# Patient Record
Sex: Female | Born: 1935 | Race: White | Hispanic: No | Smoking: Never smoker
Health system: Southern US, Community
[De-identification: ages and names within clinical notes are randomized; demographics above are authoritative.]

## PROBLEM LIST (undated history)

## (undated) ENCOUNTER — Emergency Department (HOSPITAL_COMMUNITY): Discharge status: Absent without leave | Payer: Medicare Other

## (undated) DIAGNOSIS — F329 Major depressive disorder, single episode, unspecified: Secondary | ICD-10-CM

## (undated) DIAGNOSIS — E119 Type 2 diabetes mellitus without complications: Secondary | ICD-10-CM

## (undated) DIAGNOSIS — I509 Heart failure, unspecified: Secondary | ICD-10-CM

## (undated) DIAGNOSIS — E7800 Pure hypercholesterolemia, unspecified: Secondary | ICD-10-CM

## (undated) DIAGNOSIS — N183 Chronic kidney disease, stage 3 unspecified: Secondary | ICD-10-CM

## (undated) DIAGNOSIS — IMO0002 Reserved for concepts with insufficient information to code with codable children: Secondary | ICD-10-CM

## (undated) DIAGNOSIS — I10 Essential (primary) hypertension: Secondary | ICD-10-CM

## (undated) DIAGNOSIS — K219 Gastro-esophageal reflux disease without esophagitis: Secondary | ICD-10-CM

## (undated) DIAGNOSIS — K8050 Calculus of bile duct without cholangitis or cholecystitis without obstruction: Secondary | ICD-10-CM

## (undated) DIAGNOSIS — I878 Other specified disorders of veins: Secondary | ICD-10-CM

## (undated) DIAGNOSIS — I5032 Chronic diastolic (congestive) heart failure: Secondary | ICD-10-CM

## (undated) DIAGNOSIS — N390 Urinary tract infection, site not specified: Secondary | ICD-10-CM

## (undated) DIAGNOSIS — F32A Depression, unspecified: Secondary | ICD-10-CM

## (undated) DIAGNOSIS — I050 Rheumatic mitral stenosis: Secondary | ICD-10-CM

## (undated) DIAGNOSIS — C50919 Malignant neoplasm of unspecified site of unspecified female breast: Secondary | ICD-10-CM

## (undated) DIAGNOSIS — M1990 Unspecified osteoarthritis, unspecified site: Secondary | ICD-10-CM

## (undated) DIAGNOSIS — J45909 Unspecified asthma, uncomplicated: Secondary | ICD-10-CM

## (undated) DIAGNOSIS — J189 Pneumonia, unspecified organism: Secondary | ICD-10-CM

## (undated) HISTORY — DX: Chronic diastolic (congestive) heart failure: I50.32

## (undated) HISTORY — DX: Chronic kidney disease, stage 3 unspecified: N18.30

## (undated) HISTORY — DX: Rheumatic mitral stenosis: I05.0

## (undated) HISTORY — PX: OTHER SURGICAL HISTORY: SHX169

## (undated) HISTORY — PX: REPLACEMENT TOTAL KNEE: SUR1224

## (undated) HISTORY — DX: Reserved for concepts with insufficient information to code with codable children: IMO0002

## (undated) HISTORY — DX: Unspecified asthma, uncomplicated: J45.909

## (undated) HISTORY — DX: Type 2 diabetes mellitus without complications: E11.9

## (undated) HISTORY — DX: Essential (primary) hypertension: I10

## (undated) HISTORY — DX: Other specified disorders of veins: I87.8

## (undated) HISTORY — PX: MASTECTOMY: SHX3

## (undated) HISTORY — PX: CAROTID ENDARTERECTOMY: SUR193

## (undated) HISTORY — PX: TOTAL HIP ARTHROPLASTY: SHX124

## (undated) HISTORY — DX: Chronic kidney disease, stage 3 (moderate): N18.3

---

## 2000-06-12 ENCOUNTER — Inpatient Hospital Stay (HOSPITAL_COMMUNITY)
Admission: RE | Admit: 2000-06-12 | Discharge: 2000-06-19 | Payer: Self-pay | Admitting: Physical Medicine & Rehabilitation

## 2000-11-26 ENCOUNTER — Encounter: Payer: Self-pay | Admitting: Obstetrics & Gynecology

## 2000-11-26 ENCOUNTER — Encounter (INDEPENDENT_AMBULATORY_CARE_PROVIDER_SITE_OTHER): Payer: Self-pay | Admitting: *Deleted

## 2000-11-26 ENCOUNTER — Encounter: Admission: RE | Admit: 2000-11-26 | Discharge: 2000-11-26 | Payer: Self-pay | Admitting: Obstetrics & Gynecology

## 2000-11-26 ENCOUNTER — Other Ambulatory Visit: Admission: RE | Admit: 2000-11-26 | Discharge: 2000-11-26 | Payer: Self-pay | Admitting: Obstetrics & Gynecology

## 2000-12-09 ENCOUNTER — Encounter: Payer: Self-pay | Admitting: General Surgery

## 2000-12-10 ENCOUNTER — Observation Stay (HOSPITAL_COMMUNITY): Admission: RE | Admit: 2000-12-10 | Discharge: 2000-12-11 | Payer: Self-pay | Admitting: General Surgery

## 2000-12-10 ENCOUNTER — Encounter (INDEPENDENT_AMBULATORY_CARE_PROVIDER_SITE_OTHER): Payer: Self-pay | Admitting: *Deleted

## 2000-12-30 ENCOUNTER — Ambulatory Visit (HOSPITAL_COMMUNITY): Admission: RE | Admit: 2000-12-30 | Discharge: 2000-12-30 | Payer: Self-pay | Admitting: Oncology

## 2000-12-30 ENCOUNTER — Encounter: Payer: Self-pay | Admitting: Oncology

## 2001-03-17 ENCOUNTER — Other Ambulatory Visit: Admission: RE | Admit: 2001-03-17 | Discharge: 2001-03-17 | Payer: Self-pay | Admitting: Family Medicine

## 2001-11-30 ENCOUNTER — Encounter: Admission: RE | Admit: 2001-11-30 | Discharge: 2001-11-30 | Payer: Self-pay | Admitting: *Deleted

## 2001-11-30 ENCOUNTER — Encounter: Payer: Self-pay | Admitting: *Deleted

## 2001-12-09 ENCOUNTER — Ambulatory Visit: Admission: RE | Admit: 2001-12-09 | Discharge: 2001-12-09 | Payer: Self-pay | Admitting: *Deleted

## 2002-03-01 ENCOUNTER — Encounter: Payer: Self-pay | Admitting: Family Medicine

## 2002-03-01 ENCOUNTER — Ambulatory Visit (HOSPITAL_COMMUNITY): Admission: RE | Admit: 2002-03-01 | Discharge: 2002-03-01 | Payer: Self-pay | Admitting: Family Medicine

## 2002-04-07 ENCOUNTER — Other Ambulatory Visit: Admission: RE | Admit: 2002-04-07 | Discharge: 2002-04-07 | Payer: Self-pay | Admitting: General Surgery

## 2003-05-10 ENCOUNTER — Encounter (HOSPITAL_COMMUNITY): Admission: RE | Admit: 2003-05-10 | Discharge: 2003-06-02 | Payer: Self-pay | Admitting: Oncology

## 2003-05-10 ENCOUNTER — Encounter: Admission: RE | Admit: 2003-05-10 | Discharge: 2003-05-10 | Payer: Self-pay | Admitting: Oncology

## 2003-06-21 ENCOUNTER — Encounter: Payer: Self-pay | Admitting: Family Medicine

## 2003-06-21 ENCOUNTER — Ambulatory Visit (HOSPITAL_COMMUNITY): Admission: RE | Admit: 2003-06-21 | Discharge: 2003-06-21 | Payer: Self-pay | Admitting: Family Medicine

## 2003-07-01 ENCOUNTER — Ambulatory Visit (HOSPITAL_COMMUNITY): Admission: RE | Admit: 2003-07-01 | Discharge: 2003-07-01 | Payer: Self-pay | Admitting: Family Medicine

## 2003-07-14 ENCOUNTER — Emergency Department (HOSPITAL_COMMUNITY): Admission: EM | Admit: 2003-07-14 | Discharge: 2003-07-14 | Payer: Self-pay | Admitting: Emergency Medicine

## 2003-07-25 ENCOUNTER — Ambulatory Visit (HOSPITAL_COMMUNITY): Admission: RE | Admit: 2003-07-25 | Discharge: 2003-07-25 | Payer: Self-pay | Admitting: Otolaryngology

## 2003-08-15 ENCOUNTER — Ambulatory Visit (HOSPITAL_COMMUNITY): Admission: RE | Admit: 2003-08-15 | Discharge: 2003-08-15 | Payer: Self-pay | Admitting: Otolaryngology

## 2003-09-07 ENCOUNTER — Encounter: Admission: RE | Admit: 2003-09-07 | Discharge: 2003-09-07 | Payer: Self-pay | Admitting: Family Medicine

## 2003-11-07 ENCOUNTER — Encounter (HOSPITAL_COMMUNITY): Admission: RE | Admit: 2003-11-07 | Discharge: 2003-12-07 | Payer: Self-pay | Admitting: Oncology

## 2003-11-07 ENCOUNTER — Encounter: Admission: RE | Admit: 2003-11-07 | Discharge: 2003-11-07 | Payer: Self-pay | Admitting: Oncology

## 2004-05-09 ENCOUNTER — Encounter (HOSPITAL_COMMUNITY): Admission: RE | Admit: 2004-05-09 | Discharge: 2004-06-01 | Payer: Self-pay | Admitting: Oncology

## 2004-05-09 ENCOUNTER — Encounter: Admission: RE | Admit: 2004-05-09 | Discharge: 2004-06-01 | Payer: Self-pay | Admitting: Oncology

## 2004-11-06 ENCOUNTER — Ambulatory Visit (HOSPITAL_COMMUNITY): Payer: Self-pay | Admitting: Oncology

## 2004-11-06 ENCOUNTER — Encounter: Admission: RE | Admit: 2004-11-06 | Discharge: 2004-11-06 | Payer: Self-pay | Admitting: Oncology

## 2004-11-06 ENCOUNTER — Encounter (HOSPITAL_COMMUNITY): Admission: RE | Admit: 2004-11-06 | Discharge: 2004-12-06 | Payer: Self-pay | Admitting: Oncology

## 2005-01-29 ENCOUNTER — Ambulatory Visit (HOSPITAL_COMMUNITY): Admission: RE | Admit: 2005-01-29 | Discharge: 2005-01-29 | Payer: Self-pay | Admitting: Family Medicine

## 2005-05-07 ENCOUNTER — Encounter (HOSPITAL_COMMUNITY): Admission: RE | Admit: 2005-05-07 | Discharge: 2005-05-31 | Payer: Self-pay | Admitting: Oncology

## 2005-05-07 ENCOUNTER — Ambulatory Visit (HOSPITAL_COMMUNITY): Payer: Self-pay | Admitting: Oncology

## 2005-05-07 ENCOUNTER — Encounter: Admission: RE | Admit: 2005-05-07 | Discharge: 2005-05-31 | Payer: Self-pay | Admitting: Oncology

## 2005-11-06 ENCOUNTER — Ambulatory Visit (HOSPITAL_COMMUNITY): Payer: Self-pay | Admitting: Oncology

## 2005-11-06 ENCOUNTER — Encounter (HOSPITAL_COMMUNITY): Admission: RE | Admit: 2005-11-06 | Discharge: 2005-12-06 | Payer: Self-pay | Admitting: Oncology

## 2005-11-06 ENCOUNTER — Encounter: Admission: RE | Admit: 2005-11-06 | Discharge: 2005-11-06 | Payer: Self-pay | Admitting: Oncology

## 2006-07-31 ENCOUNTER — Ambulatory Visit (HOSPITAL_COMMUNITY): Admission: RE | Admit: 2006-07-31 | Discharge: 2006-07-31 | Payer: Self-pay | Admitting: Family Medicine

## 2006-11-05 ENCOUNTER — Encounter (HOSPITAL_COMMUNITY): Admission: RE | Admit: 2006-11-05 | Discharge: 2006-12-05 | Payer: Self-pay | Admitting: Oncology

## 2006-11-05 ENCOUNTER — Ambulatory Visit (HOSPITAL_COMMUNITY): Payer: Self-pay | Admitting: Oncology

## 2007-02-06 ENCOUNTER — Ambulatory Visit: Payer: Self-pay | Admitting: Vascular Surgery

## 2007-02-06 ENCOUNTER — Ambulatory Visit (HOSPITAL_COMMUNITY): Admission: RE | Admit: 2007-02-06 | Discharge: 2007-02-06 | Payer: Self-pay | Admitting: Family Medicine

## 2007-02-07 ENCOUNTER — Inpatient Hospital Stay (HOSPITAL_COMMUNITY): Admission: EM | Admit: 2007-02-07 | Discharge: 2007-02-11 | Payer: Self-pay | Admitting: Emergency Medicine

## 2007-02-09 ENCOUNTER — Encounter: Payer: Self-pay | Admitting: Vascular Surgery

## 2007-03-13 ENCOUNTER — Ambulatory Visit: Payer: Self-pay | Admitting: Vascular Surgery

## 2007-04-03 ENCOUNTER — Ambulatory Visit: Payer: Self-pay | Admitting: Vascular Surgery

## 2007-04-03 ENCOUNTER — Encounter: Admission: RE | Admit: 2007-04-03 | Discharge: 2007-04-03 | Payer: Self-pay | Admitting: Vascular Surgery

## 2007-10-14 ENCOUNTER — Ambulatory Visit: Payer: Self-pay | Admitting: Vascular Surgery

## 2007-11-25 ENCOUNTER — Ambulatory Visit (HOSPITAL_COMMUNITY): Admission: RE | Admit: 2007-11-25 | Discharge: 2007-11-25 | Payer: Self-pay | Admitting: Family Medicine

## 2007-12-01 ENCOUNTER — Ambulatory Visit (HOSPITAL_COMMUNITY): Payer: Self-pay | Admitting: Oncology

## 2007-12-01 ENCOUNTER — Encounter (HOSPITAL_COMMUNITY): Admission: RE | Admit: 2007-12-01 | Discharge: 2007-12-31 | Payer: Self-pay | Admitting: Oncology

## 2008-03-18 ENCOUNTER — Ambulatory Visit (HOSPITAL_COMMUNITY): Admission: RE | Admit: 2008-03-18 | Discharge: 2008-03-18 | Payer: Self-pay | Admitting: Family Medicine

## 2008-10-12 ENCOUNTER — Ambulatory Visit: Payer: Self-pay | Admitting: Vascular Surgery

## 2009-01-23 ENCOUNTER — Ambulatory Visit (HOSPITAL_COMMUNITY): Payer: Self-pay | Admitting: Oncology

## 2009-04-19 ENCOUNTER — Ambulatory Visit: Payer: Self-pay | Admitting: Vascular Surgery

## 2009-08-24 ENCOUNTER — Ambulatory Visit (HOSPITAL_COMMUNITY): Admission: RE | Admit: 2009-08-24 | Discharge: 2009-08-24 | Payer: Self-pay | Admitting: Family Medicine

## 2009-09-22 ENCOUNTER — Encounter: Admission: RE | Admit: 2009-09-22 | Discharge: 2009-12-21 | Payer: Self-pay | Admitting: Ophthalmology

## 2010-01-22 ENCOUNTER — Encounter (HOSPITAL_COMMUNITY): Admission: RE | Admit: 2010-01-22 | Discharge: 2010-02-21 | Payer: Self-pay | Admitting: Oncology

## 2010-01-22 ENCOUNTER — Ambulatory Visit (HOSPITAL_COMMUNITY): Payer: Self-pay | Admitting: Oncology

## 2010-06-05 ENCOUNTER — Ambulatory Visit (HOSPITAL_COMMUNITY)
Admission: RE | Admit: 2010-06-05 | Discharge: 2010-06-05 | Payer: Self-pay | Source: Home / Self Care | Admitting: Family Medicine

## 2010-09-05 ENCOUNTER — Inpatient Hospital Stay (HOSPITAL_COMMUNITY)
Admission: AD | Admit: 2010-09-05 | Discharge: 2010-09-12 | Payer: Self-pay | Source: Home / Self Care | Attending: Family Medicine | Admitting: Family Medicine

## 2010-09-05 LAB — COMPREHENSIVE METABOLIC PANEL
ALT: 13 U/L (ref 0–35)
AST: 23 U/L (ref 0–37)
Albumin: 3.6 g/dL (ref 3.5–5.2)
Alkaline Phosphatase: 84 U/L (ref 39–117)
BUN: 29 mg/dL — ABNORMAL HIGH (ref 6–23)
CO2: 29 mEq/L (ref 19–32)
Calcium: 9.4 mg/dL (ref 8.4–10.5)
Chloride: 103 mEq/L (ref 96–112)
Creatinine, Ser: 1.26 mg/dL — ABNORMAL HIGH (ref 0.4–1.2)
GFR calc Af Amer: 49 mL/min — ABNORMAL LOW (ref >60)
GFR calc non Af Amer: 40 mL/min — ABNORMAL LOW (ref >60)
Glucose, Bld: 123 mg/dL — ABNORMAL HIGH (ref 70–99)
Potassium: 4.8 mEq/L (ref 3.5–5.1)
Sodium: 141 mEq/L (ref 135–145)
Total Bilirubin: 0.6 mg/dL (ref 0.3–1.2)
Total Protein: 7.1 g/dL (ref 6.0–8.3)

## 2010-09-05 LAB — URINALYSIS, ROUTINE W REFLEX MICROSCOPIC
Bilirubin Urine: NEGATIVE
Hemoglobin, Urine: NEGATIVE
Ketones, ur: NEGATIVE mg/dL
Leukocytes, UA: NEGATIVE
Nitrite: POSITIVE — AB
Protein, ur: NEGATIVE mg/dL
Specific Gravity, Urine: 1.01 (ref 1.005–1.030)
Urine Glucose, Fasting: NEGATIVE mg/dL
Urobilinogen, UA: 0.2 mg/dL (ref 0.0–1.0)
pH: 7 (ref 5.0–8.0)

## 2010-09-05 LAB — CBC
HCT: 34 % — ABNORMAL LOW (ref 36.0–46.0)
Hemoglobin: 11.3 g/dL — ABNORMAL LOW (ref 12.0–15.0)
MCH: 29.4 pg (ref 26.0–34.0)
MCHC: 33.2 g/dL (ref 30.0–36.0)
MCV: 88.5 fL (ref 78.0–100.0)
Platelets: 218 10*3/uL (ref 150–400)
RBC: 3.84 MIL/uL — ABNORMAL LOW (ref 3.87–5.11)
RDW: 14.5 % (ref 11.5–15.5)
WBC: 9.2 10*3/uL (ref 4.0–10.5)

## 2010-09-05 LAB — URINE MICROSCOPIC-ADD ON

## 2010-09-05 LAB — DIFFERENTIAL
Basophils Absolute: 0.1 10*3/uL (ref 0.0–0.1)
Basophils Relative: 1 % (ref 0–1)
Eosinophils Absolute: 0.5 10*3/uL (ref 0.0–0.7)
Eosinophils Relative: 6 % — ABNORMAL HIGH (ref 0–5)
Lymphocytes Relative: 20 % (ref 12–46)
Lymphs Abs: 1.9 10*3/uL (ref 0.7–4.0)
Monocytes Absolute: 0.9 10*3/uL (ref 0.1–1.0)
Monocytes Relative: 9 % (ref 3–12)
Neutro Abs: 5.9 10*3/uL (ref 1.7–7.7)
Neutrophils Relative %: 64 % (ref 43–77)

## 2010-09-06 ENCOUNTER — Other Ambulatory Visit: Payer: Self-pay | Admitting: Family Medicine

## 2010-09-06 LAB — BASIC METABOLIC PANEL
BUN: 28 mg/dL — ABNORMAL HIGH (ref 6–23)
CO2: 26 mEq/L (ref 19–32)
Calcium: 8.9 mg/dL (ref 8.4–10.5)
Chloride: 105 mEq/L (ref 96–112)
Creatinine, Ser: 1.28 mg/dL — ABNORMAL HIGH (ref 0.4–1.2)
GFR calc Af Amer: 48 mL/min — ABNORMAL LOW (ref >60)
GFR calc non Af Amer: 40 mL/min — ABNORMAL LOW (ref >60)
Glucose, Bld: 129 mg/dL — ABNORMAL HIGH (ref 70–99)
Potassium: 4 mEq/L (ref 3.5–5.1)
Sodium: 139 mEq/L (ref 135–145)

## 2010-09-06 LAB — COMPREHENSIVE METABOLIC PANEL
ALT: 14 U/L (ref 0–35)
AST: 19 U/L (ref 0–37)
Albumin: 3.3 g/dL — ABNORMAL LOW (ref 3.5–5.2)
Alkaline Phosphatase: 67 U/L (ref 39–117)
BUN: 30 mg/dL — ABNORMAL HIGH (ref 6–23)
CO2: 26 mEq/L (ref 19–32)
Calcium: 8.9 mg/dL (ref 8.4–10.5)
Chloride: 105 mEq/L (ref 96–112)
Creatinine, Ser: 1.34 mg/dL — ABNORMAL HIGH (ref 0.4–1.2)
GFR calc Af Amer: 45 mL/min — ABNORMAL LOW (ref >60)
GFR calc non Af Amer: 38 mL/min — ABNORMAL LOW (ref >60)
Glucose, Bld: 118 mg/dL — ABNORMAL HIGH (ref 70–99)
Potassium: 4 mEq/L (ref 3.5–5.1)
Sodium: 141 mEq/L (ref 135–145)
Total Bilirubin: 0.9 mg/dL (ref 0.3–1.2)
Total Protein: 6.4 g/dL (ref 6.0–8.3)

## 2010-09-06 LAB — BRAIN NATRIURETIC PEPTIDE: Pro B Natriuretic peptide (BNP): 80.3 pg/mL (ref 0.0–100.0)

## 2010-09-07 LAB — BASIC METABOLIC PANEL
BUN: 26 mg/dL — ABNORMAL HIGH (ref 6–23)
CO2: 27 mEq/L (ref 19–32)
Calcium: 8.8 mg/dL (ref 8.4–10.5)
Chloride: 103 mEq/L (ref 96–112)
Creatinine, Ser: 1.21 mg/dL — ABNORMAL HIGH (ref 0.4–1.2)
GFR calc Af Amer: 51 mL/min — ABNORMAL LOW (ref >60)
GFR calc non Af Amer: 42 mL/min — ABNORMAL LOW (ref >60)
Glucose, Bld: 146 mg/dL — ABNORMAL HIGH (ref 70–99)
Potassium: 3.7 mEq/L (ref 3.5–5.1)
Sodium: 139 mEq/L (ref 135–145)

## 2010-09-07 LAB — URINE CULTURE
Colony Count: >=100000
Culture  Setup Time: 201201041340

## 2010-09-17 LAB — URINE CULTURE
Colony Count: >=100000
Colony Count: >=100000
Culture  Setup Time: 201201060146
Culture  Setup Time: 201201082212
Special Requests: NEGATIVE
Special Requests: NEGATIVE

## 2010-09-17 LAB — BASIC METABOLIC PANEL
BUN: 26 mg/dL — ABNORMAL HIGH (ref 6–23)
BUN: 38 mg/dL — ABNORMAL HIGH (ref 6–23)
BUN: 43 mg/dL — ABNORMAL HIGH (ref 6–23)
CO2: 28 mEq/L (ref 19–32)
CO2: 25 mEq/L (ref 19–32)
CO2: 26 mEq/L (ref 19–32)
Calcium: 8.7 mg/dL (ref 8.4–10.5)
Calcium: 8.5 mg/dL (ref 8.4–10.5)
Calcium: 8 mg/dL — ABNORMAL LOW (ref 8.4–10.5)
Chloride: 102 mEq/L (ref 96–112)
Chloride: 103 mEq/L (ref 96–112)
Chloride: 101 mEq/L (ref 96–112)
Creatinine, Ser: 1.25 mg/dL — ABNORMAL HIGH (ref 0.4–1.2)
Creatinine, Ser: 1.49 mg/dL — ABNORMAL HIGH (ref 0.4–1.2)
Creatinine, Ser: 1.62 mg/dL — ABNORMAL HIGH (ref 0.4–1.2)
GFR calc Af Amer: 49 mL/min — ABNORMAL LOW (ref >60)
GFR calc Af Amer: 40 mL/min — ABNORMAL LOW (ref >60)
GFR calc Af Amer: 36 mL/min — ABNORMAL LOW (ref >60)
GFR calc non Af Amer: 41 mL/min — ABNORMAL LOW (ref >60)
GFR calc non Af Amer: 33 mL/min — ABNORMAL LOW (ref >60)
GFR calc non Af Amer: 30 mL/min — ABNORMAL LOW (ref >60)
Glucose, Bld: 158 mg/dL — ABNORMAL HIGH (ref 70–99)
Glucose, Bld: 142 mg/dL — ABNORMAL HIGH (ref 70–99)
Glucose, Bld: 104 mg/dL — ABNORMAL HIGH (ref 70–99)
Potassium: 3.9 mEq/L (ref 3.5–5.1)
Potassium: 3.8 mEq/L (ref 3.5–5.1)
Potassium: 3.5 mEq/L (ref 3.5–5.1)
Sodium: 138 mEq/L (ref 135–145)
Sodium: 138 mEq/L (ref 135–145)
Sodium: 135 mEq/L (ref 135–145)

## 2010-09-17 LAB — URINALYSIS, ROUTINE W REFLEX MICROSCOPIC
Bilirubin Urine: NEGATIVE
Ketones, ur: NEGATIVE mg/dL
Leukocytes, UA: NEGATIVE
Nitrite: NEGATIVE
Protein, ur: NEGATIVE mg/dL
Specific Gravity, Urine: 1.01 (ref 1.005–1.030)
Urine Glucose, Fasting: NEGATIVE mg/dL
Urobilinogen, UA: 0.2 mg/dL (ref 0.0–1.0)
pH: 8 (ref 5.0–8.0)

## 2010-09-17 LAB — CULTURE, BLOOD (ROUTINE X 2)
Culture: NO GROWTH
Culture: NO GROWTH

## 2010-09-17 LAB — STOOL CULTURE

## 2010-09-17 LAB — URINE MICROSCOPIC-ADD ON

## 2010-09-17 LAB — FECAL LACTOFERRIN, QUANT: Fecal Lactoferrin: POSITIVE

## 2010-09-17 LAB — CLOSTRIDIUM DIFFICILE BY PCR

## 2010-11-19 LAB — COMPREHENSIVE METABOLIC PANEL
Alkaline Phosphatase: 75 U/L (ref 39–117)
BUN: 37 mg/dL — ABNORMAL HIGH (ref 6–23)
Chloride: 104 mEq/L (ref 96–112)
Glucose, Bld: 105 mg/dL — ABNORMAL HIGH (ref 70–99)
Potassium: 4 mEq/L (ref 3.5–5.1)
Total Bilirubin: 0.9 mg/dL (ref 0.3–1.2)

## 2010-11-19 LAB — TSH: TSH: 3.41 u[IU]/mL (ref 0.350–4.500)

## 2010-11-19 LAB — DIFFERENTIAL
Basophils Absolute: 0.1 10*3/uL (ref 0.0–0.1)
Basophils Relative: 1 % (ref 0–1)
Neutro Abs: 5.7 10*3/uL (ref 1.7–7.7)
Neutrophils Relative %: 61 % (ref 43–77)

## 2010-11-19 LAB — CBC
HCT: 35.2 % — ABNORMAL LOW (ref 36.0–46.0)
Hemoglobin: 12.1 g/dL (ref 12.0–15.0)
MCV: 88.3 fL (ref 78.0–100.0)
RDW: 14.7 % (ref 11.5–15.5)

## 2010-11-19 LAB — SEDIMENTATION RATE: Sed Rate: 38 mm/hr — ABNORMAL HIGH (ref 0–22)

## 2011-01-15 NOTE — Assessment & Plan Note (Signed)
OFFICE VISIT   HOANG, REICH  DOB:  [birth year]                                       03/13/2007  CHART#:[id number]   Ms. Cragun returns for followup today after right carotid  endarterectomy on February 07, 2007. Since that time her daughter reports  that she has been very lethargic and sleepy during the day. She  sometimes has a dazed look on her face. She otherwise has no focal  neurologic findings. She has no weakness, numbness in her arms or legs.  She has had no episodes of amaurosis. She has had some leg edema and has  been placed on Lasix by Dr. Renard Matter.   On conversation with her today she is somewhat slow to respond to  commands on occasion and seems like things take awhile to register with  her.   On physical exam, blood pressure is 184/72, heart rate is 54 and  regular. Neck incision is well-healed. She has 2 + carotid pulses  without bruits. Upper extremity and lower extremity have 5/5 motor  strength bilaterally. Sensation is intact. Tongue is midline and other  cranial nerves appear intact as well. Patient is alert and oriented  otherwise. Overall Ms. Shaddock seems to have normal motor and sensory  function. However her cognitive function does seem a little bit slow.  Hopefully this will continue to improve with time, as it seems to have  improved at least over the last few weeks. She will follow up with me in  2 weeks time. We will obtain a CT scan of the  head at that time to make sure that she has had no new stroke. I am  doubtful that she has since she really does not have focal deficits and  seems to have more a global slowness.   Janetta Hora. Fields, MD  Electronically Signed   CEF/MEDQ  D:  03/13/2007  T:  03/16/2007  Job:  [id number]   cc:   Ramon Dredge L. Juanetta Gosling, M.D.  Angus G. Renard Matter, MD  Cassell Clement, M.D.

## 2011-01-15 NOTE — Assessment & Plan Note (Signed)
OFFICE VISIT   [name], [name]  DOB:  10-01-1921                                       10/12/2008  XBMWU#:[id number]   The patient returns for followup today.  She underwent right carotid  endarterectomy in June of 2008.  She was last seen February of 2009.  She has completely recovered from her carotid endarterectomy.  She  denies any symptoms of TIA, amaurosis or stroke.  She continues to take  81 mg of aspirin once daily.  Her atherosclerotic risk factors include  elevated cholesterol and hypertension.   PHYSICAL EXAM:  Today blood pressure is 172/84 in the left arm, 174/90  in the right arm, pulse is 88 and regular.  HEENT:  Unremarkable.  Neck:  Has 2+ carotid pulses with bilateral carotid bruits.  Chest:  Clear to  auscultation.  Heart:  Regular rate and rhythm.  Abdomen:  Obese, soft,  nontender, nondistended with no masses.  Extremities:  She has 2+ radial  pulses bilaterally.  She has no lower extremity edema.   She had a carotid duplex exam today which showed the right carotid  endarterectomy is widely patent with no restenosis.  She has a 40-60%  left internal carotid artery stenosis.  This is essentially unchanged  from February of 2009.   Overall the patient is doing well.  She will have followup carotid  duplex exam in 1 year's time.  She will continue to take the aspirin.  She will continue to monitor her hypertension and elevated cholesterol.   Janetta Hora. Fields, MD  Electronically Signed   CEF/MEDQ  D:  10/12/2008  T:  10/13/2008  Job:  [id number]   cc:   Angus G. Renard Matter, MD  Oneal Deputy Juanetta Gosling, M.D.  Cassell Clement, M.D.

## 2011-01-15 NOTE — Group Therapy Note (Signed)
NAMELONDYN, HOTARD           ACCOUNT NO.:  [id number]   MEDICAL RECORD NO.:  [id number]          PATIENT TYPE:  OUT   LOCATION:  RAD                           FACILITY:  [facility]   PHYSICIAN:  Edward L. Juanetta Gosling, M.D.DATE OF BIRTH:  [birth year]   DATE OF PROCEDURE:  DATE OF DISCHARGE:                                 PROGRESS NOTE   Patient of Dr. Renard Matter.  Ms. Dillenbeck is an 77 year old who had an  episode of amaurosis fugax on Monday of this week, that would be the  second.  She call Dr. Renard Matter' offices, he referred her to Dr. Alden Hipp,  the ophthalmologist in [place], who saw Ms. Dullea and felt that  she had amaurosis fugax, and referred her back to Dr. Renard Matter.  She saw  Dr. Renard Matter today, was placed on Plavix, and had a carotid study done.  Her carotid study is very abnormal.  I received a call from Dr. Rosalie Gums in the radiology department about his carotid study.  I went to  the emergency room to discuss this with Ms. Galas.  Her studies  showed significant stenosis of the right internal carotid 80-99%, left  internal 60-80% retrograde flow in the left vertebral.  Ms. Villalona  has had no symptoms since Monday and she and her husband want to go  home.  I discussed her situation with Dr. Fabienne Bruns of [facility] and we  discussed the possibility of transferring her directly to [facility] but  he felt that since she has had no symptoms and is on Plavix that she can  go home to return here immediately if she has any more symptoms and be  admitted to [facility] in the morning.  I discussed overnight admission with  the patient and her husband and they do not want to do that.  Considering that, at this point the plan is to have her continue Plavix,  she just started today; go to [facility] in the morning to be admitted  by Dr. Darrick Penna with potential for surgery soon; return here if there is  on any more problems.  Her physical examination today shows that she is  neurologically intact.  She does have a history of hypertension and her  family history is positive for hardening of the arteries.  Her husband  also says that she has had some more problem with memory recently.      Edward L. Juanetta Gosling, M.D.  Electronically Signed     ELH/MEDQ  D:  02/06/2007  T:  02/07/2007  Job:  [id number]

## 2011-01-15 NOTE — Discharge Summary (Signed)
NAMEBRIER, FIREBAUGH           ACCOUNT NO.:  [id number]   MEDICAL RECORD NO.:  [id number]          PATIENT TYPE:  INP   LOCATION:  2035                         FACILITY:  [facility]   PHYSICIAN:  Janetta Hora. Fields, MD  DATE OF BIRTH:  1921-09-30   DATE OF ADMISSION:  02/07/2007  DATE OF DISCHARGE:                               DISCHARGE SUMMARY   ANTICIPATED DATE OF DISCHARGE:  June 11 or February 12, 2007.   ADMISSION DIAGNOSIS:  Symptomatic right internal carotid artery  stenosis.   DISCHARGE/SECONDARY DIAGNOSES:  1. Symptomatic right internal carotid artery stenosis.  Status post      right carotid endarterectomy.  2. History of breast cancer.  Status post left mastectomy.  3. Gastroesophageal reflux disease.  4. History of left total hip replacement.  5. Hyperlipidemia.  6. Hypertension.   ALLERGIES:  ERYTHROMYCIN, CELECOXIB, AND PENICILLIN.  REQUIRES  PREMEDICATION FOR IV DYE SECONDARY TO INTOLERANCE DUE TO HEADACHES.   PROCEDURES:  February 09, 2007 right carotid endarterectomy with Dacron Patch  angioplasty by Dr. Fabienne Bruns.   BRIEF HISTORY:  Miss Plazola had complaints of right eye neurosis x5  minutes on February 02, 2007.  It appeared as shadows in her right eye and  then cleared.  She had no prior history of CVA or TIA.  As part of her  workup, she underwent a carotid duplex which showed greater than 80%  right internal carotid artery stenosis and 60 to 80% left carotid artery  stenosis with left vertebral flow retrograde.  Apparently when she first  had her symptoms of amaurosis fugax, she called Dr. Renard Matter' office.  He  referred her to Dr. Alden Hipp, an ophthalmologist, who felt this was not a  ophthalmology issue, but rather amaurosis fugax, and referred her back  to Dr. Renard Matter.  She was placed on Plavix and had a carotid study with  results as discussed above.  Dr. Juanetta Gosling apparently was called with the  results of her abnormal carotid study on February 06, 2007 and saw her  in the  emergency department.  She was having no symptoms at that time.  He  called Dr. Fabienne Bruns who discussed the possibility of transferring  her to [facility].  However, Dr. Darrick Penna felt that since she had  no symptoms and was on Plavix, that she could go home and return to the  emergency department immediately if symptoms reoccurred.  Otherwise, he  will plan on admitting her to [facility] the following day on  February 07, 2007.   HOSPITAL COURSE:  Miss Birdwell was admitted to [facility] on  February 07, 2007.  Dr. Darrick Penna ordered a CT angio to further delineate her  anatomy.  She did request premedication as she had a headache after a  prior CT angio.  The results showed 85% proximal right internal carotid  artery stenosis, 75 to 80% left internal carotid artery stenosis.  Radiology results as expected.  A slow reducing lesion in the proximal  right subclavian with heavily calcified plaque, although the exact  degree of stenosis was not established.  Of note, the  patient was placed  on heparin drip on admission and continued this through midnight before  surgery.  Based on the findings, Dr. Darrick Penna did recommend that she  undergo a right carotid endarterectomy.  The patient agreed to proceed  and on February 09, 2007 she was taken to the operating room.  Postoperatively she was extubated and neurologically intact, and after a  short stay in recovery she was transferred to step-down unit 3300 where  she remained for the first 24 hours.  She remained stable overnight,  although it appears she initially received some dopamine while in the  recovery unit.  Blood pressure on morning rounds was 107/26, she was  afebrile, heart rate was in the 70s and 80s and in sinus rhythm, oxygen  saturations were 92% on room air.  She denied visual changes and  dysphagia.  She was quite drowsy and weak, and subsequently felt  unsteady initially to do much mobilization.  She was able  to answer  questions appropriately and other than being drowsy, was felt  neurologically intact.   EXAMINATION:  HEART:  Regular rate and rhythm.  LUNGS:  She has faint wheezes at the bases, but were otherwise clear.  ABDOMINAL EXAM:  Benign.   Incision was clean, dry, and intact without evidence of hematoma.  Because the patient was lethargic, it was felt she should remain  hospitalized for at least one additional day.  Of note, she had reported  that she had little sleep that night, and it was felt that this was a  contributing factor of her drowsiness as well as her pain medication.  Because the nursing staff felt she was unsteady, physical therapy  consult was also ordered, as well as a walker for home.  She was started  on heparin subcutaneously for DVT prophylaxis.  If the patient can  remain stable and drowsiness has resolved, and is making progress with  mobility, they anticipate she will be discharged home in the next 24 to  48 hours, June 11 or February 12, 2007.  We will consider home health  physical therapy consult pending PT recommendations.   DISCHARGE MEDICATIONS:  1. Lipitor 20 mg one-half tablet p.o. daily.  2. Tekturna 150 mg daily.  3. Nexium 40 mg b.i.d.  4. Lorazepam 0.5 mg every fours hours p.r.n.  5. Diovan 320/25 mg daily.  6. Metoprolol 100 mg daily.  7. Paxil 20 mg daily.  8. Mucinex b.i.d.  9. Tylox 1 to 2 tablets p.o. every four hours p.r.n. pain.   DISCHARGE INSTRUCTIONS:  She is to continue a heart-healthy diet.  Increase her activity slowly.  Avoid driving or heavy lifting for the  next 3 weeks.  She may shower and clean her incision gently with soap  and water.  She is to call if she develops a fever greater than 101,  redness or drainage from her incision site.  She will follow up with Dr.  Darrick Penna in approximately 2 to 3 weeks.  Our office will contact her regarding specific appointment date and time.  She will also be given  instructions whether  she should resume her Plavix prior to discharge.   LABORATORY DATA:  Postop labs show a white blood count of 14.1,  hemoglobin 10.8, hematocrit 31.9, platelet count 189.  Sodium 132,  potassium 4.1, BUN 19, creatinine 1.07, blood glucose 154.      Jerold Coombe, P.A.      Janetta Hora. Fields, MD  Electronically Signed  AWZ/MEDQ  D:  02/10/2007  T:  02/10/2007  Job:  [id number]   cc:   Ramon Dredge L. Juanetta Gosling, M.D.  Angus G. Renard Matter, MD  Cassell Clement, M.D.

## 2011-01-15 NOTE — Assessment & Plan Note (Signed)
OFFICE VISIT   [name], [name]  DOB:  03/15/1922                                       10/14/2007  ZOXWR#:[id number]   The patient returns for followup today.  She underwent right carotid  endarterectomy in June 2008.  She was last seen in August 2008.  She had  some lethargy postoperatively, but this has now all completely resolved.   EXAM:  She is alert and oriented x3.  She has had no episodes of  neurological dysfunction of her upper or lower extremities.  Blood  pressure today is 158/68, pulse 62 and regular.  She has no carotid  bruits.  She has a well-healed right neck incision.  Chest is clear to  auscultation.  Cardiac exam is regular rate and rhythm without murmur.  Upper extremity and lower extremity motor strength is 5/5 bilaterally.  She continues to take aspirin.  She was also apparently recently placed  on Plavix for what sounds like some lower extremity occlusive disease.  On questioning her regarding claudication symptoms, she essentially has  none or very mild symptoms.  I do not believe this warrants further  workup currently.  She will see me again in 1-year's time for repeat  carotid duplex exam or sooner if she develops any neurologic symptoms.   Janetta Hora. Fields, MD  Electronically Signed   CEF/MEDQ  D:  10/14/2007  T:  10/16/2007  Job:  [id number]   cc:   Ramon Dredge L. Juanetta Gosling, M.D.  Angus G. Renard Matter, MD  Cassell Clement, M.D.

## 2011-01-15 NOTE — Procedures (Signed)
CAROTID DUPLEX EXAM   INDICATION:  Followup right carotid endarterectomy.   HISTORY:  Diabetes:  No.  Cardiac:  No.  Hypertension:  Yes.  Smoking:  Previous.  Previous Surgery:  Right carotid endarterectomy on 02/07/2007.  CV History:  Currently asymptomatic.  Amaurosis Fugax No, Paresthesias No, Hemiparesis No                                       RIGHT             LEFT  Brachial systolic pressure:         170               166  Brachial Doppler waveforms:         Normal            Normal  Vertebral direction of flow:        Antegrade         Antegrade  DUPLEX VELOCITIES (cm/sec)  CCA peak systolic                   63                85  ECA peak systolic                   86                74  ICA peak systolic                   101               165  ICA end diastolic                   11                26  PLAQUE MORPHOLOGY:                                    Mixed  PLAQUE AMOUNT:                      None              Moderate  PLAQUE LOCATION:                                      ICA   IMPRESSION:  1. Patent right carotid endarterectomy site with no evidence of right      internal carotid artery stenosis.  2. 40-59% stenosis of the left internal carotid artery.  3. No significant change noted when compared to the previous exam on      10/14/2007.   ___________________________________________  Janetta Hora. Fields, MD   CH/MEDQ  D:  10/13/2008  T:  10/13/2008  Job:  [id number]

## 2011-01-15 NOTE — Assessment & Plan Note (Signed)
OFFICE VISIT   [name], [name]  DOB:  Feb 22, 1922                                       04/19/2009  [id number]#:[id number]   The patient is an 77 year old female who returns for followup today.  She previously underwent right carotid endarterectomy in June of 2008.  She returns to the office today with complaints of decreased vision in  her left eye.  The patient is hard of hearing and had some lack of  understanding of why her left visual acuity was decreasing at a recent  eye doctor appointment.  Therefore, since she had previously had  amaurosis type symptoms for her right carotid endarterectomy her  daughter thought it was wise to bring her in for evaluation.   On questioning of the patient, the daughter and the husband there have  been no obvious episodes of amaurosis.  According to the patient she has  had no neurologic symptoms or episodes of transient visual loss.  She  does report that her left eye visual acuity has decreased over time but  this has not been very acute in onset.   Atherosclerotic risk factors include hyperlipidemia, hypertension and  age.   PHYSICAL EXAM:  Today blood pressure is 187/92 in the left arm, 192/75  in the right arm, pulse is 72 and regular.  HEENT:  Unremarkable.  On  objective testing of the eyes she has at least gross visual acuity in  both eyes and is able to identify large objects.  Extraocular movements  were intact.  Neck:  Has 2+ carotid pulses without bruit.  She has a  well-healed neck scar from previous carotid endarterectomy on the right  side.  Chest:  Clear to auscultation.  Heart:  Regular rate and rhythm  without murmur.  She has 2+ radial pulses bilaterally.   She had a carotid duplex scan today which showed a widely patent carotid  endarterectomy site on the right side.  She has a 40-60% carotid  stenosis on the left side which is essentially unchanged from February  of 2010.  We were able to get a  fax report of her recent eye evaluation  at [facility] dated 12/06/2008.  This showed that she does have  some decreased visual acuity on the left side.  However, there was no  obvious mention of atherosclerotic or Hollenhorst type plaques and this  appeared to mainly be secondary to macular scarring.   In summary, the patient has a moderate left internal carotid artery  stenosis.  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  She does not have symptoms  consistent with TIA or amaurosis.  I believe the best management for  this is continued observation with serial ultrasound as well as  continued management of her atherosclerotic risk factors.  This was all  discussed with the family at length.  They will call Dr. Nile Riggs if they  have further concerns regarding her left eye.  They will follow up with  [place] in our continued carotid surveillance protocol.   Janetta Hora. Fields, MD  Electronically Signed   CEF/MEDQ  D:  04/19/2009  T:  04/20/2009  Job:  [id number]   cc:   Angus G. Renard Matter, MD  Oneal Deputy Juanetta Gosling, M.D.  Cassell Clement, M.D.

## 2011-01-15 NOTE — Procedures (Signed)
CAROTID DUPLEX EXAM   INDICATION:  Follow-up evaluation of known carotid artery disease.   HISTORY:  Diabetes:  No.  Cardiac:  No.  Hypertension:  Yes.  Smoking:  Remote history of tobacco use.  Previous Surgery:  Right carotid endarterectomy on 02/07/07.  CV History:  Patient had episodes of amaurosis fugax prior to her  endarterectomy but not since.  Amaurosis Fugax No, Paresthesias No, Hemiparesis No                                       RIGHT             LEFT  Brachial systolic pressure:         164               160  Brachial Doppler waveforms:         Triphasic         Triphasic  Vertebral direction of flow:        Antegrade         Antegrade  DUPLEX VELOCITIES (cm/sec)  CCA peak systolic                   81                94  ECA peak systolic                   102               71  ICA peak systolic                   117               159  ICA end diastolic                   16                31  PLAQUE MORPHOLOGY:                  None              Calcified  PLAQUE AMOUNT:                      None              Moderate  PLAQUE LOCATION:                    None              Proximal ICA   IMPRESSION:  1. No right internal carotid artery stenosis, status post      endarterectomy.  2. 40-59% left internal carotid artery stenosis.   ___________________________________________  Janetta Hora Fields, MD   MC/MEDQ  D:  10/14/2007  T:  10/15/2007  Job:  [id number]

## 2011-01-15 NOTE — Procedures (Signed)
CAROTID DUPLEX EXAM   INDICATION:  Followup carotid artery disease.   HISTORY:  Diabetes:  No.  Cardiac:  No.  Hypertension:  Yes.  Smoking:  Previous.  Previous Surgery:  Right CEA 02/07/2007.  CV History:  Left eye blindness.  Amaurosis Fugax No, Paresthesias No, Hemiparesis No                                       RIGHT             LEFT  Brachial systolic pressure:         224               220  Brachial Doppler waveforms:         WNL               WNL  Vertebral direction of flow:        Antegrade         Antegrade  DUPLEX VELOCITIES (cm/sec)  CCA peak systolic                   85                93  ECA peak systolic                   101               139  ICA peak systolic                   98                155  ICA end diastolic                   18                32  PLAQUE MORPHOLOGY:                  N/A               Mixed  PLAQUE AMOUNT:                      N/A               Moderate  PLAQUE LOCATION:                    N/A               ICA/bifurcation   IMPRESSION:  1. Right ICA shows no evidence of restenosis status post CEA.  2. Left ICA shows evidence of 40-59% stenosis.  3. No significant changes from previous study.   ___________________________________________  Janetta Hora Fields, MD   AS/MEDQ  D:  04/19/2009  T:  04/19/2009  Job:  [id number]

## 2011-01-15 NOTE — Assessment & Plan Note (Signed)
OFFICE VISIT   KOREN, PLYLER  DOB:  February 14, 1922                                       04/03/2007  CHART#:[id number]   Ms. Roehrig returns for follow up today.  She underwent right carotid  endarterectomy on February 07, 2007.  She was last seen in the office on February 11, 2007 and was very lethargic at that time.  She also had fairly  severe leg edema.  Today on evaluation, she is much more alert and  conversant.  Her daughter states that she has been more alert over the  last few weeks.  Her head CT performed today showed evidence of atrophy,  but no evidence of infarction.  She has no asymmetrical weakness,  slurred speech or facial asymmetric.  Blood pressure today was 142/57,  heart rate 69 and regular.  Neck incision is well healed.  Overall, I  believe Ms. Snowden is making some gradual progress towards recovery.  She probably has some underlying component of dementia, and this may  have been exacerbated some by her general anesthetic.  She does have  fairly significant cerebral atrophy on her CT scan.  I discussed all of  these findings with the patient's daughter today.  Hopefully, she will  continue to recover some with time.  She will follow up with me for a  repeat carotid duplex exam in December of 2008.  She will continue to  take aspirin once daily in the meantime.   Janetta Hora. Fields, MD  Electronically Signed   CEF/MEDQ  D:  04/05/2007  T:  04/06/2007  Job:  [id number]

## 2011-01-15 NOTE — Op Note (Signed)
[name], [name]           ACCOUNT NO.:  [id number]   MEDICAL RECORD NO.:  [id number]          PATIENT TYPE:  INP   LOCATION:  2035                         FACILITY:  [facility]   PHYSICIAN:  Janetta Hora. Fields, MD  DATE OF BIRTH:  Oct 12, 1921   DATE OF PROCEDURE:  02/09/2007  DATE OF DISCHARGE:  02/11/2007                               OPERATIVE REPORT   PROCEDURE:  Right carotid endarterectomy.   PREOPERATIVE DIAGNOSIS:  Symptomatic right internal carotid artery  stenosis.   POSTOPERATIVE DIAGNOSIS:  Symptomatic right internal carotid artery  stenosis.   ANESTHESIA:  General.   ASSISTANT:  Coral Ceo, PA-C.   OPERATIVE FINDINGS:  1. Very high right carotid bifurcation.  2. Dacron patch.  3. 10-French shunt.   OPERATIVE DETAILS:  After obtaining informed consent, the patient was  taken to the operating room.  The patient was placed in the supine  position on the operating table.  After induction of general anesthesia  and endotracheal intubation, the patient's entire right neck and chest  were prepped and draped in the usual sterile fashion.  An oblique  incision was made on the right side of the neck just anterior to the  sternocleidomastoid muscle.  The incision was carried down through the  subcutaneous tissues and platysma.  Dissection proceeded along the  anterior border of the right sternocleidomastoid muscle.  The common  facial vein was dissected free circumferentially and ligated and divided  between silk ties.  Internal jugular vein and sternocleidomastoid  muscles were reflected laterally.  The common carotid artery was  dissected free circumferentially.  An umbilical tape was placed around  this.  Dissection was then carried up to the level of the carotid  bifurcation.  The bifurcation was quite high up at the level of the  angle of the mandible.  Also, the vagus nerve was quite anterior and  crossing over almost at the level of the carotid bifurcation.   The  hypoglossal nerve was just adjacent to this.  The internal and external  carotid arteries were dissected free circumferentially.  The superior  thyroid artery was also dissected free circumferentially.  The internal  carotid artery was dissected free circumferentially above the level of  the hypoglossal nerve.  The hypoglossal nerve was fully mobilized in  order to allow exposure of the more distal internal carotid artery above  the level of plaque.  Approximately half of the posterior belly of the  digastric muscle was also taken down to allow full exposure of this due  to the high bifurcation.  I was able to find a suitable segment of  carotid artery above the plaque after these maneuvers.  Next, the  patient was given 7000 units of intravenous heparin.  Distal internal  carotid artery was clamped with a small bulldog clamp.  The external and  superior thyroid arteries were controlled with vessel loops, and the  common carotid artery was controlled with peripheral DeBakey clamp.  A  longitudinal arteriotomy was made in the common carotid artery, and this  arteriotomy was extended up through the level of the plaque in the  internal  carotid artery.  Again, the plaque extended quite high.  After  opening the artery past the level of the plaque, a 10-French shunt was  brought up on the operative field.  This was threaded into the distal  internal carotid artery and allowed to backbleed thoroughly.  This was  then inserted down into the common carotid artery, and a Rumel  tourniquet was secured around it proximally.  However, on inspection of  the shunt, there was a small air bubble.  Therefore, the proximal aspect  was again taken out, and the artery was again allowed to back bleed  thoroughly.  It was then placed back down into the common carotid artery  and secured with a Rumel tourniquet.  The shunt was then opened back to  the brain, restoring flow to approximate 7 minutes.  It  should be noted  there was very good backbleeding from the shunt.  Next, endarterectomy  was begun in a suitable plane.  A good endpoint was obtained.  There was  one area where the endothelium was slightly elevated, and this was  tacked down with two 7-0 Prolene sutures.  A Dacron patch was then  brought up on the operative field.  Eversion endarterectomy was  performed on the external carotid artery.  After all loose debris had  been removed from the carotid artery, it was thoroughly irrigated  heparinized saline, and the Dacron patch was sewn on as a patch  angioplasty using a running 6-0 Prolene suture.  Just prior completion  of the anastomosis, this was  forward bled, backbled, and thoroughly  flushed.  The anastomosis was secured.  Flow was first restored from the  common carotid artery up into the external carotid artery, and after  approximately five cardiac cycles flow was restored to the internal  carotid artery.  Shunt was removed just prior to completion of the  anastomosis.  Next, Doppler was used to inspect the carotid artery.  There was good flow in the internal, external, and common carotid  arteries.  Hemostasis was obtained with a few repair sutures and also 50  mg of protamine.  After hemostasis had been obtained, the platysma was  reapproximated using a running 3-0 Vicryl suture.  Skin was closed with  4-0 Vicryl subcuticular stitch.  The patient tolerate the procedure  well, and there were no complications.  Instrument, sponge, and needle  count was correct at the end of the case.  The patient was awakened in  the operating room and found be moving upper and lower extremities  symmetrically and following commands in the operating room prior to  moving to the recovery room.      Janetta Hora. Fields, MD  Electronically Signed     CEF/MEDQ  D:  02/12/2007  T:  02/13/2007  Job:  [id number]

## 2011-01-21 ENCOUNTER — Ambulatory Visit (HOSPITAL_COMMUNITY): Payer: Self-pay | Admitting: Oncology

## 2011-01-25 ENCOUNTER — Encounter (HOSPITAL_COMMUNITY): Payer: Medicare Other | Attending: Oncology | Admitting: Oncology

## 2011-03-29 ENCOUNTER — Other Ambulatory Visit (HOSPITAL_COMMUNITY): Payer: Self-pay | Admitting: Family Medicine

## 2011-03-29 ENCOUNTER — Ambulatory Visit (HOSPITAL_COMMUNITY)
Admission: RE | Admit: 2011-03-29 | Discharge: 2011-03-29 | Disposition: A | Discharge status: Home / Self Care | Payer: Medicare Other | Source: Ambulatory Visit | Attending: Family Medicine | Admitting: Family Medicine

## 2011-03-29 DIAGNOSIS — M51379 Other intervertebral disc degeneration, lumbosacral region without mention of lumbar back pain or lower extremity pain: Secondary | ICD-10-CM | POA: Insufficient documentation

## 2011-03-29 DIAGNOSIS — M545 Low back pain, unspecified: Secondary | ICD-10-CM | POA: Insufficient documentation

## 2011-03-29 DIAGNOSIS — M549 Dorsalgia, unspecified: Secondary | ICD-10-CM

## 2011-03-29 DIAGNOSIS — M5137 Other intervertebral disc degeneration, lumbosacral region: Secondary | ICD-10-CM | POA: Insufficient documentation

## 2011-05-27 LAB — COMPREHENSIVE METABOLIC PANEL
ALT: 19
AST: 21
CO2: 28
Calcium: 9.1
GFR calc Af Amer: 42 — ABNORMAL LOW
Potassium: 4
Sodium: 137
Total Protein: 7.2

## 2011-05-27 LAB — CBC
Platelets: 311
WBC: 13.7 — ABNORMAL HIGH

## 2011-06-20 LAB — CBC
HCT: 37.5
Hemoglobin: 12.3
MCHC: 33.1
MCHC: 33.5
MCHC: 34
MCV: 89.2
MCV: 89.5
MCV: 89.6
Platelets: 189
Platelets: 209
Platelets: 239
RBC: 3.3 — ABNORMAL LOW
RBC: 3.6 — ABNORMAL LOW
RDW: 14
RDW: 14.2 — ABNORMAL HIGH
RDW: 14.3 — ABNORMAL HIGH
RDW: 14.3 — ABNORMAL HIGH
WBC: 13.9 — ABNORMAL HIGH

## 2011-06-20 LAB — BASIC METABOLIC PANEL
BUN: 16
CO2: 24
CO2: 28
Calcium: 7.7 — ABNORMAL LOW
Chloride: 103
Creatinine, Ser: 0.8
Creatinine, Ser: 1.07
GFR calc Af Amer: 59 — ABNORMAL LOW

## 2011-06-20 LAB — PROTIME-INR: Prothrombin Time: 13.4

## 2011-06-20 LAB — HEPARIN LEVEL (UNFRACTIONATED): Heparin Unfractionated: <0.1 — ABNORMAL LOW

## 2011-09-19 ENCOUNTER — Ambulatory Visit (HOSPITAL_COMMUNITY)
Admission: RE | Admit: 2011-09-19 | Discharge: 2011-09-19 | Disposition: A | Discharge status: Home / Self Care | Payer: Medicare Other | Source: Ambulatory Visit | Attending: Orthopedic Surgery | Admitting: Orthopedic Surgery

## 2011-09-19 DIAGNOSIS — M6281 Muscle weakness (generalized): Secondary | ICD-10-CM | POA: Insufficient documentation

## 2011-09-19 DIAGNOSIS — R262 Difficulty in walking, not elsewhere classified: Secondary | ICD-10-CM | POA: Insufficient documentation

## 2011-09-19 DIAGNOSIS — M25669 Stiffness of unspecified knee, not elsewhere classified: Secondary | ICD-10-CM | POA: Insufficient documentation

## 2011-09-19 DIAGNOSIS — IMO0001 Reserved for inherently not codable concepts without codable children: Secondary | ICD-10-CM | POA: Insufficient documentation

## 2011-09-19 DIAGNOSIS — R29898 Other symptoms and signs involving the musculoskeletal system: Secondary | ICD-10-CM | POA: Insufficient documentation

## 2011-09-19 DIAGNOSIS — M25569 Pain in unspecified knee: Secondary | ICD-10-CM | POA: Insufficient documentation

## 2011-09-19 NOTE — Patient Instructions (Addendum)
hep

## 2011-09-19 NOTE — Progress Notes (Signed)
Physical Therapy Evaluation  Patient Details  Name: [name] MRN: [id number] Date of Birth: 05-17-1922  Today's Date: 09/19/2011 Time: 4098-1191 Time Calculation (min): 32 min  Visit#: 1  of 8   Re-eval: 10/19/11 Assessment Diagnosis: OA Prior Therapy: none  Past Medical History: No past medical history on file. Past Surgical History: No past surgical history on file.  Subjective Symptoms/Limitations Symptoms: [name] states that her knees have been bothering her for the past ten years.  She has gotten to a point where it is difficutlt to get out of a chair and she is having trouble just walking in her house therefore she went to the doctor .  She states she  recieved cortisone shots in her knees and was referred to therapy to attempt to improve her ability to walk. Limitations: Sitting;Lifting;Standing;Walking;House hold activities How long can you sit comfortably?: The pateint states that her knees feel stiff after sitting for fifteen minutes and then it is difficult to get up. How long can you stand comfortably?: The patient states that she is only able to stand for three minutes of less How long can you walk comfortably?: The patient ambulates with a walker and is unable to ambulate greater than two minutes before she wants to sit down. Pain Assessment Currently in Pain?: Yes Pain Score:   4 Pain Location: Knee Pain Orientation: Right;Left;Anterior Pain Type: Chronic pain Pain Onset: More than a month ago Pain Frequency: Other (Comment) (when weight bearing.)  Precautions/Restrictions   falls  Assessment RLE AROM (degrees) Right Knee Extension 0-130: 25  Right Knee Flexion 0-140: 100  RLE Strength Right Hip Flexion: 5/5 Right Hip Extension: 3-/5 Right Hip ABduction: 3/5 Right Hip ADduction: 2+/5 Right Knee Flexion: 3+/5 Right Knee Extension: 3+/5 Right Ankle Dorsiflexion: 3+/5 LLE AROM (degrees) Left Knee Extension 0-130: 18  Left Knee Flexion  0-140: 102  LLE Strength Left Hip Flexion: 5/5 Left Hip Extension: 3-/5 Left Hip ABduction: 3+/5 Left Hip ADduction: 2-/5 Left Knee Flexion: 3+/5 Left Knee Extension: 4/5 Left Ankle Dorsiflexion: 3+/5  Exercise/Treatments    Seated Long Arc Quad: Both;10 reps Other Seated Knee Exercises:  (DF/PF x 10) Other Seated Knee Exercises: glut sets; hip adduction x 10  Supine Quad Sets: Strengthening;Both;10 reps Heel Slides: Both;5 reps Bridges: 10 reps Sidelying Hip ABduction: Strengthening;Both;5 reps      Physical Therapy Assessment and Plan PT Assessment and Plan Clinical Impression Statement: Pt with decreased ROM, decreased swelling and weakness of B LE causing difficulty walking who will benefit from skilled PT to address the above issues to improve mobility and quality of life. Rehab Potential: Good Clinical Impairments Affecting Rehab Potential: ROM, swelling weakness, pain PT Frequency: Min 2X/week PT Duration: 4 weeks PT Treatment/Interventions: Gait training;Therapeutic exercise PT Plan: begin bike, gait traiining with walker, mini squat, heel raise as well as gentle PROM next treatment.      Goals Home Exercise Program Pt will Perform Home Exercise Program: Independently PT Short Term Goals PT Short Term Goal 1: Pt ROM to be improved by 5 degrees PT Short Term Goal 2: Pt pain level to be decreased by one level. PT Long Term Goals Time to Complete Long Term Goals: 4 weeks PT Long Term Goal 1: Pt to be I in Advance HEP PT Long Term Goal 2: ROM improved by 10 degrees to allow more normalized walking Long Term Goal 3: Pt to be able to stand/walk for fifteen minutes to increase functional mobility and indepnedence. Long Term Goal  4: strength to be increased by one grade to allow the above to occur. PT Long Term Goal 5: Pt to be able to sit for 30 minutes in comfort to eat a meal.  Problem List Patient Active Problem List  Diagnoses  . Stiffness of joint, not  elsewhere classified, lower leg  . Weakness of both legs  . Difficulty in walking    PT - End of Session Activity Tolerance: Patient tolerated treatment well General Behavior During Session: [facility] for tasks performed Cognition: [facility] for tasks performed PT Plan of Care PT Home Exercise Plan: given HEP Consulted and Agree with Plan of Care: Patient;Family member/caregiver (daughter) Family Member Consulted: daughter   [name],[name] 09/19/2011, 4:56 PM  Physician Documentation Your signature is required to indicate approval of the treatment plan as stated above.  Please sign and either send electronically or make a copy of this report for your files and return this physician signed original.   Please mark one 1.__approve of plan  2. ___approve of plan with the following conditions.   ______________________________                                                          _____________________ Physician Signature                                                                                                             Date

## 2011-09-24 ENCOUNTER — Ambulatory Visit (HOSPITAL_COMMUNITY)
Admission: RE | Admit: 2011-09-24 | Discharge: 2011-09-24 | Disposition: A | Discharge status: Home / Self Care | Payer: Medicare Other | Source: Ambulatory Visit | Attending: Family Medicine | Admitting: Family Medicine

## 2011-09-24 DIAGNOSIS — R262 Difficulty in walking, not elsewhere classified: Secondary | ICD-10-CM

## 2011-09-24 DIAGNOSIS — R29898 Other symptoms and signs involving the musculoskeletal system: Secondary | ICD-10-CM

## 2011-09-24 DIAGNOSIS — M25669 Stiffness of unspecified knee, not elsewhere classified: Secondary | ICD-10-CM

## 2011-09-24 NOTE — Progress Notes (Signed)
Physical Therapy Treatment Patient Details  Name: [name] MRN: [id number] Date of Birth: February 11, 1922  Today's Date: 09/24/2011 Time: 4098-1191 Time Calculation (min): 51 min Visit#: 2  of 8   Re-eval: 10/19/11  Charge: gait: 15 therex 23 min   Subjective:  Pt stated increased knee pain, feels like bones are rubbing together  Precautions/Restrictions  Precautions Precautions: Fall Precaution Comments: previous L THR  Exercise/Treatments Mobility/Balance  Ambulation/Gait Ambulation/Gait: Yes Ambulation/Gait Assistance: 4: Min assist Ambulation/Gait Assistance Details (indicate cue type and reason): cueing for posture, to stay within walker and to pick up R LE.  Decreased R knee ROM noted. Ambulation Distance (Feet): 200 Feet Assistive device: Rolling walker Gait Pattern: Trunk flexed;Decreased step length - left;Decreased step length - right Gait velocity: slow     Aerobic Stationary Bike: attempted on schwinn unable Standing Heel Raises: 10 reps;Limitations Heel Raises Limitations: toe raises Functional Squat: 10 reps;Limitations Functional Squat Limitations: mini squats Supine Heel Slides: Both;5 reps Knee Extension: PROM;3 sets Knee Flexion: PROM;3 sets    Physical Therapy Assessment and Plan PT Assessment and Plan Clinical Impression Statement: Attempted Schwinn bike at begininng of session, pt unable to complete cycle secondary to impaired B LE ROM.  Cueing for posture and to stay in walker and to pick up R LE with gait.  Began gentle PROM B LE  PT Plan: Resume mat exercises, improve gait training with walker next session.    Goals    Problem List Patient Active Problem List  Diagnoses  . Stiffness of joint, not elsewhere classified, lower leg  . Weakness of both legs  . Difficulty in walking    PT - End of Session Equipment Utilized During Treatment: Gait belt Activity Tolerance: Patient tolerated treatment well General Behavior During  Session: [facility] for tasks performed Cognition: [facility] for tasks performed  Juel Burrow 09/24/2011, 6:35 PM

## 2011-09-26 ENCOUNTER — Ambulatory Visit (HOSPITAL_COMMUNITY)
Admission: RE | Admit: 2011-09-26 | Discharge: 2011-09-26 | Disposition: A | Discharge status: Home / Self Care | Payer: Medicare Other | Source: Ambulatory Visit | Attending: Family Medicine | Admitting: Family Medicine

## 2011-09-26 NOTE — Progress Notes (Signed)
Physical Therapy Treatment Patient Details  Name: [name] MRN: [id number] Date of Birth: Aug 20, 1922  Today's Date: 09/26/2011 Time: 0630-1601 Time Calculation (min): 52 min Visit#: 3  of 8   Re-eval: 10/19/11 Charges: Therex x 46'  Subjective: Symptoms/Limitations Symptoms: I'm tired today. Pain Assessment Currently in Pain?: Yes Pain Score:   5 Pain Location: Knee Pain Orientation: Right;Left   Exercise/Treatments Standing Heel Raises: 10 reps;Limitations Heel Raises Limitations: toe raises x 10 Other Standing Knee Exercises: glute set x 10 Other Standing Knee Exercises: tall marches x 10 Supine Heel Slides: 10 reps Knee Extension: PROM;3 sets Knee Flexion: PROM;3 sets   Physical Therapy Assessment and Plan PT Assessment and Plan Clinical Impression Statement: Pt walked into therapy this session with rolling walker rather than using a WC. Pt completes heel raises with minimal difficulty but has moderate difficulty with standing toe raises. Attempted mini-squats but this was to hard on pt's arthritic knees. Began standing tall marches with cueing to enhance erect posture. Pt is somewhat limited by pain and fatigue but otherwise tolerates tx well. PT Plan: Continue to progress per PT POC as pain allows. Begin with gait trainging with walker next session working on standing tall and picking feet up.     Problem List Patient Active Problem List  Diagnoses  . Stiffness of joint, not elsewhere classified, lower leg  . Weakness of both legs  . Difficulty in walking    PT - End of Session Activity Tolerance: Patient tolerated treatment well General Behavior During Session: [facility] for tasks performed Cognition: [facility] for tasks performed  Antonieta Iba 09/26/2011, 3:41 PM

## 2011-10-01 ENCOUNTER — Ambulatory Visit (HOSPITAL_COMMUNITY): Payer: Medicare Other | Admitting: Physical Therapy

## 2011-10-02 ENCOUNTER — Ambulatory Visit (HOSPITAL_COMMUNITY): Payer: Medicare Other | Admitting: *Deleted

## 2011-10-02 ENCOUNTER — Telehealth (HOSPITAL_COMMUNITY): Payer: Self-pay | Admitting: *Deleted

## 2011-10-03 ENCOUNTER — Ambulatory Visit (HOSPITAL_COMMUNITY): Payer: Medicare Other | Admitting: Physical Therapy

## 2011-10-03 ENCOUNTER — Telehealth (HOSPITAL_COMMUNITY): Payer: Self-pay

## 2011-10-04 ENCOUNTER — Ambulatory Visit (HOSPITAL_COMMUNITY): Payer: Medicare Other

## 2011-10-08 ENCOUNTER — Ambulatory Visit (HOSPITAL_COMMUNITY): Payer: Medicare Other

## 2011-10-10 ENCOUNTER — Ambulatory Visit (HOSPITAL_COMMUNITY): Payer: Medicare Other | Admitting: Physical Therapy

## 2011-10-15 ENCOUNTER — Ambulatory Visit (HOSPITAL_COMMUNITY): Payer: Medicare Other

## 2011-10-17 ENCOUNTER — Ambulatory Visit (HOSPITAL_COMMUNITY): Payer: Medicare Other | Admitting: *Deleted

## 2011-12-11 ENCOUNTER — Emergency Department (HOSPITAL_COMMUNITY): Payer: Medicare Other

## 2011-12-11 ENCOUNTER — Encounter (HOSPITAL_COMMUNITY): Payer: Self-pay

## 2011-12-11 ENCOUNTER — Inpatient Hospital Stay (HOSPITAL_COMMUNITY)
Admission: EM | Admit: 2011-12-11 | Discharge: 2011-12-14 | DRG: 689 | Disposition: A | Discharge status: Skilled Nursing Facility | Payer: Medicare Other | Source: Ambulatory Visit | Attending: Internal Medicine | Admitting: Internal Medicine

## 2011-12-11 DIAGNOSIS — Z7982 Long term (current) use of aspirin: Secondary | ICD-10-CM

## 2011-12-11 DIAGNOSIS — N390 Urinary tract infection, site not specified: Principal | ICD-10-CM | POA: Diagnosis present

## 2011-12-11 DIAGNOSIS — Z66 Do not resuscitate: Secondary | ICD-10-CM | POA: Diagnosis present

## 2011-12-11 DIAGNOSIS — M25461 Effusion, right knee: Secondary | ICD-10-CM

## 2011-12-11 DIAGNOSIS — M25669 Stiffness of unspecified knee, not elsewhere classified: Secondary | ICD-10-CM

## 2011-12-11 DIAGNOSIS — M25469 Effusion, unspecified knee: Secondary | ICD-10-CM | POA: Diagnosis present

## 2011-12-11 DIAGNOSIS — J189 Pneumonia, unspecified organism: Secondary | ICD-10-CM | POA: Diagnosis present

## 2011-12-11 DIAGNOSIS — K219 Gastro-esophageal reflux disease without esophagitis: Secondary | ICD-10-CM | POA: Diagnosis present

## 2011-12-11 DIAGNOSIS — IMO0002 Reserved for concepts with insufficient information to code with codable children: Secondary | ICD-10-CM | POA: Diagnosis present

## 2011-12-11 DIAGNOSIS — M1710 Unilateral primary osteoarthritis, unspecified knee: Secondary | ICD-10-CM | POA: Diagnosis present

## 2011-12-11 DIAGNOSIS — M25569 Pain in unspecified knee: Secondary | ICD-10-CM | POA: Diagnosis present

## 2011-12-11 DIAGNOSIS — R262 Difficulty in walking, not elsewhere classified: Secondary | ICD-10-CM

## 2011-12-11 DIAGNOSIS — Z853 Personal history of malignant neoplasm of breast: Secondary | ICD-10-CM

## 2011-12-11 DIAGNOSIS — I10 Essential (primary) hypertension: Secondary | ICD-10-CM

## 2011-12-11 DIAGNOSIS — Z96659 Presence of unspecified artificial knee joint: Secondary | ICD-10-CM

## 2011-12-11 DIAGNOSIS — N183 Chronic kidney disease, stage 3 unspecified: Secondary | ICD-10-CM | POA: Diagnosis present

## 2011-12-11 DIAGNOSIS — M1990 Unspecified osteoarthritis, unspecified site: Secondary | ICD-10-CM

## 2011-12-11 DIAGNOSIS — I129 Hypertensive chronic kidney disease with stage 1 through stage 4 chronic kidney disease, or unspecified chronic kidney disease: Secondary | ICD-10-CM | POA: Diagnosis present

## 2011-12-11 DIAGNOSIS — R29898 Other symptoms and signs involving the musculoskeletal system: Secondary | ICD-10-CM

## 2011-12-11 DIAGNOSIS — E785 Hyperlipidemia, unspecified: Secondary | ICD-10-CM

## 2011-12-11 HISTORY — DX: Pure hypercholesterolemia, unspecified: E78.00

## 2011-12-11 HISTORY — DX: Gastro-esophageal reflux disease without esophagitis: K21.9

## 2011-12-11 HISTORY — DX: Malignant neoplasm of unspecified site of unspecified female breast: C50.919

## 2011-12-11 HISTORY — DX: Pneumonia, unspecified organism: J18.9

## 2011-12-11 HISTORY — DX: Urinary tract infection, site not specified: N39.0

## 2011-12-11 HISTORY — DX: Unspecified osteoarthritis, unspecified site: M19.90

## 2011-12-11 HISTORY — DX: Depression, unspecified: F32.A

## 2011-12-11 HISTORY — DX: Major depressive disorder, single episode, unspecified: F32.9

## 2011-12-11 LAB — CBC
HCT: 36.1 % (ref 36.0–46.0)
Hemoglobin: 11.7 g/dL — ABNORMAL LOW (ref 12.0–15.0)
RBC: 4.16 MIL/uL (ref 3.87–5.11)
WBC: 15.9 10*3/uL — ABNORMAL HIGH (ref 4.0–10.5)

## 2011-12-11 LAB — BASIC METABOLIC PANEL
BUN: 16 mg/dL (ref 6–23)
CO2: 23 mEq/L (ref 19–32)
Chloride: 99 mEq/L (ref 96–112)
Creatinine, Ser: 1.03 mg/dL (ref 0.50–1.10)
Potassium: 4.3 mEq/L (ref 3.5–5.1)

## 2011-12-11 LAB — URINALYSIS, ROUTINE W REFLEX MICROSCOPIC
Bilirubin Urine: NEGATIVE
Glucose, UA: NEGATIVE mg/dL
Ketones, ur: NEGATIVE mg/dL
Specific Gravity, Urine: 1.015 (ref 1.005–1.030)
pH: 6.5 (ref 5.0–8.0)

## 2011-12-11 LAB — DIFFERENTIAL
Lymphocytes Relative: 7 % — ABNORMAL LOW (ref 12–46)
Lymphs Abs: 1.2 10*3/uL (ref 0.7–4.0)
Monocytes Absolute: 1.1 10*3/uL — ABNORMAL HIGH (ref 0.1–1.0)
Monocytes Relative: 7 % (ref 3–12)
Neutro Abs: 13.6 10*3/uL — ABNORMAL HIGH (ref 1.7–7.7)

## 2011-12-11 LAB — URINE MICROSCOPIC-ADD ON

## 2011-12-11 MED ORDER — TRAMADOL HCL 50 MG PO TABS
50.0000 mg | ORAL_TABLET | Freq: Four times a day (QID) | ORAL | Status: DC | PRN
  Filled 2011-12-11: qty 1

## 2011-12-11 MED ORDER — SODIUM CHLORIDE 0.9 % IJ SOLN: 3.0000 mL | INTRAMUSCULAR | Status: DC | PRN

## 2011-12-11 MED ORDER — SIMVASTATIN 20 MG PO TABS
20.0000 mg | ORAL_TABLET | Freq: Every day | ORAL | Status: DC
  Administered 2011-12-12 – 2011-12-13 (×2): 20 mg via ORAL
  Filled 2011-12-11 (×3): qty 1

## 2011-12-11 MED ORDER — MOXIFLOXACIN HCL IN NACL 400 MG/250ML IV SOLN
400.0000 mg | INTRAVENOUS | Status: DC
  Administered 2011-12-11: 400 mg via INTRAVENOUS
  Filled 2011-12-11 (×3): qty 250

## 2011-12-11 MED ORDER — ENOXAPARIN SODIUM 40 MG/0.4ML ~~LOC~~ SOLN
40.0000 mg | SUBCUTANEOUS | Status: DC
  Administered 2011-12-11 – 2011-12-13 (×3): 40 mg via SUBCUTANEOUS
  Filled 2011-12-11 (×4): qty 0.4

## 2011-12-11 MED ORDER — FUROSEMIDE 40 MG PO TABS
40.0000 mg | ORAL_TABLET | Freq: Two times a day (BID) | ORAL | Status: DC
  Administered 2011-12-11 – 2011-12-14 (×6): 40 mg via ORAL
  Filled 2011-12-11 (×8): qty 1

## 2011-12-11 MED ORDER — ALUM & MAG HYDROXIDE-SIMETH 200-200-20 MG/5ML PO SUSP: 30.0000 mL | Freq: Four times a day (QID) | ORAL | Status: DC | PRN

## 2011-12-11 MED ORDER — HYDROCODONE-ACETAMINOPHEN 5-325 MG PO TABS
1.0000 | ORAL_TABLET | ORAL | Status: DC | PRN
  Administered 2011-12-12 – 2011-12-13 (×3): 1 via ORAL
  Administered 2011-12-14: 2 via ORAL
  Filled 2011-12-11: qty 2
  Filled 2011-12-11 (×2): qty 1

## 2011-12-11 MED ORDER — ACETAMINOPHEN 325 MG PO TABS
650.0000 mg | ORAL_TABLET | Freq: Four times a day (QID) | ORAL | Status: DC | PRN
  Administered 2011-12-13: 650 mg via ORAL
  Filled 2011-12-11: qty 2

## 2011-12-11 MED ORDER — ASPIRIN EC 81 MG PO TBEC
81.0000 mg | DELAYED_RELEASE_TABLET | Freq: Every day | ORAL | Status: DC
  Administered 2011-12-11 – 2011-12-14 (×4): 81 mg via ORAL
  Filled 2011-12-11 (×5): qty 1

## 2011-12-11 MED ORDER — PAROXETINE HCL 20 MG PO TABS
20.0000 mg | ORAL_TABLET | Freq: Every day | ORAL | Status: DC
  Administered 2011-12-12 – 2011-12-14 (×3): 20 mg via ORAL
  Filled 2011-12-11 (×3): qty 1

## 2011-12-11 MED ORDER — ONDANSETRON HCL 4 MG/2ML IJ SOLN: 4.0000 mg | Freq: Four times a day (QID) | INTRAMUSCULAR | Status: DC | PRN

## 2011-12-11 MED ORDER — MORPHINE SULFATE 2 MG/ML IJ SOLN: 1.0000 mg | INTRAMUSCULAR | Status: DC | PRN

## 2011-12-11 MED ORDER — ASPIRIN 81 MG PO TABS: 81.0000 mg | ORAL_TABLET | Freq: Every day | ORAL | Status: DC

## 2011-12-11 MED ORDER — BENZONATATE 100 MG PO CAPS
100.0000 mg | ORAL_CAPSULE | Freq: Three times a day (TID) | ORAL | Status: DC | PRN
  Filled 2011-12-11: qty 1

## 2011-12-11 MED ORDER — ALBUTEROL SULFATE (5 MG/ML) 0.5% IN NEBU: 2.5000 mg | INHALATION_SOLUTION | RESPIRATORY_TRACT | Status: DC | PRN

## 2011-12-11 MED ORDER — ZOLPIDEM TARTRATE 5 MG PO TABS
5.0000 mg | ORAL_TABLET | Freq: Every evening | ORAL | Status: DC | PRN
  Administered 2011-12-12 – 2011-12-13 (×2): 5 mg via ORAL
  Filled 2011-12-11 (×2): qty 1

## 2011-12-11 MED ORDER — ONDANSETRON HCL 4 MG PO TABS: 4.0000 mg | ORAL_TABLET | Freq: Four times a day (QID) | ORAL | Status: DC | PRN

## 2011-12-11 MED ORDER — POTASSIUM CHLORIDE CRYS ER 20 MEQ PO TBCR
20.0000 meq | EXTENDED_RELEASE_TABLET | Freq: Two times a day (BID) | ORAL | Status: DC
  Administered 2011-12-11 – 2011-12-14 (×6): 20 meq via ORAL
  Filled 2011-12-11 (×7): qty 1

## 2011-12-11 MED ORDER — AZITHROMYCIN 250 MG PO TABS
500.0000 mg | ORAL_TABLET | Freq: Once | ORAL | Status: AC
  Administered 2011-12-11: 500 mg via ORAL
  Filled 2011-12-11: qty 2

## 2011-12-11 MED ORDER — ALISKIREN FUMARATE 150 MG PO TABS
150.0000 mg | ORAL_TABLET | Freq: Every day | ORAL | Status: DC
  Administered 2011-12-11 – 2011-12-14 (×4): 150 mg via ORAL
  Filled 2011-12-11 (×5): qty 1

## 2011-12-11 MED ORDER — DEXTROSE 5 % IV SOLN
1.0000 g | Freq: Once | INTRAVENOUS | Status: AC
  Administered 2011-12-11: 1 g via INTRAVENOUS
  Filled 2011-12-11: qty 10

## 2011-12-11 MED ORDER — PANTOPRAZOLE SODIUM 40 MG PO TBEC
40.0000 mg | DELAYED_RELEASE_TABLET | Freq: Every day | ORAL | Status: DC
  Administered 2011-12-11 – 2011-12-13 (×3): 40 mg via ORAL
  Filled 2011-12-11 (×3): qty 1

## 2011-12-11 MED ORDER — IPRATROPIUM BROMIDE 0.02 % IN SOLN: 0.5000 mg | RESPIRATORY_TRACT | Status: DC | PRN

## 2011-12-11 MED ORDER — METOPROLOL SUCCINATE ER 50 MG PO TB24
50.0000 mg | ORAL_TABLET | Freq: Two times a day (BID) | ORAL | Status: DC
  Administered 2011-12-11 – 2011-12-14 (×6): 50 mg via ORAL
  Filled 2011-12-11 (×7): qty 1

## 2011-12-11 MED ORDER — ACETAMINOPHEN 650 MG RE SUPP: 650.0000 mg | Freq: Four times a day (QID) | RECTAL | Status: DC | PRN

## 2011-12-11 NOTE — ED Notes (Signed)
Pt transported to and from radiology on stretcher with tech and tolerated well.  Pt able to void upon return to room, specimen obtained if needed.

## 2011-12-11 NOTE — ED Notes (Signed)
Pt presents with continued L knee pain.  Pt reports receiving in home therapy, reports swelling has worsened.  Pt unsure of how long knee has been painful.  Pt reports she is unable to bear weight, uses a walker at home.

## 2011-12-11 NOTE — H&P (Addendum)
PCP:   Taylor Asa, MD, MD   Chief Complaint:  Knee pain  HPI: This is a 78 year old female who was brought in because of knee pain and swelling. It apparently been worsening over the past week, culminating in the patient having difficulty walking today. The patient's issues have chronic knee pain. She states she needs joint replacements but she's afraid of doing so because of her age. She uses a walker mostly but she has a cane and a wheelchair. In the ER workup done revealed a UTI and pneumonia. The patient does report some burning urination and some nausea. She denies any shortness of breath, cough, wheezing. However in the room during my interview patient was coughing nonproductive.history obtained from the patient.  No family member was present at bedside. I did attempt to call her daughter [name]. There is no answer I left a message.  Review of Systems:positives bolded    anorexia, fever, weight loss,, vision loss, decreased hearing, hoarseness, chest pain, syncope, dyspnea on exertion, peripheral edema, balance deficits, hemoptysis, abdominal pain, melena, hematochezia, severe indigestion/heartburn, hematuria, incontinence, genital sores, muscle weakness, suspicious skin lesions, transient blindness, difficulty walking, depression, unusual weight change, abnormal bleeding, enlarged lymph nodes, angioedema, and breast masses.  Past Medical History: Past Medical History  Diagnosis Date  . Breast cancer   . Hypertension   . Depression   . GERD (gastroesophageal reflux disease)   . Hypercholesteremia   . CKD (chronic kidney disease)    Past Surgical History  Procedure Date  . Replacement total knee   . Mastectomy     Medications: Prior to Admission medications   Medication Sig Start Date End Date Taking? Authorizing Provider  aliskiren (TEKTURNA) 150 MG tablet Take 150 mg by mouth daily.   Yes Historical Provider, MD  aspirin 81 MG tablet Take 81 mg by mouth daily.   Yes Historical  Provider, MD  furosemide (LASIX) 40 MG tablet Take 40 mg by mouth 2 (two) times daily.   Yes Historical Provider, MD  metoprolol succinate (TOPROL-XL) 50 MG 24 hr tablet Take 50 mg by mouth 2 (two) times daily. Take with or immediately following a meal.   Yes Historical Provider, MD  pantoprazole (PROTONIX) 40 MG tablet Take 40 mg by mouth daily.   Yes Historical Provider, MD  PARoxetine (PAXIL) 20 MG tablet Take 20 mg by mouth every morning.   Yes Historical Provider, MD  potassium chloride SA (K-DUR,KLOR-CON) 20 MEQ tablet Take 20 mEq by mouth 2 (two) times daily.   Yes Historical Provider, MD  simvastatin (ZOCOR) 20 MG tablet Take 20 mg by mouth daily.   Yes Historical Provider, MD  traMADol (ULTRAM) 50 MG tablet Take 50 mg by mouth as needed.   Yes Historical Provider, MD    Allergies:   Allergies  Allergen Reactions  . Penicillins Rash    Social History:  reports that she has never smoked. She does not have any smokeless tobacco history on file. She reports that she does not drink alcohol or use illicit drugs.  Family History: History reviewed. No pertinent family history.  Physical Exam: Filed Vitals:   12/11/11 1523 12/11/11 1701 12/11/11 1824  BP: 181/81 167/60 179/72  Pulse: 90 81 75  Temp: 100.9 F (38.3 C) 101.3 F (38.5 C) 99.6 F (37.6 C)  TempSrc: Oral Oral Oral  Resp: 20  20  Height: 5\' 5"  (1.651 m)    Weight: 90.719 kg (200 lb)    SpO2: 94% 95% 92%  General:  Alert and oriented times three, well developed and nourished, no acute distress Eyes: PERRLA, pink conjunctiva, no scleral icterus ENT: Moist oral mucosa, neck supple, no thyromegaly Lungs: clear to ascultation, no wheeze, no crackles, no use of accessory muscles Cardiovascular: regular rate and rhythm, no regurgitation, no gallops, no murmurs. No carotid bruits, no JVD Abdomen: soft, positive BS, non-tender, non-distended, no organomegaly, not an acute abdomen GU: not examined Neuro: CN II - XII  grossly intact, sensation intact Musculoskeletal: strength 5/5 all extremities, no clubbing, cyanosis or edema, bilateral leg generalized tenderness to palpation chronic per patient, bilateral knee pain-no warmth, no erythema, positive effusion and crepitations Skin: no rash, no subcutaneous crepitation, no decubitus Psych: appropriate patient   Labs on Admission:   Basename 12/11/11 1740  NA 134*  K 4.3  CL 99  CO2 23  GLUCOSE 152*  BUN 16  CREATININE 1.03  CALCIUM 9.5  MG --  PHOS --   No results found for this basename: AST:2,ALT:2,ALKPHOS:2,BILITOT:2,PROT:2,ALBUMIN:2 in the last 72 hours No results found for this basename: LIPASE:2,AMYLASE:2 in the last 72 hours  Basename 12/11/11 1740  WBC 15.9*  NEUTROABS 13.6*  HGB 11.7*  HCT 36.1  MCV 86.8  PLT 319   No results found for this basename: CKTOTAL:3,CKMB:3,CKMBINDEX:3,TROPONINI:3 in the last 72 hours No components found with this basename: POCBNP:3 No results found for this basename: DDIMER:2 in the last 72 hours No results found for this basename: HGBA1C:2 in the last 72 hours No results found for this basename: CHOL:2,HDL:2,LDLCALC:2,TRIG:2,CHOLHDL:2,LDLDIRECT:2 in the last 72 hours No results found for this basename: TSH,T4TOTAL,FREET3,T3FREE,THYROIDAB in the last 72 hours No results found for this basename: VITAMINB12:2,FOLATE:2,FERRITIN:2,TIBC:2,IRON:2,RETICCTPCT:2 in the last 72 hours  Micro Results: No results found for this or any previous visit (from the past 240 hour(s)).  Results for Taylor, Hamilton (MRN [id number]) as of 12/11/2011 20:18  Ref. Range 12/11/2011 17:42  Color, Urine Latest Range: YELLOW  YELLOW  APPearance Latest Range: CLEAR  HAZY (A)  Specific Gravity, Urine Latest Range: 1.005-1.030  1.015  pH Latest Range: 5.0-8.0  6.5  Glucose, UA Latest Range: NEGATIVE mg/dL NEGATIVE  Bilirubin Urine Latest Range: NEGATIVE  NEGATIVE  Ketones, ur Latest Range: NEGATIVE mg/dL NEGATIVE  Protein  Latest Range: NEGATIVE mg/dL 30 (A)  Urobilinogen, UA Latest Range: 0.0-1.0 mg/dL 1.0  Nitrite Latest Range: NEGATIVE  POSITIVE (A)  Leukocytes, UA Latest Range: NEGATIVE  MODERATE (A)  Hgb urine dipstick Latest Range: NEGATIVE  TRACE (A)  WBC, UA Latest Range: <3 WBC/hpf 11-20  RBC / HPF Latest Range: <3 RBC/hpf 0-2  Squamous Epithelial / LPF Latest Range: RARE  FEW (A)  Bacteria, UA Latest Range: RARE  MANY (A)  Casts Latest Range: NEGATIVE  HYALINE CASTS (A)   Radiological Exams on Admission: Dg Chest 2 View  12/11/2011  *RADIOLOGY REPORT*  Clinical Data: Fever.  Short of breath.  Cough.  CHEST - 2 VIEW  Comparison: 09/08/2010  Findings: Heart is enlarged.  Calcified right hilar lymph nodes remain evident.  I think there is patchy density in the left lower lobe that could represent mild pneumonia.  No dense consolidation. No effusion.  No acute bony finding.  IMPRESSION: Cardiomegaly.  Suspicion of patchy infiltrate in the left lower lobe.  Original Report Authenticated By: Thomasenia Sales, M.D.   Dg Knee 2 Views Left  12/11/2011  *RADIOLOGY REPORT*  Clinical Data: Bilateral knee pain.  LEFT KNEE - 1-2 VIEW  Comparison: None.  Findings: Severe degenerative changes  in the left knee with joint space narrowing and osteophyte formation.  Moderate joint effusion. Diffuse osteopenia. No acute bony abnormality.  Specifically, no fracture, subluxation, or dislocation.  Soft tissues are intact.  IMPRESSION: Severe degenerative changes, osteopenia.  Moderate joint effusion. No acute findings.  Original Report Authenticated By: Cyndie Chime, M.D.   Dg Knee 2 Views Right  12/11/2011  *RADIOLOGY REPORT*  Clinical Data: Bilateral knee pain.  RIGHT KNEE - 1-2 VIEW  Comparison: Left knee May 4.  Findings: Severe degenerative changes of osteopenia.  Severe joint space narrowing, most pronounced in the medial compartment. Osteophyte formation.  Moderate joint effusion. No acute bony abnormality.  Specifically,  no fracture, subluxation, or dislocation.  Soft tissues are intact.  IMPRESSION: Severe degenerative changes, osteopenia.  Moderate joint effusion.  No acute findings.  Original Report Authenticated By: Cyndie Chime, M.D.    Assessment/Plan Present on Admission:  .UTI (lower urinary tract infection) .Community acquired pneumonia Admit to MedSurg Antibiotics started  Sputum cultures, urine cultures ordered. Nebulizers as needed  .Pain, knee Joint effusion Doesn't appear infected. Pain medications as needed Will consult physical therapy History of breast cancer Chronic kidney disease stage 3 Hypertension Dyslipidemia Stable resume home medications  DVT prophylaxis the Team 2/Dr. Andree Moro, Paden Kuras 12/11/2011, 8:15 PM

## 2011-12-11 NOTE — ED Provider Notes (Signed)
History     CSN: [id number]  Arrival date & time 12/11/11  1513   First MD Initiated Contact with Patient 12/11/11 1517      Chief Complaint  Patient presents with  . Knee Pain    (Consider location/radiation/quality/duration/timing/severity/associated sxs/prior treatment) HPI Comments: Patient presents from home with bilateral knee pain and swelling. This has been worsening over the past week and patient was unable to walk today. She is typically ambulatory with a walker. Patient did not sustain any injury from the fall. Patient sees Dr. Wyline Mood. She has had steroid injections and physical therapy over the past year. She was doing well with these treatments. Patient and family deny recent fever, cough, chest pain, abdominal pain, nausea, vomiting, diarrhea, urinary symptoms.  Patient is a 78 y.o. female presenting with knee pain. The history is provided by the patient, the spouse and a relative.  Knee Pain This is a new problem. The current episode started in the past 7 days. The problem occurs constantly. The problem has been unchanged. Associated symptoms include arthralgias and joint swelling. Pertinent negatives include no abdominal pain, chest pain, congestion, coughing, fever, headaches, myalgias, nausea, neck pain, numbness, rash, sore throat, vomiting or weakness. Exacerbated by: walking. She has tried nothing for the symptoms.    Past Medical History  Diagnosis Date  . Cancer     Past Surgical History  Procedure Date  . Replacement total knee     No family history on file.  History  Substance Use Topics  . Smoking status: Never Smoker   . Smokeless tobacco: Not on file  . Alcohol Use: No    OB History    Grav Para Term Preterm Abortions TAB SAB Ect Mult Living                  Review of Systems  Constitutional: Positive for appetite change (Decreased for several days). Negative for fever.  HENT: Negative for congestion, sore throat, rhinorrhea and neck pain.    Eyes: Negative for redness.  Respiratory: Negative for cough.   Cardiovascular: Negative for chest pain.  Gastrointestinal: Negative for nausea, vomiting, abdominal pain and diarrhea.  Genitourinary: Negative for dysuria.  Musculoskeletal: Positive for joint swelling and arthralgias. Negative for myalgias.  Skin: Negative for rash.  Neurological: Negative for weakness, numbness and headaches.  Psychiatric/Behavioral: Negative for confusion.    Allergies  Review of patient's allergies indicates not on file.  Home Medications  No current outpatient prescriptions on file.  BP 181/81  Pulse 90  Temp(Src) 100.9 F (38.3 C) (Oral)  Resp 20  Ht 5\' 5"  (1.651 m)  Wt 200 lb (90.719 kg)  BMI 33.28 kg/m2  SpO2 94%  Physical Exam  Nursing note and vitals reviewed. Constitutional: She is oriented to person, place, and time. She appears well-developed and well-nourished.  HENT:  Head: Normocephalic and atraumatic.  Eyes: Conjunctivae are normal. Pupils are equal, round, and reactive to light. Right eye exhibits no discharge. Left eye exhibits no discharge.  Neck: Normal range of motion. Neck supple.  Cardiovascular: Normal rate, regular rhythm and normal heart sounds.   Pulmonary/Chest: Effort normal and breath sounds normal. She has no wheezes. She has no rales.  Abdominal: Soft. Bowel sounds are normal. There is no tenderness. There is no rebound and no guarding.  Musculoskeletal:       Full passive extension and flexion of knees bilaterally. There is pain with movement of knees. There is gross effusion of both knees bilaterally. There  is no overlying redness or erythema. Full range of motion in hips without pain. Pelvis is stable  Neurological: She is alert and oriented to person, place, and time.  Skin: Skin is warm and dry.  Psychiatric: She has a normal mood and affect.    ED Course  Procedures (including critical care time)  Labs Reviewed  CBC - Abnormal; Notable for the  following:    WBC 15.9 (*)    Hemoglobin 11.7 (*)    All other components within normal limits  DIFFERENTIAL - Abnormal; Notable for the following:    Neutrophils Relative 86 (*)    Neutro Abs 13.6 (*)    Lymphocytes Relative 7 (*)    Monocytes Absolute 1.1 (*)    All other components within normal limits  BASIC METABOLIC PANEL - Abnormal; Notable for the following:    Sodium 134 (*)    Glucose, Bld 152 (*)    GFR calc non Af Amer 47 (*)    GFR calc Af Amer 55 (*)    All other components within normal limits  URINALYSIS, ROUTINE W REFLEX MICROSCOPIC - Abnormal; Notable for the following:    APPearance HAZY (*)    Hgb urine dipstick TRACE (*)    Protein, ur 30 (*)    Nitrite POSITIVE (*)    Leukocytes, UA MODERATE (*)    All other components within normal limits  URINE MICROSCOPIC-ADD ON - Abnormal; Notable for the following:    Squamous Epithelial / LPF FEW (*)    Bacteria, UA MANY (*)    Casts HYALINE CASTS (*)    All other components within normal limits  C-REACTIVE PROTEIN  URINE CULTURE   Dg Chest 2 View  12/11/2011  *RADIOLOGY REPORT*  Clinical Data: Fever.  Short of breath.  Cough.  CHEST - 2 VIEW  Comparison: 09/08/2010  Findings: Heart is enlarged.  Calcified right hilar lymph nodes remain evident.  I think there is patchy density in the left lower lobe that could represent mild pneumonia.  No dense consolidation. No effusion.  No acute bony finding.  IMPRESSION: Cardiomegaly.  Suspicion of patchy infiltrate in the left lower lobe.  Original Report Authenticated By: Thomasenia Sales, M.D.   Dg Knee 2 Views Left  12/11/2011  *RADIOLOGY REPORT*  Clinical Data: Bilateral knee pain.  LEFT KNEE - 1-2 VIEW  Comparison: None.  Findings: Severe degenerative changes in the left knee with joint space narrowing and osteophyte formation.  Moderate joint effusion. Diffuse osteopenia. No acute bony abnormality.  Specifically, no fracture, subluxation, or dislocation.  Soft tissues are  intact.  IMPRESSION: Severe degenerative changes, osteopenia.  Moderate joint effusion. No acute findings.  Original Report Authenticated By: Cyndie Chime, M.D.   Dg Knee 2 Views Right  12/11/2011  *RADIOLOGY REPORT*  Clinical Data: Bilateral knee pain.  RIGHT KNEE - 1-2 VIEW  Comparison: Left knee May 4.  Findings: Severe degenerative changes of osteopenia.  Severe joint space narrowing, most pronounced in the medial compartment. Osteophyte formation.  Moderate joint effusion. No acute bony abnormality.  Specifically, no fracture, subluxation, or dislocation.  Soft tissues are intact.  IMPRESSION: Severe degenerative changes, osteopenia.  Moderate joint effusion.  No acute findings.  Original Report Authenticated By: Cyndie Chime, M.D.     1. Urinary tract infection   2. CAP (community acquired pneumonia)   3. Bilateral knee effusions     3:48 PM Patient seen and examined.    Vital signs reviewed and are  as follows: Filed Vitals:   12/11/11 1523  BP: 181/81  Pulse: 90  Temp: 100.9 F (38.3 C)  Resp: 20   Patient with knee effusions. X-rays ordered. Wyline Mood is orthopedist. Fever noted.   7:02 PM patient has been seen by and discussed with Dr. Freida Busman. Will admit for UTI, possible PNA, knee effusion, fall risk. Abx ordered. X-rays reviewed by myself.   7:27 PM [facility] to see patient.   BP 179/72  Pulse 75  Temp(Src) 99.6 F (37.6 C) (Oral)  Resp 20  Ht 5\' 5"  (1.651 m)  Wt 200 lb (90.719 kg)  BMI 33.28 kg/m2  SpO2 92%    MDM  Admit for abx treatment of UTI, PNA, knee effusions.         Renne Crigler, [place] 12/11/11 1927

## 2011-12-11 NOTE — ED Notes (Signed)
Family at bedside, PA at bedside for evaluation.

## 2011-12-11 NOTE — ED Notes (Signed)
Daughter, Dennie Bible:  Cell number:  [phone]

## 2011-12-11 NOTE — ED Provider Notes (Signed)
Medical screening examination/treatment/procedure(s) were conducted as a shared visit with non-physician practitioner(s) and myself.  I personally evaluated the patient during the encounter  Patient's workup here consistent with urinary tract infection and possible pneumonia. She'll be started on antibiotics and admitted to the hospitalist  Toy Baker, MD 12/11/11 [phone]

## 2011-12-12 ENCOUNTER — Encounter (HOSPITAL_COMMUNITY): Payer: Self-pay | Admitting: General Practice

## 2011-12-12 LAB — CBC
MCH: 28.1 pg (ref 26.0–34.0)
MCHC: 32.8 g/dL (ref 30.0–36.0)
MCV: 85.5 fL (ref 78.0–100.0)
Platelets: 309 10*3/uL (ref 150–400)
RBC: 3.85 MIL/uL — ABNORMAL LOW (ref 3.87–5.11)

## 2011-12-12 LAB — BASIC METABOLIC PANEL
BUN: 18 mg/dL (ref 6–23)
CO2: 25 mEq/L (ref 19–32)
Calcium: 9.2 mg/dL (ref 8.4–10.5)
Glucose, Bld: 110 mg/dL — ABNORMAL HIGH (ref 70–99)
Sodium: 135 mEq/L (ref 135–145)

## 2011-12-12 MED ORDER — LEVOFLOXACIN IN D5W 250 MG/50ML IV SOLN
250.0000 mg | INTRAVENOUS | Status: DC
  Administered 2011-12-12: 250 mg via INTRAVENOUS
  Filled 2011-12-12: qty 50

## 2011-12-12 NOTE — Evaluation (Signed)
Physical Therapy Evaluation Patient Details Name: [name] MRN: [id number] DOB: 1921-12-02 Today's Date: 12/12/2011  Problem List:  Patient Active Problem List  Diagnoses  . Stiffness of joint, not elsewhere classified, lower leg  . Weakness of both legs  . Difficulty in walking  . UTI (lower urinary tract infection)  . Community acquired pneumonia  . Arthritis  . Pain, knee  . HX: breast cancer  . Hypertension  . Dyslipidemia    Past Medical History:  Past Medical History  Diagnosis Date  . Breast cancer   . Hypertension   . Depression   . GERD (gastroesophageal reflux disease)   . Hypercholesteremia   . CKD (chronic kidney disease)   . Pneumonia 12/12/2011  . UTI (lower urinary tract infection) 12/12/2011  . Chronic kidney disease   . Shortness of breath   . Arthritis    Past Surgical History:  Past Surgical History  Procedure Date  . Replacement total knee   . Mastectomy   . Total hip arthroplasty   . Carotid endarterectomy   . Cataracts     PT Assessment/Plan/Recommendation PT Assessment Clinical Impression Statement: pt admitted with severe, disproportionate painful legs and found to have UTI and PNA.  Pt. presently is very painful, weak with little activiity tolerance.  She transfers with much difficulty and will not be able to get back directly home at this level.  Recommenc SNF for rehab. PT Recommendation/Assessment: Patient will need skilled PT in the acute care venue PT Problem List: Decreased strength;Decreased activity tolerance;Decreased mobility;Decreased knowledge of use of DME;Decreased knowledge of precautions;Pain Barriers to Discharge: Decreased caregiver support PT Therapy Diagnosis : Acute pain;Generalized weakness PT Plan PT Frequency: Min 3X/week PT Treatment/Interventions: DME instruction;Gait training;Functional mobility training;Therapeutic activities;Therapeutic exercise;Patient/family education PT Recommendation Follow Up  Recommendations: Skilled nursing facility Equipment Recommended: Defer to next venue PT Goals  Acute Rehab PT Goals PT Goal Formulation: With patient/family Time For Goal Achievement: 2 weeks Pt will go Supine/Side to Sit: with supervision PT Goal: Supine/Side to Sit - Progress: Goal set today Pt will go Sit to Stand: with min assist PT Goal: Sit to Stand - Progress: Goal set today Pt will Transfer Bed to Chair/Chair to Bed: with min assist PT Transfer Goal: Bed to Chair/Chair to Bed - Progress: Goal set today Pt will Ambulate: 16 - 50 feet;with supervision;with least restrictive assistive device PT Goal: Ambulate - Progress: Goal set today  PT Evaluation Precautions/Restrictions  Precautions Precautions: Fall Restrictions Weight Bearing Restrictions: No Prior Functioning  Home Living Lives With: Spouse Available Help at Discharge: Family Type of Home: House Home Access: Stairs to enter Home Layout: One level;Able to live on main level with bedroom/bathroom Home Adaptive Equipment: Wheelchair - manual;Walker - rolling;Straight cane Prior Function Level of Independence: Independent with assistive device(s);Needs assistance Needs Assistance: Bathing;Meal Prep;Light Housekeeping Driving: No Cognition Cognition Arousal/Alertness: Awake/alert Overall Cognitive Status: Appears within functional limits for tasks assessed Orientation Level: Oriented X4 Sensation/Coordination Coordination Gross Motor Movements are Fluid and Coordinated: Not tested Extremity Assessment RUE Assessment RUE Assessment: Within Functional Limits LUE Assessment LUE Assessment: Within Functional Limits (grossly weak) RLE Assessment RLE Assessment: Within Functional Limits LLE Assessment LLE Assessment:  (grossly weak at 3+5 and limited knee ROM) Mobility (including Balance) Bed Mobility Bed Mobility: Yes Rolling Right: 3: Mod assist;With rail Rolling Right Details (indicate cue type and reason):  more an UE effort than an initiation of the trunk for R and L rolling Rolling Left: 3: Mod assist;With rail Right  Sidelying to Sit: 3: Mod assist;Patient percentage (comment);HOB flat;Other (comment) (pt =50%) Right Sidelying to Sit Details (indicate cue type and reason): vc's for technique and truncal assist Sit to Supine: 3: Mod assist Transfers Transfers: Yes Sit to Stand: 2: Max assist;From bed;From chair/3-in-1;Other (comment) (to mod assist pt=40-50 %) Sit to Stand Details (indicate cue type and reason): vc for hand placement truncal/lifting assist to come forward over BOS Stand to Sit: 3: Mod assist;To chair/3-in-1;To bed Stand Pivot Transfers: 2: Max assist Stand Pivot Transfer Details (indicate cue type and reason): face to face lifting assist; w/shift assist to help with the pivot Ambulation/Gait Ambulation/Gait: No Stairs: No  Posture/Postural Control Posture/Postural Control: No significant limitations Balance Balance Assessed: No Exercise    End of Session PT - End of Session Activity Tolerance:  (warm up knee AAROM) Patient left: in bed;with call bell in reach;with family/visitor present Nurse Communication: Mobility status for transfers General Behavior During Session: [facility] for tasks performed Cognition: [facility] for tasks performed   Deann Mclaine, Eliseo Gum 12/12/2011, 5:57 PM  12/12/2011  [place], PT [phone] (pager)

## 2011-12-12 NOTE — Progress Notes (Signed)
ANTIBIOTIC CONSULT NOTE - INITIAL  Pharmacy Consult forLevofloxacin Indication: UTI CA-PNA  Allergies  Allergen Reactions  . Penicillins Rash    Patient Measurements: Height: 5\' 5"  (165.1 cm) Weight: 214 lb 4.6 oz (97.2 kg) IBW/kg (Calculated) : 57  Adjusted Body Weight: 70.2 kg Vital Signs: Temp: 98.7 F (37.1 C) (04/11 1351) Temp src: Oral (04/11 1351) BP: 145/81 mmHg (04/11 1351) Pulse Rate: 77  (04/11 1351) Intake/Output from previous day: 04/10 0701 - 04/11 0700 In: 250 [IV Piggyback:250] Out: -  Intake/Output from this shift: Total I/O In: 480 [P.O.:480] Out: -   Labs:  Basename 12/12/11 0535 12/11/11 1740  WBC 13.5* 15.9*  HGB 10.8* 11.7*  PLT 309 319  LABCREA -- --  CREATININE 1.00 1.03   Estimated Creatinine Clearance: 45.7 ml/min (by C-G formula based on Cr of 1). No results found for this basename: VANCOTROUGH:2,VANCOPEAK:2,VANCORANDOM:2,GENTTROUGH:2,GENTPEAK:2,GENTRANDOM:2,TOBRATROUGH:2,TOBRAPEAK:2,TOBRARND:2,AMIKACINPEAK:2,AMIKACINTROU:2,AMIKACIN:2, in the last 72 hours   Microbiology: Recent Results (from the past 720 hour(s))  URINE CULTURE     Status: Normal (Preliminary result)   Collection Time   12/11/11  5:42 PM      Component Value Range Status Comment   Specimen Description URINE, CLEAN CATCH   Final    Special Requests ADDED 12/11/11 1843   Final    Culture  Setup Time [id number]   Final    Colony Count >=100,000 COLONIES/ML   Final    Culture PROTEUS MIRABILIS   Final    Report Status PENDING   Incomplete     Medical History: Past Medical History  Diagnosis Date  . Breast cancer   . Hypertension   . Depression   . GERD (gastroesophageal reflux disease)   . Hypercholesteremia   . CKD (chronic kidney disease)   . Pneumonia 12/12/2011  . UTI (lower urinary tract infection) 12/12/2011  . Chronic kidney disease   . Shortness of breath   . Arthritis     Medications:  Levaquin Assessment: This is a 78 year old female who was  brought in because of knee pain and swelling. It apparently been worsening over the past week, culminating in the patient having difficulty walking today. The patient's issues have chronic knee pain. She states she needs joint replacements but she's afraid of doing so because of her age. She uses a walker mostly but she has a cane and a wheelchair. In the ER workup done revealed a UTI and pneumonia. The patient does report some burning urination and some nausea. She denies any shortness of breath, cough, wheezing. Patient started on Avelox now changed to levofloxacin.  Pt has CrCl -45.7   Goal of Therapy:PNA &b UTI coverage.    Plan:  levofloxin 250 mg q 48 hours  Lucille Passy 12/12/2011,2:26 PM

## 2011-12-12 NOTE — Clinical Documentation Improvement (Signed)
CKD DOCUMENTATION CLARIFICATION QUERY   THIS DOCUMENT IS NOT A PERMANENT PART OF THE MEDICAL RECORD  TO RESPOND TO THE THIS QUERY, FOLLOW THE INSTRUCTIONS BELOW:  1. If needed, update documentation for the patient's encounter via the notes activity.  2. Access this query again and click edit on the In [employer].  3. After updating, or not, click F2 to complete all highlighted (required) fields concerning your review. Select "additional documentation in the medical record" OR "no additional documentation provided".  4. Click Sign note button.  5. The deficiency will fall out of your In Basket *Please let us know if you are not able to complete this workflow by phone or e-mail (listed below).  Please update your documentation within the medical record to reflect your response to this query.                                                                                        12/12/11   Dear Dr. Algis Downs. [name],  In a better effort to capture your patient's severity of illness, reflect appropriate length of stay and utilization of resources, a review of the patient medical record has revealed the following indicators.    Based on your clinical judgment, please clarify and document in a progress note and/or discharge summary the clinical condition associated with the following supporting information:  In responding to this query please exercise your independent judgment.  The fact that a query is asked, does not imply that any particular answer is desired or expected.   Per H +P patient has "CKD"  Please clarify the stage if known, patient's GFR 47 on 4/10 and 49 on 4/11 and 30 a year ago.  Thank you   Possible Clinical Conditions?   _______CKD Stage I -  GFR > OR = 90 _______CKD Stage II - GFR 60-80 _______CKD Stage III - GFR 30-59 _______CKD Stage IV - GFR 15-29 _______CKD Stage V - GFR < 15  _______Other condition_____________ _______Cannot Clinically  determine     Risk Factors:  UTI, PNA, age, HTN  Signs & Symptoms: none noted  Diagnostics:  Creatine weekly Creatinine level (baseline and current) 1.00 (4/11)  1.62 (09/11/10)  Calculated GFR: noted above  Urine: hazy Protein:  30 ( 4/10) Casts: Hyaline casts (4/10)   You may use possible, probable, or suspect with inpatient documentation. possible, probable, suspected diagnoses MUST be documented at the time of discharge  Reviewed:    Thank You,  Leonette Most Addison  Clinical Documentation Specialist Rn, BSN:  Pager [phone]//HIM off [phone] Health Information Management [place]  This is done. See hpi

## 2011-12-12 NOTE — Progress Notes (Signed)
Utilization review completed.  

## 2011-12-12 NOTE — Evaluation (Signed)
Clinical/Bedside Swallow Evaluation Patient Details  Name: [name] MRN: [id number] DOB: October 29, 1921 Today's Date: 12/12/2011  Past Medical History:  Past Medical History  Diagnosis Date  . Breast cancer   . Hypertension   . Depression   . GERD (gastroesophageal reflux disease)   . Hypercholesteremia   . CKD (chronic kidney disease)   . Pneumonia 12/12/2011  . UTI (lower urinary tract infection) 12/12/2011  . Chronic kidney disease   . Shortness of breath   . Arthritis    Past Surgical History:  Past Surgical History  Procedure Date  . Replacement total knee   . Mastectomy   . Total hip arthroplasty   . Carotid endarterectomy   . Cataracts    HPI:  78yo female presented from home with bilateral knee pain and difficulty ambulating due to pain. However, upon admission evaluation pt was found to have pneumonia and UTI.  cxr 4/10: Cardiomegaly.  Suspicion of patchy infiltrate in the left lower lobe.  Lungs currently CTA and pt is afebrile.  Clinical swallow eval ordered to r/o dysphagia.   Assessment/Recommendations/Treatment Plan   Clinical Impression: Pt presents with functional oropharyngeal swallow with no clinical indicators of dysphagia or compromised airway protection.  Rec continuing heart healthy diet with thin liquids.  No slp f/u indicated.  Will sign off. Risk for Aspiration: Mild  Swallow Evaluation Recommendations Diet Recommendations: Regular;Thin liquid Liquid Administration via: Cup;Straw Medication Administration: Whole meds with liquid Supervision: Patient able to self feed Follow up Recommendations: None        Individuals Consulted Consulted and Agree with Results and Recommendations: Patient  Swallowing Goals n/a   [name] L. [name], [place] CCC/SLP Pager [phone]   [name] [name] 12/12/2011,3:46 PM

## 2011-12-12 NOTE — Progress Notes (Signed)
PATIENT DETAILS Name: [name] Age: 78 y.o. Sex: female Date of Birth: 08-10-1922 Admit Date: 12/11/2011 ZOX:WRUEAV,W S, MD, MD POA:   CONSULTS: None  PROCEDURES: none   Interim history:  No events overnight.  78yo female presented from home with bilateral knee pain and difficulty ambulating due to pain. However, upon admission evaluation pt was found to have pneumonia and UTI  Subjective: No complaints. States she has had a "nagging" cough for about one week. She denies any chest pain or shortness of breath. No family at bedside on evaluation  Objective: Vital signs in last 24 hours: Temp:  [97.5 F (36.4 C)-101.3 F (38.5 C)] 98.7 F (37.1 C) (04/11 1351) Pulse Rate:  [75-90] 77  (04/11 1351) Resp:  [20-24] 20  (04/11 1351) BP: (123-181)/(60-86) 145/81 mmHg (04/11 1351) SpO2:  [91 %-96 %] 91 % (04/11 1351) Weight:  [90.719 kg (200 lb)-97.2 kg (214 lb 4.6 oz)] 97.2 kg (214 lb 4.6 oz) (04/10 2119) Weight change:  Last BM Date: 12/11/11  Intake/Output from previous day:  Intake/Output Summary (Last 24 hours) at 12/12/11 1413 Last data filed at 12/12/11 1300  Gross per 24 hour  Intake    730 ml  Output      0 ml  Net    730 ml     Physical Exam:  Gen:  Easily awakened, alert in NAD Cardiovascular:  S1S2 RRR, no m/r/g Respiratory: CTAB, no w/r/c, no increased wob Gastrointestinal: abdomen obese, soft, NT/ND, BS+ Extremities: no c/c/e. No obvious joint effusion bilateral knees.    Lab Results:  Lab 12/12/11 0535 12/11/11 1740  HGB 10.8* 11.7*  HCT 32.9* 36.1  WBC 13.5* 15.9*  PLT 309 319     Lab 12/12/11 0535 12/11/11 1740  NA 135 134*  K 3.5 4.3  CL 99 99  CO2 25 23  GLUCOSE 110* 152*  BUN 18 16  CREATININE 1.00 1.03  CALCIUM 9.2 9.5  MG -- --  PHOS -- --    Studies/Results: Dg Chest 2 View  12/11/2011  *RADIOLOGY REPORT*  Clinical Data: Fever.  Short of breath.  Cough.  CHEST - 2 VIEW  Comparison: 09/08/2010  Findings: Heart is  enlarged.  Calcified right hilar lymph nodes remain evident.  I think there is patchy density in the left lower lobe that could represent mild pneumonia.  No dense consolidation. No effusion.  No acute bony finding.  IMPRESSION: Cardiomegaly.  Suspicion of patchy infiltrate in the left lower lobe.  Original Report Authenticated By: Thomasenia Sales, M.D.   Dg Knee 2 Views Left  12/11/2011  *RADIOLOGY REPORT*  Clinical Data: Bilateral knee pain.  LEFT KNEE - 1-2 VIEW  Comparison: None.  Findings: Severe degenerative changes in the left knee with joint space narrowing and osteophyte formation.  Moderate joint effusion. Diffuse osteopenia. No acute bony abnormality.  Specifically, no fracture, subluxation, or dislocation.  Soft tissues are intact.  IMPRESSION: Severe degenerative changes, osteopenia.  Moderate joint effusion. No acute findings.  Original Report Authenticated By: Cyndie Chime, M.D.   Dg Knee 2 Views Right  12/11/2011  *RADIOLOGY REPORT*  Clinical Data: Bilateral knee pain.  RIGHT KNEE - 1-2 VIEW  Comparison: Left knee May 4.  Findings: Severe degenerative changes of osteopenia.  Severe joint space narrowing, most pronounced in the medial compartment. Osteophyte formation.  Moderate joint effusion. No acute bony abnormality.  Specifically, no fracture, subluxation, or dislocation.  Soft tissues are intact.  IMPRESSION: Severe degenerative changes, osteopenia.  Moderate joint effusion.  No acute findings.  Original Report Authenticated By: Cyndie Chime, M.D.    Medications: Scheduled Meds:   . aliskiren  150 mg Oral Daily  . aspirin EC  81 mg Oral Daily  . azithromycin  500 mg Oral Once  . cefTRIAXone (ROCEPHIN)  IV  1 g Intravenous Once  . enoxaparin  40 mg Subcutaneous Q24H  . furosemide  40 mg Oral BID  . metoprolol succinate  50 mg Oral BID  . pantoprazole  40 mg Oral Q1200  . PARoxetine  20 mg Oral Daily  . potassium chloride SA  20 mEq Oral BID  . simvastatin  20 mg Oral  q1800  . DISCONTD: aspirin  81 mg Oral Daily  . DISCONTD: moxifloxacin  400 mg Intravenous Q24H   Continuous Infusions:  PRN Meds:.acetaminophen, acetaminophen, albuterol, alum & mag hydroxide-simeth, benzonatate, HYDROcodone-acetaminophen, ipratropium, morphine, ondansetron (ZOFRAN) IV, ondansetron, sodium chloride, traMADol, zolpidem Antibiotics: Anti-infectives     Start     Dose/Rate Route Frequency Ordered Stop   12/11/11 2200   moxifloxacin (AVELOX) IVPB 400 mg  Status:  Discontinued        400 mg 250 mL/hr over 60 Minutes Intravenous Every 24 hours 12/11/11 2125 12/12/11 1000   12/11/11 1900   cefTRIAXone (ROCEPHIN) 1 g in dextrose 5 % 50 mL IVPB        1 g 100 mL/hr over 30 Minutes Intravenous  Once 12/11/11 1849 12/11/11 1958   12/11/11 1900   azithromycin (ZITHROMAX) tablet 500 mg        500 mg Oral  Once 12/11/11 1849 12/11/11 1926           Assessment/Plan:  Principal Problem:  *UTI (lower urinary tract infection) Active Problems:  Community acquired pneumonia  Arthritis  Pain, knee  HX: breast cancer  Hypertension  Dyslipidemia  1. CAP: pt with mild leukocytosis, fever, and cough. She was started on Avelox at time of admission. Will change to Levaquin to cover for UTI. Monitor WBC trend. VSS, pt nontoxic appearing.   2. UTI: Preliminary cultures showing >100,000 colonies Proteus Mirabilis. Await sensitivities. Continue Levaquin for above.  3. Knee pain in setting of severe osteoarthritis: Has received multiple steroid injections in the past. Does not want knee replacement surgery given her age. Will ask PT/OT to evaluate for d/c planning. Consider inpt ortho consult if pt is unable to ambulate with PT  4. ? Dementia: mildly confused on exam which could be related to current infection, but suspicious for underlying dementia. Will need to discuss further with pt's family when available.  5. HTN -Controlled with Tekturna, Lopressor, and Lasix   6. H/O  Chronic Lower Extremity Edema -currently legs are at usual baseline -Continue with Lasix  7. Dyslipidemia -Continue with Zocor  5. Dispo: Attempted to call pt's daughter, [name] It seems she was also contacted at time of admission and message left. Will need to discuss discharge plans. Pt may very likely need skilled nursing. Will ask PT/OT to eval.  6. Prophylaxis: on Lovenox  7. Code status: will need to discuss with family when available.   Cordelia Pen, NP-C [facility]  pgr [phone]   LOS: 1 day    12/12/2011, 2:13 PM   Attending I have seen and examined the patient, I agree with the assessment and plan as outlined above. Need to await sensitivities Dr Windell Norfolk

## 2011-12-13 LAB — BASIC METABOLIC PANEL
Chloride: 98 mEq/L (ref 96–112)
Creatinine, Ser: 1.12 mg/dL — ABNORMAL HIGH (ref 0.50–1.10)
GFR calc Af Amer: 50 mL/min — ABNORMAL LOW (ref >90)
Potassium: 3.8 mEq/L (ref 3.5–5.1)
Sodium: 136 mEq/L (ref 135–145)

## 2011-12-13 LAB — URINE CULTURE
Colony Count: >=100000
Culture  Setup Time: 201304101911

## 2011-12-13 LAB — GLUCOSE, CAPILLARY: Glucose-Capillary: 118 mg/dL — ABNORMAL HIGH (ref 70–99)

## 2011-12-13 LAB — CBC
MCV: 84.9 fL (ref 78.0–100.0)
Platelets: 381 10*3/uL (ref 150–400)
RDW: 13.5 % (ref 11.5–15.5)
WBC: 13.3 10*3/uL — ABNORMAL HIGH (ref 4.0–10.5)

## 2011-12-13 MED ORDER — LEVOFLOXACIN 250 MG PO TABS: 250.0000 mg | ORAL_TABLET | ORAL | Status: AC

## 2011-12-13 MED ORDER — BENZONATATE 100 MG PO CAPS: 100.0000 mg | ORAL_CAPSULE | Freq: Three times a day (TID) | ORAL | Status: AC | PRN

## 2011-12-13 MED ORDER — LEVOFLOXACIN 500 MG PO TABS
500.0000 mg | ORAL_TABLET | Freq: Every day | ORAL | Status: DC
  Filled 2011-12-13: qty 1

## 2011-12-13 MED ORDER — DIPHENHYDRAMINE HCL 25 MG PO CAPS: 25.0000 mg | ORAL_CAPSULE | Freq: Three times a day (TID) | ORAL | Status: DC | PRN

## 2011-12-13 MED ORDER — LEVOFLOXACIN 250 MG PO TABS
250.0000 mg | ORAL_TABLET | ORAL | Status: DC
  Administered 2011-12-14: 250 mg via ORAL
  Filled 2011-12-13: qty 1

## 2011-12-13 NOTE — Discharge Summary (Addendum)
PATIENT DETAILS Name: [name] Age: 78 y.o. Sex: female Date of Birth: 1922/04/02 MRN: [id number]. Admit Date: 12/11/2011 Admitting Physician: Marinda Elk, MD WUJ:WJXBJY,N S, MD, MD  PRIMARY DISCHARGE DIAGNOSIS:  Principal Problem:  *UTI (lower urinary tract infection) Active Problems:  Community acquired pneumonia  Arthritis  Pain, knee  HX: breast cancer  Hypertension  Dyslipidemia      PAST MEDICAL HISTORY: Past Medical History  Diagnosis Date  . Breast cancer   . Hypertension   . Depression   . GERD (gastroesophageal reflux disease)   . Hypercholesteremia   . CKD (chronic kidney disease)   . Pneumonia 12/12/2011  . UTI (lower urinary tract infection) 12/12/2011  . Chronic kidney disease   . Shortness of breath   . Arthritis     DISCHARGE MEDICATIONS: Medication List  As of 12/14/2011 10:55 AM   TAKE these medications         aliskiren 150 MG tablet   Commonly known as: TEKTURNA   Take 150 mg by mouth daily.      aspirin 81 MG tablet   Take 81 mg by mouth daily.      benzonatate 100 MG capsule   Commonly known as: TESSALON   Take 1 capsule (100 mg total) by mouth 3 (three) times daily as needed for cough.      furosemide 40 MG tablet   Commonly known as: LASIX   Take 40 mg by mouth 2 (two) times daily.      ICAPS AREDS FORMULA PO   Take 1 tablet by mouth 2 (two) times daily.      levofloxacin 250 MG tablet   Commonly known as: LEVAQUIN   Take 1 tablet (250 mg total) by mouth every other day. For One week from 12/14/11      metoprolol 50 MG tablet   Commonly known as: LOPRESSOR   Take 50 mg by mouth 2 (two) times daily.      pantoprazole 40 MG tablet   Commonly known as: PROTONIX   Take 40 mg by mouth daily.      PARoxetine 20 MG tablet   Commonly known as: PAXIL   Take 20 mg by mouth every morning.      potassium chloride SA 20 MEQ tablet   Commonly known as: K-DUR,KLOR-CON   Take 20 mEq by mouth 2 (two) times daily.     simvastatin 20 MG tablet   Commonly known as: ZOCOR   Take 20 mg by mouth daily.      traMADol 50 MG tablet   Commonly known as: ULTRAM   Take 50-100 mg by mouth every 4 (four) hours as needed. For pain           BRIEF HPI:  See H&P, Labs, Consult and Test reports for all details in brief, This is a 78 year old female who was brought in because of knee pain and swelling. It apparently been worsening over the past week, culminating in the patient having difficulty walking today. The patient's issues have chronic knee pain. She states she needs joint replacements but she's afraid of doing so because of her age. She uses a walker mostly but she has a cane and a wheelchair. In the ER workup done revealed a UTI and pneumonia.  CONSULTATIONS:   None  PERTINENT RADIOLOGIC STUDIES: Dg Chest 2 View  12/11/2011  *RADIOLOGY REPORT*  Clinical Data: Fever.  Short of breath.  Cough.  CHEST - 2 VIEW  Comparison: 09/08/2010  Findings: Heart is enlarged.  Calcified right hilar lymph nodes remain evident.  I think there is patchy density in the left lower lobe that could represent mild pneumonia.  No dense consolidation. No effusion.  No acute bony finding.  IMPRESSION: Cardiomegaly.  Suspicion of patchy infiltrate in the left lower lobe.  Original Report Authenticated By: Thomasenia Sales, M.D.   Dg Knee 2 Views Left  12/11/2011  *RADIOLOGY REPORT*  Clinical Data: Bilateral knee pain.  LEFT KNEE - 1-2 VIEW  Comparison: None.  Findings: Severe degenerative changes in the left knee with joint space narrowing and osteophyte formation.  Moderate joint effusion. Diffuse osteopenia. No acute bony abnormality.  Specifically, no fracture, subluxation, or dislocation.  Soft tissues are intact.  IMPRESSION: Severe degenerative changes, osteopenia.  Moderate joint effusion. No acute findings.  Original Report Authenticated By: Cyndie Chime, M.D.   Dg Knee 2 Views Right  12/11/2011  *RADIOLOGY REPORT*  Clinical Data:  Bilateral knee pain.  RIGHT KNEE - 1-2 VIEW  Comparison: Left knee May 4.  Findings: Severe degenerative changes of osteopenia.  Severe joint space narrowing, most pronounced in the medial compartment. Osteophyte formation.  Moderate joint effusion. No acute bony abnormality.  Specifically, no fracture, subluxation, or dislocation.  Soft tissues are intact.  IMPRESSION: Severe degenerative changes, osteopenia.  Moderate joint effusion.  No acute findings.  Original Report Authenticated By: Cyndie Chime, M.D.     PERTINENT LAB RESULTS: CBC:  Basename 12/13/11 0610 12/12/11 0535  WBC 13.3* 13.5*  HGB 11.5* 10.8*  HCT 34.9* 32.9*  PLT 381 309   CMET CMP     Component Value Date/Time   NA 136 12/13/2011 0610   K 3.8 12/13/2011 0610   CL 98 12/13/2011 0610   CO2 24 12/13/2011 0610   GLUCOSE 127* 12/13/2011 0610   BUN 22 12/13/2011 0610   CREATININE 1.12* 12/13/2011 0610   CALCIUM 9.4 12/13/2011 0610   PROT 6.4 09/06/2010 0505   ALBUMIN 3.3* 09/06/2010 0505   AST 19 09/06/2010 0505   ALT 14 09/06/2010 0505   ALKPHOS 67 09/06/2010 0505   BILITOT 0.9 09/06/2010 0505   GFRNONAA 43* 12/13/2011 0610   GFRAA 50* 12/13/2011 0610    GFR Estimated Creatinine Clearance: 40.8 ml/min (by C-G formula based on Cr of 1.12). No results found for this basename: LIPASE:2,AMYLASE:2 in the last 72 hours No results found for this basename: CKTOTAL:3,CKMB:3,CKMBINDEX:3,TROPONINI:3 in the last 72 hours No components found with this basename: POCBNP:3 No results found for this basename: DDIMER:2 in the last 72 hours No results found for this basename: HGBA1C:2 in the last 72 hours No results found for this basename: CHOL:2,HDL:2,LDLCALC:2,TRIG:2,CHOLHDL:2,LDLDIRECT:2 in the last 72 hours No results found for this basename: TSH,T4TOTAL,FREET3,T3FREE,THYROIDAB in the last 72 hours No results found for this basename: VITAMINB12:2,FOLATE:2,FERRITIN:2,TIBC:2,IRON:2,RETICCTPCT:2 in the last 72 hours Coags: No results found  for this basename: PT:2,INR:2 in the last 72 hours Microbiology: Recent Results (from the past 240 hour(s))  URINE CULTURE     Status: Normal   Collection Time   12/11/11  5:42 PM      Component Value Range Status Comment   Specimen Description URINE, CLEAN CATCH   Final    Special Requests ADDED 12/11/11 1843   Final    Culture  Setup Time [id number]   Final    Colony Count >=100,000 COLONIES/ML   Final    Culture PROTEUS MIRABILIS   Final    Report Status 12/13/2011 FINAL   Final  Organism ID, Bacteria PROTEUS MIRABILIS   Final      BRIEF HOSPITAL COURSE:   Principal Problem:  *UTI (lower urinary tract infection) -Urine cultures Proteus sensitive to Levaquin. Patient will be continued on Levaquin as noted above. -She has been persistently afebrile, her leukocytosis is also down trending.   Community acquired pneumonia -Patient did complain of fever cough and did have mild leukocytosis. As noted above she is on Levaquin. Chest x-ray was suggestive of left-sided infiltrates. -Clinically she is much better, as noted above she is afebrile and his leukocytosis is now down trending.   Arthritis -This is mostly osteoarthritis involving her bilateral knees -She has refused bilateral knee replacements-in the past -She has been seen by an orth[facility] doctor at [facility] has received intra-articular steroids. -Currently in the although slightly tender does not have any obvious sign of overlying infection or inflammation.  ? Dementia: mildly confused on exam which could be related to current infection, but suspicious for underlying dementia. I did discuss with family, and they do come from mild dementia. I would defer to the outpatient setting-for further workup  HTN  -Controlled with Tekturna, Lopressor, and Lasix    H/O Chronic Lower Extremity Edema  -currently legs are at usual baseline  -Continue with Lasix   Dyslipidemia  -Continue with Zocor   End-of-life  issues -Long discussion with 3 daughters and husband at bedside, debility and reconfirmed DO NOT RESUSCITATE status.  TODAY-DAY OF DISCHARGE:  Subjective:   Anajah Sterbenz today has no headache,no chest abdominal pain,no new weakness tingling or numbness, feels much better wants to go home today. She feels less weaker today.  Objective:   Blood pressure 168/74, pulse 72, temperature 97.4 F (36.3 C), temperature source Oral, resp. rate 18, height 5\' 5"  (1.651 m), weight 97.2 kg (214 lb 4.6 oz), SpO2 96.00%.  Intake/Output Summary (Last 24 hours) at 12/14/11 1055 Last data filed at 12/13/11 1440  Gross per 24 hour  Intake    360 ml  Output      0 ml  Net    360 ml    Exam Awake Alert, Oriented *3, No new F.N deficits, Normal affect Trooper.AT,PERRAL Supple Neck,No JVD, No cervical lymphadenopathy appriciated.  Symmetrical Chest wall movement, Good air movement bilaterally, CTAB RRR,No Gallops,Rubs or new Murmurs, No Parasternal Heave +ve B.Sounds, Abd Soft, Non tender, No organomegaly appriciated, No rebound -guarding or rigidity. No Cyanosis, Clubbing or edema, No new Rash or bruise  DISPOSITION: Skilled nursing facility-today   DISCHARGE INSTRUCTIONS:    Follow-up Information    Follow up with Harlow Asa, MD in 2 weeks. (after discharge from SNF)         Total Time spent on discharge equals 45 minutes.  SignedJeoffrey Massed 12/14/2011 10:55 AM

## 2011-12-13 NOTE — Progress Notes (Signed)
ANTIBIOTIC CONSULT NOTE - FOLLOW UP  Pharmacy Consult for levofloxacin Indication: pna and uti  Allergies  Allergen Reactions  . Penicillins Rash    Patient Measurements: Height: 5\' 5"  (165.1 cm) Weight: 214 lb 4.6 oz (97.2 kg) IBW/kg (Calculated) : 57  Adjusted Body Weight:  Vital Signs:   Intake/Output from previous day: 04/11 0701 - 04/12 0700 In: 480 [P.O.:480] Out: -  Intake/Output from this shift: Total I/O In: 360 [P.O.:360] Out: -   Labs:  Basename 12/13/11 0610 12/12/11 0535 12/11/11 1740  WBC 13.3* 13.5* 15.9*  HGB 11.5* 10.8* 11.7*  PLT 381 309 319  LABCREA -- -- --  CREATININE 1.12* 1.00 1.03   Estimated Creatinine Clearance: 40.8 ml/min (by C-G formula based on Cr of 1.12). No results found for this basename: VANCOTROUGH:2,VANCOPEAK:2,VANCORANDOM:2,GENTTROUGH:2,GENTPEAK:2,GENTRANDOM:2,TOBRATROUGH:2,TOBRAPEAK:2,TOBRARND:2,AMIKACINPEAK:2,AMIKACINTROU:2,AMIKACIN:2, in the last 72 hours   Microbiology: Recent Results (from the past 720 hour(s))  URINE CULTURE     Status: Normal   Collection Time   12/11/11  5:42 PM      Component Value Range Status Comment   Specimen Description URINE, CLEAN CATCH   Final    Special Requests ADDED 12/11/11 1843   Final    Culture  Setup Time [id number]   Final    Colony Count >=100,000 COLONIES/ML   Final    Culture PROTEUS MIRABILIS   Final    Report Status 12/13/2011 FINAL   Final    Organism ID, Bacteria PROTEUS MIRABILIS   Final     Anti-infectives     Start     Dose/Rate Route Frequency Ordered Stop   12/13/11 2200   levofloxacin (LEVAQUIN) tablet 500 mg        500 mg Oral Daily at bedtime 12/13/11 1139     12/12/11 2200   Levofloxacin (LEVAQUIN) IVPB 250 mg  Status:  Discontinued        250 mg 50 mL/hr over 60 Minutes Intravenous Every 48 hours 12/12/11 1450 12/13/11 1139   12/11/11 2200   moxifloxacin (AVELOX) IVPB 400 mg  Status:  Discontinued        400 mg 250 mL/hr over 60 Minutes Intravenous Every  24 hours 12/11/11 2125 12/12/11 1000   12/11/11 1900   cefTRIAXone (ROCEPHIN) 1 g in dextrose 5 % 50 mL IVPB        1 g 100 mL/hr over 30 Minutes Intravenous  Once 12/11/11 1849 12/11/11 1958   12/11/11 1900   azithromycin (ZITHROMAX) tablet 500 mg        500 mg Oral  Once 12/11/11 1849 12/11/11 1926          Assessment: 78 yo F on levofloxacin for PNA and UTI.  She rec'd avelox. Azithroycin, rocephin and levofloxacin last night.  Her creatinine clearance is ~ 41 ml/min today. For CAP dose of levo would be 500 qday for 7-14 days for creat cl > 50 ml/min.  For complicated UTI 250mg  daily x 10 days.  To adjust for creat cl < 50 ml/min for the 500 mg daily, we give 500x1 then 250 po q48 hrs.  Requested to change to po by MD today.    Plan:  Change to PO levofloxacin 250mg  PO q 48 hrs, next dose due 4/13  Herby Abraham, Pharm.D. [phone] 12/13/2011 11:45 AM

## 2011-12-13 NOTE — Progress Notes (Signed)
Physical Therapy Treatment Patient Details Name: KEMBA HOPPES MRN: [id number] DOB: 1922/03/27 Today's Date: 12/13/2011  PT Assessment/Plan  PT - Assessment/Plan Comments on Treatment Session: Pt. with great difficulty trying to pivot transfer  today.  Did better taking a few steps forward and bringing recliner chair up behind her.  Pt's mobility is significantly limited by her apparent bone on bone knee arthritis.   PT Plan: Discharge plan remains appropriate PT Frequency: Min 3X/week Follow Up Recommendations: Skilled nursing facility Equipment Recommended: Defer to next venue PT Goals  Acute Rehab PT Goals PT Goal: Supine/Side to Sit - Progress: Progressing toward goal PT Goal: Sit to Stand - Progress: Progressing toward goal PT Goal: Ambulate - Progress: Progressing toward goal  PT Treatment Precautions/Restrictions  Precautions Precautions: Fall Restrictions Weight Bearing Restrictions: No Mobility (including Balance) Bed Mobility Bed Mobility: Yes Supine to Sit: 3: Mod assist;With rails;HOB elevated (Comment degrees) Supine to Sit Details (indicate cue type and reason): safety cues Transfers Transfers: Yes Sit to Stand: 1: +2 Total assist;Patient percentage (comment);From bed;With upper extremity assist (pt. 60%) Sit to Stand Details (indicate cue type and reason): vc's for hand placement and for safe technique Stand to Sit: 1: +2 Total assist;Patient percentage (comment);With armrests;To chair/3-in-1;Other (comment) (pt. 60%) Stand to Sit Details: hand placement cues Ambulation/Gait Ambulation/Gait: Yes Ambulation/Gait Assistance: 3: Mod assist Ambulation/Gait Assistance Details (indicate cue type and reason): and assist to propel RW, safety cues Ambulation Distance (Feet): 3 Feet Assistive device: Rolling walker Gait Pattern: Decreased step length - right;Decreased step length - left;Step-to pattern;Decreased hip/knee flexion - right;Decreased hip/knee flexion -  left Stairs: No    Exercise    End of Session PT - End of Session Equipment Utilized During Treatment: Gait belt Activity Tolerance: Patient limited by fatigue;Patient limited by pain Patient left: in chair;with call bell in reach;with family/visitor present Nurse Communication: Mobility status for transfers;Need for lift equipment General Behavior During Session: [facility] for tasks performed Cognition: [facility] for tasks performed  Ferman Hamming 12/13/2011, 3:33 PM Acute Rehabilitation Services [phone] (pager)

## 2011-12-13 NOTE — ED Provider Notes (Signed)
Medical screening examination/treatment/procedure(s) were conducted as a shared visit with non-physician practitioner(s) and myself.  I personally evaluated the patient during the encounter  Toy Baker, MD 12/13/11 [phone]

## 2011-12-13 NOTE — Progress Notes (Signed)
Clinical Social Worker received notification from [facility] that facility can make bed offer to pt and can accept pt on Saturday 12/14/2011. Clinical Social Worker notified MD and notified pt family. Weekend Clinical Social Worker to facilitate pt discharge needs on Saturday 12/14/2011.  Jacklynn Lewis, MSW, LCSWA  Clinical Social Work [phone]

## 2011-12-13 NOTE — Progress Notes (Signed)
PATIENT DETAILS Name: [name] Age: 78 y.o. Sex: female Date of Birth: 06/03/1922 Admit Date: 12/11/2011 ZOX:[id number] S, MD, MD POA:   CONSULTS: None  PROCEDURES: none   Interim history:  No events overnight.  78yo female presented from home with bilateral knee pain and difficulty ambulating due to pain. However, upon admission evaluation pt was found to have pneumonia and UTI  Subjective: No major events overnight.  Objective: Vital signs in last 24 hours: Temp:  [98.6 F (37 C)] 98.6 F (37 C) (04/12 1415) Pulse Rate:  [66-82] 66  (04/12 1415) Resp:  [18] 18  (04/12 1415) BP: (123-150)/(72-81) 123/72 mmHg (04/12 1415) SpO2:  [95 %] 95 % (04/12 1415) Weight change:  Last BM Date: 12/12/11  Intake/Output from previous day:  Intake/Output Summary (Last 24 hours) at 12/13/11 1501 Last data filed at 12/13/11 1440  Gross per 24 hour  Intake    720 ml  Output      0 ml  Net    720 ml     Physical Exam:  Gen:  Easily awakened, alert in NAD Cardiovascular:  S1S2 RRR, no m/r/g Respiratory: CTAB, no w/r/c, no increased wob Gastrointestinal: abdomen obese, soft, NT/ND, BS+ Extremities: no c/c/e. No obvious joint effusion bilateral knees.    Lab Results:  Lab 12/13/11 0610 12/12/11 0535 12/11/11 1740  HGB 11.5* 10.8* 11.7*  HCT 34.9* 32.9* 36.1  WBC 13.3* 13.5* 15.9*  PLT 381 309 319     Lab 12/13/11 0610 12/12/11 0535 12/11/11 1740  NA 136 135 134*  K 3.8 3.5 --  CL 98 99 99  CO2 24 25 23   GLUCOSE 127* 110* 152*  BUN 22 18 16   CREATININE 1.12* 1.00 1.03  CALCIUM 9.4 9.2 9.5  MG -- -- --  PHOS -- -- --    Studies/Results: Dg Chest 2 View  12/11/2011  *RADIOLOGY REPORT*  Clinical Data: Fever.  Short of breath.  Cough.  CHEST - 2 VIEW  Comparison: 09/08/2010  Findings: Heart is enlarged.  Calcified right hilar lymph nodes remain evident.  I think there is patchy density in the left lower lobe that could represent mild pneumonia.  No dense  consolidation. No effusion.  No acute bony finding.  IMPRESSION: Cardiomegaly.  Suspicion of patchy infiltrate in the left lower lobe.  Original Report Authenticated By: Thomasenia Sales, M.D.   Dg Knee 2 Views Left  12/11/2011  *RADIOLOGY REPORT*  Clinical Data: Bilateral knee pain.  LEFT KNEE - 1-2 VIEW  Comparison: None.  Findings: Severe degenerative changes in the left knee with joint space narrowing and osteophyte formation.  Moderate joint effusion. Diffuse osteopenia. No acute bony abnormality.  Specifically, no fracture, subluxation, or dislocation.  Soft tissues are intact.  IMPRESSION: Severe degenerative changes, osteopenia.  Moderate joint effusion. No acute findings.  Original Report Authenticated By: Cyndie Chime, M.D.   Dg Knee 2 Views Right  12/11/2011  *RADIOLOGY REPORT*  Clinical Data: Bilateral knee pain.  RIGHT KNEE - 1-2 VIEW  Comparison: Left knee May 4.  Findings: Severe degenerative changes of osteopenia.  Severe joint space narrowing, most pronounced in the medial compartment. Osteophyte formation.  Moderate joint effusion. No acute bony abnormality.  Specifically, no fracture, subluxation, or dislocation.  Soft tissues are intact.  IMPRESSION: Severe degenerative changes, osteopenia.  Moderate joint effusion.  No acute findings.  Original Report Authenticated By: Cyndie Chime, M.D.    Medications: Scheduled Meds:    . aliskiren  150  mg Oral Daily  . aspirin EC  81 mg Oral Daily  . enoxaparin  40 mg Subcutaneous Q24H  . furosemide  40 mg Oral BID  . levofloxacin  250 mg Oral Q48H  . metoprolol succinate  50 mg Oral BID  . pantoprazole  40 mg Oral Q1200  . PARoxetine  20 mg Oral Daily  . potassium chloride SA  20 mEq Oral BID  . simvastatin  20 mg Oral q1800  . DISCONTD: levofloxacin (LEVAQUIN) IV  250 mg Intravenous Q48H  . DISCONTD: levofloxacin  500 mg Oral QHS   Continuous Infusions:  PRN Meds:.acetaminophen, acetaminophen, albuterol, alum & mag  hydroxide-simeth, benzonatate, HYDROcodone-acetaminophen, ipratropium, morphine, ondansetron (ZOFRAN) IV, ondansetron, sodium chloride, traMADol, zolpidem Antibiotics: Anti-infectives     Start     Dose/Rate Route Frequency Ordered Stop   12/14/11 1000   levofloxacin (LEVAQUIN) tablet 250 mg        250 mg Oral Every 48 hours 12/13/11 1146     12/13/11 2200   levofloxacin (LEVAQUIN) tablet 500 mg  Status:  Discontinued        500 mg Oral Daily at bedtime 12/13/11 1139 12/13/11 1142   12/13/11 0000   levofloxacin (LEVAQUIN) 250 MG tablet        250 mg Oral Every 48 hours 12/13/11 1419 12/23/11 2359   12/12/11 2200   Levofloxacin (LEVAQUIN) IVPB 250 mg  Status:  Discontinued        250 mg 50 mL/hr over 60 Minutes Intravenous Every 48 hours 12/12/11 1450 12/13/11 1139   12/11/11 2200   moxifloxacin (AVELOX) IVPB 400 mg  Status:  Discontinued        400 mg 250 mL/hr over 60 Minutes Intravenous Every 24 hours 12/11/11 2125 12/12/11 1000   12/11/11 1900   cefTRIAXone (ROCEPHIN) 1 g in dextrose 5 % 50 mL IVPB        1 g 100 mL/hr over 30 Minutes Intravenous  Once 12/11/11 1849 12/11/11 1958   12/11/11 1900   azithromycin (ZITHROMAX) tablet 500 mg        500 mg Oral  Once 12/11/11 1849 12/11/11 1926           Assessment/Plan:  1. CAP: pt with mild leukocytosis, fever, and cough. She was started on Avelox at time of admission. Will change to Levaquin to cover for UTI. Monitor WBC trend. VSS, pt nontoxic appearing. WC count continues to downtrend.  2. UTI: Preliminary cultures showing >100,000 colonies Proteus Mirabilis. Sensitive to Levaquin  3. Knee pain in setting of severe osteoarthritis: Has received multiple steroid injections in the past. Does not want knee replacement surgery given her age. Will ask PT/OT to evaluate for d/c planning-needs skilled nursing facility placement  4. ? Dementia: mildly confused on exam, family does come from mild dementia  5. HTN -Controlled with  Tekturna, Lopressor, and Lasix   6. H/O Chronic Lower Extremity Edema -currently legs are at usual baseline -Continue with Lasix  7. Dyslipidemia -Continue with Zocor  5. Dispo: Attempted to call pt's daughter, [name] It seems she was also contacted at time of admission and message left. Will need to discuss discharge plans. Pt may very likely need skilled nursing. Will ask PT/OT to eval.  6. Prophylaxis: on Lovenox  7. Code status: DO NOT RESUSCITATE, and this was confirmed with 3 daughters and the patient's husband at bedside.  Dr Windell Norfolk

## 2011-12-13 NOTE — Progress Notes (Signed)
Clinical Social Work Department CLINICAL SOCIAL WORK PLACEMENT NOTE 12/13/2011  Patient:  JOYLYN, DUGGIN  Account Number:  [id number] Admit date:  12/11/2011  Clinical Social Worker:  Jacelyn Grip  Date/time:  12/13/2011 01:00 PM  Clinical Social Work is seeking post-discharge placement for this patient at the following level of care:   SKILLED NURSING   (*CSW will update this form in Epic as items are completed)   12/13/2011  Patient/family provided with [facility] Department of Clinical Social Work's list of facilities offering this level of care within the geographic area requested by the patient (or if unable, by the patient's family).  12/13/2011  Patient/family informed of their freedom to choose among providers that offer the needed level of care, that participate in Medicare, Medicaid or managed care program needed by the patient, have an available bed and are willing to accept the patient.  12/13/2011  Patient/family informed of [facility]' ownership interest in [facility], as well as of the fact that they are under no obligation to receive care at this facility.  PASARR submitted to EDS on 12/13/2011 PASARR number received from EDS on 12/13/2011  FL2 transmitted to all facilities in geographic area requested by pt/family on  12/13/2011 FL2 transmitted to all facilities within larger geographic area on   Patient informed that his/her managed care company has contracts with or will negotiate with  certain facilities, including the following:     Patient/family informed of bed offers received:  12/13/2011 Patient chooses bed at [facility] Physician recommends and patient chooses bed at    Patient to be transferred to  on   Patient to be transferred to facility by   The following physician request were entered in Epic:   Additional Comments:  Jacklynn Lewis, MSW, LCSWA  Clinical Social Work [phone]

## 2011-12-13 NOTE — Progress Notes (Signed)
Clinical Social Work Department BRIEF PSYCHOSOCIAL ASSESSMENT 12/13/2011  Patient:  [name], [name]     Account Number:  [id number]     Admit date:  12/11/2011  Clinical Social Worker:  Jacelyn Grip  Date/Time:  12/13/2011 09:30 AM  Referred by:  Physician  Date Referred:  12/13/2011 Referred for  SNF Placement   Other Referral:   Interview type:  Family Other interview type:    PSYCHOSOCIAL DATA Living Status:  HUSBAND Admitted from facility:   Level of care:   Primary support name:  [name]/daughter/[phone] Primary support relationship to patient:  CHILD, ADULT Degree of support available:   strong, pt husband and three daughters at bedside    CURRENT CONCERNS Current Concerns  Post-Acute Placement   Other Concerns:    SOCIAL WORK ASSESSMENT / PLAN CSW met with pt, pt husband, and pt three daughters at bedside along with Dr. Jerral Ralph to discuss pt discharge plans. Dr. Jerral Ralph clarified pt family medical questions and discussed the recommendation for SNF at discharge for pt. CSW provided reflective listening and clarified pt family questions and discussed process of SNF search. Pt husband and daughters agreeable to SNF search to [facility] and [facility]. CSW completed FL2 and initiated SNF search. CSW to follow up with pt family in regard to bed offers. Per MD, pt will be medically stable for discharge and able to go to [facility] Saturday.   Assessment/plan status:  Other - See comment Other assessment/ plan:   discharge planning   Information/referral to community resources:   none at this time- pt family had SNF list from pt previous admission a year ago    PATIENT'S/FAMILY'S RESPONSE TO PLAN OF CARE: Pt is pleasantly confused, but family has been describing SNF placement process to pt. Pt family supportive and actively involved in pt care. Pt 89yo husband has been caring for pt at home, but agrees that pt will need rehab  at a SNF before returning home. Pt family is anxious to have pt close to pt and pt husband home in [place].    Jacklynn Lewis, MSW, LCSWA  Clinical Social Work [phone]

## 2011-12-14 ENCOUNTER — Inpatient Hospital Stay
Admission: RE | Admit: 2011-12-14 | Discharge: 2012-02-05 | Disposition: A | Discharge status: Home / Self Care | Payer: Medicare Other | Source: Ambulatory Visit | Attending: Internal Medicine | Admitting: Internal Medicine

## 2011-12-14 DIAGNOSIS — R52 Pain, unspecified: Principal | ICD-10-CM

## 2011-12-14 NOTE — Progress Notes (Signed)
[facility] D/C'd Skilled nursing facility per MD order.     [name], [place]  Home Medication Instructions ZOX:[id number]   Printed on:12/14/11 1146  Medication Information                    potassium chloride SA (K-DUR,KLOR-CON) 20 MEQ tablet Take 20 mEq by mouth 2 (two) times daily.           furosemide (LASIX) 40 MG tablet Take 40 mg by mouth 2 (two) times daily.           aliskiren (TEKTURNA) 150 MG tablet Take 150 mg by mouth daily.           aspirin 81 MG tablet Take 81 mg by mouth daily.           PARoxetine (PAXIL) 20 MG tablet Take 20 mg by mouth every morning.           pantoprazole (PROTONIX) 40 MG tablet Take 40 mg by mouth daily.           simvastatin (ZOCOR) 20 MG tablet Take 20 mg by mouth daily.           Multiple Vitamins-Minerals (ICAPS AREDS FORMULA PO) Take 1 tablet by mouth 2 (two) times daily.           traMADol (ULTRAM) 50 MG tablet Take 50-100 mg by mouth every 4 (four) hours as needed. For pain           metoprolol (LOPRESSOR) 50 MG tablet Take 50 mg by mouth 2 (two) times daily.           benzonatate (TESSALON) 100 MG capsule Take 1 capsule (100 mg total) by mouth 3 (three) times daily as needed for cough.           levofloxacin (LEVAQUIN) 250 MG tablet Take 1 tablet (250 mg total) by mouth every other day. For One week from 12/14/11             VVS, Skin clean, dry and intact without evidence of skin break down, no evidence of skin tears noted. . Pt was taken by EMS to SNF  Cindra Eves, RN 12/14/2011 11:46 AM

## 2011-12-14 NOTE — Progress Notes (Signed)
CSW informed PT ready for d/c. CSW confirmed with facility, family.  Facility requested addendum, MD informed.  Pt transport via [facility]. Milus Banister MSW,LCSW w/e Coverage [phone]

## 2011-12-16 ENCOUNTER — Inpatient Hospital Stay (HOSPITAL_COMMUNITY): Payer: Medicare Other | Attending: Internal Medicine

## 2012-05-27 ENCOUNTER — Other Ambulatory Visit: Payer: Self-pay | Admitting: Family Medicine

## 2012-05-27 DIAGNOSIS — R609 Edema, unspecified: Secondary | ICD-10-CM

## 2012-05-28 ENCOUNTER — Ambulatory Visit (HOSPITAL_COMMUNITY)
Admission: RE | Admit: 2012-05-28 | Discharge: 2012-05-28 | Disposition: A | Discharge status: Home / Self Care | Payer: Medicare Other | Source: Ambulatory Visit | Attending: Family Medicine | Admitting: Family Medicine

## 2012-05-28 DIAGNOSIS — R609 Edema, unspecified: Secondary | ICD-10-CM

## 2012-05-28 DIAGNOSIS — M7989 Other specified soft tissue disorders: Secondary | ICD-10-CM | POA: Insufficient documentation

## 2012-06-10 ENCOUNTER — Encounter: Payer: Self-pay | Admitting: Vascular Surgery

## 2013-01-13 ENCOUNTER — Telehealth: Payer: Self-pay | Admitting: *Deleted

## 2013-01-13 MED ORDER — CIPROFLOXACIN HCL 250 MG PO TABS: 250.0000 mg | ORAL_TABLET | Freq: Two times a day (BID) | ORAL | Status: AC

## 2013-01-13 NOTE — Telephone Encounter (Signed)
cipro 250 bid 5 d 

## 2013-01-13 NOTE — Telephone Encounter (Signed)
Daughter called and stated patient has history of UTI- is complaining of urinary freq today- no fever-no dementia- Requests antibiotic to be called into [facility]. Daughter states it is really hard to get her to the office.

## 2013-01-13 NOTE — Telephone Encounter (Signed)
Med sent electronically to [facility]. Daughter notified.

## 2013-01-18 ENCOUNTER — Encounter: Payer: Self-pay | Admitting: *Deleted

## 2013-01-19 ENCOUNTER — Ambulatory Visit: Payer: Self-pay | Admitting: Family Medicine

## 2013-01-22 ENCOUNTER — Other Ambulatory Visit: Payer: Self-pay | Admitting: Family Medicine

## 2013-01-23 NOTE — Telephone Encounter (Signed)
Ok times 2 total 

## 2013-01-27 ENCOUNTER — Encounter (HOSPITAL_COMMUNITY): Payer: Self-pay | Admitting: *Deleted

## 2013-01-27 ENCOUNTER — Emergency Department (HOSPITAL_COMMUNITY)
Admission: EM | Admit: 2013-01-27 | Discharge: 2013-01-27 | Disposition: A | Discharge status: Home / Self Care | Payer: Medicare Other | Attending: Emergency Medicine | Admitting: Emergency Medicine

## 2013-01-27 DIAGNOSIS — F329 Major depressive disorder, single episode, unspecified: Secondary | ICD-10-CM | POA: Insufficient documentation

## 2013-01-27 DIAGNOSIS — Z7982 Long term (current) use of aspirin: Secondary | ICD-10-CM | POA: Insufficient documentation

## 2013-01-27 DIAGNOSIS — Z96659 Presence of unspecified artificial knee joint: Secondary | ICD-10-CM | POA: Insufficient documentation

## 2013-01-27 DIAGNOSIS — E7800 Pure hypercholesterolemia, unspecified: Secondary | ICD-10-CM | POA: Insufficient documentation

## 2013-01-27 DIAGNOSIS — E119 Type 2 diabetes mellitus without complications: Secondary | ICD-10-CM | POA: Insufficient documentation

## 2013-01-27 DIAGNOSIS — M129 Arthropathy, unspecified: Secondary | ICD-10-CM | POA: Insufficient documentation

## 2013-01-27 DIAGNOSIS — N189 Chronic kidney disease, unspecified: Secondary | ICD-10-CM | POA: Insufficient documentation

## 2013-01-27 DIAGNOSIS — I129 Hypertensive chronic kidney disease with stage 1 through stage 4 chronic kidney disease, or unspecified chronic kidney disease: Secondary | ICD-10-CM | POA: Insufficient documentation

## 2013-01-27 DIAGNOSIS — T50905A Adverse effect of unspecified drugs, medicaments and biological substances, initial encounter: Secondary | ICD-10-CM

## 2013-01-27 DIAGNOSIS — R21 Rash and other nonspecific skin eruption: Secondary | ICD-10-CM | POA: Insufficient documentation

## 2013-01-27 DIAGNOSIS — Z87440 Personal history of urinary (tract) infections: Secondary | ICD-10-CM | POA: Insufficient documentation

## 2013-01-27 DIAGNOSIS — F3289 Other specified depressive episodes: Secondary | ICD-10-CM | POA: Insufficient documentation

## 2013-01-27 DIAGNOSIS — Z79899 Other long term (current) drug therapy: Secondary | ICD-10-CM | POA: Insufficient documentation

## 2013-01-27 DIAGNOSIS — J45909 Unspecified asthma, uncomplicated: Secondary | ICD-10-CM | POA: Insufficient documentation

## 2013-01-27 DIAGNOSIS — Z96649 Presence of unspecified artificial hip joint: Secondary | ICD-10-CM | POA: Insufficient documentation

## 2013-01-27 DIAGNOSIS — K219 Gastro-esophageal reflux disease without esophagitis: Secondary | ICD-10-CM | POA: Insufficient documentation

## 2013-01-27 DIAGNOSIS — Z8701 Personal history of pneumonia (recurrent): Secondary | ICD-10-CM | POA: Insufficient documentation

## 2013-01-27 DIAGNOSIS — Z880 Allergy status to penicillin: Secondary | ICD-10-CM | POA: Insufficient documentation

## 2013-01-27 DIAGNOSIS — Z8679 Personal history of other diseases of the circulatory system: Secondary | ICD-10-CM | POA: Insufficient documentation

## 2013-01-27 DIAGNOSIS — Z853 Personal history of malignant neoplasm of breast: Secondary | ICD-10-CM | POA: Insufficient documentation

## 2013-01-27 DIAGNOSIS — T360X5A Adverse effect of penicillins, initial encounter: Secondary | ICD-10-CM | POA: Insufficient documentation

## 2013-01-27 MED ORDER — DIPHENHYDRAMINE HCL 25 MG PO TABS: 25.0000 mg | ORAL_TABLET | Freq: Three times a day (TID) | ORAL | Status: DC | PRN

## 2013-01-27 MED ORDER — DIPHENHYDRAMINE HCL 25 MG PO CAPS
25.0000 mg | ORAL_CAPSULE | Freq: Once | ORAL | Status: AC
  Administered 2013-01-27: 25 mg via ORAL
  Filled 2013-01-27: qty 1

## 2013-01-27 MED ORDER — PREDNISONE 20 MG PO TABS: ORAL_TABLET | ORAL | Status: DC

## 2013-01-27 MED ORDER — PREDNISONE 20 MG PO TABS
20.0000 mg | ORAL_TABLET | Freq: Once | ORAL | Status: AC
  Administered 2013-01-27: 20 mg via ORAL
  Filled 2013-01-27: qty 1

## 2013-01-27 NOTE — ED Notes (Signed)
Pt states she has been taking Penicillin for several days. Rash developed to face and arms yesterday. Pt states mouth is swollen but no obvious swelling to mouth or tongue. Denies SOB.

## 2013-01-27 NOTE — ED Provider Notes (Signed)
History     CSN: [id number]  Arrival date & time 01/27/13  1333   First MD Initiated Contact with Patient 01/27/13 1508      Chief Complaint  Patient presents with  . Rash  . Allergic Reaction    (Consider location/radiation/quality/duration/timing/severity/associated sxs/prior treatment) HPI Comments: [name] is a 79 y.o. female who presents to the Emergency Department complaining of red , itchy rash to her face and arms.  States the rash began on the day prior to ED arrival.  States that she has been taking amoxil for a recently extracted tooth and wasn't aware that ir was related to PCN.  States the rash appears to be spreading.  She has not tried any home medications or therapies.  She also denies shortness of breath, swelling , difficulty swallowing or breathing.  The history is provided by the patient.    Past Medical History  Diagnosis Date  . Breast cancer   . Hypertension   . Depression   . GERD (gastroesophageal reflux disease)   . Hypercholesteremia   . CKD (chronic kidney disease)   . Pneumonia 12/12/2011  . UTI (lower urinary tract infection) 12/12/2011  . Chronic kidney disease   . Shortness of breath   . Arthritis   . Asthma   . Reflux   . Venous stasis   . Diabetes mellitus without complication     Type 2    Past Surgical History  Procedure Laterality Date  . Replacement total knee    . Mastectomy    . Total hip arthroplasty    . Carotid endarterectomy    . Cataracts      No family history on file.  History  Substance Use Topics  . Smoking status: Never Smoker   . Smokeless tobacco: Never Used  . Alcohol Use: No    OB History   Grav Para Term Preterm Abortions TAB SAB Ect Mult Living                  Review of Systems  Constitutional: Negative for fever, chills, activity change and appetite change.  HENT: Negative for sore throat, facial swelling, trouble swallowing, neck pain and neck stiffness.   Respiratory: Negative for  chest tightness, shortness of breath and wheezing.   Skin: Positive for rash. Negative for wound.  Neurological: Negative for dizziness, weakness, numbness and headaches.  All other systems reviewed and are negative.    Allergies  Amoxicillin and Penicillins  Home Medications   Current Outpatient Rx  Name  Route  Sig  Dispense  Refill  . acetaminophen-codeine (TYLENOL #3) 300-30 MG per tablet   Oral   Take 1 tablet by mouth every 4 (four) hours as needed. For pain         . aliskiren (TEKTURNA) 150 MG tablet   Oral   Take 150 mg by mouth daily.         Marland Kitchen allopurinol (ZYLOPRIM) 100 MG tablet   Oral   Take 100 mg by mouth 2 (two) times daily.         Marland Kitchen amoxicillin (AMOXIL) 500 MG capsule   Oral   Take 500-2,000 mg by mouth 3 (three) times daily. Take 4 capsules on day 1 then take one capsule 4 times daily for 7 days         . aspirin 81 MG tablet   Oral   Take 81 mg by mouth daily.         Marland Kitchen  colchicine 0.6 MG tablet   Oral   Take 0.6 mg by mouth 2 (two) times daily.         . diphenhydrAMINE (BENADRYL) 25 MG tablet   Oral   Take 1 tablet (25 mg total) by mouth every 8 (eight) hours as needed for itching.   20 tablet   0   . furosemide (LASIX) 40 MG tablet   Oral   Take 40 mg by mouth 2 (two) times daily.         . metoprolol (LOPRESSOR) 50 MG tablet   Oral   Take 50 mg by mouth 2 (two) times daily.         . Multiple Vitamins-Minerals (ICAPS AREDS FORMULA PO)   Oral   Take 1 tablet by mouth 2 (two) times daily.         . pantoprazole (PROTONIX) 40 MG tablet   Oral   Take 40 mg by mouth daily.         Marland Kitchen PARoxetine (PAXIL) 20 MG tablet   Oral   Take 20 mg by mouth every morning.         . potassium chloride SA (K-DUR,KLOR-CON) 20 MEQ tablet   Oral   Take 20 mEq by mouth 2 (two) times daily.         . predniSONE (DELTASONE) 20 MG tablet      One tablet po qd x 4 days   4 tablet   0   . simvastatin (ZOCOR) 20 MG tablet    Oral   Take 20 mg by mouth daily.         . traMADol (ULTRAM) 50 MG tablet      TAKE (1) TO (2) TABLETS BY MOUTH EVERY (4) HOURS AS NEEDED FOR PAIN.   40 tablet   2     BP 163/68  Pulse 71  Temp(Src) 98.9 F (37.2 C) (Oral)  Resp 18  Ht 5\' 5"  (1.651 m)  Wt 214 lb (97.07 kg)  BMI 35.61 kg/m2  SpO2 98%  Physical Exam  Nursing note and vitals reviewed. Constitutional: She is oriented to person, place, and time. She appears well-developed and well-nourished. No distress.  HENT:  Head: Normocephalic and atraumatic.  Mouth/Throat: Uvula is midline, oropharynx is clear and moist and mucous membranes are normal. No edematous.  Airway patent  Neck: Normal range of motion. Neck supple.  Cardiovascular: Normal rate, regular rhythm, normal heart sounds and intact distal pulses.   No murmur heard. Pulmonary/Chest: Effort normal and breath sounds normal. No respiratory distress. She has no wheezes.  Musculoskeletal: Normal range of motion. She exhibits no edema and no tenderness.  Lymphadenopathy:    She has no cervical adenopathy.  Neurological: She is alert and oriented to person, place, and time. She exhibits normal muscle tone. Coordination normal.  Skin: Skin is warm and dry. Rash noted. There is erythema.  Erythematous slightly raised maculopapular rash to the face and bilateral upper extremities.  No edema of the face, tongue or lips.      ED Course  Procedures (including critical care time)  Labs Reviewed - No data to display No results found.   1. Drug reaction, initial encounter       MDM  VVS. Patient is alert, no airway compromise, no angioedema.  Rash likely secondary to recent PCN.  Patient took last dose on the day prior to arrival.  Advised to d/c the amoxil.  Will prescribe short course of steroids and benadryl  for itching, she agrees to close f/u with her PMD or to return here if needed.   Appears stable for d/c   Patient also seen by EDP, Dr. Juleen China prior  to d/c and care plan discussed.     Kannon Granderson L. Trisha Mangle, PA-C 01/31/13 1203

## 2013-02-01 NOTE — ED Provider Notes (Signed)
Medical screening examination/treatment/procedure(s) were conducted as a shared visit with non-physician practitioner(s) and myself.  I personally evaluated the patient during the encounter.  79 year old female with a nonspecific rash to her face, upper extremities and upper torso. She is hemodynamically stable. No evidence of any airway compromise. This may be a medication reaction. Patient seems pretty uncomfortable in terms of the pruritus. Despite her age, I think a short course of steroids would be of benefit. Patient started on her antibiotics for several days. The site of her tooth extraction is unremarkable. Do not feel that she needs continued antibiotics at this point. Emergent return precautions were discussed. Outpatient followup as needed otherwise.  Raeford Razor, MD 02/01/13 [phone]

## 2013-03-03 ENCOUNTER — Other Ambulatory Visit: Payer: Self-pay | Admitting: Family Medicine

## 2013-03-08 ENCOUNTER — Ambulatory Visit: Payer: Self-pay | Admitting: Family Medicine

## 2013-03-08 ENCOUNTER — Other Ambulatory Visit: Payer: Self-pay | Admitting: Family Medicine

## 2013-03-15 ENCOUNTER — Other Ambulatory Visit: Payer: Self-pay | Admitting: Family Medicine

## 2013-03-22 ENCOUNTER — Other Ambulatory Visit: Payer: Self-pay | Admitting: Family Medicine

## 2013-03-26 ENCOUNTER — Ambulatory Visit (INDEPENDENT_AMBULATORY_CARE_PROVIDER_SITE_OTHER): Payer: Medicare Other | Admitting: Family Medicine

## 2013-03-26 ENCOUNTER — Encounter: Payer: Self-pay | Admitting: Family Medicine

## 2013-03-26 VITALS — BP 150/80 | HR 70 | Wt 203.0 lb

## 2013-03-26 DIAGNOSIS — J45909 Unspecified asthma, uncomplicated: Secondary | ICD-10-CM

## 2013-03-26 DIAGNOSIS — M129 Arthropathy, unspecified: Secondary | ICD-10-CM

## 2013-03-26 DIAGNOSIS — J4520 Mild intermittent asthma, uncomplicated: Secondary | ICD-10-CM

## 2013-03-26 DIAGNOSIS — M1990 Unspecified osteoarthritis, unspecified site: Secondary | ICD-10-CM

## 2013-03-26 DIAGNOSIS — E119 Type 2 diabetes mellitus without complications: Secondary | ICD-10-CM

## 2013-03-26 DIAGNOSIS — Z79899 Other long term (current) drug therapy: Secondary | ICD-10-CM

## 2013-03-26 DIAGNOSIS — Z853 Personal history of malignant neoplasm of breast: Secondary | ICD-10-CM

## 2013-03-26 DIAGNOSIS — N390 Urinary tract infection, site not specified: Secondary | ICD-10-CM

## 2013-03-26 DIAGNOSIS — I10 Essential (primary) hypertension: Secondary | ICD-10-CM

## 2013-03-26 DIAGNOSIS — E785 Hyperlipidemia, unspecified: Secondary | ICD-10-CM

## 2013-03-26 LAB — POCT URINALYSIS DIPSTICK: pH, UA: 5

## 2013-03-26 LAB — POCT GLYCOSYLATED HEMOGLOBIN (HGB A1C): Hemoglobin A1C: 6.2

## 2013-03-26 NOTE — Progress Notes (Signed)
  Subjective:    Patient ID: [name], female    DOB: June 17, 1922, 79 y.o.   MRN: [id number]  HPI  Results for orders placed in visit on 03/26/13  POCT GLYCOSYLATED HEMOGLOBIN (HGB A1C)      Result Value Range   Hemoglobin A1C 6.2    POCT URINALYSIS DIPSTICK      Result Value Range   Color, UA       Clarity, UA       Glucose, UA       Bilirubin, UA       Ketones, UA       Spec Grav, UA 1.010     Blood, UA       pH, UA 5.0     Protein, UA       Urobilinogen, UA       Nitrite, UA       Leukocytes, UA       patient arrives office with tremendous number of concerns most relayed by her daughter.  Ongoing chronic knee pain. Fairly significant. Or so has said they cannot do anything for her. She uses a walker.  Despite diagnosis of diabetes patient continues to eat just about whatever she wants.  Daughter reports that she is frequently sleeping.  States reflux overall is stable.  Despite chronic cholesterol medicine he eats fats and greasy food.  No recent recurrence of gout.  Patient is in a difficult situation at home with husband chronically sick also. Family trying to get him to consider going into a supportive setting.  Patient notes dysuria wonders if she may have a UTI.    Review of Systems No chest pain no back pain no loss of consciousness no blood in stools ROS otherwise negative    Objective:   Physical Exam Alert significant obesity noted. HEENT normal. Blood pressure somewhat improved on repeat 142/80. Lungs clear. Heart regular rate and rhythm. Intertrigo rash noted. Abdomen benign. Ankles significant edema. Sensation intact pulses decent. Knees impressive crepitations.  Also there was a question of right axillary mass none evident on exam. Both axillary regions and breast an old scar exam and all normal     Assessment & Plan:  Impression 1 type 2 diabetes good control. #2 hyperlipidemia status uncertain. #3 hypertension clinically stable. #4  intertrigo rash. #5 dysuria no evidence of UTI #6 depression some still evident. Patient reluctant to take more medicines. #7 difficult social situation. 8 history of breast cancer clinically stable Plan diet exercise discussed. Appropriate blood work. Monistat cream twice a day to affected area. Culture urine. 35 minutes spent easily most in discussion. WSL check in 6 months.

## 2013-03-28 DIAGNOSIS — J45909 Unspecified asthma, uncomplicated: Secondary | ICD-10-CM | POA: Insufficient documentation

## 2013-03-28 DIAGNOSIS — E1121 Type 2 diabetes mellitus with diabetic nephropathy: Secondary | ICD-10-CM | POA: Insufficient documentation

## 2013-03-29 LAB — LIPID PANEL: HDL: 34 mg/dL — ABNORMAL LOW (ref >39)

## 2013-03-29 LAB — BASIC METABOLIC PANEL
CO2: 28 mEq/L (ref 19–32)
Calcium: 9.9 mg/dL (ref 8.4–10.5)
Creat: 1.47 mg/dL — ABNORMAL HIGH (ref 0.50–1.10)
Glucose, Bld: 122 mg/dL — ABNORMAL HIGH (ref 70–99)

## 2013-03-29 LAB — HEPATIC FUNCTION PANEL
AST: 24 U/L (ref 0–37)
Alkaline Phosphatase: 92 U/L (ref 39–117)
Bilirubin, Direct: 0.1 mg/dL (ref 0.0–0.3)
Indirect Bilirubin: 0.4 mg/dL (ref 0.0–0.9)
Total Bilirubin: 0.5 mg/dL (ref 0.3–1.2)

## 2013-05-14 ENCOUNTER — Other Ambulatory Visit: Payer: Self-pay | Admitting: Family Medicine

## 2013-05-26 ENCOUNTER — Encounter: Payer: Self-pay | Admitting: Family Medicine

## 2013-05-26 ENCOUNTER — Other Ambulatory Visit: Payer: Self-pay | Admitting: Family Medicine

## 2013-05-26 ENCOUNTER — Ambulatory Visit (INDEPENDENT_AMBULATORY_CARE_PROVIDER_SITE_OTHER): Payer: Medicare Other | Admitting: Family Medicine

## 2013-05-26 VITALS — BP 158/80 | Temp 98.3°F | Ht 65.0 in

## 2013-05-26 DIAGNOSIS — R600 Localized edema: Secondary | ICD-10-CM

## 2013-05-26 DIAGNOSIS — M1710 Unilateral primary osteoarthritis, unspecified knee: Secondary | ICD-10-CM

## 2013-05-26 DIAGNOSIS — N390 Urinary tract infection, site not specified: Secondary | ICD-10-CM

## 2013-05-26 DIAGNOSIS — M1711 Unilateral primary osteoarthritis, right knee: Secondary | ICD-10-CM

## 2013-05-26 DIAGNOSIS — Z23 Encounter for immunization: Secondary | ICD-10-CM

## 2013-05-26 DIAGNOSIS — R300 Dysuria: Secondary | ICD-10-CM

## 2013-05-26 DIAGNOSIS — R609 Edema, unspecified: Secondary | ICD-10-CM

## 2013-05-26 LAB — POCT URINALYSIS DIPSTICK

## 2013-05-26 MED ORDER — KETOCONAZOLE 2 % EX CREA: TOPICAL_CREAM | Freq: Two times a day (BID) | CUTANEOUS | Status: DC

## 2013-05-26 MED ORDER — NITROFURANTOIN MONOHYD MACRO 100 MG PO CAPS: 100.0000 mg | ORAL_CAPSULE | Freq: Two times a day (BID) | ORAL | Status: AC

## 2013-05-26 NOTE — Progress Notes (Signed)
  Subjective:    Patient ID: [name], female    DOB: [birth year], 79 y.o.   MRN: [id number]  HPIright leg swelling yesterday. Had swelling yesterday had been sitting up a lot. Family took his shoe off of her proper legs up swelling seemed to go down but they were concerned. Patient did complain of some foot pain but no calf pain no redness  Right knee pain yesterday. Has had intermittent knee pain over the past few weeks but the pain is worse over the past day. Trying Tylenol for that seems to help.  Under right arm pain. Had pain underneath her right arm family was concerned about a growth. They weren't sure if it was that or growth that they were feeling.  Odor to urine noticied yesterday and frequent urination. They denied any fever but related that the urine is been very strong they collected some home and brought him in to be looked at  Also having a lot of itching in the vaginal area present over the past week. Appetite has been good no vomiting or diarrhea no high fever chills no sweats nausea vomiting. No chest tightness pressure pain or shortness breath. Past medical history was reviewed please see problem list as well as past medical history in the electronic chart.    Review of Systems See above.    Objective:   Physical Exam Eardrums are normal neck no masses lungs are clear there is no crackles heart is regular abdomen soft obese examination done underneath the right axilla no mass was felt this is more of a fat pad there are feeling I reassured the family also right knee osteoarthritis Tylenol would be appropriate for this there is no tenderness in the calf no tenderness in the ankle I find no evidence of DVT. I don't recommend any type of x-rays ultrasounds her lab work. In addition to this there is some redness around the vaginal area in the abdomen is soft       Assessment & Plan:  Knee pain-Tylenol Foot pain-resolved swelling is going down no need for any  tests Urinary tract infection Macrobid twice a day for 7 days I don't recommend a culture since the urine was brought to [place] in the container that was not sterile Vaginal itching ketoconazole as directed Right axilla discomfort-watch closely no sign of any type of blood clot or infection currently. Reassurance given. 25 minutes spent with the family covering all of these issues

## 2013-05-27 ENCOUNTER — Telehealth: Payer: Self-pay | Admitting: Family Medicine

## 2013-05-27 NOTE — Telephone Encounter (Signed)
Macrobid was given for urinary tract infection. This was gone over in detail with the patient and the lady who was with her at the visit. Also it should be on the printout that was given at the end of the visit. It is a urinary tract infection. If high fevers vomiting severe abdominal pain or other problems arise that is a sign of something else and therefore would need to be seen or seen in the ER. Thank you.

## 2013-05-27 NOTE — Telephone Encounter (Signed)
Dennie Bible (daughter) would like to talk with the nurse that was with her Mom during yesterday's (05/26/2013) visit.  She wants to know about the antibiotic that was given.  What degree of Infection does she have?  Mom is unable to answer the daughters questions.

## 2013-05-27 NOTE — Telephone Encounter (Signed)
Notified daughter Dennie Bible) macrobid was given for urinary tract infection. This was gone over in detail with the patient and the lady who was with her at the visit. Also it should be on the printout that was given at the end of the visit. It is a urinary tract infection. If high fevers vomiting severe abdominal pain or other problems arise that is a sign of something else and therefore would need to be seen or seen in the ER. She verbalized understanding.

## 2013-05-31 ENCOUNTER — Ambulatory Visit (INDEPENDENT_AMBULATORY_CARE_PROVIDER_SITE_OTHER): Payer: Medicare Other | Admitting: Family Medicine

## 2013-05-31 VITALS — BP 122/88 | Temp 98.4°F | Ht 65.5 in | Wt 203.0 lb

## 2013-05-31 DIAGNOSIS — J209 Acute bronchitis, unspecified: Secondary | ICD-10-CM

## 2013-05-31 MED ORDER — LEVOFLOXACIN 500 MG PO TABS: 500.0000 mg | ORAL_TABLET | Freq: Every day | ORAL | Status: AC

## 2013-05-31 MED ORDER — CEFPROZIL 500 MG PO TABS: 500.0000 mg | ORAL_TABLET | Freq: Two times a day (BID) | ORAL | Status: DC

## 2013-05-31 NOTE — Progress Notes (Signed)
  Subjective:    Patient ID: [name], female    DOB: Jan 22, 1922, 79 y.o.   MRN: [id number]  Cough This is a new problem. The current episode started 1 to 4 weeks ago. Associated symptoms comments: congestion.   Still on antibiotic for UTI.   Right leg swelling.   Needs form signed for Do Not Resuscitate order.  Patient also expresses a distinct desire not to BE resuscitated in the event a natural death. Family is currently seeking an attorney in do a living Will. In the meantime the patient request that we fill out a stop resuscitation form for any EMS folks that may arrive at their home. Currently her husband is in hospice care diet of prostate cancer and has clarified this with his specialist. Has noted family work in a legal matters, and patient asks that we for DO NOT RESUSCITATE orders were both she and her husband who is a patient of mine.  Review of Systems  Respiratory: Positive for cough.    no fever no chest pain no abdominal pain positive malaise     Objective:   Physical Exam  Alert mild malaise vital stable lungs clear intermittent cough during exam heart regular in rhythm HET Mondays congestion      Assessment & Plan:  Impression bronchitis currently under treatment for UTI. Plan stop Macrobid rationale discussed DO NOT RESUSCITATE stop order form filled out for household. Start Cefzil twice a day 10 days. Symptomatic care discussed. WSL

## 2013-06-28 ENCOUNTER — Other Ambulatory Visit: Payer: Self-pay | Admitting: Family Medicine

## 2013-07-06 ENCOUNTER — Telehealth: Payer: Self-pay | Admitting: *Deleted

## 2013-07-06 ENCOUNTER — Other Ambulatory Visit: Payer: Self-pay | Admitting: *Deleted

## 2013-07-06 MED ORDER — ZOLPIDEM TARTRATE 5 MG PO TABS: 5.0000 mg | ORAL_TABLET | Freq: Every evening | ORAL | Status: DC | PRN

## 2013-07-06 NOTE — Telephone Encounter (Signed)
I spoke with pt. Ambien 5 mg numb 24. One qhs prn sleep

## 2013-07-06 NOTE — Telephone Encounter (Signed)
Med faxed to [facility].

## 2013-07-06 NOTE — Telephone Encounter (Signed)
Pt's husband passed away last night. Patient asked pharm to call and request something to help her rest. [facility].

## 2013-08-07 ENCOUNTER — Other Ambulatory Visit: Payer: Self-pay | Admitting: Family Medicine

## 2013-09-06 ENCOUNTER — Encounter: Payer: Self-pay | Admitting: Family Medicine

## 2013-09-06 ENCOUNTER — Ambulatory Visit (INDEPENDENT_AMBULATORY_CARE_PROVIDER_SITE_OTHER): Payer: Medicare Other | Admitting: Family Medicine

## 2013-09-06 VITALS — BP 110/82 | Ht 65.5 in | Wt 218.2 lb

## 2013-09-06 DIAGNOSIS — J45909 Unspecified asthma, uncomplicated: Secondary | ICD-10-CM

## 2013-09-06 DIAGNOSIS — N393 Stress incontinence (female) (male): Secondary | ICD-10-CM

## 2013-09-06 DIAGNOSIS — G5600 Carpal tunnel syndrome, unspecified upper limb: Secondary | ICD-10-CM

## 2013-09-06 DIAGNOSIS — E782 Mixed hyperlipidemia: Secondary | ICD-10-CM

## 2013-09-06 DIAGNOSIS — G5601 Carpal tunnel syndrome, right upper limb: Secondary | ICD-10-CM

## 2013-09-06 DIAGNOSIS — E785 Hyperlipidemia, unspecified: Secondary | ICD-10-CM

## 2013-09-06 DIAGNOSIS — M1990 Unspecified osteoarthritis, unspecified site: Secondary | ICD-10-CM

## 2013-09-06 DIAGNOSIS — I10 Essential (primary) hypertension: Secondary | ICD-10-CM

## 2013-09-06 DIAGNOSIS — E119 Type 2 diabetes mellitus without complications: Secondary | ICD-10-CM

## 2013-09-06 DIAGNOSIS — R609 Edema, unspecified: Secondary | ICD-10-CM

## 2013-09-06 DIAGNOSIS — Z79899 Other long term (current) drug therapy: Secondary | ICD-10-CM

## 2013-09-06 DIAGNOSIS — M129 Arthropathy, unspecified: Secondary | ICD-10-CM

## 2013-09-06 LAB — POCT GLYCOSYLATED HEMOGLOBIN (HGB A1C): HEMOGLOBIN A1C: 6.2

## 2013-09-06 MED ORDER — FLUCONAZOLE 150 MG PO TABS: ORAL_TABLET | ORAL | Status: DC

## 2013-09-06 MED ORDER — OXYBUTYNIN CHLORIDE ER 5 MG PO TB24: 5.0000 mg | ORAL_TABLET | Freq: Every day | ORAL | Status: DC

## 2013-09-06 NOTE — Progress Notes (Signed)
   Subjective:    Patient ID: [name], female    DOB: 20-Jan-1922, 80 y.o.   MRN: [id number]  Diabetes She presents for her follow-up diabetic visit. She has type 2 diabetes mellitus. Her disease course has been stable. There are no hypoglycemic associated symptoms. There are no diabetic associated symptoms. There are no hypoglycemic complications. Symptoms are stable. There are no diabetic complications. There are no known risk factors for coronary artery disease. Current diabetic treatment includes diet. She is compliant with treatment all of the time.  A1C today is 6.2.   Patient is having trouble hearing at times. Patient complains of urinary incontinence and vaginal itching. Daughter states she does not think it is a UTI because this is an ongoing issue.   Continues to have right hand numbness at times. Also weakness in that hand at times. Diagnosed with carpal tunnel syndrome in the past. Spotty use of nighttime bracing.  Patient was seen by another primary care doctor elsewhere. Patient's diuretics were increased. Was told that she had "fluid on the lungs"  Patient notes some trouble with her hearing. History of wax in ears.  Challenge with pruritus at times. Often with itching and burning in urethral region.  Compliant with blood pressure medicines. As noted Lasix was increased. No followup blood work was done.  Ongoing challenges with arthritis in the legs clinically improved since moving in with her daughter.  Also status post recent loss of spouse. Which has understandably affected the patient.  Ongoing difficulty with urinary loss. Occurs with coughing and/or sneezing.  Review of Systems No headache no chest pain no abdominal pain no change in bowel habits no blood in stool ROS otherwise negative    Objective:   Physical Exam  HEENT TMs do have considerable wax bilateral. Chronic nasal congestion noted. Vital stable. Lungs clear currently. Heart regular rate and  rhythm. Ankles no significant edema. Knees positive crepitations noted. Right hand sensation somewhat diminished and carpal tunnel pattern.      Assessment & Plan:  Impression 1 type 2 diabetes good control. #2 stress incontinence discuss what medication. #3 reflux clinically stable. #4 hyperlipidemia status uncertain. #5 vaginal pruritus presumably from yeast infection. #6 cerumen impaction discussed. #7 "fluid in the lungs" I really am not sure what this means. Will assess renal status with new diuretic dosage. #8 carpal tunnel syndrome. #9 knee arthritis clinically stable. #10 hypertension good control. Plan add Ditropan. Check appropriate blood work. Check every 3 months. Have ears irrigated. Or so consult. Easily 40 minutes spent with patient most in discussion.

## 2013-09-10 ENCOUNTER — Ambulatory Visit (INDEPENDENT_AMBULATORY_CARE_PROVIDER_SITE_OTHER): Payer: Medicare Other | Admitting: *Deleted

## 2013-09-10 DIAGNOSIS — H6120 Impacted cerumen, unspecified ear: Secondary | ICD-10-CM

## 2013-09-10 DIAGNOSIS — H6123 Impacted cerumen, bilateral: Secondary | ICD-10-CM

## 2013-09-10 LAB — BASIC METABOLIC PANEL
BUN: 48 mg/dL — ABNORMAL HIGH (ref 6–23)
CHLORIDE: 104 meq/L (ref 96–112)
CO2: 29 mEq/L (ref 19–32)
Calcium: 9.6 mg/dL (ref 8.4–10.5)
Creat: 1.35 mg/dL — ABNORMAL HIGH (ref 0.50–1.10)
GLUCOSE: 125 mg/dL — AB (ref 70–99)
POTASSIUM: 5 meq/L (ref 3.5–5.3)
SODIUM: 142 meq/L (ref 135–145)

## 2013-09-10 LAB — HEPATIC FUNCTION PANEL
ALBUMIN: 4.1 g/dL (ref 3.5–5.2)
ALT: 15 U/L (ref 0–35)
AST: 22 U/L (ref 0–37)
Alkaline Phosphatase: 87 U/L (ref 39–117)
BILIRUBIN TOTAL: 0.9 mg/dL (ref 0.3–1.2)
Bilirubin, Direct: 0.2 mg/dL (ref 0.0–0.3)
Indirect Bilirubin: 0.7 mg/dL (ref 0.0–0.9)
Total Protein: 7 g/dL (ref 6.0–8.3)

## 2013-09-10 LAB — LIPID PANEL
Cholesterol: 157 mg/dL (ref 0–200)
HDL: 32 mg/dL — AB (ref >39)
LDL CALC: 90 mg/dL (ref 0–99)
Total CHOL/HDL Ratio: 4.9 Ratio
Triglycerides: 176 mg/dL — ABNORMAL HIGH (ref <150)
VLDL: 35 mg/dL (ref 0–40)

## 2013-09-10 NOTE — Progress Notes (Signed)
   Subjective:    Patient ID: [name], female    DOB: 04-Feb-1922, 80 y.o.   MRN: [id number]  HPI Patient tolerated ear wax removal well.   Review of Systems     Objective:   Physical Exam        Assessment & Plan:

## 2013-09-11 LAB — BRAIN NATRIURETIC PEPTIDE: Brain Natriuretic Peptide: 131.6 pg/mL — ABNORMAL HIGH (ref 0.0–100.0)

## 2013-09-21 ENCOUNTER — Telehealth: Payer: Self-pay | Admitting: Family Medicine

## 2013-09-21 ENCOUNTER — Ambulatory Visit (INDEPENDENT_AMBULATORY_CARE_PROVIDER_SITE_OTHER): Payer: Medicare Other | Admitting: *Deleted

## 2013-09-21 DIAGNOSIS — Z111 Encounter for screening for respiratory tuberculosis: Secondary | ICD-10-CM

## 2013-09-21 NOTE — Telephone Encounter (Signed)
Please fill out FL2

## 2013-09-23 LAB — TB SKIN TEST: TB Skin Test: NEGATIVE

## 2013-09-28 ENCOUNTER — Encounter: Payer: Self-pay | Admitting: Family Medicine

## 2013-09-28 ENCOUNTER — Ambulatory Visit (INDEPENDENT_AMBULATORY_CARE_PROVIDER_SITE_OTHER): Payer: Medicare Other | Admitting: Family Medicine

## 2013-09-28 VITALS — BP 154/86 | Temp 98.7°F | Ht 65.5 in | Wt 218.0 lb

## 2013-09-28 DIAGNOSIS — R1314 Dysphagia, pharyngoesophageal phase: Secondary | ICD-10-CM

## 2013-09-28 DIAGNOSIS — J209 Acute bronchitis, unspecified: Secondary | ICD-10-CM

## 2013-09-28 MED ORDER — LEVOFLOXACIN 500 MG PO TABS: 500.0000 mg | ORAL_TABLET | Freq: Every day | ORAL | Status: AC

## 2013-09-28 MED ORDER — AEROCHAMBER MV MISC: Status: DC

## 2013-09-28 MED ORDER — ALBUTEROL SULFATE HFA 108 (90 BASE) MCG/ACT IN AERS: 2.0000 | INHALATION_SPRAY | Freq: Four times a day (QID) | RESPIRATORY_TRACT | Status: DC | PRN

## 2013-09-28 NOTE — Progress Notes (Signed)
   Subjective:    Patient ID: [name], female    DOB: [birth year], 80 y.o.   MRN: [id number]  Cough This is a new problem. The current episode started in the past 7 days. The cough is productive of sputum. Associated symptoms include a fever, nasal congestion, rhinorrhea, a sore throat and wheezing. She has tried OTC cough suppressant for the symptoms. The treatment provided no relief.    Breathing soundiiing congested  Wheezing at times  Bronchial breathing  Towards the end of the visit patient's daughter also brings up the patient has been having a lot of difficulty swallowing solid foods at times. Once or twice a week will become very Job. He shot I to solid foods. Patient's daughter feels needs further assessment. Review of Systems  Constitutional: Positive for fever.  HENT: Positive for rhinorrhea and sore throat.   Respiratory: Positive for cough and wheezing.    No vomiting no diarrhea no rash ROS otherwise negative    Objective:   Physical Exam  Alert hydration good. Lungs no crackles no true wheezes except with cough heart rare rhythm H&T normal ankles without edema      Assessment & Plan:  Impression bronchitis with element of reactive airways #2 solid food dysphagia by history. Plan speech therapy consult. Levaquin daily for 10 days. Albuterol 2 sprays every 4-6 when necessary. Symptomatic care discussed. WSL

## 2013-10-01 ENCOUNTER — Telehealth: Payer: Self-pay | Admitting: Family Medicine

## 2013-10-01 NOTE — Telephone Encounter (Signed)
Fraser Din brought in another FL2 to be signed, the original had Ms Clinard's meds incorrect  So the nurse rewrote the [facility] for you to sign, see attached to chart the new FL2 and the old for you  To see the changes made.   As well as they would like the DNR's attached to be signed to have extra copies on hand at the Nursing home  Very important for this to be done today, she is to enter the home this weekend and they won't let her without the  Correct FL2

## 2013-10-03 DIAGNOSIS — R1314 Dysphagia, pharyngoesophageal phase: Secondary | ICD-10-CM | POA: Insufficient documentation

## 2013-10-22 ENCOUNTER — Other Ambulatory Visit (HOSPITAL_COMMUNITY): Payer: Self-pay | Admitting: Geriatric Medicine

## 2013-10-22 DIAGNOSIS — R1310 Dysphagia, unspecified: Secondary | ICD-10-CM

## 2013-10-25 ENCOUNTER — Other Ambulatory Visit: Payer: Self-pay | Admitting: Family Medicine

## 2013-10-25 DIAGNOSIS — I10 Essential (primary) hypertension: Secondary | ICD-10-CM

## 2013-10-25 DIAGNOSIS — N390 Urinary tract infection, site not specified: Secondary | ICD-10-CM

## 2013-10-25 DIAGNOSIS — M1710 Unilateral primary osteoarthritis, unspecified knee: Secondary | ICD-10-CM

## 2013-10-25 NOTE — Telephone Encounter (Signed)
[name] both times one mo family chose a new house call physician service and they will need to do future refills

## 2013-10-27 ENCOUNTER — Other Ambulatory Visit: Payer: Self-pay | Admitting: Family Medicine

## 2013-10-27 NOTE — Telephone Encounter (Signed)
Is this ok to refill? Not sure if she is still a patient here or not.

## 2013-10-28 ENCOUNTER — Ambulatory Visit (HOSPITAL_COMMUNITY)
Admission: RE | Admit: 2013-10-28 | Discharge: 2013-10-28 | Disposition: A | Discharge status: Home / Self Care | Payer: Medicare Other | Source: Ambulatory Visit | Attending: Geriatric Medicine | Admitting: Geriatric Medicine

## 2013-10-28 ENCOUNTER — Inpatient Hospital Stay (HOSPITAL_COMMUNITY)
Admission: RE | Admit: 2013-10-28 | Discharge: 2013-10-28 | Disposition: A | Discharge status: Home / Self Care | Payer: Medicare Other | Source: Ambulatory Visit | Attending: Speech Pathology | Admitting: Speech Pathology

## 2013-10-28 DIAGNOSIS — R1313 Dysphagia, pharyngeal phase: Secondary | ICD-10-CM | POA: Insufficient documentation

## 2013-10-28 DIAGNOSIS — R1310 Dysphagia, unspecified: Secondary | ICD-10-CM

## 2013-10-28 NOTE — Procedures (Signed)
Objective Swallowing Evaluation: Modified Barium Swallowing Study  Patient Details  Name: [name] MRN: [id number] Date of Birth: [birth year]  Today's Date: 10/28/2013 Time: 1:00 PM  - 2:00 PM    Past Medical History:  Past Medical History  Diagnosis Date  . Breast cancer   . Hypertension   . Depression   . GERD (gastroesophageal reflux disease)   . Hypercholesteremia   . CKD (chronic kidney disease)   . Pneumonia 12/12/2011  . UTI (lower urinary tract infection) 12/12/2011  . Chronic kidney disease   . Shortness of breath   . Arthritis   . Asthma   . Reflux   . Venous stasis   . Diabetes mellitus without complication     Type 2   Past Surgical History:  Past Surgical History  Procedure Laterality Date  . Replacement total knee    . Mastectomy    . Total hip arthroplasty    . Carotid endarterectomy    . Cataracts     HPI:  Mrs. [name] is an 80 yo female resident at the [facility] who was referred by Dr. Fredderick Phenix for MBSS due to pt/family complaints of frequent choking/coughing episodes during meals and frequent upper respiratory infections. Her daughter, Fraser Din, accompanied her to the appointment and provided some background information. She reports that her mother always eats very quickly and has coughed for years during meals.  Symptoms/Limitations Symptoms: Daughter reports frequent coughing/choking episodes during meals. Special Tests: MBSS  Recommendation/Prognosis  Clinical Impression:   Dysphagia Diagnosis: Mild pharyngeal phase dysphagia Clinical impression: Swallow function essentially within functional limits when pt goes slowly and takes only one bite/sip at a time. Mild premature spillage noted with multiple cup sips thin with swallow trigger after filling the valleculae. Flash penetration occurred on about half of trials which elicited throat clearing and coughing (coughing seemed out of proportion to the trace penetration that was  observed). No aspiration or residuals noted. Pt requires verbal cues to "slow down" and "take one sip only". When she adheres to rate control and bolus size, she had no issues. The study with reviewed with pt and her daughter, Fraser Din and both acknowlege understanding of strategies. Fraser Din was very clear to say that her mother always eats rapidly. It will be difficult, but necessary to curb old eating habits. Advised pt and daughter for pt to set fork and cup down inbetween bites/sips. Pt penetrated the liquids when tipping her head back and taking multiple swallows from the cup. Straw sips ok with head in neutral to down position and taking single sips only. Daughter is hopeful that this will clear up her mother's coughing and congestion. This may be part of it, but may also be influenced by other factors like reflux and post nasal drip. Pt will need guidance from SLP during meals initially for meal observation and implementation of strategies.    Swallow Evaluation Recommendations:  Diet Recommendations: Regular;Thin liquid Liquid Administration via: Cup;Straw (chin neutral to down) Medication Administration: Whole meds with puree (pt preference) Supervision: Full supervision/cueing for compensatory strategies Compensations: Slow rate;Small sips/bites Postural Changes and/or Swallow Maneuvers: Out of bed for meals;Seated upright 90 degrees;Upright 30-60 min after meal (chin neutral to down) Oral Care Recommendations: Oral care BID Other Recommendations: Clarify dietary restrictions Follow up Recommendations: Home health SLP    Prognosis:  Prognosis for Safe Diet Advancement: Good   Individuals Consulted: Consulted and Agree with Results and Recommendations: Patient;Family member/caregiver Family Member Consulted: daughter Report Sent to :  Referring physician;Facility (Comment)      SLP Assessment/Plan Clinical Impression:   Dysphagia Diagnosis: Mild pharyngeal phase dysphagia Clinical  impression: Swallow function essentially within functional limits when pt goes slowly and takes only one bite/sip at a time. Mild premature spillage noted with multiple cup sips thin with swallow trigger after filling the valleculae. Flash penetration occurred on about half of trials which elicited throat clearing and coughing (coughing seemed out of proportion to the trace penetration that was observed). No aspiration or residuals noted. Pt requires verbal cues to "slow down" and "take one sip only". When she adheres to rate control and bolus size, she had no issues. The study with reviewed with pt and her daughter, Fraser Din and both acknowlege understanding of strategies. Fraser Din was very clear to say that her mother always eats rapidly. It will be difficult, but necessary to curb old eating habits. Advised pt and daughter for pt to set fork and cup down inbetween bites/sips. Pt penetrated the liquids when tipping her head back and taking multiple swallows from the cup. Straw sips ok with head in neutral to down position and taking single sips only. Daughter is hopeful that this will clear up her mother's coughing and congestion. This may be part of it, but may also be influenced by other factors like reflux and post nasal drip. Pt will need guidance from SLP during meals initially for meal observation and implementation of strategies.    Plan:   F/U with SLP at [facility]: Date of Onset: 10/15/13 Type of Study: Modified Barium Swallowing Study Reason for Referral: Objectively evaluate swallowing function Previous Swallow Assessment: None on record Diet Prior to this Study: Regular;Thin liquids Temperature Spikes Noted: No Respiratory Status: Room air History of Recent Intubation: No Behavior/Cognition: Alert;Cooperative Oral Cavity - Dentition: Adequate natural dentition Oral Motor / Sensory Function: Within functional limits Self-Feeding Abilities: Able to feed self Patient  Positioning: Upright in chair Baseline Vocal Quality: Clear;Hoarse Volitional Cough: Strong;Congested Volitional Swallow: Able to elicit Anatomy: Within functional limits Pharyngeal Secretions: Not observed secondary MBS   Reason for Referral:   Objectively evaluate swallowing function    Oral Phase: Oral Preparation/Oral Phase Oral Phase: WFL (mild difficulty with pill A-P transit)   Pharyngeal Phase:  Pharyngeal Phase Pharyngeal Phase: Impaired Pharyngeal - Thin Pharyngeal - Thin Teaspoon: Within functional limits Pharyngeal - Thin Cup: Delayed swallow initiation;Premature spillage to valleculae;Reduced epiglottic inversion;Penetration/Aspiration during swallow Penetration/Aspiration details (thin cup): Material does not enter airway;Material enters airway, remains ABOVE vocal cords then ejected out Pharyngeal - Thin Straw: Within functional limits Pharyngeal - Solids Pharyngeal - Puree: Within functional limits Pharyngeal - Regular: Within functional limits Pharyngeal - Pill: Premature spillage to valleculae Pharyngeal Phase - Comment Pharyngeal Comment: Penetration noted (underepiglottic coating) with multiple and large sips thin. Strong cough responses elicited.   Cervical Esophageal Phase  Cervical Esophageal Phase Cervical Esophageal Phase: WFL    G Codes: MBSS, swallowing, CI, CI, CI  Thank you,  Genene Churn, [place]   PORTER,DABNEY 10/28/2013, 2:38 PM

## 2013-10-29 ENCOUNTER — Other Ambulatory Visit: Payer: Self-pay | Admitting: Family Medicine

## 2013-11-08 ENCOUNTER — Other Ambulatory Visit: Payer: Self-pay | Admitting: Family Medicine

## 2013-11-22 ENCOUNTER — Other Ambulatory Visit: Payer: Self-pay | Admitting: Family Medicine

## 2013-11-22 NOTE — Telephone Encounter (Signed)
plz call nursing center. This pt left our practice several weeks ago!!! We have been trying to get all parties to stop contacting us and [facility] said it would stop. As of today, we can no longer provide any ref or rx's or authorizations etc

## 2013-12-10 ENCOUNTER — Encounter (HOSPITAL_COMMUNITY): Payer: Self-pay | Admitting: Emergency Medicine

## 2013-12-10 ENCOUNTER — Inpatient Hospital Stay (HOSPITAL_COMMUNITY)
Admission: EM | Admit: 2013-12-10 | Discharge: 2013-12-14 | DRG: 291 | Disposition: A | Discharge status: Home Health | Payer: Medicare Other | Attending: Internal Medicine | Admitting: Internal Medicine

## 2013-12-10 ENCOUNTER — Emergency Department (HOSPITAL_COMMUNITY): Payer: Medicare Other

## 2013-12-10 DIAGNOSIS — Z79899 Other long term (current) drug therapy: Secondary | ICD-10-CM

## 2013-12-10 DIAGNOSIS — N393 Stress incontinence (female) (male): Secondary | ICD-10-CM

## 2013-12-10 DIAGNOSIS — J9600 Acute respiratory failure, unspecified whether with hypoxia or hypercapnia: Secondary | ICD-10-CM

## 2013-12-10 DIAGNOSIS — R1314 Dysphagia, pharyngoesophageal phase: Secondary | ICD-10-CM

## 2013-12-10 DIAGNOSIS — F3289 Other specified depressive episodes: Secondary | ICD-10-CM | POA: Diagnosis present

## 2013-12-10 DIAGNOSIS — J441 Chronic obstructive pulmonary disease with (acute) exacerbation: Secondary | ICD-10-CM

## 2013-12-10 DIAGNOSIS — I10 Essential (primary) hypertension: Secondary | ICD-10-CM

## 2013-12-10 DIAGNOSIS — I509 Heart failure, unspecified: Secondary | ICD-10-CM

## 2013-12-10 DIAGNOSIS — Z825 Family history of asthma and other chronic lower respiratory diseases: Secondary | ICD-10-CM

## 2013-12-10 DIAGNOSIS — Z833 Family history of diabetes mellitus: Secondary | ICD-10-CM

## 2013-12-10 DIAGNOSIS — E119 Type 2 diabetes mellitus without complications: Secondary | ICD-10-CM

## 2013-12-10 DIAGNOSIS — K219 Gastro-esophageal reflux disease without esophagitis: Secondary | ICD-10-CM | POA: Diagnosis present

## 2013-12-10 DIAGNOSIS — M25669 Stiffness of unspecified knee, not elsewhere classified: Secondary | ICD-10-CM

## 2013-12-10 DIAGNOSIS — N189 Chronic kidney disease, unspecified: Secondary | ICD-10-CM

## 2013-12-10 DIAGNOSIS — I872 Venous insufficiency (chronic) (peripheral): Secondary | ICD-10-CM | POA: Diagnosis present

## 2013-12-10 DIAGNOSIS — E785 Hyperlipidemia, unspecified: Secondary | ICD-10-CM

## 2013-12-10 DIAGNOSIS — R262 Difficulty in walking, not elsewhere classified: Secondary | ICD-10-CM

## 2013-12-10 DIAGNOSIS — J45901 Unspecified asthma with (acute) exacerbation: Secondary | ICD-10-CM

## 2013-12-10 DIAGNOSIS — F329 Major depressive disorder, single episode, unspecified: Secondary | ICD-10-CM | POA: Diagnosis present

## 2013-12-10 DIAGNOSIS — N390 Urinary tract infection, site not specified: Secondary | ICD-10-CM

## 2013-12-10 DIAGNOSIS — Z96649 Presence of unspecified artificial hip joint: Secondary | ICD-10-CM

## 2013-12-10 DIAGNOSIS — I5032 Chronic diastolic (congestive) heart failure: Secondary | ICD-10-CM | POA: Diagnosis present

## 2013-12-10 DIAGNOSIS — G5600 Carpal tunnel syndrome, unspecified upper limb: Secondary | ICD-10-CM

## 2013-12-10 DIAGNOSIS — E1121 Type 2 diabetes mellitus with diabetic nephropathy: Secondary | ICD-10-CM | POA: Diagnosis present

## 2013-12-10 DIAGNOSIS — M129 Arthropathy, unspecified: Secondary | ICD-10-CM | POA: Diagnosis present

## 2013-12-10 DIAGNOSIS — E7800 Pure hypercholesterolemia, unspecified: Secondary | ICD-10-CM | POA: Diagnosis present

## 2013-12-10 DIAGNOSIS — I5033 Acute on chronic diastolic (congestive) heart failure: Principal | ICD-10-CM

## 2013-12-10 DIAGNOSIS — Z7982 Long term (current) use of aspirin: Secondary | ICD-10-CM

## 2013-12-10 DIAGNOSIS — J45909 Unspecified asthma, uncomplicated: Secondary | ICD-10-CM

## 2013-12-10 DIAGNOSIS — I129 Hypertensive chronic kidney disease with stage 1 through stage 4 chronic kidney disease, or unspecified chronic kidney disease: Secondary | ICD-10-CM | POA: Diagnosis present

## 2013-12-10 DIAGNOSIS — Z66 Do not resuscitate: Secondary | ICD-10-CM | POA: Diagnosis present

## 2013-12-10 DIAGNOSIS — Z853 Personal history of malignant neoplasm of breast: Secondary | ICD-10-CM

## 2013-12-10 DIAGNOSIS — N183 Chronic kidney disease, stage 3 unspecified: Secondary | ICD-10-CM | POA: Diagnosis present

## 2013-12-10 DIAGNOSIS — M25569 Pain in unspecified knee: Secondary | ICD-10-CM

## 2013-12-10 DIAGNOSIS — Z993 Dependence on wheelchair: Secondary | ICD-10-CM

## 2013-12-10 DIAGNOSIS — J189 Pneumonia, unspecified organism: Secondary | ICD-10-CM

## 2013-12-10 DIAGNOSIS — M1990 Unspecified osteoarthritis, unspecified site: Secondary | ICD-10-CM

## 2013-12-10 DIAGNOSIS — R29898 Other symptoms and signs involving the musculoskeletal system: Secondary | ICD-10-CM

## 2013-12-10 DIAGNOSIS — N179 Acute kidney failure, unspecified: Secondary | ICD-10-CM | POA: Diagnosis present

## 2013-12-10 LAB — GLUCOSE, CAPILLARY
GLUCOSE-CAPILLARY: 257 mg/dL — AB (ref 70–99)
Glucose-Capillary: 302 mg/dL — ABNORMAL HIGH (ref 70–99)

## 2013-12-10 LAB — URINALYSIS, ROUTINE W REFLEX MICROSCOPIC
BILIRUBIN URINE: NEGATIVE
Glucose, UA: NEGATIVE mg/dL
KETONES UR: NEGATIVE mg/dL
LEUKOCYTES UA: NEGATIVE
Nitrite: NEGATIVE
PROTEIN: NEGATIVE mg/dL
Specific Gravity, Urine: 1.02 (ref 1.005–1.030)
Urobilinogen, UA: 0.2 mg/dL (ref 0.0–1.0)
pH: 5.5 (ref 5.0–8.0)

## 2013-12-10 LAB — BLOOD GAS, ARTERIAL
ACID-BASE EXCESS: 0.3 mmol/L (ref 0.0–2.0)
Bicarbonate: 25.2 mEq/L — ABNORMAL HIGH (ref 20.0–24.0)
Drawn by: 27407
O2 Content: 4 L/min
O2 SAT: 96.1 %
PO2 ART: 87.2 mmHg (ref 80.0–100.0)
Patient temperature: 37
TCO2: 23.5 mmol/L (ref 0–100)
pCO2 arterial: 46.8 mmHg — ABNORMAL HIGH (ref 35.0–45.0)
pH, Arterial: 7.351 (ref 7.350–7.450)

## 2013-12-10 LAB — COMPREHENSIVE METABOLIC PANEL
ALT: 11 U/L (ref 0–35)
AST: 19 U/L (ref 0–37)
Albumin: 3.8 g/dL (ref 3.5–5.2)
Alkaline Phosphatase: 83 U/L (ref 39–117)
BILIRUBIN TOTAL: 0.4 mg/dL (ref 0.3–1.2)
BUN: 58 mg/dL — AB (ref 6–23)
CALCIUM: 9.5 mg/dL (ref 8.4–10.5)
CO2: 27 meq/L (ref 19–32)
CREATININE: 1.89 mg/dL — AB (ref 0.50–1.10)
Chloride: 103 mEq/L (ref 96–112)
GFR, EST AFRICAN AMERICAN: 26 mL/min — AB (ref >90)
GFR, EST NON AFRICAN AMERICAN: 22 mL/min — AB (ref >90)
GLUCOSE: 148 mg/dL — AB (ref 70–99)
Potassium: 4.4 mEq/L (ref 3.7–5.3)
Sodium: 143 mEq/L (ref 137–147)
Total Protein: 7.9 g/dL (ref 6.0–8.3)

## 2013-12-10 LAB — CBC WITH DIFFERENTIAL/PLATELET
Basophils Absolute: 0 10*3/uL (ref 0.0–0.1)
Basophils Relative: 0 % (ref 0–1)
EOS PCT: 3 % (ref 0–5)
Eosinophils Absolute: 0.4 10*3/uL (ref 0.0–0.7)
HEMATOCRIT: 32.4 % — AB (ref 36.0–46.0)
HEMOGLOBIN: 10.2 g/dL — AB (ref 12.0–15.0)
LYMPHS ABS: 1.6 10*3/uL (ref 0.7–4.0)
LYMPHS PCT: 15 % (ref 12–46)
MCH: 29.1 pg (ref 26.0–34.0)
MCHC: 31.5 g/dL (ref 30.0–36.0)
MCV: 92.6 fL (ref 78.0–100.0)
MONO ABS: 0.5 10*3/uL (ref 0.1–1.0)
MONOS PCT: 5 % (ref 3–12)
Neutro Abs: 7.8 10*3/uL — ABNORMAL HIGH (ref 1.7–7.7)
Neutrophils Relative %: 76 % (ref 43–77)
Platelets: 226 10*3/uL (ref 150–400)
RBC: 3.5 MIL/uL — AB (ref 3.87–5.11)
RDW: 15.5 % (ref 11.5–15.5)
WBC: 10.3 10*3/uL (ref 4.0–10.5)

## 2013-12-10 LAB — HEMOGLOBIN A1C
Hgb A1c MFr Bld: 6.5 % — ABNORMAL HIGH (ref <5.7)
MEAN PLASMA GLUCOSE: 140 mg/dL — AB (ref <117)

## 2013-12-10 LAB — URINE MICROSCOPIC-ADD ON

## 2013-12-10 LAB — PRO B NATRIURETIC PEPTIDE: PRO B NATRI PEPTIDE: 856.7 pg/mL — AB (ref 0–450)

## 2013-12-10 LAB — TROPONIN I: Troponin I: <0.3 ng/mL (ref <0.30)

## 2013-12-10 LAB — CBG MONITORING, ED: Glucose-Capillary: 141 mg/dL — ABNORMAL HIGH (ref 70–99)

## 2013-12-10 MED ORDER — INSULIN ASPART 100 UNIT/ML ~~LOC~~ SOLN
0.0000 [IU] | Freq: Three times a day (TID) | SUBCUTANEOUS | Status: DC
Start: 2013-12-10 — End: 2013-12-14
  Administered 2013-12-10: 15 [IU] via SUBCUTANEOUS
  Administered 2013-12-11 – 2013-12-13 (×7): 4 [IU] via SUBCUTANEOUS
  Administered 2013-12-13: 3 [IU] via SUBCUTANEOUS
  Administered 2013-12-13: 7 [IU] via SUBCUTANEOUS
  Administered 2013-12-14: 15 [IU] via SUBCUTANEOUS

## 2013-12-10 MED ORDER — FUROSEMIDE 10 MG/ML IJ SOLN
80.0000 mg | Freq: Once | INTRAMUSCULAR | Status: AC
  Administered 2013-12-10: 80 mg via INTRAVENOUS
  Filled 2013-12-10: qty 8

## 2013-12-10 MED ORDER — FUROSEMIDE 10 MG/ML IJ SOLN
40.0000 mg | Freq: Two times a day (BID) | INTRAMUSCULAR | Status: DC
  Administered 2013-12-10 – 2013-12-11 (×3): 40 mg via INTRAVENOUS
  Filled 2013-12-10 (×4): qty 4

## 2013-12-10 MED ORDER — METHYLPREDNISOLONE SODIUM SUCC 125 MG IJ SOLR
60.0000 mg | Freq: Four times a day (QID) | INTRAMUSCULAR | Status: DC
  Administered 2013-12-10 – 2013-12-13 (×12): 60 mg via INTRAVENOUS
  Filled 2013-12-10 (×12): qty 2

## 2013-12-10 MED ORDER — IPRATROPIUM-ALBUTEROL 0.5-2.5 (3) MG/3ML IN SOLN
3.0000 mL | Freq: Once | RESPIRATORY_TRACT | Status: AC
  Administered 2013-12-10: 3 mL via RESPIRATORY_TRACT
  Filled 2013-12-10: qty 3

## 2013-12-10 MED ORDER — DIPHENHYDRAMINE HCL 25 MG PO CAPS: 25.0000 mg | ORAL_CAPSULE | Freq: Four times a day (QID) | ORAL | Status: DC | PRN

## 2013-12-10 MED ORDER — METOPROLOL TARTRATE 50 MG PO TABS
50.0000 mg | ORAL_TABLET | Freq: Two times a day (BID) | ORAL | Status: DC
  Administered 2013-12-10 – 2013-12-14 (×9): 50 mg via ORAL
  Filled 2013-12-10 (×9): qty 1

## 2013-12-10 MED ORDER — SODIUM CHLORIDE 0.9 % IV SOLN
250.0000 mL | INTRAVENOUS | Status: DC | PRN
  Administered 2013-12-10: 250 mL via INTRAVENOUS

## 2013-12-10 MED ORDER — SODIUM CHLORIDE 0.9 % IJ SOLN
3.0000 mL | INTRAMUSCULAR | Status: DC | PRN
  Administered 2013-12-11: 3 mL via INTRAVENOUS

## 2013-12-10 MED ORDER — ALLOPURINOL 100 MG PO TABS
200.0000 mg | ORAL_TABLET | Freq: Every day | ORAL | Status: DC
  Administered 2013-12-10 – 2013-12-14 (×5): 200 mg via ORAL
  Filled 2013-12-10 (×5): qty 2

## 2013-12-10 MED ORDER — PAROXETINE HCL 20 MG PO TABS
20.0000 mg | ORAL_TABLET | ORAL | Status: DC
  Administered 2013-12-11 – 2013-12-14 (×4): 20 mg via ORAL
  Filled 2013-12-10 (×4): qty 1

## 2013-12-10 MED ORDER — SODIUM CHLORIDE 0.9 % IJ SOLN
3.0000 mL | Freq: Two times a day (BID) | INTRAMUSCULAR | Status: DC
  Administered 2013-12-10 – 2013-12-13 (×4): 3 mL via INTRAVENOUS

## 2013-12-10 MED ORDER — ONDANSETRON HCL 4 MG/2ML IJ SOLN: 4.0000 mg | Freq: Four times a day (QID) | INTRAMUSCULAR | Status: DC | PRN

## 2013-12-10 MED ORDER — IPRATROPIUM BROMIDE 0.02 % IN SOLN: 0.5000 mg | RESPIRATORY_TRACT | Status: DC

## 2013-12-10 MED ORDER — ACETAMINOPHEN 650 MG RE SUPP: 650.0000 mg | Freq: Four times a day (QID) | RECTAL | Status: DC | PRN

## 2013-12-10 MED ORDER — ASPIRIN EC 81 MG PO TBEC
81.0000 mg | DELAYED_RELEASE_TABLET | Freq: Every day | ORAL | Status: DC
  Administered 2013-12-11 – 2013-12-14 (×4): 81 mg via ORAL
  Filled 2013-12-10 (×4): qty 1

## 2013-12-10 MED ORDER — ENOXAPARIN SODIUM 40 MG/0.4ML ~~LOC~~ SOLN: 40.0000 mg | SUBCUTANEOUS | Status: DC

## 2013-12-10 MED ORDER — METHYLPREDNISOLONE SODIUM SUCC 125 MG IJ SOLR
125.0000 mg | Freq: Once | INTRAMUSCULAR | Status: AC
  Administered 2013-12-10: 125 mg via INTRAVENOUS
  Filled 2013-12-10: qty 2

## 2013-12-10 MED ORDER — SIMVASTATIN 20 MG PO TABS
20.0000 mg | ORAL_TABLET | Freq: Every day | ORAL | Status: DC
  Administered 2013-12-10 – 2013-12-13 (×4): 20 mg via ORAL
  Filled 2013-12-10 (×4): qty 1

## 2013-12-10 MED ORDER — SODIUM CHLORIDE 0.9 % IJ SOLN
3.0000 mL | Freq: Two times a day (BID) | INTRAMUSCULAR | Status: DC
  Administered 2013-12-10 – 2013-12-14 (×7): 3 mL via INTRAVENOUS

## 2013-12-10 MED ORDER — ALBUTEROL SULFATE (2.5 MG/3ML) 0.083% IN NEBU: 2.5000 mg | INHALATION_SOLUTION | Freq: Once | RESPIRATORY_TRACT | Status: DC

## 2013-12-10 MED ORDER — OXYBUTYNIN CHLORIDE ER 5 MG PO TB24
5.0000 mg | ORAL_TABLET | Freq: Every day | ORAL | Status: DC
  Administered 2013-12-10 – 2013-12-13 (×4): 5 mg via ORAL
  Filled 2013-12-10 (×4): qty 1

## 2013-12-10 MED ORDER — PANTOPRAZOLE SODIUM 40 MG PO TBEC
40.0000 mg | DELAYED_RELEASE_TABLET | Freq: Every day | ORAL | Status: DC
  Administered 2013-12-10 – 2013-12-14 (×5): 40 mg via ORAL
  Filled 2013-12-10 (×5): qty 1

## 2013-12-10 MED ORDER — IPRATROPIUM BROMIDE 0.02 % IN SOLN: 0.5000 mg | Freq: Once | RESPIRATORY_TRACT | Status: DC

## 2013-12-10 MED ORDER — INSULIN ASPART 100 UNIT/ML ~~LOC~~ SOLN
0.0000 [IU] | Freq: Every day | SUBCUTANEOUS | Status: DC
  Administered 2013-12-10 – 2013-12-11 (×2): 3 [IU] via SUBCUTANEOUS
  Administered 2013-12-12 – 2013-12-13 (×2): 2 [IU] via SUBCUTANEOUS

## 2013-12-10 MED ORDER — FENOFIBRATE 54 MG PO TABS
54.0000 mg | ORAL_TABLET | Freq: Every day | ORAL | Status: DC
  Administered 2013-12-10 – 2013-12-13 (×4): 54 mg via ORAL
  Filled 2013-12-10 (×8): qty 1

## 2013-12-10 MED ORDER — ALBUTEROL SULFATE (2.5 MG/3ML) 0.083% IN NEBU
2.5000 mg | INHALATION_SOLUTION | Freq: Once | RESPIRATORY_TRACT | Status: AC
  Administered 2013-12-10: 2.5 mg via RESPIRATORY_TRACT
  Filled 2013-12-10: qty 3

## 2013-12-10 MED ORDER — ACETAMINOPHEN 325 MG PO TABS: 650.0000 mg | ORAL_TABLET | Freq: Four times a day (QID) | ORAL | Status: DC | PRN

## 2013-12-10 MED ORDER — IPRATROPIUM-ALBUTEROL 0.5-2.5 (3) MG/3ML IN SOLN
3.0000 mL | RESPIRATORY_TRACT | Status: DC
  Administered 2013-12-10 – 2013-12-14 (×22): 3 mL via RESPIRATORY_TRACT
  Filled 2013-12-10 (×22): qty 3

## 2013-12-10 MED ORDER — ALBUTEROL SULFATE (2.5 MG/3ML) 0.083% IN NEBU: 2.5000 mg | INHALATION_SOLUTION | RESPIRATORY_TRACT | Status: DC | PRN

## 2013-12-10 MED ORDER — FUROSEMIDE 10 MG/ML IJ SOLN
40.0000 mg | Freq: Once | INTRAMUSCULAR | Status: AC
  Administered 2013-12-10: 40 mg via INTRAVENOUS
  Filled 2013-12-10: qty 4

## 2013-12-10 MED ORDER — ENOXAPARIN SODIUM 30 MG/0.3ML ~~LOC~~ SOLN
30.0000 mg | SUBCUTANEOUS | Status: DC
  Administered 2013-12-10 – 2013-12-14 (×5): 30 mg via SUBCUTANEOUS
  Filled 2013-12-10 (×5): qty 0.3

## 2013-12-10 MED ORDER — GUAIFENESIN 100 MG/5ML PO SOLN
200.0000 mg | Freq: Four times a day (QID) | ORAL | Status: DC | PRN
  Administered 2013-12-12 – 2013-12-14 (×2): 200 mg via ORAL
  Filled 2013-12-10 (×2): qty 10

## 2013-12-10 MED ORDER — ALBUTEROL SULFATE (2.5 MG/3ML) 0.083% IN NEBU: 2.5000 mg | INHALATION_SOLUTION | RESPIRATORY_TRACT | Status: DC

## 2013-12-10 MED ORDER — ONDANSETRON HCL 4 MG PO TABS: 4.0000 mg | ORAL_TABLET | Freq: Four times a day (QID) | ORAL | Status: DC | PRN

## 2013-12-10 NOTE — ED Provider Notes (Signed)
CSN: [id number]     Arrival date & time 12/10/13  0900 History  This chart was scribed for Maudry Diego, MD by Delphia Grates, ED Scribe. This patient was seen in room APA14/APA14 and the patient's care was started at 9:14 AM.    Chief Complaint  Patient presents with  . Shortness of Breath     Patient is a 80 y.o. female presenting with shortness of breath. The history is provided by the patient. No language interpreter was used.  Shortness of Breath Severity:  Moderate Duration:  1 day Timing:  Constant Progression:  Worsening Chronicity:  Recurrent Relieved by:  Nothing Worsened by:  Deep breathing and coughing Associated symptoms: wheezing   Associated symptoms: no abdominal pain, no chest pain, no cough, no fever, no headaches and no rash    HPI Comments: [name] is a 80 y.o. female with a history of asthma brought in by ambulance, who presents to the Emergency Department complaining of constant, gradually worsening shortness of breath that began yesterday. She reports associated wheezing. Per EMS, patient's O2 sat was 92% on RA upon their arrival. Patient was given 2 duoneb at the facility and 1 albuterol nebulizer by EMS en route. She denies fever.   Dr. Mickie Hillier Past Medical History  Diagnosis Date  . Breast cancer   . Hypertension   . Depression   . GERD (gastroesophageal reflux disease)   . Hypercholesteremia   . CKD (chronic kidney disease)   . Pneumonia 12/12/2011  . UTI (lower urinary tract infection) 12/12/2011  . Chronic kidney disease   . Shortness of breath   . Arthritis   . Asthma   . Reflux   . Venous stasis   . Diabetes mellitus without complication     Type 2   Past Surgical History  Procedure Laterality Date  . Replacement total knee    . Mastectomy    . Total hip arthroplasty    . Carotid endarterectomy    . Cataracts     Family History  Problem Relation Age of Onset  . Asthma Other   . Diabetes Other    History   Substance Use Topics  . Smoking status: Never Smoker   . Smokeless tobacco: Never Used  . Alcohol Use: No   OB History   Grav Para Term Preterm Abortions TAB SAB Ect Mult Living                 Review of Systems  Constitutional: Negative for fever, appetite change and fatigue.  HENT: Negative for congestion, ear discharge and sinus pressure.   Eyes: Negative for discharge.  Respiratory: Positive for shortness of breath and wheezing. Negative for cough.   Cardiovascular: Negative for chest pain.  Gastrointestinal: Negative for abdominal pain and diarrhea.  Genitourinary: Negative for frequency and hematuria.  Musculoskeletal: Negative for back pain.  Skin: Negative for rash.  Neurological: Negative for seizures and headaches.  Psychiatric/Behavioral: Negative for hallucinations.      Allergies  Amoxicillin and Penicillins  Home Medications   Current Outpatient Rx  Name  Route  Sig  Dispense  Refill  . allopurinol (ZYLOPRIM) 100 MG tablet      TAKE (2) TABLETS BY MOUTH ONCE DAILY.   60 tablet   5   . aspirin 81 MG tablet   Oral   Take 81 mg by mouth daily.         . ASPIRIN LOW DOSE 81 MG EC tablet  TAKE 1 TABLET BY MOUTH ONCE DAILY.   30 tablet   0   . furosemide (LASIX) 40 MG tablet      TAKE 1 & 1/2 TABLET (60mg ) BY MOUTH ONCE DAILY.   45 tablet   5   . ketoconazole (NIZORAL) 2 % cream   Topical   Apply topically 2 (two) times daily.   30 g   4   . metoprolol (LOPRESSOR) 50 MG tablet      TAKE 1 TABLET BY MOUTH TWICE DAILY.   60 tablet   5   . Multiple Vitamins-Minerals (ICAPS AREDS FORMULA PO)   Oral   Take 1 tablet by mouth 2 (two) times daily.         . naproxen sodium (ALEVE) 220 MG tablet   Oral   Take 220 mg by mouth daily.         Marland Kitchen nystatin cream (MYCOSTATIN)               . oxybutynin (DITROPAN-XL) 5 MG 24 hr tablet   Oral   Take 1 tablet (5 mg total) by mouth at bedtime.   30 tablet   0   . pantoprazole  (PROTONIX) 40 MG tablet      TAKE ONE TABLET BY MOUTH ONCE DAILY.   30 tablet   5   . PARoxetine (PAXIL) 20 MG tablet   Oral   Take 20 mg by mouth every morning.         . potassium chloride SA (K-DUR,KLOR-CON) 20 MEQ tablet      TAKE 1 TABLET BY MOUTH TWICE DAILY.   60 tablet   3   . PROAIR HFA 108 (90 BASE) MCG/ACT inhaler      INHALE 2 PUFFS INTO LUNGS EVERY 6 HOURS AS NEEDED FOR WHEEZING.   8.5 g   0   . QC NAPROXEN SODIUM 220 MG tablet      TAKE TABLET BY MOUTH EACH MORNING.   30 tablet   0   . simvastatin (ZOCOR) 20 MG tablet      TAKE 1 TABLET BY MOUTH AT BEDTIME.   30 tablet   5   . Spacer/Aero-Holding Chambers (AEROCHAMBER MV) inhaler      Use as instructed. 2 puffs BID   1 each   0   . TEKTURNA 150 MG tablet      TAKE 1 TABLET BY MOUTH EACH MORNING.   30 tablet   5    BP 185/57  Pulse 77  Temp(Src) 97.6 F (36.4 C) (Oral)  Resp 20  Ht 5\' 5"  (1.651 m)  Wt 200 lb (90.719 kg)  BMI 33.28 kg/m2  SpO2 96% Physical Exam  Nursing note and vitals reviewed. Constitutional: She is oriented to person, place, and time. She appears well-developed.  HENT:  Head: Normocephalic.  Eyes: Conjunctivae and EOM are normal. No scleral icterus.  Neck: Neck supple. No thyromegaly present.  Cardiovascular: Normal rate, regular rhythm and normal heart sounds.  Exam reveals no gallop and no friction rub.   No murmur heard. Pulmonary/Chest: Effort normal. No stridor. She has wheezes (Moderate upper airway wheezing). She has no rales. She exhibits no tenderness.  Abdominal: She exhibits no distension. There is no tenderness. There is no rebound.  Musculoskeletal: Normal range of motion. She exhibits edema (2+ edema in bilateral ankles).  Lymphadenopathy:    She has no cervical adenopathy.  Neurological: She is oriented to person, place, and time. She exhibits normal muscle  tone. Coordination normal.  Skin: No rash noted. No erythema.  Psychiatric: She has a  normal mood and affect. Her behavior is normal.    ED Course  Procedures (including critical care time) DIAGNOSTIC STUDIES: Oxygen Saturation is 96% on [place] 2L, adequate by my interpretation.    COORDINATION OF CARE: 9:25 AM- Will order CXR and lab work. Pt advised of plan for treatment and pt agrees.  12:01 PM- Upon recheck, pt condition is not improving. Pt advised of plan for treatment and pt agrees.   Medications  furosemide (LASIX) injection 40 mg (not administered)     Labs Review Labs Reviewed  CBC WITH DIFFERENTIAL - Abnormal; Notable for the following:    RBC 3.50 (*)    Hemoglobin 10.2 (*)    HCT 32.4 (*)    Neutro Abs 7.8 (*)    All other components within normal limits  COMPREHENSIVE METABOLIC PANEL - Abnormal; Notable for the following:    Glucose, Bld 148 (*)    BUN 58 (*)    Creatinine, Ser 1.89 (*)    GFR calc non Af Amer 22 (*)    GFR calc Af Amer 26 (*)    All other components within normal limits  PRO B NATRIURETIC PEPTIDE - Abnormal; Notable for the following:    Pro B Natriuretic peptide (BNP) 856.7 (*)    All other components within normal limits  URINALYSIS, ROUTINE W REFLEX MICROSCOPIC - Abnormal; Notable for the following:    Hgb urine dipstick TRACE (*)    All other components within normal limits  URINE MICROSCOPIC-ADD ON - Abnormal; Notable for the following:    Bacteria, UA FEW (*)    All other components within normal limits  CBG MONITORING, ED - Abnormal; Notable for the following:    Glucose-Capillary 141 (*)    All other components within normal limits  TROPONIN I   Imaging Review Dg Chest Portable 1 View  12/10/2013   CLINICAL DATA:  Short of breath  EXAM: PORTABLE CHEST - 1 VIEW  COMPARISON:  12/11/2011  FINDINGS: Mild atelectasis in the lung bases. Mild vascular congestion without edema or effusion. Heart size upper normal.  IMPRESSION: Mild vascular congestion and mild bibasilar atelectasis.   Electronically Signed   By: Franchot Gallo M.D.   On: 12/10/2013 09:41     EKG Interpretation   Date/Time:  Friday December 10 2013 09:06:01 EDT Ventricular Rate:  82 PR Interval:  192 QRS Duration: 92 QT Interval:  396 QTC Calculation: 462 R Axis:   71 Text Interpretation:  Normal sinus rhythm Normal ECG When compared with  ECG of 05-Sep-2010 18:03, No significant change was found Confirmed by  Cristoval Teall  MD, Chan Rosasco [phone]) on 12/10/2013 12:52:55 PM      MDM   Final diagnoses:  None    The chart was scribed for me under my direct supervision.  I personally performed the history, physical, and medical decision making and all procedures in the evaluation of this patient.Maudry Diego, MD 12/10/13 [phone]

## 2013-12-10 NOTE — H&P (Signed)
Patient seen and examined. The above note reviewed.  She's been admitted with worsening shortness of breath. She does have an element of mild CHF, although it appears that her shortness of breath may be more related to bronchoconstriction. She is started on bronchodilators as well as intravenous steroids. The patient is also receiving IV Lasix. She will likely need proper way function tests once his acute issue has resolved. Monitor creatinine while on IV Lasix.  [employer]

## 2013-12-10 NOTE — ED Notes (Signed)
Resp paged for breathing treatment.  

## 2013-12-10 NOTE — ED Notes (Addendum)
No improvement noted post breathing treatment. Pt still labored with accessory muscle use. EDP aware and at bedside.Pt 02 sats on 1liter [place] 91. Pt oxygen increased to 2liters via [place].

## 2013-12-10 NOTE — H&P (Signed)
[facility] History and Physical  [name] EVO:[id number] DOB: [birth year] DOA: 12/10/2013  Referring physician:  PCP: No primary provider on file.   Chief Complaint: sob worsening LE edema  HPI: [name] is a 80 y.o. female with a past medical history that includes asthma, hypertension, chronic kidney disease stage III, diabetes presents to the emergency department from a nursing facility with chief complaint of worsening shortness of breath. Information is obtained from the daughter who is at the bedside. Daughter reports that she noticed some mild increased work of breathing with exertion and wheezing when the patient was visiting for the Easter holiday. Since that time the shortness of breath and wheezing has worsened. Associated symptoms include productive cough with thick white sputum. This morning the staff of the facility noted obvious increased work of breathing about patient and called EMS. EMS reports oxygen saturation level at that time was 92% on room air. She was given to do a nebs at the facility and one albuterol nebulizer by EMS en route. There is no report of recent fever chills nausea vomiting diarrhea. No complaints of chest pain palpitations headache dizziness syncope or near-syncope. Initial evaluation included a chest x-ray revealing mild vascular congestion with mild by basilar atelectasis. ProBNP elevated to 849. Basic metabolic panel significant for a creatinine of 1.89 serum glucose of 148. Complete blood count significant for hemoglobin of 10.2. Oxygen saturation levels 94% on 2 L of oxygen. She is hemodynamically stable with a mildly elevated blood pressure. She is afebrile In the emergency department she was given Lasix 40 mg intravenously as well as nebulizers. She is also provided with 125 mg of Solu-Medrol  Review of Systems:  10 point review of systems complete and all systems are negative except as indicated in the history of present  illness. Past Medical History  Diagnosis Date  . Breast cancer   . Hypertension   . Depression   . GERD (gastroesophageal reflux disease)   . Hypercholesteremia   . CKD (chronic kidney disease)   . Pneumonia 12/12/2011  . UTI (lower urinary tract infection) 12/12/2011  . Chronic kidney disease   . Shortness of breath   . Arthritis   . Asthma   . Reflux   . Venous stasis   . Diabetes mellitus without complication     Type 2   Past Surgical History  Procedure Laterality Date  . Replacement total knee    . Mastectomy    . Total hip arthroplasty    . Carotid endarterectomy    . Cataracts     Social History:  reports that she has never smoked. She has never used smokeless tobacco. She reports that she does not drink alcohol or use illicit drugs. Patient is a resident of nursing facility and has been 1 since January of this year. She is mostly wheelchair-bound but she does propel herself around the facility. Allergies  Allergen Reactions  . Carbapenems Other (See Comments)    unknown  . Cephalosporins Other (See Comments)    unknown  . Amoxicillin Rash  . Penicillins Rash    Family History  Problem Relation Age of Onset  . Asthma Other   . Diabetes Other     Family medical history reviewed and is noncontributory to the admission of this elderly lady Prior to Admission medications   Medication Sig Start Date End Date Taking? Authorizing Provider  acetaminophen-codeine (TYLENOL #3) 300-30 MG per tablet Take 1 tablet by mouth every 6 (six) hours  as needed for moderate pain.   Yes Historical Provider, MD  albuterol (PROVENTIL HFA;VENTOLIN HFA) 108 (90 BASE) MCG/ACT inhaler Inhale 2 puffs into the lungs every 6 (six) hours as needed for wheezing or shortness of breath.   Yes Historical Provider, MD  allopurinol (ZYLOPRIM) 100 MG tablet TAKE (2) TABLETS BY MOUTH ONCE DAILY. 08/07/13  Yes Mikey Kirschner, MD  ASPIRIN LOW DOSE 81 MG EC tablet TAKE 1 TABLET BY MOUTH ONCE DAILY.  10/25/13  Yes Mikey Kirschner, MD  Cranberry 475 MG CAPS Take 1 capsule by mouth every 12 (twelve) hours.   Yes Historical Provider, MD  diphenhydrAMINE (BENADRYL) 25 MG tablet Take 25 mg by mouth every 6 (six) hours as needed for itching.   Yes Historical Provider, MD  fenofibrate (TRICOR) 48 MG tablet Take 48 mg by mouth at bedtime.   Yes Historical Provider, MD  furosemide (LASIX) 40 MG tablet TAKE 1 & 1/2 TABLET (60mg ) BY MOUTH ONCE DAILY. 11/08/13  Yes Mikey Kirschner, MD  guaifenesin (TUSSIN) 100 MG/5ML syrup Take 200 mg by mouth every 6 (six) hours as needed for cough.   Yes Historical Provider, MD  metoprolol (LOPRESSOR) 50 MG tablet TAKE 1 TABLET BY MOUTH TWICE DAILY. 11/08/13  Yes Mikey Kirschner, MD  naproxen sodium (ALEVE) 220 MG tablet Take 220 mg by mouth daily.   Yes Historical Provider, MD  oxybutynin (DITROPAN-XL) 5 MG 24 hr tablet Take 1 tablet (5 mg total) by mouth at bedtime. 09/06/13  Yes Mikey Kirschner, MD  pantoprazole (PROTONIX) 40 MG tablet TAKE ONE TABLET BY MOUTH ONCE DAILY. 06/28/13  Yes Mikey Kirschner, MD  PARoxetine (PAXIL) 20 MG tablet Take 20 mg by mouth every morning.   Yes Historical Provider, MD  penicillin v potassium (VEETID) 250 MG tablet Take 250 mg by mouth daily.   Yes Historical Provider, MD  Polyethyl Glycol-Propyl Glycol (SYSTANE OP) Place 1 drop into both eyes 2 (two) times daily.   Yes Historical Provider, MD  PROAIR HFA 108 (90 BASE) MCG/ACT inhaler INHALE 2 PUFFS INTO LUNGS EVERY 6 HOURS AS NEEDED FOR WHEEZING. 10/27/13  Yes Mikey Kirschner, MD  QC NAPROXEN SODIUM 220 MG tablet TAKE TABLET BY MOUTH EACH MORNING. 10/25/13  Yes Mikey Kirschner, MD  simvastatin (ZOCOR) 20 MG tablet TAKE 1 TABLET BY MOUTH AT BEDTIME. 11/08/13  Yes Mikey Kirschner, MD  Spacer/Aero-Holding Chambers (AEROCHAMBER MV) inhaler Use as instructed. 2 puffs BID 09/28/13  Yes Mikey Kirschner, MD  TEKTURNA 150 MG tablet TAKE 1 TABLET BY MOUTH EACH MORNING. 11/08/13  Yes Mikey Kirschner,  MD   Physical Exam: Filed Vitals:   12/10/13 1240  BP: 195/70  Pulse: 85  Temp:   Resp: 18    BP 195/70  Pulse 85  Temp(Src) 97.6 F (36.4 C) (Oral)  Resp 18  Ht 5\' 5"  (1.651 m)  Wt 90.719 kg (200 lb)  BMI 33.28 kg/m2  SpO2 95%  General:  Obese appears somewhat uncomfortable Eyes: PERRL, normal lids, irises & conjunctiva ENT: grossly normal hearing, lips & tongue Neck: no LAD, masses or thyromegaly Cardiovascular: RRR, no m/r/g. 1-2+ lower extremity edema particularly in ankle. Respiratory: Moderate increased work of breathing. She uses abdominal accessory muscles. Prolonged expiratory phase with audible wheezes. Rest sounds with poor air flow and diffuse rhonchi and wheezing. Abdomen: soft, ntnd obese soft positive bowel sounds throughout nontender to palpation Skin: no rash or induration seen on limited exam Musculoskeletal: grossly normal  tone BUE/BLE Psychiatric: grossly normal mood and affect, speech fluent and appropriate Neurologic: grossly non-focal. Speech clear facial symmetry           Labs on Admission:  Basic Metabolic Panel:  Recent Labs Lab 12/10/13 0928  NA 143  K 4.4  CL 103  CO2 27  GLUCOSE 148*  BUN 58*  CREATININE 1.89*  CALCIUM 9.5   Liver Function Tests:  Recent Labs Lab 12/10/13 0928  AST 19  ALT 11  ALKPHOS 83  BILITOT 0.4  PROT 7.9  ALBUMIN 3.8   No results found for this basename: LIPASE, AMYLASE,  in the last 168 hours No results found for this basename: AMMONIA,  in the last 168 hours CBC:  Recent Labs Lab 12/10/13 0928  WBC 10.3  NEUTROABS 7.8*  HGB 10.2*  HCT 32.4*  MCV 92.6  PLT 226   Cardiac Enzymes:  Recent Labs Lab 12/10/13 0928  TROPONINI <0.30    BNP (last 3 results)  Recent Labs  12/10/13 0928  PROBNP 856.7*   CBG:  Recent Labs Lab 12/10/13 0916  GLUCAP 141*    Radiological Exams on Admission: Dg Chest Portable 1 View  12/10/2013   CLINICAL DATA:  Short of breath  EXAM: PORTABLE  CHEST - 1 VIEW  COMPARISON:  12/11/2011  FINDINGS: Mild atelectasis in the lung bases. Mild vascular congestion without edema or effusion. Heart size upper normal.  IMPRESSION: Mild vascular congestion and mild bibasilar atelectasis.   Electronically Signed   By: Franchot Gallo M.D.   On: 12/10/2013 09:41    EKG: Independently reviewed all sinus or  Assessment/Plan Principal Problem:   Acute respiratory failure: Likely multifactorial specifically COPD exacerbation in the setting of mild acute CHF. We will admit to telemetry. We will continue oxygen supplementation. We will also continue nebulizers and Solu-Medrol as well as IV Lasix. Will request ABG. Will obtain daily weights and monitor strict intake and output. Will also request a 2-D echo as most recent was in 2012 yielding an ejection fraction of 55-60%. Active Problems:  CHF (congestive heart failure); likely diastolic. patient takes 60 mg of Lasix at home. She does have mild worsening lower extremity edema. Will continue Lasix 40 mg twice a day. Will obtain daily weights and monitor strict intake and output. Have also requested 2-D echo to evaluate LV function. Last echo done in 2012 yielded EF of 55-60%. Will continue her home beta blocker.    COPD with exacerbation; patient's medical history list asthma. She is on an inhaler. She is not on home oxygen. See #1    Acute on chronic renal failure: Stage III. Likely related to acute illness. Will hold any nephrotoxins. She is on Aleve at home we will hold this. Monitor urine output. Will recheck in the morning    Dyslipidemia: Continue her statin.    Type II or unspecified type diabetes mellitus without mention of complication, not stated as uncontrolled: Will obtain a hemoglobin A1c. Not on insulin at home. Will monitor CBGs and use sliding scale insulin.    Dysphagia, pharyngoesophageal phase: Has been evaluated in the past and diagnosed with mild aspiration. Will use dysphagia 3  diet.  Hypertension: Home medications include Lasix, Lopressor, Tekturna. Will continue Lopressor. Getting IV Lasix per #2. Hold Tekturna. Blood pressure in the emergency room slightly elevated at 178/88.    Code Status: DNR Family Communication: daughter at bedside Disposition Plan: back to facility  Time spent: 27 minutes  [place]  Pager [phone]

## 2013-12-10 NOTE — ED Notes (Signed)
Patient brought in via EMS from [place]. Alert and oriented. Airway patent. Patient c/o shortness of breath that started yesterday. Patient has labored breathing with audible wheezing. Patient has hx of asthma. Denies any chest pain. Per EMS personal patient's O2 sat 92% on room air upon their arrival. According to EMS personal patient received 2 duonebs at facility one last night and one this morning along with albuterol inhaler. Patient given x1 albuterol neb by EMS in route to hospital.

## 2013-12-11 DIAGNOSIS — I059 Rheumatic mitral valve disease, unspecified: Secondary | ICD-10-CM

## 2013-12-11 LAB — BASIC METABOLIC PANEL
BUN: 66 mg/dL — ABNORMAL HIGH (ref 6–23)
CHLORIDE: 100 meq/L (ref 96–112)
CO2: 25 meq/L (ref 19–32)
CREATININE: 1.87 mg/dL — AB (ref 0.50–1.10)
Calcium: 9.5 mg/dL (ref 8.4–10.5)
GFR calc Af Amer: 26 mL/min — ABNORMAL LOW (ref >90)
GFR calc non Af Amer: 23 mL/min — ABNORMAL LOW (ref >90)
GLUCOSE: 189 mg/dL — AB (ref 70–99)
Potassium: 4.2 mEq/L (ref 3.7–5.3)
Sodium: 141 mEq/L (ref 137–147)

## 2013-12-11 LAB — GLUCOSE, CAPILLARY
GLUCOSE-CAPILLARY: 178 mg/dL — AB (ref 70–99)
GLUCOSE-CAPILLARY: 160 mg/dL — AB (ref 70–99)
Glucose-Capillary: 195 mg/dL — ABNORMAL HIGH (ref 70–99)
Glucose-Capillary: 266 mg/dL — ABNORMAL HIGH (ref 70–99)

## 2013-12-11 LAB — CBC
HEMATOCRIT: 31.7 % — AB (ref 36.0–46.0)
Hemoglobin: 10.1 g/dL — ABNORMAL LOW (ref 12.0–15.0)
MCH: 29.3 pg (ref 26.0–34.0)
MCHC: 31.9 g/dL (ref 30.0–36.0)
MCV: 91.9 fL (ref 78.0–100.0)
Platelets: 236 10*3/uL (ref 150–400)
RBC: 3.45 MIL/uL — AB (ref 3.87–5.11)
RDW: 15.6 % — AB (ref 11.5–15.5)
WBC: 13.1 10*3/uL — AB (ref 4.0–10.5)

## 2013-12-11 MED ORDER — LEVOFLOXACIN 500 MG PO TABS
500.0000 mg | ORAL_TABLET | ORAL | Status: DC
  Administered 2013-12-13: 500 mg via ORAL
  Filled 2013-12-11 (×2): qty 1

## 2013-12-11 MED ORDER — CARBAMIDE PEROXIDE 6.5 % OT SOLN
5.0000 [drp] | Freq: Two times a day (BID) | OTIC | Status: DC
  Administered 2013-12-11 – 2013-12-14 (×6): 5 [drp] via OTIC
  Filled 2013-12-11 (×2): qty 15

## 2013-12-11 NOTE — Progress Notes (Signed)
[facility] PROGRESS NOTE  [name] JKK:[id number] DOB: 07/09/1922 DOA: 12/10/2013 PCP: No primary provider on file.  Assessment/Plan: 1. Acute respiratory failure. Likely multifactorial due to CHF and COPD. Patient is improving. Continue to wean oxygen. 2. COPD exacerbation. Continue bronchodilators, intravenous steroids. She will receive levofloxacin for antibiotic coverage. Once her acute exacerbation has passed, she would likely benefit from outpatient pulmonary function tests. 3. Acute on chronic diastolic congestive heart failure. Improved with IV Lasix. Echocardiogram does indicate mitral stenosis as well as elevated pulmonary artery pressures. This may be related to an underlying pulmonary illness such as #1. Continue current treatments. She'll likely benefit from an outpatient cardiology followup. 4. Hypertension. Currently stable. Continue current treatments. 5. Acute on chronic renal failure, stage III. Patient is on IV Lasix and hemoglobin has remained stable. Continue to follow renal function. 6. Diabetes. Continue sliding scale insulin.   Code Status: DNR Family Communication: discussed with daughter at the bedside Disposition Plan: PT evaluation   Consultants:    Procedures: Echo:- Left ventricle: The cavity size was normal. There was mild concentric hypertrophy. Systolic function was vigorous. The estimated ejection fraction was in the range of 65% to 70%. Wall motion was normal; there were no regional wall motion abnormalities. Doppler parameters are consistent with abnormal left ventricular relaxation (grade 1 diastolic dysfunction). Doppler parameters are consistent with elevated ventricular end-diastolic filling pressure. - Aortic valve: Trileaflet; moderately thickened, moderately calcified leaflets. Valve mobility was restricted. Transvalvular velocity was within the normal range. There was no stenosis. Mild regurgitation. - Aortic root: The  aortic root was normal in size. - Mitral valve: Severely thickened and calcified mitral valve leaflets predominantly posterior. Calcified annulus. Mean and peak transvalvular velocity was 7 and 16 mmHg, consecutively, consistent with moderate mitral stenosis. Mild regurgitation. - Left atrium: The atrium was moderately dilated. - Right ventricle: The cavity size was mildly dilated. Wall thickness was normal. Systolic function was normal. - Pulmonary arteries: Systolic pressure was severely increased. PA peak pressure: 74mm Hg (S). Impressions:  - Moderate mitral stenosis woth severely elevated RVSP 64 mmHg.     Antibiotics:  levaquin 4/11  HPI/Subjective: Feeling better, breathing better  Objective: Filed Vitals:   12/11/13 1445  BP: 120/45  Pulse: 91  Temp: 98.3 F (36.8 C)  Resp: 20    Intake/Output Summary (Last 24 hours) at 12/11/13 1727 Last data filed at 12/11/13 1002  Gross per 24 hour  Intake    243 ml  Output   1750 ml  Net  -1507 ml   Filed Weights   12/10/13 0916 12/10/13 1443 12/11/13 0449  Weight: 90.719 kg (200 lb) 95 kg (209 lb 7 oz) 98.2 kg (216 lb 7.9 oz)    Exam:   General:  NAD  Cardiovascular: s1, s2, rrr  Respiratory: mild exp wheezing, improving  Abdomen: soft, nt, nd, bs+  Musculoskeletal: no edema b/l   Data Reviewed: Basic Metabolic Panel:  Recent Labs Lab 12/10/13 0928 12/11/13 0620  NA 143 141  K 4.4 4.2  CL 103 100  CO2 27 25  GLUCOSE 148* 189*  BUN 58* 66*  CREATININE 1.89* 1.87*  CALCIUM 9.5 9.5   Liver Function Tests:  Recent Labs Lab 12/10/13 0928  AST 19  ALT 11  ALKPHOS 83  BILITOT 0.4  PROT 7.9  ALBUMIN 3.8   No results found for this basename: LIPASE, AMYLASE,  in the last 168 hours No results found for this basename: AMMONIA,  in the last 168  hours CBC:  Recent Labs Lab 12/10/13 0928 12/11/13 0620  WBC 10.3 13.1*  NEUTROABS 7.8*  --   HGB 10.2* 10.1*  HCT 32.4* 31.7*  MCV 92.6  91.9  PLT 226 236   Cardiac Enzymes:  Recent Labs Lab 12/10/13 0928  TROPONINI <0.30   BNP (last 3 results)  Recent Labs  12/10/13 0928  PROBNP 856.7*   CBG:  Recent Labs Lab 12/10/13 1721 12/10/13 2113 12/11/13 0731 12/11/13 1129 12/11/13 1702  GLUCAP 302* 257* 178* 195* 160*    No results found for this or any previous visit (from the past 240 hour(s)).   Studies: Dg Chest Portable 1 View  12/10/2013   CLINICAL DATA:  Short of breath  EXAM: PORTABLE CHEST - 1 VIEW  COMPARISON:  12/11/2011  FINDINGS: Mild atelectasis in the lung bases. Mild vascular congestion without edema or effusion. Heart size upper normal.  IMPRESSION: Mild vascular congestion and mild bibasilar atelectasis.   Electronically Signed   By: Franchot Gallo M.D.   On: 12/10/2013 09:41    Scheduled Meds: . allopurinol  200 mg Oral Daily  . aspirin EC  81 mg Oral Daily  . enoxaparin (LOVENOX) injection  30 mg Subcutaneous Q24H  . fenofibrate  54 mg Oral Daily  . furosemide  40 mg Intravenous BID  . insulin aspart  0-20 Units Subcutaneous TID WC  . insulin aspart  0-5 Units Subcutaneous QHS  . ipratropium-albuterol  3 mL Nebulization Q4H  . methylPREDNISolone (SOLU-MEDROL) injection  60 mg Intravenous Q6H  . metoprolol tartrate  50 mg Oral BID  . oxybutynin  5 mg Oral QHS  . pantoprazole  40 mg Oral Daily  . PARoxetine  20 mg Oral BH-q7a  . simvastatin  20 mg Oral q1800  . sodium chloride  3 mL Intravenous Q12H  . sodium chloride  3 mL Intravenous Q12H   Continuous Infusions:   Principal Problem:   Acute respiratory failure Active Problems:   Hypertension   Dyslipidemia   Type II or unspecified type diabetes mellitus without mention of complication, not stated as uncontrolled   Dysphagia, pharyngoesophageal phase   CHF (congestive heart failure)   COPD with exacerbation   Acute on chronic renal failure    Time spent: 35mins    [name]  [facility] Pager [phone].  If 7PM-7AM, please contact night-coverage at [email or web address], password [facility] 12/11/2013, 5:27 PM  LOS: 1 day

## 2013-12-11 NOTE — Progress Notes (Signed)
  Echocardiogram 2D Echocardiogram has been performed.  Bonnee Quin 12/11/2013, 11:34 AM

## 2013-12-12 DIAGNOSIS — I5033 Acute on chronic diastolic (congestive) heart failure: Principal | ICD-10-CM

## 2013-12-12 LAB — BASIC METABOLIC PANEL
BUN: 83 mg/dL — ABNORMAL HIGH (ref 6–23)
CALCIUM: 9.2 mg/dL (ref 8.4–10.5)
CO2: 25 mEq/L (ref 19–32)
CREATININE: 1.97 mg/dL — AB (ref 0.50–1.10)
Chloride: 96 mEq/L (ref 96–112)
GFR calc Af Amer: 25 mL/min — ABNORMAL LOW (ref >90)
GFR calc non Af Amer: 21 mL/min — ABNORMAL LOW (ref >90)
Glucose, Bld: 175 mg/dL — ABNORMAL HIGH (ref 70–99)
Potassium: 4.6 mEq/L (ref 3.7–5.3)
Sodium: 137 mEq/L (ref 137–147)

## 2013-12-12 LAB — GLUCOSE, CAPILLARY
Glucose-Capillary: 153 mg/dL — ABNORMAL HIGH (ref 70–99)
Glucose-Capillary: 172 mg/dL — ABNORMAL HIGH (ref 70–99)
Glucose-Capillary: 181 mg/dL — ABNORMAL HIGH (ref 70–99)
Glucose-Capillary: 231 mg/dL — ABNORMAL HIGH (ref 70–99)

## 2013-12-12 LAB — CBC
HCT: 32.6 % — ABNORMAL LOW (ref 36.0–46.0)
Hemoglobin: 10.2 g/dL — ABNORMAL LOW (ref 12.0–15.0)
MCH: 29.1 pg (ref 26.0–34.0)
MCHC: 31.3 g/dL (ref 30.0–36.0)
MCV: 93.1 fL (ref 78.0–100.0)
PLATELETS: 273 10*3/uL (ref 150–400)
RBC: 3.5 MIL/uL — ABNORMAL LOW (ref 3.87–5.11)
RDW: 15.7 % — AB (ref 11.5–15.5)
WBC: 13.8 10*3/uL — AB (ref 4.0–10.5)

## 2013-12-12 MED ORDER — HYDROCODONE-HOMATROPINE 5-1.5 MG/5ML PO SYRP
5.0000 mL | ORAL_SOLUTION | Freq: Four times a day (QID) | ORAL | Status: DC | PRN
  Administered 2013-12-12 – 2013-12-14 (×3): 5 mL via ORAL
  Filled 2013-12-12 (×3): qty 5

## 2013-12-12 MED ORDER — GUAIFENESIN ER 600 MG PO TB12
1200.0000 mg | ORAL_TABLET | Freq: Two times a day (BID) | ORAL | Status: DC
  Administered 2013-12-12 – 2013-12-14 (×4): 1200 mg via ORAL
  Filled 2013-12-12 (×5): qty 2

## 2013-12-12 NOTE — Progress Notes (Signed)
Utilization review Completed Norwin Aleman RN BSN   

## 2013-12-12 NOTE — Progress Notes (Signed)
[facility] PROGRESS NOTE  [name] JSE:[id number] DOB: August 24, 1922 DOA: 12/10/2013 PCP: No primary provider on file.  Assessment/Plan: 1. Acute respiratory failure. Likely multifactorial due to CHF and COPD. Patient is improving. Continue to wean oxygen. 2. COPD exacerbation. Continue bronchodilators, intravenous steroids. She is receiving levofloxacin for antibiotic coverage. Once her acute exacerbation has passed, she would likely benefit from outpatient pulmonary function tests. 3. Acute on chronic diastolic congestive heart failure. Improved with IV Lasix. Echocardiogram does indicate mitral stenosis as well as elevated pulmonary artery pressures. This may be related to an underlying pulmonary illness such as #1. Continue current treatments. She'll likely benefit from an outpatient cardiology followup. 4. Hypertension. Currently stable. Continue current treatments. 5. Acute on chronic renal failure, stage III. Creatinine is slightly higher today. Will discontinue IV lasix.  Urine output is adequate.  Restart oral lasix tomorrow. 6. Diabetes. Continue sliding scale insulin. 7. Aspiration. Patient is likely repeatedly aspirating with by mouth intake. We'll have speech therapy evaluation. This likely causing her cough.   Code Status: DNR Family Communication: discussed with daughter at the bedside Disposition Plan: PT evaluation   Consultants:    Procedures: Echo:- Left ventricle: The cavity size was normal. There was mild concentric hypertrophy. Systolic function was vigorous. The estimated ejection fraction was in the range of 65% to 70%. Wall motion was normal; there were no regional wall motion abnormalities. Doppler parameters are consistent with abnormal left ventricular relaxation (grade 1 diastolic dysfunction). Doppler parameters are consistent with elevated ventricular end-diastolic filling pressure. - Aortic valve: Trileaflet; moderately thickened,  moderately calcified leaflets. Valve mobility was restricted. Transvalvular velocity was within the normal range. There was no stenosis. Mild regurgitation. - Aortic root: The aortic root was normal in size. - Mitral valve: Severely thickened and calcified mitral valve leaflets predominantly posterior. Calcified annulus. Mean and peak transvalvular velocity was 7 and 16 mmHg, consecutively, consistent with moderate mitral stenosis. Mild regurgitation. - Left atrium: The atrium was moderately dilated. - Right ventricle: The cavity size was mildly dilated. Wall thickness was normal. Systolic function was normal. - Pulmonary arteries: Systolic pressure was severely increased. PA peak pressure: 63mm Hg (S). Impressions:  - Moderate mitral stenosis woth severely elevated RVSP 64 mmHg.     Antibiotics:  levaquin 4/11  HPI/Subjective: Had some coughing and wheezing episode overnight.  Doing better tnow.  Objective: Filed Vitals:   12/12/13 1300  BP: 120/62  Pulse: 82  Temp: 98.1 F (36.7 C)  Resp: 20    Intake/Output Summary (Last 24 hours) at 12/12/13 2008 Last data filed at 12/12/13 1300  Gross per 24 hour  Intake      3 ml  Output    975 ml  Net   -972 ml   Filed Weights   12/10/13 1443 12/11/13 0449 12/12/13 0400  Weight: 95 kg (209 lb 7 oz) 98.2 kg (216 lb 7.9 oz) 99.5 kg (219 lb 5.7 oz)    Exam:   General:  NAD  Cardiovascular: s1, s2, rrr  Respiratory: mild exp wheezing, improving  Abdomen: soft, nt, nd, bs+  Musculoskeletal: no edema b/l   Data Reviewed: Basic Metabolic Panel:  Recent Labs Lab 12/10/13 0928 12/11/13 0620 12/12/13 0622  NA 143 141 137  K 4.4 4.2 4.6  CL 103 100 96  CO2 27 25 25   GLUCOSE 148* 189* 175*  BUN 58* 66* 83*  CREATININE 1.89* 1.87* 1.97*  CALCIUM 9.5 9.5 9.2   Liver Function Tests:  Recent Labs Lab  12/10/13 0928  AST 19  ALT 11  ALKPHOS 83  BILITOT 0.4  PROT 7.9  ALBUMIN 3.8   No results found for  this basename: LIPASE, AMYLASE,  in the last 168 hours No results found for this basename: AMMONIA,  in the last 168 hours CBC:  Recent Labs Lab 12/10/13 0928 12/11/13 0620 12/12/13 0622  WBC 10.3 13.1* 13.8*  NEUTROABS 7.8*  --   --   HGB 10.2* 10.1* 10.2*  HCT 32.4* 31.7* 32.6*  MCV 92.6 91.9 93.1  PLT 226 236 273   Cardiac Enzymes:  Recent Labs Lab 12/10/13 0928  TROPONINI <0.30   BNP (last 3 results)  Recent Labs  12/10/13 0928  PROBNP 856.7*   CBG:  Recent Labs Lab 12/11/13 1702 12/11/13 2152 12/12/13 0823 12/12/13 1115 12/12/13 1625  GLUCAP 160* 266* 153* 172* 181*    No results found for this or any previous visit (from the past 240 hour(s)).   Studies: No results found.  Scheduled Meds: . allopurinol  200 mg Oral Daily  . aspirin EC  81 mg Oral Daily  . carbamide peroxide  5 drop Both Ears BID  . enoxaparin (LOVENOX) injection  30 mg Subcutaneous Q24H  . fenofibrate  54 mg Oral Daily  . insulin aspart  0-20 Units Subcutaneous TID WC  . insulin aspart  0-5 Units Subcutaneous QHS  . ipratropium-albuterol  3 mL Nebulization Q4H  . levofloxacin  500 mg Oral QODAY  . methylPREDNISolone (SOLU-MEDROL) injection  60 mg Intravenous Q6H  . metoprolol tartrate  50 mg Oral BID  . oxybutynin  5 mg Oral QHS  . pantoprazole  40 mg Oral Daily  . PARoxetine  20 mg Oral BH-q7a  . simvastatin  20 mg Oral q1800  . sodium chloride  3 mL Intravenous Q12H  . sodium chloride  3 mL Intravenous Q12H   Continuous Infusions:   Principal Problem:   Acute respiratory failure Active Problems:   Hypertension   Dyslipidemia   Type II or unspecified type diabetes mellitus without mention of complication, not stated as uncontrolled   Dysphagia, pharyngoesophageal phase   Acute on chronic diastolic CHF (congestive heart failure)   COPD with exacerbation   Acute on chronic renal failure    Time spent: 86mins    [name]  [facility] Pager  [phone]. If 7PM-7AM, please contact night-coverage at [email or web address], password [facility] 12/12/2013, 8:08 PM  LOS: 2 days

## 2013-12-12 NOTE — Evaluation (Signed)
Physical Therapy Evaluation Patient Details Name: [name] MRN: [id number] DOB: [birth year] Today's Date: 12/12/2013   History of Present Illness  HPI: [name] is a 80 y.o. female with a past medical history that includes asthma, hypertension, chronic kidney disease stage III, diabetes presents to the emergency department from a nursing facility with chief complaint of worsening shortness of breath. Information is obtained from the daughter who is at the bedside. Daughter reports that she noticed some mild increased work of breathing with exertion and wheezing when the patient was visiting for the Easter holiday. Since that time the shortness of breath and wheezing has worsened. Associated symptoms include productive cough with thick white sputum. This morning the staff of the facility noted obvious increased work of breathing about patient and called EMS. EMS reports oxygen saturation level at that time was 92% on room air. She was given to do a nebs at the facility and one albuterol nebulizer by EMS en route. There is no report of recent fever chills nausea vomiting diarrhea. No complaints of chest pain palpitations headache dizziness syncope or near-syncope. Initial evaluation included a chest x-ray revealing mild vascular congestion with mild by basilar atelectasis. ProBNP elevated to 849. Basic metabolic panel significant for a creatinine of 1.89 serum glucose of 148. Complete blood count significant for hemoglobin of 10.2. Oxygen saturation levels 94% on 2 L of oxygen. She is hemodynamically stable with a mildly elevated blood pressure. She is afebrile  Clinical Impression  Patient displays good LE and UE strength (4/5 MMT) but fatigues quickly with gait secondary to mild to severe non-productive cough with patient displaying increased coughing when laying supine in bed (done for repositioning patient in bed) vs with sitting up and walking. Patient ambulated <195ft but initially  displayed good gait mechanics, though decreased stride rate, though patient's gait speed became increasingly slower as patient fatigues. Patient also displays poor standing balance unable to march with Bilateral UE support. History received from patient's daughters during session stating patient was at [facility] for which patient will be approriatte to return to. Anticipating improving endurance and returning to PLOF with prior to hospital discharge as patient's symptoms and medical condition stabilize and improve. Physical therapy will continue to see patient until hospital discharge.     Follow Up Recommendations SNF;Home health PT    Equipment Recommendations  Rolling walker with 5" wheels    Recommendations for Other Services       Precautions / Restrictions Precautions Precautions: Fall Precaution Comments: secondary to weakness/fatigue Restrictions Weight Bearing Restrictions: No      Mobility  Bed Mobility Overal bed mobility: Modified Independent;Needs Assistance             General bed mobility comments: assistance only required for set up  Transfers Overall transfer level: Needs assistance Equipment used: Rolling walker (2 wheeled) Transfers: Sit to/from Stand Sit to Stand: Min guard         General transfer comment: requires cues for sequencing.   Ambulation/Gait Ambulation/Gait assistance: Min guard Ambulation Distance (Feet): 90 Feet Assistive device: Rolling walker (2 wheeled) Gait Pattern/deviations: Trunk flexed;Decreased stride length   Gait velocity interpretation: <1.8 ft/sec, indicative of risk for recurrent falls    Stairs            Wheelchair Mobility    Modified Rankin (Stroke Patients Only)       Balance Overall balance assessment: Needs assistance Sitting-balance support: Feet supported;No upper extremity supported Sitting balance-Leahy Scale: Good  Standing balance support: Bilateral upper extremity supported Standing  balance-Leahy Scale: Poor                               Pertinent Vitals/Pain Thoracic spine pain with prolonged standing and gait.     Home Living Family/patient expects to be discharged to:: Skilled nursing facility                      Prior Function Level of Independence: Independent with assistive device(s);Needs assistance   Gait / Transfers Assistance Needed: contact guard assist  ADL's / Homemaking Assistance Needed: min to moderate assistance        Hand Dominance        Extremity/Trunk Assessment   Upper Extremity Assessment: Overall WFL for tasks assessed           Lower Extremity Assessment: Overall WFL for tasks assessed         Communication      Cognition Arousal/Alertness: Awake/alert Behavior During Therapy: WFL for tasks assessed/performed Overall Cognitive Status: Within Functional Limits for tasks assessed                      General Comments      Exercises General Exercises - Lower Extremity Ankle Circles/Pumps: AROM;10 reps;Both;Supine Heel Slides: AROM;Both;10 reps;Supine Hip ABduction/ADduction: AROM;Both;10 reps;Supine Other Exercises Other Exercises: Bridges, hooklaying, 10x,       Assessment/Plan    PT Assessment Patient needs continued PT services  PT Diagnosis Difficulty walking;Generalized weakness   PT Problem List Decreased strength;Decreased activity tolerance;Decreased balance  PT Treatment Interventions Gait training;Functional mobility training   PT Goals (Current goals can be found in the Care Plan section) Acute Rehab PT Goals Patient Stated Goal: to walk 139ft PT Goal Formulation: With patient Time For Goal Achievement: 12/19/13 Potential to Achieve Goals: Good    Frequency Min 4X/week   Barriers to discharge   Patient DC to assistive living facility or SNF    Co-evaluation               End of Session Equipment Utilized During Treatment: Gait belt Activity  Tolerance: Patient tolerated treatment well Patient left: in bed;with call bell/phone within reach;with family/visitor present Nurse Communication: Mobility status         Time: 1545-1620 PT Time Calculation (min): 35 min   Charges:   PT Evaluation $Initial PT Evaluation Tier I: 1 Procedure PT Treatments $Gait Training: 8-22 mins $Therapeutic Exercise: 8-22 mins   PT G Codes:          Ursala Cressy R Jenni Thew PT DPT 12/12/2013, 4:30 PM

## 2013-12-13 ENCOUNTER — Inpatient Hospital Stay (HOSPITAL_COMMUNITY): Payer: Medicare Other

## 2013-12-13 LAB — CBC
HCT: 32.9 % — ABNORMAL LOW (ref 36.0–46.0)
Hemoglobin: 10.2 g/dL — ABNORMAL LOW (ref 12.0–15.0)
MCH: 28.8 pg (ref 26.0–34.0)
MCHC: 31 g/dL (ref 30.0–36.0)
MCV: 92.9 fL (ref 78.0–100.0)
PLATELETS: 245 10*3/uL (ref 150–400)
RBC: 3.54 MIL/uL — ABNORMAL LOW (ref 3.87–5.11)
RDW: 15.4 % (ref 11.5–15.5)
WBC: 7.7 10*3/uL (ref 4.0–10.5)

## 2013-12-13 LAB — GLUCOSE, CAPILLARY
GLUCOSE-CAPILLARY: 240 mg/dL — AB (ref 70–99)
GLUCOSE-CAPILLARY: 227 mg/dL — AB (ref 70–99)
Glucose-Capillary: 185 mg/dL — ABNORMAL HIGH (ref 70–99)

## 2013-12-13 LAB — BASIC METABOLIC PANEL
BUN: 86 mg/dL — ABNORMAL HIGH (ref 6–23)
CALCIUM: 8.9 mg/dL (ref 8.4–10.5)
CO2: 28 mEq/L (ref 19–32)
Chloride: 100 mEq/L (ref 96–112)
Creatinine, Ser: 1.83 mg/dL — ABNORMAL HIGH (ref 0.50–1.10)
GFR, EST AFRICAN AMERICAN: 27 mL/min — AB (ref >90)
GFR, EST NON AFRICAN AMERICAN: 23 mL/min — AB (ref >90)
Glucose, Bld: 175 mg/dL — ABNORMAL HIGH (ref 70–99)
Potassium: 4.7 mEq/L (ref 3.7–5.3)
Sodium: 142 mEq/L (ref 137–147)

## 2013-12-13 MED ORDER — PREDNISONE 20 MG PO TABS
60.0000 mg | ORAL_TABLET | Freq: Every day | ORAL | Status: DC
  Administered 2013-12-14: 60 mg via ORAL
  Filled 2013-12-13: qty 3

## 2013-12-13 MED ORDER — FUROSEMIDE 40 MG PO TABS
60.0000 mg | ORAL_TABLET | Freq: Two times a day (BID) | ORAL | Status: DC
  Administered 2013-12-13 – 2013-12-14 (×2): 60 mg via ORAL
  Filled 2013-12-13 (×4): qty 1

## 2013-12-13 NOTE — Progress Notes (Signed)
Patient sitting onside of bed this am eating breakfast,however while eating she has frequent coughing episodes,Karen Black NP notified. Will continue to monitor patient, family at the bedside.

## 2013-12-13 NOTE — Progress Notes (Signed)
SLP Cancellation Note  Patient Details Name: [name] MRN: [id number] DOB: Jul 10, 1922   Cancelled treatment:       Reason Eval/Treat Not Completed: Other (comment) SLP spoke with pt and both daughters at bedside this afternoon. Pt known to SLP from outpt MBSS at the end of February 2015. Pt judged to be at high risk for aspiration when eating/drinking quickly. Her daughter reports increased coughing episodes since last MBSS. Pt presented with delayed cough after sips thin at bedside today. Will complete MBSS tomorrow AM. Above to [name], [name].  Thank you,  [name], [name]    [name] 12/13/2013, 4:53 PM

## 2013-12-13 NOTE — Progress Notes (Signed)
Checked on PT at 02:00 am she was asleep. Holding neb till she wakes.

## 2013-12-13 NOTE — Progress Notes (Signed)
Patient seen and examined. Above note reviewed.  From a respiratory standpoint, patient is improving. Her wheezing has resolved she is currently on a prednisone taper. She appears to be euvolemic and her home dose of Lasix has been resumed. Creatinine appears to be stable. She continues to cough which is likely related to repeated aspiration. Speech therapy evaluation and physical therapy evaluation are currently pending. We'll need these evaluations to determine appropriate disposition. Anticipate disposition in the next 24 hours.  [employer]

## 2013-12-13 NOTE — Plan of Care (Signed)
Problem: Phase II Progression Outcomes Goal: Activity at appropriate level-compared to baseline (UP IN CHAIR FOR HEMODIALYSIS)  Outcome: Progressing Out of bed to chair today.

## 2013-12-13 NOTE — Progress Notes (Signed)
Physical Therapy Treatment Patient Details Name: [name] MRN: [id number] DOB: [birth year] Today's Date: 12/13/2013    Subjective   "I am so weak"    PT Comments    Nonproductive coughing with no secretion.  PT session focus on LE strengthening and gait training.  Pt lethargic through session believe related to medication and decreased sleep last night.  Gait training with min guard with cueing for posture and to increase stride length for energy conservation and more normalized gait mechanics noted decreased gait velocity longer ambulating.  No LOB episodes through session, pt limited by fatigue.  End of session pt left in chair with call bell within reach and daughter/aide in room.  No reports of pain through session.    Follow Up Recommendations        Equipment Recommendations       Recommendations for Other Services       Precautions / Restrictions Precautions Precautions: Fall Restrictions Weight Bearing Restrictions: No    Mobility  Bed Mobility Overal bed mobility: Independent                Transfers   Equipment used: Rolling walker (2 wheeled) Transfers: Sit to/from Stand Sit to Stand: Min guard         General transfer comment: cueing for HHA for safety and assistance  Ambulation/Gait Ambulation/Gait assistance: Min guard Ambulation Distance (Feet): 105 Feet Assistive device: Rolling walker (2 wheeled) Gait Pattern/deviations: Decreased stride length;Trunk flexed Gait velocity: slow and labored Gait velocity interpretation: <1.8 ft/sec, indicative of risk for recurrent falls     Stairs            Wheelchair Mobility    Modified Rankin (Stroke Patients Only)       Balance                                    Cognition Arousal/Alertness: Lethargic Behavior During Therapy: WFL for tasks assessed/performed Overall Cognitive Status: Within Functional Limits for tasks assessed                       Exercises General Exercises - Lower Extremity Ankle Circles/Pumps: AROM;10 reps;Both;Supine Heel Slides: AROM;Both;10 reps;Supine Hip ABduction/ADduction: AROM;Both;10 reps;Supine Straight Leg Raises: AROM;Both;5 reps    General Comments        Pertinent Vitals/Pain No c/o pain.    Home Living                      Prior Function            PT Goals (current goals can now be found in the care plan section) Progress towards PT goals: Progressing toward goals    Frequency       PT Plan      Co-evaluation             End of Session Equipment Utilized During Treatment: Gait belt Activity Tolerance: Patient limited by fatigue;Patient tolerated treatment well Patient left: in chair;with call bell/phone within reach;with chair alarm set;with nursing/sitter in room;with family/visitor present     Time: 1020-1055 PT Time Calculation (min): 35 min  Charges:  $Gait Training: 8-22 mins $Therapeutic Exercise: 8-22 mins                    G Codes:      Aldona Lento 12/13/2013, 11:01 AM

## 2013-12-13 NOTE — Clinical Social Work Psychosocial (Signed)
Clinical Social Work Department BRIEF PSYCHOSOCIAL ASSESSMENT 12/13/2013  Patient:  EDIT, RICCIARDELLI     Account Number:  [id number]     Admit date:  12/10/2013  Clinical Social Worker:  Edwyna Shell, [place]  Date/Time:  12/13/2013 09:00 AM  Referred by:  Physician  Date Referred:  12/13/2013 Referred for  SNF Placement   Other Referral:   Interview type:  Patient Other interview type:   Also spoke w facility and daughter, Fraser Din    PSYCHOSOCIAL DATA Living Status:  FACILITY Admitted from facility:  [facility] Level of care:  Assisted Living Primary support name:  Leonides Cave Primary support relationship to patient:  CHILD, ADULT Degree of support available:   Supportive family, P Seachrest is POA and lives in [place].  Other daughters involved but live in [place]. Current resident of [facility].    CURRENT CONCERNS Current Concerns  Post-Acute Placement   Other Concerns:    SOCIAL WORK ASSESSMENT / PLAN CSW met w patient at bedside, patient alert and oriented x4.  Frequent episodes of coughing/choking, says she is "exhausted" by constant respiratory issues and cough.  Says she has been at [facility] approx 1 month, it is "OK." Would rather  be at home where she was living prior to [facility].  Has 3 daughters who are all involved in her care; however, Leonides Cave is the designated POA. Wants CSW to speak w [place].  Would prefer to return to [facility] [place] will clarify PT recommendation of return to facility/SNF.  [facility] is ALF.   Assessment/plan status:  Psychosocial Support/Ongoing Assessment of Needs Other assessment/ plan:   Information/referral to community resources:   SNF list as placement may be needed.    PATIENT'S/FAMILY'S RESPONSE TO PLAN OF CARE: Patient says she is "tired of being sick", say she is exhausted.        Edwyna Shell, LCSW Clinical Social Worker [phone])

## 2013-12-13 NOTE — Progress Notes (Signed)
[facility] PROGRESS NOTE  [name] HCW:[id number] DOB: 31-Jan-1922 DOA: 12/10/2013 PCP: No primary provider on file.  Assessment/Plan: 1. Acute respiratory failure. Likely multifactorial due to CHF and COPD. Resolved. Oxygen saturation level 100% on 3L.  Continue to wean oxygen. 2. COPD exacerbation. Continue bronchodilators. Transition intravenous steroids to po. She is receiving levofloxacin for antibiotic coverage. Once her acute exacerbation has passed, she would likely benefit from outpatient pulmonary function tests. 3. Acute on chronic diastolic congestive heart failure. Improved with IV Lasix. Echocardiogram does indicate mitral stenosis as well as elevated pulmonary artery pressures. This may be related to an underlying pulmonary illness such as #1. Volume status -3L. Continue current treatments. She'll likely benefit from an outpatient cardiology followup. 4. Hypertension. Remains  stable. Continue current treatments. 5. Acute on chronic renal failure, stage III. Creatinine is slightly lower today. IV lasix discontinued yesterda. Urine output is adequate. Restart oral lasix. 6. Diabetes. Continue sliding scale insulin. CBG 185-240. 7. Aspiration. Patient has hx of silent aspiration that appears to have progressed to repeatedly aspirating with by mouth intake and difficulty managing own secretion. Await speech therapy evaluation.    Code Status: DNR Family Communication: two daughters at bedside Disposition Plan: back to facility likely tomorrow   Consultants:  none  Procedures: Echo:- Left ventricle: The cavity size was normal. There was mild concentric hypertrophy. Systolic function was vigorous. The estimated ejection fraction was in the range of 65% to 70%. Wall motion was normal; there were no regional wall motion abnormalities. Doppler parameters are consistent with abnormal left ventricular relaxation (grade 1 diastolic dysfunction). Doppler parameters  are consistent with elevated ventricular end-diastolic filling pressure. - Aortic valve: Trileaflet; moderately thickened, moderately calcified leaflets. Valve mobility was restricted. Transvalvular velocity was within the normal range. There was no stenosis. Mild regurgitation. - Aortic root: The aortic root was normal in size. - Mitral valve: Severely thickened and calcified mitral valve leaflets predominantly posterior. Calcified annulus. Mean and peak transvalvular velocity was 7 and 16 mmHg, consecutively, consistent with moderate mitral stenosis. Mild regurgitation. - Left atrium: The atrium was moderately dilated. - Right ventricle: The cavity size was mildly dilated. Wall thickness was normal. Systolic function was normal. - Pulmonary arteries: Systolic pressure was severely increased. PA peak pressure: 68mm Hg (S). Impressions:  - Moderate mitral stenosis woth severely elevated RVSP 64 mmHg.   Antibiotics:  Levaquin 12/11/13>>  HPI/Subjective: Up in chair eating lunch and coughing. Denies pain/discomfort.  Objective: Filed Vitals:   12/13/13 1245  BP: 140/58  Pulse: 57  Temp: 98 F (36.7 C)  Resp: 20    Intake/Output Summary (Last 24 hours) at 12/13/13 1253 Last data filed at 12/13/13 1247  Gross per 24 hour  Intake    720 ml  Output   2250 ml  Net  -1530 ml   Filed Weights   12/11/13 0449 12/12/13 0400 12/13/13 0700  Weight: 98.2 kg (216 lb 7.9 oz) 99.5 kg (219 lb 5.7 oz) 99.8 kg (220 lb 0.3 oz)    Exam:   General:  Obese appears comfortable  Cardiovascular: RRR No MGR No LE edema  Respiratory: normal effort. Mild expiratory wheeze. Frequent coughing with deep breath.   Abdomen: obese soft +BS non-tender to palpation   Musculoskeletal: no clubbing or cyanosis   Data Reviewed: Basic Metabolic Panel:  Recent Labs Lab 12/10/13 0928 12/11/13 0620 12/12/13 0622 12/13/13 0504  NA 143 141 137 142  K 4.4 4.2 4.6 4.7  CL 103 100 96 100  CO2  27 25 25 28   GLUCOSE 148* 189* 175* 175*  BUN 58* 66* 83* 86*  CREATININE 1.89* 1.87* 1.97* 1.83*  CALCIUM 9.5 9.5 9.2 8.9   Liver Function Tests:  Recent Labs Lab 12/10/13 0928  AST 19  ALT 11  ALKPHOS 83  BILITOT 0.4  PROT 7.9  ALBUMIN 3.8   No results found for this basename: LIPASE, AMYLASE,  in the last 168 hours No results found for this basename: AMMONIA,  in the last 168 hours CBC:  Recent Labs Lab 12/10/13 0928 12/11/13 0620 12/12/13 0622 12/13/13 0504  WBC 10.3 13.1* 13.8* 7.7  NEUTROABS 7.8*  --   --   --   HGB 10.2* 10.1* 10.2* 10.2*  HCT 32.4* 31.7* 32.6* 32.9*  MCV 92.6 91.9 93.1 92.9  PLT 226 236 273 245   Cardiac Enzymes:  Recent Labs Lab 12/10/13 0928  TROPONINI <0.30   BNP (last 3 results)  Recent Labs  12/10/13 0928  PROBNP 856.7*   CBG:  Recent Labs Lab 12/12/13 1115 12/12/13 1625 12/12/13 2115 12/13/13 0742 12/13/13 1143  GLUCAP 172* 181* 231* 185* 240*    No results found for this or any previous visit (from the past 240 hour(s)).   Studies: No results found.  Scheduled Meds: . allopurinol  200 mg Oral Daily  . aspirin EC  81 mg Oral Daily  . carbamide peroxide  5 drop Both Ears BID  . enoxaparin (LOVENOX) injection  30 mg Subcutaneous Q24H  . fenofibrate  54 mg Oral Daily  . guaiFENesin  1,200 mg Oral BID  . insulin aspart  0-20 Units Subcutaneous TID WC  . insulin aspart  0-5 Units Subcutaneous QHS  . ipratropium-albuterol  3 mL Nebulization Q4H  . levofloxacin  500 mg Oral QODAY  . methylPREDNISolone (SOLU-MEDROL) injection  60 mg Intravenous Q6H  . metoprolol tartrate  50 mg Oral BID  . oxybutynin  5 mg Oral QHS  . pantoprazole  40 mg Oral Daily  . PARoxetine  20 mg Oral BH-q7a  . simvastatin  20 mg Oral q1800  . sodium chloride  3 mL Intravenous Q12H  . sodium chloride  3 mL Intravenous Q12H   Continuous Infusions:   Principal Problem:   Acute respiratory failure Active Problems:   Hypertension    Dyslipidemia   Type II or unspecified type diabetes mellitus without mention of complication, not stated as uncontrolled   Dysphagia, pharyngoesophageal phase   Acute on chronic diastolic CHF (congestive heart failure)   COPD with exacerbation   Acute on chronic renal failure    Time spent: [facility] Pager [phone]. If 7PM-7AM, please contact night-coverage at [email or web address], password [facility] 12/13/2013, 12:53 PM  LOS: 3 days

## 2013-12-13 NOTE — Care Management Note (Addendum)
    Page 1 of 2   12/14/2013     3:13:29 PM   CARE MANAGEMENT NOTE 12/14/2013  Patient:  [name], [name]   Account Number:  [id number]  Date Initiated:  12/13/2013  Documentation initiated by:  Theophilus Kinds  Subjective/Objective Assessment:   Pt admitted from [facility]. Pt will return to facility at discharge.     Action/Plan:   CSW to arrange discharge to facility when medically stable.   Anticipated DC Date:  12/15/2013   Anticipated DC Plan:  ASSISTED LIVING / REST HOME  In-house referral  Clinical Social Worker      DC Forensic scientist  CM consult      [facility] Choice  HOME HEALTH   Choice offered to / List presented to:  C-2 HC POA / Guardian        HH arranged  [place].   Status of service:  Completed, signed off Medicare Important Message given?  YES (If response is "NO", the following Medicare IM given date fields will be blank) Date Medicare IM given:  12/14/2013 Date Additional Medicare IM given:    Discharge Disposition:  ASSISTED LIVING  Per UR Regulation:    If discussed at Long Length of Stay Meetings, dates discussed:    Comments:  12/14/13 [place], RN BSN CM Pt discharged back to [facility] with [facility] PT and OT (per pts choice). Romualdo Bolk of [facility] is aware and will collect pts information from the chart. [place] services to start within 48 hours of discharge. No DME needs noted as pt does not qualify for home O2 (pts ambulating sats 96% on room air). Pts daughter and pts nurse aware of discharge arrangements.  12/13/13 [place], RN BSN CM

## 2013-12-14 ENCOUNTER — Inpatient Hospital Stay (HOSPITAL_COMMUNITY): Payer: Medicare Other

## 2013-12-14 LAB — BASIC METABOLIC PANEL
BUN: 82 mg/dL — ABNORMAL HIGH (ref 6–23)
CALCIUM: 8.7 mg/dL (ref 8.4–10.5)
CO2: 29 mEq/L (ref 19–32)
Chloride: 102 mEq/L (ref 96–112)
Creatinine, Ser: 1.79 mg/dL — ABNORMAL HIGH (ref 0.50–1.10)
GFR calc Af Amer: 28 mL/min — ABNORMAL LOW (ref >90)
GFR calc non Af Amer: 24 mL/min — ABNORMAL LOW (ref >90)
Glucose, Bld: 130 mg/dL — ABNORMAL HIGH (ref 70–99)
Potassium: 4.3 mEq/L (ref 3.7–5.3)
Sodium: 143 mEq/L (ref 137–147)

## 2013-12-14 LAB — GLUCOSE, CAPILLARY
Glucose-Capillary: 148 mg/dL — ABNORMAL HIGH (ref 70–99)
Glucose-Capillary: 118 mg/dL — ABNORMAL HIGH (ref 70–99)
Glucose-Capillary: 322 mg/dL — ABNORMAL HIGH (ref 70–99)

## 2013-12-14 MED ORDER — FENOFIBRATE 54 MG PO TABS: 54.0000 mg | ORAL_TABLET | Freq: Every day | ORAL | Status: DC

## 2013-12-14 MED ORDER — LEVOFLOXACIN 500 MG PO TABS: 500.0000 mg | ORAL_TABLET | ORAL | Status: DC

## 2013-12-14 MED ORDER — PREDNISONE 10 MG PO TABS: ORAL_TABLET | ORAL | Status: DC

## 2013-12-14 NOTE — Evaluation (Signed)
Physical Therapy Evaluation Patient Details Name: [name] MRN: [id number] DOB: 1922/02/29 Today's Date: 12/14/2013   History of Present Illness  Family is concerned that the pt should go to SNF at d/c due to significant deconditioning.  Therefore, reassessment was done this afternoon.  Clinical Impression   Pt was up in a chair and reported feeling well.  She continued to be on 2 L supplemental O2.  Pt was found to be deconditioned, but functionally she is close to previous functional level.  She was independent with transfers in and out of bed and was able to ambulate 150' using a walker and SBA.  Pt had been taken off of supplemental O2 in order to assess respiratory needs.  At rest, on 2 L O2 sat =96%.   Off of supplemenal  O2, O2 sat=94%.  With exertion, O2 sat=96% on RA.  Pt was left off of O2 and RN was alerted.  Pt should be able to transition to ACLF at d/c but I would recommend HHPT and HHOT for generalized deconditioning.    Follow Up Recommendations Home health PT    Equipment Recommendations  None recommended by PT    Recommendations for Other Services OT consult     Precautions / Restrictions Precautions Precautions: Fall Restrictions Weight Bearing Restrictions: No      Mobility  Bed Mobility Overal bed mobility: Modified Independent                Transfers   Equipment used: Rolling walker (2 wheeled) Transfers: Sit to/from Stand Sit to Stand: Modified independent (Device/Increase time)            Ambulation/Gait Ambulation/Gait assistance: Supervision Ambulation Distance (Feet): 150 Feet Assistive device: Rolling walker (2 wheeled) Gait Pattern/deviations: Trunk flexed   Gait velocity interpretation: at or above normal speed for age/gender General Gait Details: pt did need to stop several times to rest due to fatigue  Stairs:  N/A                                                                 Home Living Family/patient expects to be discharged to:: Assisted living               Home Equipment: Walker - 2 wheels;Cane - single point;Wheelchair - Regulatory affairs officer / Transfers Assistance Needed: independent in transfers, ambulated with walker independently  ADL's / Homemaking Assistance Needed: assist to lay out clothes due to macular degeneration, min assist with bathing, assist with dosing of medications                Extremity/Trunk Assessment   Upper Extremity Assessment: Generalized weakness           Lower Extremity Assessment: Generalized weakness               Cognition Arousal/Alertness: Awake/alert Behavior During Therapy: WFL for tasks assessed/performed Overall Cognitive Status: Within Functional Limits for tasks assessed                                    Assessment/Plan    PT Assessment All further PT needs can be met  in the next venue of care  PT Diagnosis Difficulty walking;Generalized weakness   PT Problem List Decreased strength;Decreased activity tolerance;Decreased mobility;Obesity  PT Treatment Interventions Gait training;Functional mobility training;Therapeutic exercise   PT Goals (Current goals can be found in the Care Plan section) Acute Rehab PT Goals Patient Stated Goal: none stated...daughter would like for stamina to be improved PT Goal Formulation: With patient/family Time For Goal Achievement: 12/28/13 Potential to Achieve Goals: Good    Frequency Min 3X/week   Barriers to discharge   at ACLF                   End of Session Equipment Utilized During Treatment: Gait belt Activity Tolerance: Patient tolerated treatment well Patient left: in chair;with call bell/phone within reach;with chair alarm set Nurse Communication: Mobility status         Time: 1414-1450 PT Time Calculation (min): 36 min   Charges:   PT Evaluation $PT Re-evaluation: 1  Procedure     PT G Codes:          [name] 12/14/2013, 3:09 PM

## 2013-12-14 NOTE — Discharge Summary (Signed)
Addendum to discharge summary dated 12/14/13:  Disregard Modified barium swallow results noted under "Procedures".   Procedures: Modified Barium swallow done 12/14/13  Pt presents with mild pharyngeal phase dysphagia and suspected primary esophageal phase dysphagia characterized by delay in swallow initiation and decreased airway protection with liquids resulting in gross aspiration of nectars by teaspoon before the swallow triggered which triggered strong coughing episode and periods of pt inability to catch her breath (pt silent and then gasping). Aspirate was removed, however coughing persisted. Pt demonstrated improved control with self presented cup sips of thin liquids (no penetration or aspiration), however she needs verbal cues to take single sips only. Esophageal sweep revealed barium/food filled esophagus with poor clearance of bolus. During coughing episodes, bolus was forced back through LES into cervical esophagus. Pharyngeal strength appears WFL, however safe swallow function is negatively impacted by respiratory issues (COPD), esophageal motility, and cognition (pt attempts to self feed immediately after choking episodes). Study was reviewed at length with pt's daughter, Fraser Din. Recommend mechanical soft/D3 and thin liquids by cup. NO straws. Single sips only.   Recommendations   Dysphagia 3 (Mechanical Soft);Thin liquid  Liquid Administration via: Cup;No straw  Medication Administration: Whole meds with puree  Supervision: Full supervision/cueing for compensatory strategies  Compensations: Slow rate;Small sips/bites  Postural Changes and/or Swallow Maneuvers: Out of bed for meals;Seated upright 90 degrees;Upright 30-60 min after meal    Lezlie Octave. Julieann Drummonds NP

## 2013-12-14 NOTE — Clinical Social Work Note (Signed)
CSW spoke w Leonides Cave, patient's daughter and [place].  Strongly wants mother placed at [facility], is considering paying privately if [facility] will not authorize.  Feels mother's needs will be met better met at SNF, expressed concerns w nebulizer treatments, MD availability, and feeding precautions.  Very concerned about mother's aspiration issues.  P Sechrest has spoken w [facility] admissions, believes they have a bed available.  Says patient is very happy at [facility] and will likely want to return; however, daughters want to explore possibliity of SNF.  CSW explained process of bed search and insurance authorization.  Will initiate process and CSW Elaina Pattee will follow up this afternoon.  Edwyna Shell, LCSW Clinical Social Worker [phone])

## 2013-12-14 NOTE — Discharge Summary (Signed)
Physician Discharge Summary  [name] DGL:[id number] DOB: [birth year] DOA: 12/10/2013  PCP: No primary provider on file.  Admit date: 12/10/2013 Discharge date: 12/14/2013  Time spent: 40 minutes  Recommendations for Outpatient Follow-up:  1. Follow up with PCP 1 week for evaluation of respiratory status and volume status 2. Speech therapy at facility  3. Discharge to [place] with [facility] PT   Discharge Diagnoses:  Principal Problem:   Acute respiratory failure Active Problems:   Hypertension   Dyslipidemia   Type II or unspecified type diabetes mellitus without mention of complication, not stated as uncontrolled   Dysphagia, pharyngoesophageal phase   Acute on chronic diastolic CHF (congestive heart failure)   COPD with exacerbation   Acute on chronic renal failure   Discharge Condition: stable  Diet recommendation: regular diet thin liquids  Filed Weights   12/11/13 0449 12/12/13 0400 12/13/13 0700  Weight: 98.2 kg (216 lb 7.9 oz) 99.5 kg (219 lb 5.7 oz) 99.8 kg (220 lb 0.3 oz)    History of present illness:  [name] is a 80 y.o. female with a past medical history that includes asthma, hypertension, chronic kidney disease stage III, diabetes presented to the emergency department on 12/10/13 from a nursing facility with chief complaint of worsening shortness of breath. Information  obtained from the daughter who was at the bedside. Daughter reported that she noticed some mild increased work of breathing with exertion and wheezing when the patient was visiting for the Easter holiday. Since that time the shortness of breath and wheezing had worsened. Associated symptoms included productive cough with thick white sputum. On the morning of admission, the staff of the facility noted obvious increased work of breathing about patient and called EMS. EMS reported oxygen saturation level at that time was 92% on room air. She was given to duonebs at the facility and  one albuterol nebulizer by EMS en route. There is no report of recent fever chills nausea vomiting diarrhea. No complaints of chest pain palpitations headache dizziness syncope or near-syncope. Initial evaluation included a chest x-ray revealing mild vascular congestion with mild by basilar atelectasis. ProBNP elevated to 849. Basic metabolic panel significant for a creatinine of 1.89 serum glucose of 148. Complete blood count significant for hemoglobin of 10.2. Oxygen saturation levels 94% on 2 L of oxygen. She was hemodynamically stable with a mildly elevated blood pressure. She was afebrile   In the emergency department she was given Lasix 40 mg intravenously as well as nebulizers. She is also provided with 125 mg of Solu-Medrol   Hospital Course:  1. Acute respiratory failure. Likely multifactorial due to CHF and COPD.given nebs and lasix. Resolved. Oxygen saturation level 97% on 2L. Continue to wean oxygen. 2. COPD exacerbation. Continue bronchodilators. Prednisone taper at discharge. She is receiving levofloxacin for antibiotic coverage. three more doses after discharge to complete 5 doses. She would likely benefit from outpatient pulmonary function tests. 3. Acute on chronic diastolic congestive heart failure. Improved with IV Lasix. Echocardiogram does indicate mitral stenosis as well as elevated pulmonary artery pressures. This may be related to an underlying pulmonary illness such as #1. Volume status -4.5L. Continue current treatments. She'll likely benefit from an outpatient cardiology followup. 4. Hypertension. Remains stable.  5. Acute on chronic renal failure, stage III. At baseline. Urine output is adequate.  6. Diabetes. Continue sliding scale insulin. hgA1c 6.5.   Aspiration. Patient has hx of silent aspiration that appears to have progressed to repeatedly aspirating with by  mouth intake and difficulty managing own secretion. Modified barium swallow results as below. Recommending  regular;Thin liquid  Liquid Administration via: Cup;Straw (chin neutral to down) Medication Administration: Whole meds with puree (pt preference) Supervision: Full supervision/cueing for compensatory strategies Compensations: Slow rate;Small sips/bites Postural Changes and/or Swallow Maneuvers: Out of bed for meals;Seated upright 90 degrees;Upright 30-60 min after meal (chin neutral to down) Oral Care Recommendations: Oral care BID   Procedures: Echo:- Left ventricle: The cavity size was normal. There was mild concentric hypertrophy. Systolic function was vigorous. The estimated ejection fraction was in the range of 65% to 70%. Wall motion was normal; there were no regional wall motion abnormalities. Doppler parameters are consistent with abnormal left ventricular relaxation (grade 1 diastolic dysfunction). Doppler parameters are consistent with elevated ventricular end-diastolic filling pressure. - Aortic valve: Trileaflet; moderately thickened, moderately calcified leaflets. Valve mobility was restricted. Transvalvular velocity was within the normal range. There was no stenosis. Mild regurgitation. - Aortic root: The aortic root was normal in size. - Mitral valve: Severely thickened and calcified mitral valve leaflets predominantly posterior. Calcified annulus. Mean and peak transvalvular velocity was 7 and 16 mmHg, consecutively, consistent with moderate mitral stenosis. Mild regurgitation. - Left atrium: The atrium was moderately dilated. - Right ventricle: The cavity size was mildly dilated. Wall thickness was normal. Systolic function was normal. - Pulmonary arteries: Systolic pressure was severely increased. PA peak pressure: 51mm Hg (S). Impressions:  - Moderate mitral stenosis woth severely elevated RVSP 64 mmHg.   Modified barium swallow: Mild pharyngeal phase dysphagia  Clinical impression: Swallow function essentially within functional limits when pt goes slowly and  takes only one bite/sip at a time. Mild premature spillage noted with multiple cup sips thin with swallow trigger after filling the valleculae. Flash penetration occurred on about half of trials which elicited throat clearing and coughing (coughing seemed out of proportion to the trace penetration that was observed). No aspiration or residuals noted. Pt requires verbal cues to "slow down" and "take one sip only". When she adheres to rate control and bolus size, she had no issues.     Consultations:  Speech therapy  Discharge Exam: Filed Vitals:   12/14/13 1115  BP: 132/73  Pulse: 64  Temp:   Resp:     General: well nourished NAD Cardiovascular: RRR No MGR No LE edema PPP Respiratory: normal effort BS course with mild expiratory wheeze  Discharge Instructions You were cared for by a hospitalist during your hospital stay. If you have any questions about your discharge medications or the care you received while you were in the hospital after you are discharged, you can call the unit and asked to speak with the hospitalist on call if the hospitalist that took care of you is not available. Once you are discharged, your primary care physician will handle any further medical issues. Please note that NO REFILLS for any discharge medications will be authorized once you are discharged, as it is imperative that you return to your primary care physician (or establish a relationship with a primary care physician if you do not have one) for your aftercare needs so that they can reassess your need for medications and monitor your lab values.     Medication List         acetaminophen-codeine 300-30 MG per tablet  Commonly known as:  TYLENOL #3  Take 1 tablet by mouth every 6 (six) hours as needed for moderate pain.     AEROCHAMBER MV inhaler  Use  as instructed. 2 puffs BID     QC NAPROXEN SODIUM 220 MG tablet  Generic drug:  naproxen sodium  TAKE TABLET BY MOUTH EACH MORNING.     ALEVE 220 MG  tablet  Generic drug:  naproxen sodium  Take 220 mg by mouth daily.     allopurinol 100 MG tablet  Commonly known as:  ZYLOPRIM  TAKE (2) TABLETS BY MOUTH ONCE DAILY.     ASPIRIN LOW DOSE 81 MG EC tablet  Generic drug:  aspirin  TAKE 1 TABLET BY MOUTH ONCE DAILY.     Cranberry 475 MG Caps  Take 1 capsule by mouth every 12 (twelve) hours.     diphenhydrAMINE 25 MG tablet  Commonly known as:  BENADRYL  Take 25 mg by mouth every 6 (six) hours as needed for itching.     fenofibrate 54 MG tablet  Take 1 tablet (54 mg total) by mouth daily.     furosemide 40 MG tablet  Commonly known as:  LASIX  TAKE 1 & 1/2 TABLET (60mg ) BY MOUTH ONCE DAILY.     levofloxacin 500 MG tablet  Commonly known as:  LEVAQUIN  Take 1 tablet (500 mg total) by mouth every other day.     metoprolol 50 MG tablet  Commonly known as:  LOPRESSOR  TAKE 1 TABLET BY MOUTH TWICE DAILY.     oxybutynin 5 MG 24 hr tablet  Commonly known as:  DITROPAN-XL  Take 1 tablet (5 mg total) by mouth at bedtime.     pantoprazole 40 MG tablet  Commonly known as:  PROTONIX  TAKE ONE TABLET BY MOUTH ONCE DAILY.     PARoxetine 20 MG tablet  Commonly known as:  PAXIL  Take 20 mg by mouth every morning.     penicillin v potassium 250 MG tablet  Commonly known as:  VEETID  Take 250 mg by mouth daily.     predniSONE 10 MG tablet  Commonly known as:  DELTASONE  Take 6 tabs 4/15 then take 4 tabs for 3 days then take 2 tabs for 3 days for 1 tab for 3 days then stop.     albuterol 108 (90 BASE) MCG/ACT inhaler  Commonly known as:  PROVENTIL HFA;VENTOLIN HFA  Inhale 2 puffs into the lungs every 6 (six) hours as needed for wheezing or shortness of breath.     PROAIR HFA 108 (90 BASE) MCG/ACT inhaler  Generic drug:  albuterol  INHALE 2 PUFFS INTO LUNGS EVERY 6 HOURS AS NEEDED FOR WHEEZING.     simvastatin 20 MG tablet  Commonly known as:  ZOCOR  TAKE 1 TABLET BY MOUTH AT BEDTIME.     SYSTANE OP  Place 1 drop into both  eyes 2 (two) times daily.     TEKTURNA 150 MG tablet  Generic drug:  aliskiren  TAKE 1 TABLET BY MOUTH EACH MORNING.     TUSSIN 100 MG/5ML syrup  Generic drug:  guaifenesin  Take 200 mg by mouth every 6 (six) hours as needed for cough.       Allergies  Allergen Reactions  . Carbapenems Other (See Comments)    unknown  . Cephalosporins Other (See Comments)    unknown  . Amoxicillin Rash  . Penicillins Rash      The results of significant diagnostics from this hospitalization (including imaging, microbiology, ancillary and laboratory) are listed below for reference.    Significant Diagnostic Studies: Dg Chest Port 1 View  12/13/2013   CLINICAL  DATA:  Shortness of breath, hypertension, breast cancer, asthma, diabetes  EXAM: PORTABLE CHEST - 1 VIEW  COMPARISON:  Portable exam 1454 hr compared to 12/10/2013  FINDINGS: Enlargement of cardiac silhouette.  Pulmonary vascular congestion.  Tortuous aorta.  Calcified mediastinal lymph nodes with calcified right upper lobe granuloma.  Bronchitic changes with bibasilar atelectasis greater on left.  No acute infiltrate, pleural effusion or pneumothorax.  Bilateral glenohumeral degenerative changes.  IMPRESSION: Enlargement of cardiac silhouette with pulmonary vascular congestion.  Bronchitic and old granulomatous disease changes with bibasilar atelectasis greater on left.   Electronically Signed   By: Lavonia Dana M.D.   On: 12/13/2013 15:14   Dg Chest Portable 1 View  12/10/2013   CLINICAL DATA:  Short of breath  EXAM: PORTABLE CHEST - 1 VIEW  COMPARISON:  12/11/2011  FINDINGS: Mild atelectasis in the lung bases. Mild vascular congestion without edema or effusion. Heart size upper normal.  IMPRESSION: Mild vascular congestion and mild bibasilar atelectasis.   Electronically Signed   By: Franchot Gallo M.D.   On: 12/10/2013 09:41    Microbiology: No results found for this or any previous visit (from the past 240 hour(s)).   Labs: Basic  Metabolic Panel:  Recent Labs Lab 12/10/13 0928 12/11/13 0620 12/12/13 0622 12/13/13 0504 12/14/13 0501  NA 143 141 137 142 143  K 4.4 4.2 4.6 4.7 4.3  CL 103 100 96 100 102  CO2 27 25 25 28 29   GLUCOSE 148* 189* 175* 175* 130*  BUN 58* 66* 83* 86* 82*  CREATININE 1.89* 1.87* 1.97* 1.83* 1.79*  CALCIUM 9.5 9.5 9.2 8.9 8.7   Liver Function Tests:  Recent Labs Lab 12/10/13 0928  AST 19  ALT 11  ALKPHOS 83  BILITOT 0.4  PROT 7.9  ALBUMIN 3.8   No results found for this basename: LIPASE, AMYLASE,  in the last 168 hours No results found for this basename: AMMONIA,  in the last 168 hours CBC:  Recent Labs Lab 12/10/13 0928 12/11/13 0620 12/12/13 0622 12/13/13 0504  WBC 10.3 13.1* 13.8* 7.7  NEUTROABS 7.8*  --   --   --   HGB 10.2* 10.1* 10.2* 10.2*  HCT 32.4* 31.7* 32.6* 32.9*  MCV 92.6 91.9 93.1 92.9  PLT 226 236 273 245   Cardiac Enzymes:  Recent Labs Lab 12/10/13 0928  TROPONINI <0.30   BNP: BNP (last 3 results)  Recent Labs  12/10/13 0928  PROBNP 856.7*   CBG:  Recent Labs Lab 12/13/13 1143 12/13/13 1630 12/13/13 2031 12/14/13 0717 12/14/13 1128  GLUCAP 240* 148* 227* 118* 322*       Signed:  Lezlie Octave Reise Gladney  [facility] 12/14/2013, 12:27 PM

## 2013-12-14 NOTE — Progress Notes (Signed)
Patient discharged back to [facility], patient's daughter POA,at the bedside,discharge instructions given to daughter, who verbalized understanding. Vital signs stable.No c/o pain or discomfort noted. Accompanied by staff to an awaiting vehicle.

## 2013-12-14 NOTE — Clinical Social Work Placement (Signed)
    Clinical Social Work Department CLINICAL SOCIAL WORK PLACEMENT NOTE 12/14/2013  Patient:  [name], [name]  Account Number:  [id number] Admit date:  12/10/2013  Clinical Social Worker:  Edwyna Shell, CLINICAL SOCIAL WORKER  Date/time:  12/14/2013 12:30 PM  Clinical Social Work is seeking post-discharge placement for this patient at the following level of care:   [place]   (*CSW will update this form in Epic as items are completed)   12/14/2013  Patient/family provided with [place] Department of Clinical Social Work's list of facilities offering this level of care within the geographic area requested by the patient (or if unable, by the patient's family).  12/14/2013  Patient/family informed of their freedom to choose among providers that offer the needed level of care, that participate in Medicare, Medicaid or managed care program needed by the patient, have an available bed and are willing to accept the patient.  12/14/2013  Patient/family informed of [facility]' ownership interest in [facility], as well as of the fact that they are under no obligation to receive care at this facility.  PASARR submitted to EDS on 12/14/2013 PASARR number received from [employer] on 12/14/2013  FL2 transmitted to all facilities in geographic area requested by pt/family on  12/14/2013 FL2 transmitted to all facilities within larger geographic area on   Patient informed that his/her managed care company has contracts with or will negotiate with  certain facilities, including the following:     Patient/family informed of bed offers received:   Patient chooses bed at  Physician recommends and patient chooses bed at    Patient to be transferred to  on   Patient to be transferred to facility by   The following physician request were entered in Epic:   Additional Comments: Patient has preexisting PASARR, family requests only [facility].  Will return to [facility]  if [facility] does not accept.  Edwyna Shell, LCSW Clinical Social Worker [phone])

## 2013-12-14 NOTE — Clinical Social Work Note (Signed)
Pt worked with PT again this afternoon. Recommendation is for home health. [name], Fraser Din was present and is agreeable to return to [facility] unless authorization is received prior for SNF. No call yet from [facility]. [facility] was able to offer bed. Fraser Din has decided not to pursue private pay placement. Notified [facility] of return today. CM to arrange home health PT. D/C summary to be faxed upon completion. Daughter plans to provide transport.  Benay Pike, [place]

## 2013-12-14 NOTE — Discharge Summary (Signed)
Patient seen and examined.  Agree with note as above per [name], NP.  Patient was admitted to the hospital with shortness of breath. This is felt to be multifactorial due to COPD exacerbation as well as acute on chronic diastolic congestive heart failure. Patient was started on intravenous Lasix, IV steroids and bronchodilators. Her condition slowly improved. She was able to wean off oxygen. Steroids have been changed a prednisone taper. She is really been restarted on her home dose of oral Lasix. She's been advised to limit the amount of fluid she drinks. There was concern for possible aspiration. She had a repeat swallow evaluation which did not show any frank aspiration, but there was flash penetration when patient was eating too quickly which elicited a strong cough reflex. She was advised to take slow bites. The patient was evaluated by physical therapy and felt appropriate to return to assisted living. Her creatinine does appear to be mildly elevated from baseline at 1. 79. This should be rechecked in one week. [name] is currently on hold due to renal dysfunction. This can be resumed if her creatinine is improved on recheck. The patient is stable for discharge.  [name]

## 2013-12-14 NOTE — Procedures (Signed)
Objective Swallowing Evaluation: Modified Barium Swallowing Study   Patient Details  Name: [name] MRN: [id number] Date of Birth: 01/31/1922  Today's Date: 12/14/2013 Time: 4034-7425 SLP Time Calculation (min): 34 min  Past Medical History:  Past Medical History  Diagnosis Date  . Breast cancer   . Hypertension   . Depression   . GERD (gastroesophageal reflux disease)   . Hypercholesteremia   . CKD (chronic kidney disease)   . Pneumonia 12/12/2011  . UTI (lower urinary tract infection) 12/12/2011  . Chronic kidney disease   . Shortness of breath   . Arthritis   . Asthma   . Reflux   . Venous stasis   . Diabetes mellitus without complication     Type 2   Past Surgical History:  Past Surgical History  Procedure Laterality Date  . Replacement total knee    . Mastectomy    . Total hip arthroplasty    . Carotid endarterectomy    . Cataracts     HPI:  [name] is a 80 y.o. female with a past medical history that includes asthma, hypertension, chronic kidney disease stage III, diabetes presents to the emergency department from a nursing facility with chief complaint of worsening shortness of breath. Information is obtained from the daughter who is at the bedside. Daughter reports that she noticed some mild increased work of breathing with exertion and wheezing when the patient was visiting for the Easter holiday. Since that time the shortness of breath and wheezing has worsened. Associated symptoms include productive cough with thick white sputum. This morning the staff of the facility noted obvious increased work of breathing about patient and called EMS. EMS reports oxygen saturation level at that time was 92% on room air. She was given to do a nebs at the facility and one albuterol nebulizer by EMS en route. There is no report of recent fever chills nausea vomiting diarrhea. No complaints of chest pain palpitations headache dizziness syncope or near-syncope.  Initial evaluation included a chest x-ray revealing mild vascular congestion with mild by basilar atelectasis.      Assessment / Plan / Recommendation Clinical Impression  Dysphagia Diagnosis: Mild pharyngeal phase dysphagia;Suspected primary esophageal dysphagia Clinical impression: Pt presents with mild pharyngeal phase dysphagia and suspected primary esophageal phase dysphagia characterized by delay in swallow initiation and decreased airway protection with liquids resulting in gross aspiration of nectars by teaspoon before the swallow triggered which triggered strong coughing episode and periods of pt inability to catch her breath (pt silent and then gasping). Aspirate was removed, however coughing persisted. Pt demonstrated improved control with self presented cup sips of thin liquids (no penetration or aspiration), however she needs verbal cues to take single sips only. Esophageal sweep revealed barium/food filled esophagus with poor clearance of bolus. During coughing episodes, bolus was forced back through LES into cervical esophagus. Pharyngeal strength appears WFL, however safe swallow function is negatively impacted by respiratory issues (COPD), esophageal motility, and cognition (pt attempts to self feed immediately after choking episodes). Study was reviewed at length with pt's daughter, Fraser Din. Recommend mechanical soft/D3 and thin liquids by cup. NO straws. Single sips only.     Treatment Recommendation  Defer treatment plan to SLP at (Comment) [facility])    Diet Recommendation Dysphagia 3 (Mechanical Soft);Thin liquid   Liquid Administration via: Cup;No straw Medication Administration: Whole meds with puree Supervision: Full supervision/cueing for compensatory strategies Compensations: Slow rate;Small sips/bites Postural Changes and/or Swallow Maneuvers: Out of bed  for meals;Seated upright 90 degrees;Upright 30-60 min after meal    Other  Recommendations Oral Care  Recommendations: Oral care BID Other Recommendations: Clarify dietary restrictions   Follow Up Recommendations  Home health SLP    Frequency and Duration min 2x/week  2 weeks          General Date of Onset: 12/10/13 HPI: [name] is a 80 y.o. female with a past medical history that includes asthma, hypertension, chronic kidney disease stage III, diabetes presents to the emergency department from a nursing facility with chief complaint of worsening shortness of breath. Information is obtained from the daughter who is at the bedside. Daughter reports that she noticed some mild increased work of breathing with exertion and wheezing when the patient was visiting for the Easter holiday. Since that time the shortness of breath and wheezing has worsened. Associated symptoms include productive cough with thick white sputum. This morning the staff of the facility noted obvious increased work of breathing about patient and called EMS. EMS reports oxygen saturation level at that time was 92% on room air. She was given to do a nebs at the facility and one albuterol nebulizer by EMS en route. There is no report of recent fever chills nausea vomiting diarrhea. No complaints of chest pain palpitations headache dizziness syncope or near-syncope. Initial evaluation included a chest x-ray revealing mild vascular congestion with mild by basilar atelectasis.  Type of Study: Modified Barium Swallowing Study Reason for Referral: Objectively evaluate swallowing function Previous Swallow Assessment: February 2015 Diet Prior to this Study: Dysphagia 3 (soft);Thin liquids Temperature Spikes Noted: No Respiratory Status: Nasal cannula History of Recent Intubation: Yes Behavior/Cognition: Alert;Cooperative;Requires cueing Oral Motor / Sensory Function: Within functional limits Self-Feeding Abilities: Able to feed self Patient Positioning: Upright in chair Baseline Vocal Quality: Clear Volitional Cough:  Strong Volitional Swallow: Able to elicit Anatomy: Within functional limits Pharyngeal Secretions: Not observed secondary MBS    Reason for Referral Objectively evaluate swallowing function   Oral Phase Oral Preparation/Oral Phase Oral Phase: WFL   Pharyngeal Phase Pharyngeal Phase Pharyngeal Phase: Impaired Pharyngeal - Nectar Pharyngeal - Nectar Teaspoon: Delayed swallow initiation;Premature spillage to valleculae;Premature spillage to pyriform sinuses;Penetration/Aspiration before swallow;Moderate aspiration;Reduced airway/laryngeal closure;Inter-arytenoid space residue Penetration/Aspiration details (nectar teaspoon): Material enters airway, passes BELOW cords then ejected out Pharyngeal - Nectar Cup: Delayed swallow initiation;Premature spillage to valleculae;Premature spillage to pyriform sinuses;Penetration/Aspiration during swallow;Reduced airway/laryngeal closure Penetration/Aspiration details (nectar cup): Material enters airway, CONTACTS cords then ejected out Pharyngeal - Thin Pharyngeal - Thin Teaspoon: Delayed swallow initiation;Premature spillage to valleculae;Reduced airway/laryngeal closure;Penetration/Aspiration before swallow;Trace aspiration Penetration/Aspiration details (thin teaspoon): Material enters airway, CONTACTS cords then ejected out Pharyngeal - Thin Cup: Delayed swallow initiation;Premature spillage to valleculae Pharyngeal - Thin Straw: Delayed swallow initiation;Premature spillage to valleculae;Premature spillage to pyriform sinuses;Penetration/Aspiration before swallow;Penetration/Aspiration during swallow;Reduced airway/laryngeal closure Penetration/Aspiration details (thin straw): Material enters airway, CONTACTS cords then ejected out Pharyngeal - Solids Pharyngeal - Puree: Within functional limits Pharyngeal - Regular: Within functional limits Pharyngeal - Pill: Delayed swallow initiation;Premature spillage to valleculae Pharyngeal Phase -  Comment Pharyngeal Comment: negatively impacted by delay in initiation  Cervical Esophageal Phase    GO    Cervical Esophageal Phase Cervical Esophageal Phase: Impaired Cervical Esophageal Phase - Thin Thin Straw: Prominent cricopharyngeal segment        Thank you,  Genene Churn, CCC-SLP [phone]  Ephraim Hamburger 12/14/2013, 4:20 PM

## 2013-12-14 NOTE — Plan of Care (Signed)
Problem: Discharge Progression Outcomes Goal: Independent ADLs or Home Health Care Outcome: Adequate for Discharge Discharged to assisted living.

## 2013-12-15 ENCOUNTER — Inpatient Hospital Stay
Admission: RE | Admit: 2013-12-15 | Discharge: 2014-04-14 | Disposition: A | Discharge status: Still In Hospital | Payer: Medicare Other | Source: Ambulatory Visit | Attending: Internal Medicine | Admitting: Internal Medicine

## 2013-12-15 DIAGNOSIS — R062 Wheezing: Secondary | ICD-10-CM

## 2013-12-15 DIAGNOSIS — R609 Edema, unspecified: Secondary | ICD-10-CM

## 2013-12-15 DIAGNOSIS — M25559 Pain in unspecified hip: Secondary | ICD-10-CM

## 2013-12-15 DIAGNOSIS — M545 Low back pain, unspecified: Secondary | ICD-10-CM

## 2013-12-15 DIAGNOSIS — L539 Erythematous condition, unspecified: Principal | ICD-10-CM

## 2013-12-15 DIAGNOSIS — R0689 Other abnormalities of breathing: Secondary | ICD-10-CM

## 2013-12-15 LAB — GLUCOSE, CAPILLARY: GLUCOSE-CAPILLARY: 155 mg/dL — AB (ref 70–99)

## 2013-12-16 LAB — GLUCOSE, CAPILLARY: Glucose-Capillary: 152 mg/dL — ABNORMAL HIGH (ref 70–99)

## 2013-12-17 ENCOUNTER — Non-Acute Institutional Stay (SKILLED_NURSING_FACILITY): Payer: Medicare Other | Admitting: Internal Medicine

## 2013-12-17 ENCOUNTER — Encounter: Payer: Self-pay | Admitting: Internal Medicine

## 2013-12-17 DIAGNOSIS — I509 Heart failure, unspecified: Secondary | ICD-10-CM

## 2013-12-17 DIAGNOSIS — I5033 Acute on chronic diastolic (congestive) heart failure: Secondary | ICD-10-CM

## 2013-12-17 DIAGNOSIS — N179 Acute kidney failure, unspecified: Secondary | ICD-10-CM

## 2013-12-17 DIAGNOSIS — N189 Chronic kidney disease, unspecified: Secondary | ICD-10-CM

## 2013-12-17 DIAGNOSIS — E119 Type 2 diabetes mellitus without complications: Secondary | ICD-10-CM

## 2013-12-17 DIAGNOSIS — J441 Chronic obstructive pulmonary disease with (acute) exacerbation: Secondary | ICD-10-CM

## 2013-12-17 LAB — GLUCOSE, CAPILLARY
GLUCOSE-CAPILLARY: 157 mg/dL — AB (ref 70–99)
GLUCOSE-CAPILLARY: 320 mg/dL — AB (ref 70–99)
Glucose-Capillary: 282 mg/dL — ABNORMAL HIGH (ref 70–99)
Glucose-Capillary: 174 mg/dL — ABNORMAL HIGH (ref 70–99)
Glucose-Capillary: 240 mg/dL — ABNORMAL HIGH (ref 70–99)

## 2013-12-17 NOTE — Progress Notes (Signed)
Patient ID: [name], female   DOB: Aug 18, 1922, 80 y.o.   MRN: [id number]   This is an acute visit.  Level care skilled.  Facility [facility].  Chief complaint.  Acute visit status post hospitalization for acute respiratory failure felt multifactoral with history of COPD and CHF.  History of present illness.  Patient is a pleasant 80 year old female who is here for rehabilitation after hospitalization for acute respiratory failure felt multifactoral because of CHF and COPD-she was treated with nebulizers and Lasix and this apparently resolved.  She continues on a prednisone taper as well as bronchodilators and is receiving Levaquin for 3 more doses.  Cardiac echo did show mitral stenosis as well as elevated pulmonary artery pressures.   suggestion for outpatient cardiology followup.  Patient also with a history of silent aspiration that apparently progressed.  Recommendations for dysphasia 3 mechanical soft thin liquid diet.  Currently she has no complaints she is resting comfortably in her wheelchair she does have a listed history of diabetes type 2 on no medication however blood sugar this noon  elevated at 258 it was in the mid 100s this morning  Family medical social history reviewed per discharge note on 12/14/2013.  Medications have been reviewed per MAR.  Review of systems.  General no complaints of fever chills.  Respiratory he does not complaining of cough does have a history of COPD does not complaining of shortness of breath.  Cardiac no chest pain appears to have some mild lower extremity edema.  GI -- no complaints of nausea vomiting diarrhea constipation or abdominal pain.  Muscle skeletal is not complaining of any joint pain although she appears to have some weakness here.  Neurologic no complaints of headache or dizziness or syncopal-type feelings.  Physical exam.  Temperature is 98.2 pulse 54 respirations 20 blood pressure 144/74 O2 saturation is  in the 90s.  In general this is a frail elderly female in no distress sitting comfortably in her wheelchair her skin is warm and dry.  Chest is clear to auscultation with shallow air entry no labored breathing.  Heart is regular rate and rhythm without murmur gallop or rub.  Abdomen obese soft nontender with positive bowel sounds.  Extremities moves all extremities x4-I do not note any deformities.  She does have trace lower extremity edema bilaterally with reduced pedal pulses bilaterally.  Psych she is oriented to self month and year and can tell me where she lived-however she cannot tell me the day of the week  Labs.  12/17/2013.  WBC 8.3 hemoglobin 10.8 platelets 225.  Sodium 143 potassium 4.3 BUN 63 creatinine 1.8 to.  Assessment and plan.  #1-history of acute respiratory failure this appears to be resolved-.  #2-history CHF-she is on Lasix 60 mg a day this is somewhat complicated with her history of renal failure-we'll have to update her metabolic panel first laboratory day next week. Also monitor her weights daily notify provider gain greater than 3 pounds.   #2 history of COPD with exasperation she is completing a course of Levaquin clinically this appears to be stable--. Also is on nebulizers as well as prednisone taper  #3 history of type 2 diabetes she is not on any medication however blood sugars appear to be somewhat elevated will start her on a loose sliding scale starting at 200 with meals no at bedtime coverage call provider if greater than 300.--I suspect the prednisone could be elevating her blood sugars  #4-history of hypertension she is on metoprolol-at  this point will monitor systolics appear to be mildly elevated but we do not have many readings yet--she had been on to [place] --this was held secondary to her renal issues  #5-history of dyslipidemia she continues on statin since I suspect here stay here to be relatively short we'll defer to primary care  provider for close followup of this.  History apparently of gout she is on allopurinol at this point this does not appear to be symptomatic.  #7 as history of depression she is on Paxil Will monitor.  #8  history dysphasia diet changes as noted above recommendation for mechanical soft D3 and thin liquids by cup no strawss single sips only   [id number]    -  .

## 2013-12-18 LAB — GLUCOSE, CAPILLARY
Glucose-Capillary: 115 mg/dL — ABNORMAL HIGH (ref 70–99)
Glucose-Capillary: 205 mg/dL — ABNORMAL HIGH (ref 70–99)

## 2013-12-19 LAB — GLUCOSE, CAPILLARY
GLUCOSE-CAPILLARY: 97 mg/dL (ref 70–99)
GLUCOSE-CAPILLARY: 213 mg/dL — AB (ref 70–99)
Glucose-Capillary: 240 mg/dL — ABNORMAL HIGH (ref 70–99)
Glucose-Capillary: 190 mg/dL — ABNORMAL HIGH (ref 70–99)

## 2013-12-20 ENCOUNTER — Non-Acute Institutional Stay (SKILLED_NURSING_FACILITY): Payer: Medicare Other | Admitting: Internal Medicine

## 2013-12-20 DIAGNOSIS — N179 Acute kidney failure, unspecified: Secondary | ICD-10-CM

## 2013-12-20 DIAGNOSIS — I5033 Acute on chronic diastolic (congestive) heart failure: Secondary | ICD-10-CM

## 2013-12-20 DIAGNOSIS — N189 Chronic kidney disease, unspecified: Secondary | ICD-10-CM

## 2013-12-20 DIAGNOSIS — J441 Chronic obstructive pulmonary disease with (acute) exacerbation: Secondary | ICD-10-CM

## 2013-12-20 DIAGNOSIS — I509 Heart failure, unspecified: Secondary | ICD-10-CM

## 2013-12-20 LAB — GLUCOSE, CAPILLARY
GLUCOSE-CAPILLARY: 104 mg/dL — AB (ref 70–99)
GLUCOSE-CAPILLARY: 153 mg/dL — AB (ref 70–99)
Glucose-Capillary: 214 mg/dL — ABNORMAL HIGH (ref 70–99)
Glucose-Capillary: 199 mg/dL — ABNORMAL HIGH (ref 70–99)

## 2013-12-20 NOTE — Progress Notes (Signed)
UR chart review completed.  

## 2013-12-21 LAB — GLUCOSE, CAPILLARY
Glucose-Capillary: 121 mg/dL — ABNORMAL HIGH (ref 70–99)
Glucose-Capillary: 333 mg/dL — ABNORMAL HIGH (ref 70–99)
Glucose-Capillary: 187 mg/dL — ABNORMAL HIGH (ref 70–99)
Glucose-Capillary: 202 mg/dL — ABNORMAL HIGH (ref 70–99)

## 2013-12-21 NOTE — Progress Notes (Addendum)
Patient ID: [name], female   DOB: [birth year], 80 y.o.   MRN: [id number]                   HISTORY & PHYSICAL  DATE:  12/20/2013    FACILITY: [facility]    LEVEL OF CARE:   SNF   CHIEF COMPLAINT:  Admission to SNF, post stay at [facility], 12/10/2013 through 12/14/2013.    HISTORY OF PRESENT ILLNESS:  This patient was admitted to hospital with progressive shortness of breath over several weeks.  This was gradually progressive.  She had a cough with thick, white sputum.  On the morning of admission, the staff at [facility] assisted living noted increased work of breathing.  EMS reported oxygen saturation at 92% on room air.  She was given DuoNebs at the facility and one Albuterol.  Pro-BNP on arrival was 849.  Basic metabolic panel showed a creatinine of 1.89, and 1.79 at discharge.    The patient had an echocardiogram that showed an EF of 65-70%.  She had moderate mitral stenosis.  Right ventricular cavity size was mildly dilated.  Pulmonary artery pressure was 64.    The patient was given nebulizers and Lasix.  Her  respiratory failure resolved.  This was felt to be an exacerbation of COPD as well as acute-on-chronic diastolic heart failure.  She was given IV Lasix.    She was noted to have acute-on-chronic renal failure which is stage III.    PAST MEDICAL HISTORY/PROBLEM LIST:  Includes:    Osteoarthritis.    Lower extremity weakness.    UTI.    Community-acquired pneumonia.    History of breast cancer, status post left mastectomy.    Hypertension.     Type 2 diabetes.     Chronic asthma.    Carpal tunnel syndrome.    Stress urinary incontinence.    Dysphagia.  The patient had a swallowing study.  She was recommended for a dysphagia III and recommended to be out of bed for all meals, sitting upright at 90 degrees.    Acute-on-chronic diastolic heart failure.    COPD.    Chronic renal failure.    CURRENT MEDICATIONS:  Discharge  medications include:      Allopurinol 100 mg two times a day, presumably gout.     Enteric-coated aspirin 81 q.d.    Cranberry 475, 1 capsule every 12 hours.    Benadryl 25 q.6.    Fenofibrate 54 q.d.    Lasix 60 mg by mouth once a day (this may actually be a reduction in her previous Lasix dose).    Levaquin 500 mg a day.    Metoprolol 50 mg twice a day.           Ditropan XL 5 mg at bedtime.    Protonix 40 mg a day (?GERD).    SOCIAL HISTORY:   HOUSING:  The patient tells me that she lived at [facility] assisted living.  She has been in this building before, although I do not see her records.    FUNCTIONAL STATUS:   She used a walker and was able to get herself down to the cafeteria.    FAMILY HISTORY:  None related by the patient.    REVIEW OF SYSTEMS:   CHEST/RESPIRATORY:  She had some coughing yesterday, but does not feel excessively short of breath.   CARDIAC:   No chest pain or palpitations.  States she has some edema.  GI:  No abdominal pain.   No diarrhea.   GU:  No dysuria.    PHYSICAL EXAMINATION:   VITAL SIGNS:   O2 SATURATIONS:  97% on room air.   PULSE:   75.   RESPIRATIONS:  18 and unlabored.   CHEST/RESPIRATORY:  Surprisingly clear air entry bilaterally.  Mildly reduced air entry.  However, work of breathing is normal.  There is no accessory muscle use.   CARDIOVASCULAR:  CARDIAC:   Short midsystolic murmur.  I did not hear classic mitral stenosis findings, although the patient was not positioned properly while in bed.  She has borderline JVP at 45 degrees.   GASTROINTESTINAL:  ABDOMEN:   Slightly distended.  No masses.   LIVER/SPLEEN/KIDNEYS:  No liver, no spleen.   GENITOURINARY:  BLADDER:   Not enlarged.  There is no costovertebral angle tenderness.    CIRCULATION:   EDEMA/VARICOSITIES:  She has edema of the lower legs, left greater than right, but no evidence of a DVT.   MUSCULOSKELETAL:   EXTREMITIES:   BILATERAL LOWER EXTREMITIES:   Significant osteoarthritis of both knees.  However, the joints themselves seem stable.   NEUROLOGICAL:    SENSATION/STRENGTH:  She has 4/5 hip flexor and hip abduction.   PSYCHIATRIC:   MENTAL STATUS:   She is hard of hearing.  Given that, I think she does quite well.  Really seems to be able to give a coherent account of things.    ASSESSMENT/PLAN:  Exacerbation of COPD.  Secondary to respiratory and possibly cardiac issues.  This seems very stable at the moment.  She will need routine inhalers.  She is listed as having COPD.    Diastolic heart failure.  This may be related to mitral stenosis, if I am reading things correctly.   She also has pulmonary hypertension.  Lasix at 60 mg a day.  This may actually be a reduction, according to notes I am seeing.  She has always been on it b.i.d.  Her weights will need to be followed carefully.  Currently, her weight is 214 pounds.    Mitral stenosis.    Pulmonary hypertension.    Urinary frequency.  On Ditropan.    On every-other-day Levaquin.   Apparently, she has been taking this for many months.  I really know of no value of this, although I would like to attempt to research this a little more, perhaps discuss with her family.    Gastroesophageal reflux disease.  On Protonix.    She is listed as having diabetes, although not currently on any medications.  Hemoglobin A1c on 12/10/2013 was 6.5.    Chronic renal insufficiency.  Her BUN of 40 and creatinine of 1.57 today is actually stable.  Hemoglobin at 10.8, presumably chronic renal insufficiency.    In spite of a multitude of medical issues, the patient appears to be currently stable.    I am going to leave her Lasix at 60 mg, follow her weights.  It is quite possible that she will need an increase in dose or increase of diuretic frequency.  Right now, her respiratory status seems stable.    She has lower extremity weakness and will require aggressive physical therapy.

## 2013-12-22 ENCOUNTER — Non-Acute Institutional Stay (SKILLED_NURSING_FACILITY): Payer: Medicare Other | Admitting: Internal Medicine

## 2013-12-22 DIAGNOSIS — R269 Unspecified abnormalities of gait and mobility: Secondary | ICD-10-CM

## 2013-12-22 DIAGNOSIS — J441 Chronic obstructive pulmonary disease with (acute) exacerbation: Secondary | ICD-10-CM

## 2013-12-22 DIAGNOSIS — I5033 Acute on chronic diastolic (congestive) heart failure: Secondary | ICD-10-CM

## 2013-12-22 DIAGNOSIS — I509 Heart failure, unspecified: Secondary | ICD-10-CM

## 2013-12-22 LAB — GLUCOSE, CAPILLARY
GLUCOSE-CAPILLARY: 246 mg/dL — AB (ref 70–99)
Glucose-Capillary: 110 mg/dL — ABNORMAL HIGH (ref 70–99)
Glucose-Capillary: 148 mg/dL — ABNORMAL HIGH (ref 70–99)
Glucose-Capillary: 167 mg/dL — ABNORMAL HIGH (ref 70–99)

## 2013-12-22 NOTE — Progress Notes (Signed)
Patient ID: [name], female   DOB: 1921/09/08, 80 y.o.   MRN: [id number] Facility; [facility] Chief complaint review of admission from 2 days ago History this is a 80 year old woman that I admitted to the facility 2 days ago. She has a list of serious medical concerns including COPD acute, diastolically mediated congestive heart failure with a history of mitral stenosis and pulmonary hypertension with a PA pressure of 64, chronic renal failure stage III. Was discharged on an aggressive respiratory regimen, Lasix 60 mg a day. Her admission weight is 214.4 pounds, yesterday she was 213.9 pounds suggesting stability. There appears to be some variability in her blood pressure measurements although yesterday was 160/64 she has had some low measurements perhaps when she is upright. She probably should be checked for orthostasis  Past Medical History  Diagnosis Date  . Breast cancer   . Hypertension   . Depression   . GERD (gastroesophageal reflux disease)   . Hypercholesteremia   . CKD (chronic kidney disease)   . Pneumonia 12/12/2011  . UTI (lower urinary tract infection) 12/12/2011  . Chronic kidney disease   . Shortness of breath   . Arthritis   . Asthma   . Reflux   . Venous stasis   . Diabetes mellitus without complication     Type 2   Past Surgical History  Procedure Laterality Date  . Replacement total knee    . Mastectomy    . Total hip arthroplasty    . Carotid endarterectomy    . Cataracts     Current Outpatient Prescriptions on File Prior to Visit  Medication Sig Dispense Refill  . acetaminophen-codeine (TYLENOL #3) 300-30 MG per tablet Take 1 tablet by mouth every 6 (six) hours as needed for moderate pain.      Marland Kitchen albuterol (PROVENTIL HFA;VENTOLIN HFA) 108 (90 BASE) MCG/ACT inhaler Inhale 2 puffs into the lungs every 6 (six) hours as needed for wheezing or shortness of breath.      . allopurinol (ZYLOPRIM) 100 MG tablet TAKE (2) TABLETS BY MOUTH ONCE DAILY.  60 tablet   5  . ASPIRIN LOW DOSE 81 MG EC tablet TAKE 1 TABLET BY MOUTH ONCE DAILY.  30 tablet  0  . Cranberry 475 MG CAPS Take 1 capsule by mouth every 12 (twelve) hours.      . diphenhydrAMINE (BENADRYL) 25 MG tablet Take 25 mg by mouth every 6 (six) hours as needed for itching.      . fenofibrate 54 MG tablet Take 1 tablet (54 mg total) by mouth daily.  30 tablet  0  . furosemide (LASIX) 40 MG tablet TAKE 1 & 1/2 TABLET (60mg ) BY MOUTH ONCE DAILY.  45 tablet  5  . guaifenesin (TUSSIN) 100 MG/5ML syrup Take 200 mg by mouth every 6 (six) hours as needed for cough.      Marland Kitchen levofloxacin (LEVAQUIN) 500 MG tablet Take 1 tablet (500 mg total) by mouth every other day.  3 tablet  0  . metoprolol (LOPRESSOR) 50 MG tablet TAKE 1 TABLET BY MOUTH TWICE DAILY.  60 tablet  5  . oxybutynin (DITROPAN-XL) 5 MG 24 hr tablet Take 1 tablet (5 mg total) by mouth at bedtime.  30 tablet  0  . pantoprazole (PROTONIX) 40 MG tablet TAKE ONE TABLET BY MOUTH ONCE DAILY.  30 tablet  5  . PARoxetine (PAXIL) 20 MG tablet Take 20 mg by mouth every morning.      . penicillin v potassium (  VEETID) 250 MG tablet Take 250 mg by mouth daily.      Vladimir Faster Glycol-Propyl Glycol (SYSTANE OP) Place 1 drop into both eyes 2 (two) times daily.      . predniSONE (DELTASONE) 10 MG tablet Take 6 tabs 4/15 then take 4 tabs for 3 days then take 2 tabs for 3 days for 1 tab for 3 days then stop.  21 tablet  0  . PROAIR HFA 108 (90 BASE) MCG/ACT inhaler INHALE 2 PUFFS INTO LUNGS EVERY 6 HOURS AS NEEDED FOR WHEEZING.  8.5 g  0  . simvastatin (ZOCOR) 20 MG tablet TAKE 1 TABLET BY MOUTH AT BEDTIME.  30 tablet  5  . Spacer/Aero-Holding Chambers (AEROCHAMBER MV) inhaler Use as instructed. 2 puffs BID  1 each  0  . TEKTURNA 150 MG tablet TAKE 1 TABLET BY MOUTH EACH MORNING.  30 tablet  5   No current facility-administered medications on file prior to visit.    Social; patient was living at [facility] assisted living using a walker  Review of  systems Respiratory she is not complaining of shortness of breath Cardiac no chest pain GI no nausea or vomiting  Physical examination Gen. the patient looks comfortable off any oxygen. Vital signs blood pressure 160/65 respirations 19 pulse 89 temperature 97.6 O2 sat was 100% Respiratory remarkably clear entry bilaterally Cardiac there is a systolic ejection murmur I do not hear a diastolic rumble JVP is not elevated there is no S3 Extremities; I don't see major amounts of edema here. Gait she is able to get her self up to a sitting position with some difficulty. Her gait is wide-based and unsteady. Bilateral Trendelenburg gait  Impression/plan #1 COPD acute exacerbation this seems remarkably stable as did her her O2 sats on room #2 diastolic heart failure there is no evidence of this at the bedside. Her echocardiogram shows mitral stenosis however I don't appreciate this at the bedside. She has pulmonary hypertension. Her weights are stable on Lasix 60 mg a day. She may have been on more Lasix at home. This will need to be carefully monitored #3 gait ataxia. This is probably secondary to osteoarthritis and disuse she is very unsteady on her feet. Tends to look for objects to hold on to like her bedside table. #4on 2 nsaids on arrival both of them have been stopped Medication confusion apparently the patient actually discharged to [facility] from the hospital before returning here. She was on a higher dose of Lasix before she went to the hospital although she appears to be fairly stable at the moment I am not going to adjust this

## 2013-12-23 LAB — GLUCOSE, CAPILLARY
Glucose-Capillary: 137 mg/dL — ABNORMAL HIGH (ref 70–99)
Glucose-Capillary: 192 mg/dL — ABNORMAL HIGH (ref 70–99)
Glucose-Capillary: 218 mg/dL — ABNORMAL HIGH (ref 70–99)
Glucose-Capillary: 222 mg/dL — ABNORMAL HIGH (ref 70–99)

## 2013-12-24 LAB — GLUCOSE, CAPILLARY
GLUCOSE-CAPILLARY: 113 mg/dL — AB (ref 70–99)
Glucose-Capillary: 107 mg/dL — ABNORMAL HIGH (ref 70–99)
Glucose-Capillary: 166 mg/dL — ABNORMAL HIGH (ref 70–99)
Glucose-Capillary: 169 mg/dL — ABNORMAL HIGH (ref 70–99)

## 2013-12-25 LAB — GLUCOSE, CAPILLARY
GLUCOSE-CAPILLARY: 231 mg/dL — AB (ref 70–99)
Glucose-Capillary: 114 mg/dL — ABNORMAL HIGH (ref 70–99)
Glucose-Capillary: 143 mg/dL — ABNORMAL HIGH (ref 70–99)
Glucose-Capillary: 170 mg/dL — ABNORMAL HIGH (ref 70–99)

## 2013-12-26 LAB — GLUCOSE, CAPILLARY
GLUCOSE-CAPILLARY: 133 mg/dL — AB (ref 70–99)
GLUCOSE-CAPILLARY: 134 mg/dL — AB (ref 70–99)
Glucose-Capillary: 160 mg/dL — ABNORMAL HIGH (ref 70–99)
Glucose-Capillary: 198 mg/dL — ABNORMAL HIGH (ref 70–99)

## 2013-12-27 ENCOUNTER — Non-Acute Institutional Stay (SKILLED_NURSING_FACILITY): Payer: Medicare Other | Admitting: Internal Medicine

## 2013-12-27 DIAGNOSIS — N179 Acute kidney failure, unspecified: Secondary | ICD-10-CM

## 2013-12-27 DIAGNOSIS — J441 Chronic obstructive pulmonary disease with (acute) exacerbation: Secondary | ICD-10-CM

## 2013-12-27 DIAGNOSIS — N189 Chronic kidney disease, unspecified: Secondary | ICD-10-CM

## 2013-12-27 DIAGNOSIS — I5033 Acute on chronic diastolic (congestive) heart failure: Secondary | ICD-10-CM

## 2013-12-27 DIAGNOSIS — I509 Heart failure, unspecified: Secondary | ICD-10-CM

## 2013-12-27 LAB — GLUCOSE, CAPILLARY
Glucose-Capillary: 136 mg/dL — ABNORMAL HIGH (ref 70–99)
Glucose-Capillary: 171 mg/dL — ABNORMAL HIGH (ref 70–99)
Glucose-Capillary: 150 mg/dL — ABNORMAL HIGH (ref 70–99)

## 2013-12-27 NOTE — Progress Notes (Signed)
Patient ID: [name], female   DOB: [birth year], 80 y.o.   MRN: [id number] Facility; [facility] Chief complaint; followup medical issues including congestive heart failure, COPD.  Mrs. Piper rescue was admitted to the facility week ago. She had been in the hospital with COPD acute and I think was actually returned to assisted living before being transferred back to the hospital and then here. She also has diastolically mediated congestive heart failure with a history of mitral stenosis and pulmonary hypertension finally she has chronic renal failure. Her weight has been stable. She does not complain of shortness of breath. Her daughter was present has been concerned about some episodic abdominal breathing that she seems to do when they're in the room described as her "abdomen moving in and out forcibly"   has a past medical history of Breast cancer; Hypertension; Depression; GERD (gastroesophageal reflux disease); Hypercholesteremia; CKD (chronic kidney disease); Pneumonia (12/12/2011); UTI (lower urinary tract infection) (12/12/2011); Chronic kidney disease; Shortness of breath; Arthritis; Asthma; Reflux; Venous stasis; and Diabetes mellitus without complication.  Past Surgical History  Procedure Laterality Date  . Replacement total knee    . Mastectomy    . Total hip arthroplasty    . Carotid endarterectomy    . Cataracts     Review of systems Gen. there are no real concerns raised about this lady her weight is stable at roughly 213 pounds. Vitals; O2 sat is 95% on room air pulse rate 58 respirations 18 and unlabored Respiratory; patient does not describe shortness of breath or cough Cardiac no chest pain Abdomen no abdominal pain   Physical examination Gen. the patient is not in any distress Cardiac heart sounds are normal there is no evidence of congestive heart failure Respiratory fairly clear entry bilaterally no crackles or wheezes Abdomen no liver no spleen no tenderness GU  no dysuria.  Impression/plan #1 COPD I note that this does not show up on her problem list. Would probably be beneficial to her at some point to have pulmonary function tests although she is 80 years old #2 diastolically mediated heart failure this seems to be very stable #3 chronic renal failure lab work from today shows a BUN of 28 and a creatinine of 1.28 this is stable to improved #4 type 2 diabetes on no oral agents blood sugars appear to be stable.  Once again the patient is on long-standing antibiotics including Levaquin and penicillin I need to research this further. Family is really quite desirable for her to remain in this facility all talked to social services about this. [facility] does not seem to be able to get herself out of bed and I doubt she could transfer independently although I have not talked to therapy

## 2013-12-28 LAB — GLUCOSE, CAPILLARY
GLUCOSE-CAPILLARY: 192 mg/dL — AB (ref 70–99)
GLUCOSE-CAPILLARY: 124 mg/dL — AB (ref 70–99)
GLUCOSE-CAPILLARY: 147 mg/dL — AB (ref 70–99)
Glucose-Capillary: 150 mg/dL — ABNORMAL HIGH (ref 70–99)

## 2013-12-29 LAB — GLUCOSE, CAPILLARY
Glucose-Capillary: 153 mg/dL — ABNORMAL HIGH (ref 70–99)
Glucose-Capillary: 183 mg/dL — ABNORMAL HIGH (ref 70–99)
Glucose-Capillary: 174 mg/dL — ABNORMAL HIGH (ref 70–99)

## 2013-12-30 LAB — GLUCOSE, CAPILLARY
Glucose-Capillary: 161 mg/dL — ABNORMAL HIGH (ref 70–99)
Glucose-Capillary: 167 mg/dL — ABNORMAL HIGH (ref 70–99)
Glucose-Capillary: 220 mg/dL — ABNORMAL HIGH (ref 70–99)
Glucose-Capillary: 129 mg/dL — ABNORMAL HIGH (ref 70–99)
Glucose-Capillary: 272 mg/dL — ABNORMAL HIGH (ref 70–99)

## 2013-12-31 LAB — GLUCOSE, CAPILLARY
GLUCOSE-CAPILLARY: 188 mg/dL — AB (ref 70–99)
Glucose-Capillary: 133 mg/dL — ABNORMAL HIGH (ref 70–99)
Glucose-Capillary: 283 mg/dL — ABNORMAL HIGH (ref 70–99)

## 2014-01-01 LAB — GLUCOSE, CAPILLARY
GLUCOSE-CAPILLARY: 132 mg/dL — AB (ref 70–99)
GLUCOSE-CAPILLARY: 161 mg/dL — AB (ref 70–99)
GLUCOSE-CAPILLARY: 165 mg/dL — AB (ref 70–99)

## 2014-01-02 LAB — GLUCOSE, CAPILLARY
GLUCOSE-CAPILLARY: 146 mg/dL — AB (ref 70–99)
Glucose-Capillary: 176 mg/dL — ABNORMAL HIGH (ref 70–99)
Glucose-Capillary: 184 mg/dL — ABNORMAL HIGH (ref 70–99)
Glucose-Capillary: 185 mg/dL — ABNORMAL HIGH (ref 70–99)

## 2014-01-03 LAB — GLUCOSE, CAPILLARY
GLUCOSE-CAPILLARY: 224 mg/dL — AB (ref 70–99)
GLUCOSE-CAPILLARY: 146 mg/dL — AB (ref 70–99)
Glucose-Capillary: 186 mg/dL — ABNORMAL HIGH (ref 70–99)

## 2014-01-04 LAB — GLUCOSE, CAPILLARY: GLUCOSE-CAPILLARY: 211 mg/dL — AB (ref 70–99)

## 2014-01-05 LAB — GLUCOSE, CAPILLARY
GLUCOSE-CAPILLARY: 178 mg/dL — AB (ref 70–99)
GLUCOSE-CAPILLARY: 151 mg/dL — AB (ref 70–99)
Glucose-Capillary: 207 mg/dL — ABNORMAL HIGH (ref 70–99)
Glucose-Capillary: 179 mg/dL — ABNORMAL HIGH (ref 70–99)

## 2014-01-06 ENCOUNTER — Non-Acute Institutional Stay (SKILLED_NURSING_FACILITY): Payer: Medicare Other | Admitting: Internal Medicine

## 2014-01-06 DIAGNOSIS — I509 Heart failure, unspecified: Secondary | ICD-10-CM

## 2014-01-06 DIAGNOSIS — I5033 Acute on chronic diastolic (congestive) heart failure: Secondary | ICD-10-CM

## 2014-01-06 LAB — GLUCOSE, CAPILLARY
GLUCOSE-CAPILLARY: 199 mg/dL — AB (ref 70–99)
Glucose-Capillary: 112 mg/dL — ABNORMAL HIGH (ref 70–99)
Glucose-Capillary: 210 mg/dL — ABNORMAL HIGH (ref 70–99)
Glucose-Capillary: 146 mg/dL — ABNORMAL HIGH (ref 70–99)

## 2014-01-07 LAB — GLUCOSE, CAPILLARY
GLUCOSE-CAPILLARY: 126 mg/dL — AB (ref 70–99)
GLUCOSE-CAPILLARY: 119 mg/dL — AB (ref 70–99)
GLUCOSE-CAPILLARY: 163 mg/dL — AB (ref 70–99)
Glucose-Capillary: 169 mg/dL — ABNORMAL HIGH (ref 70–99)

## 2014-01-08 LAB — GLUCOSE, CAPILLARY
GLUCOSE-CAPILLARY: 131 mg/dL — AB (ref 70–99)
GLUCOSE-CAPILLARY: 159 mg/dL — AB (ref 70–99)
Glucose-Capillary: 168 mg/dL — ABNORMAL HIGH (ref 70–99)
Glucose-Capillary: 172 mg/dL — ABNORMAL HIGH (ref 70–99)

## 2014-01-09 LAB — GLUCOSE, CAPILLARY
GLUCOSE-CAPILLARY: 142 mg/dL — AB (ref 70–99)
Glucose-Capillary: 194 mg/dL — ABNORMAL HIGH (ref 70–99)

## 2014-01-10 LAB — GLUCOSE, CAPILLARY
GLUCOSE-CAPILLARY: 149 mg/dL — AB (ref 70–99)
GLUCOSE-CAPILLARY: 172 mg/dL — AB (ref 70–99)
Glucose-Capillary: 209 mg/dL — ABNORMAL HIGH (ref 70–99)

## 2014-01-10 NOTE — Progress Notes (Addendum)
Patient ID: [name], female   DOB: Jun 28, 1922, 80 y.o.   MRN: [id number]                   PROGRESS NOTE  DATE:  01/05/2014     FACILITY: [facility]     LEVEL OF CARE:   SNF   Acute Visit   CHIEF COMPLAINT:  Increasing edema, weight gain.    HISTORY OF PRESENT ILLNESS:  [name] is a lady who was initially admitted to hospital with COPD acute.    She also has diastolically-mediated heart failure with a history of mitral stenosis, pulmonary hypertension, and chronic renal failure.    Last lab work on 12/27/2013 revealed a BUN of 28, a creatinine of 1.28 which I believe is actually better than usual for her.  I have gently increased her Lasix, I believe to 60 mg a day from 40 when she came in.  She had been very stable.    Staff have noted a weight gain of 6 pounds, from 213 on 12/23/2013 up to 218 on 12/31/2013.  There is a change in scales here from one of the bed scales to, I am assuming, a standard scale.    REVIEW OF SYSTEMS:   CHEST/RESPIRATORY:   The patient is complaining of a little more shortness of breath.   CARDIAC:  No chest pain.    PHYSICAL EXAMINATION:   GENERAL APPEARANCE : She is not in distress.   CHEST/RESPIRATORY:  Clear air entry bilaterally.   CARDIOVASCULAR:   CARDIAC:  Heart sounds are normal.  Her JVP is visible at 90 degrees.   EDEMA/VARICOSITIES:  She does have 2-2+ coccyx edema, probably some degree of chronic venous stasis.    ASSESSMENT/PLAN:  Diastolically-mediated heart failure.  I am going to change her up to 80 mg a day.  We will follow up a basic metabolic panel next week.  I do not think that anything is seriously amiss, although I do think she has some real increase in edema.

## 2014-01-11 LAB — GLUCOSE, CAPILLARY
GLUCOSE-CAPILLARY: 242 mg/dL — AB (ref 70–99)
Glucose-Capillary: 190 mg/dL — ABNORMAL HIGH (ref 70–99)
Glucose-Capillary: 118 mg/dL — ABNORMAL HIGH (ref 70–99)
Glucose-Capillary: 178 mg/dL — ABNORMAL HIGH (ref 70–99)
Glucose-Capillary: 171 mg/dL — ABNORMAL HIGH (ref 70–99)

## 2014-01-12 LAB — GLUCOSE, CAPILLARY
GLUCOSE-CAPILLARY: 124 mg/dL — AB (ref 70–99)
GLUCOSE-CAPILLARY: 182 mg/dL — AB (ref 70–99)
Glucose-Capillary: 160 mg/dL — ABNORMAL HIGH (ref 70–99)

## 2014-01-13 LAB — GLUCOSE, CAPILLARY
GLUCOSE-CAPILLARY: 236 mg/dL — AB (ref 70–99)
Glucose-Capillary: 188 mg/dL — ABNORMAL HIGH (ref 70–99)
Glucose-Capillary: 129 mg/dL — ABNORMAL HIGH (ref 70–99)
Glucose-Capillary: 221 mg/dL — ABNORMAL HIGH (ref 70–99)
Glucose-Capillary: 157 mg/dL — ABNORMAL HIGH (ref 70–99)
Glucose-Capillary: 155 mg/dL — ABNORMAL HIGH (ref 70–99)

## 2014-01-14 LAB — GLUCOSE, CAPILLARY
Glucose-Capillary: 146 mg/dL — ABNORMAL HIGH (ref 70–99)
Glucose-Capillary: 163 mg/dL — ABNORMAL HIGH (ref 70–99)

## 2014-01-15 LAB — GLUCOSE, CAPILLARY
Glucose-Capillary: 165 mg/dL — ABNORMAL HIGH (ref 70–99)
Glucose-Capillary: 133 mg/dL — ABNORMAL HIGH (ref 70–99)
Glucose-Capillary: 198 mg/dL — ABNORMAL HIGH (ref 70–99)
Glucose-Capillary: 146 mg/dL — ABNORMAL HIGH (ref 70–99)
Glucose-Capillary: 184 mg/dL — ABNORMAL HIGH (ref 70–99)

## 2014-01-16 LAB — GLUCOSE, CAPILLARY
GLUCOSE-CAPILLARY: 219 mg/dL — AB (ref 70–99)
Glucose-Capillary: 141 mg/dL — ABNORMAL HIGH (ref 70–99)
Glucose-Capillary: 194 mg/dL — ABNORMAL HIGH (ref 70–99)
Glucose-Capillary: 128 mg/dL — ABNORMAL HIGH (ref 70–99)

## 2014-01-17 LAB — GLUCOSE, CAPILLARY
GLUCOSE-CAPILLARY: 136 mg/dL — AB (ref 70–99)
GLUCOSE-CAPILLARY: 170 mg/dL — AB (ref 70–99)
Glucose-Capillary: 116 mg/dL — ABNORMAL HIGH (ref 70–99)
Glucose-Capillary: 197 mg/dL — ABNORMAL HIGH (ref 70–99)

## 2014-01-18 ENCOUNTER — Ambulatory Visit (HOSPITAL_COMMUNITY): Payer: Medicare Other

## 2014-01-18 ENCOUNTER — Non-Acute Institutional Stay (SKILLED_NURSING_FACILITY): Payer: Medicare Other | Admitting: Internal Medicine

## 2014-01-18 ENCOUNTER — Ambulatory Visit (HOSPITAL_COMMUNITY)
Admit: 2014-01-18 | Discharge: 2014-01-18 | Disposition: A | Discharge status: Home / Self Care | Payer: Medicare Other | Source: Ambulatory Visit | Attending: Internal Medicine | Admitting: Internal Medicine

## 2014-01-18 DIAGNOSIS — I5030 Unspecified diastolic (congestive) heart failure: Secondary | ICD-10-CM

## 2014-01-18 DIAGNOSIS — I509 Heart failure, unspecified: Secondary | ICD-10-CM

## 2014-01-18 DIAGNOSIS — R609 Edema, unspecified: Secondary | ICD-10-CM

## 2014-01-18 DIAGNOSIS — M7989 Other specified soft tissue disorders: Secondary | ICD-10-CM | POA: Insufficient documentation

## 2014-01-18 DIAGNOSIS — M79609 Pain in unspecified limb: Secondary | ICD-10-CM | POA: Insufficient documentation

## 2014-01-18 LAB — GLUCOSE, CAPILLARY
GLUCOSE-CAPILLARY: 213 mg/dL — AB (ref 70–99)
Glucose-Capillary: 133 mg/dL — ABNORMAL HIGH (ref 70–99)

## 2014-01-18 NOTE — Progress Notes (Signed)
Patient ID: [name], female   DOB: 1922/06/25, 80 y.o.   MRN: [id number]   This is an acute visit.  Level of care skilled.  Facility [facility].  Chief complaint-acute visit followup CHF-edema.  History of present illness.  Patient is a pleasant 80 year old female with a history of CHF diastolic as well as COPD she was recently hospitalized for respiratory failure thought to be multi-factorial secondary to COPD and CHF exasperation.  She is currently on Lasix 80 mg a day.  She has been stable however nursing staff has noted some possibly increased edema as well as possible weight gain current weight is 219 this appears to be a gain of about 5 pounds since her admission weight.  She does not complaining of any shortness of breth  O2 saturations are in the 90s.  Family medical social history as been reviewed per admission note on 12/17/2013.  Medications have been reviewed per MAR.  Review of systems.  In general she is not complaining of any fever chills again she may have gained a bit of weight here area  Respiratory no complaints of shortness of breath or cough.  Cardiac no chest pain she does have lower extremity edema left greater than right.  GI does not complaining of any nausea vomiting diarrhea constipation.  Muscle skeletal does not complaining of joint pain.  Neurologic does not complaining of any dizziness or headaches.  Physical exam.  Temperature is 97.5 pulse 71 respirations 20 blood pressure 123/64 O2 saturation 97% weight is 219.  In general this is a very pleasant elderly female in no distress sitting comfortably in a wheelchair.  Her skin is warm and dry.  Oropharynx clear mucous membranes moist.  Chest is clear to auscultation with shallow air entry no labored breathing.  Heart is regular rate and rhythm without murmur gallop or rub she has 1-2+ lower extremity edema this appears to be somewhat increased on the left versus the right especially  lower leg and foot.  Abdomen is soft nontender positive bowel sounds.  Musculoskeletal moves all extremities x4 ambulating wheelchair does have some lower extremity weakness-there appears to be some tenderness to palpation of her lower left leg.  Neurologic is grossly intact speech is clear.  Psych she appears largely alert and oriented very pleasant individual.  Labs.  01/10/2014.  Sodium 145 potassium 3.6 BUN 29 creatinine 1.28.  12/27/2013.  WBC 9.3 hemoglobin 10.3 platelets 150.  Assessment and plan.  #1-history CHF with possibly increased edema-clinically she appears stable however does appear to have somewhat increased edema on the left versus the right will order a venous Doppler rule out any DVT.  Also secondary possible weight gain we'll order weighttomorrow to get an updated weight if any gain greater than 2 pounds notify provider  Also we'll order a basic metabolic panel with her history of renal insufficiency as well as a BNP-I did speak with Dr. Dellia Nims via phone he will followup tomorrow I suspect if renal function is stable and weight gainpersists  suspect he will increase her diuretic--  Also monitor vital signs pulse ox Q. shift-again she appears to be stable but will have to be monitored  [id number]

## 2014-01-19 ENCOUNTER — Non-Acute Institutional Stay (SKILLED_NURSING_FACILITY): Payer: Medicare Other | Admitting: Internal Medicine

## 2014-01-19 DIAGNOSIS — I5033 Acute on chronic diastolic (congestive) heart failure: Secondary | ICD-10-CM

## 2014-01-19 DIAGNOSIS — I509 Heart failure, unspecified: Secondary | ICD-10-CM

## 2014-01-19 DIAGNOSIS — N179 Acute kidney failure, unspecified: Secondary | ICD-10-CM

## 2014-01-19 DIAGNOSIS — N189 Chronic kidney disease, unspecified: Secondary | ICD-10-CM

## 2014-01-19 LAB — GLUCOSE, CAPILLARY
GLUCOSE-CAPILLARY: 161 mg/dL — AB (ref 70–99)
GLUCOSE-CAPILLARY: 139 mg/dL — AB (ref 70–99)
Glucose-Capillary: 189 mg/dL — ABNORMAL HIGH (ref 70–99)
Glucose-Capillary: 138 mg/dL — ABNORMAL HIGH (ref 70–99)
Glucose-Capillary: 191 mg/dL — ABNORMAL HIGH (ref 70–99)

## 2014-01-19 NOTE — Progress Notes (Signed)
Patient ID: [name], female   DOB: 04-06-1922, 80 y.o.   MRN: [id number]                   PROGRESS NOTE  DATE:  01/19/2014     FACILITY: [place]     LEVEL OF CARE:   SNF   Acute Visit   CHIEF COMPLAINT:  Increasing edema, weight gain.    HISTORY OF PRESENT ILLNESS:  [name] is a lady who was initially admitted to hospital with COPD acute.    She also has diastolically-mediated heart failure with a history of mitral stenosis, pulmonary hypertension, and chronic renal failure.    Last lab work on 01/19/2014 revealed a BUN of 40, a creatinine of 1.58 which I believe is actually stable  for her although higher than last time. BNP was 742.9 which is higher than previously 140 in january  I have gently increased her lasix to 80 mg from 40 mg when she came in.   Staff have noted a weight gain of 6 pounds, from 213 on 12/23/2013 up to 218 on 12/31/2013 now up to 219 lbs. .  There is a change in scales here from one of the bed scales to, I am assuming, a standard scale.    REVIEW OF SYSTEMS:   CHEST/RESPIRATORY:   The patient is complaining of a little more shortness of breath.   CARDIAC:  No chest pain.    PHYSICAL EXAMINATION:   GENERAL APPEARANCE : She is not in distress.   CHEST/RESPIRATORY:  Clear air entry bilaterally.   CARDIOVASCULAR:   CARDIAC:  Heart sounds are normal.  Her JVP is not visible at 90. 2+ coccyx edema. 1+ edema.    EDEMA/VARICOSITIES:  She does have 2-2+ coccyx edema, probably some degree of chronic venous stasis.    ASSESSMENT/PLAN:  Diastolically-mediated heart failure. There is probably enough evidence here to increase her diuretic. I am going to change her to Demadex and 80 mg a day.   Chronic renal failure this will need to be monitored

## 2014-01-20 LAB — GLUCOSE, CAPILLARY
GLUCOSE-CAPILLARY: 148 mg/dL — AB (ref 70–99)
GLUCOSE-CAPILLARY: 177 mg/dL — AB (ref 70–99)
Glucose-Capillary: 140 mg/dL — ABNORMAL HIGH (ref 70–99)
Glucose-Capillary: 208 mg/dL — ABNORMAL HIGH (ref 70–99)

## 2014-01-21 LAB — GLUCOSE, CAPILLARY
GLUCOSE-CAPILLARY: 134 mg/dL — AB (ref 70–99)
Glucose-Capillary: 135 mg/dL — ABNORMAL HIGH (ref 70–99)
Glucose-Capillary: 142 mg/dL — ABNORMAL HIGH (ref 70–99)
Glucose-Capillary: 205 mg/dL — ABNORMAL HIGH (ref 70–99)

## 2014-01-22 ENCOUNTER — Encounter: Payer: Self-pay | Admitting: Internal Medicine

## 2014-01-22 DIAGNOSIS — R609 Edema, unspecified: Secondary | ICD-10-CM | POA: Insufficient documentation

## 2014-01-22 DIAGNOSIS — I5030 Unspecified diastolic (congestive) heart failure: Secondary | ICD-10-CM | POA: Insufficient documentation

## 2014-01-22 LAB — GLUCOSE, CAPILLARY
GLUCOSE-CAPILLARY: 158 mg/dL — AB (ref 70–99)
GLUCOSE-CAPILLARY: 168 mg/dL — AB (ref 70–99)
GLUCOSE-CAPILLARY: 212 mg/dL — AB (ref 70–99)

## 2014-01-23 LAB — GLUCOSE, CAPILLARY
GLUCOSE-CAPILLARY: 142 mg/dL — AB (ref 70–99)
Glucose-Capillary: 138 mg/dL — ABNORMAL HIGH (ref 70–99)
Glucose-Capillary: 228 mg/dL — ABNORMAL HIGH (ref 70–99)

## 2014-01-24 LAB — GLUCOSE, CAPILLARY: GLUCOSE-CAPILLARY: 155 mg/dL — AB (ref 70–99)

## 2014-01-29 LAB — GLUCOSE, CAPILLARY
GLUCOSE-CAPILLARY: 147 mg/dL — AB (ref 70–99)
Glucose-Capillary: 135 mg/dL — ABNORMAL HIGH (ref 70–99)

## 2014-01-30 LAB — GLUCOSE, CAPILLARY
GLUCOSE-CAPILLARY: 127 mg/dL — AB (ref 70–99)
GLUCOSE-CAPILLARY: 160 mg/dL — AB (ref 70–99)
Glucose-Capillary: 269 mg/dL — ABNORMAL HIGH (ref 70–99)
Glucose-Capillary: 177 mg/dL — ABNORMAL HIGH (ref 70–99)

## 2014-01-31 LAB — GLUCOSE, CAPILLARY
GLUCOSE-CAPILLARY: 139 mg/dL — AB (ref 70–99)
Glucose-Capillary: 137 mg/dL — ABNORMAL HIGH (ref 70–99)
Glucose-Capillary: 140 mg/dL — ABNORMAL HIGH (ref 70–99)
Glucose-Capillary: 150 mg/dL — ABNORMAL HIGH (ref 70–99)

## 2014-02-01 ENCOUNTER — Encounter: Payer: Self-pay | Admitting: Internal Medicine

## 2014-02-01 ENCOUNTER — Non-Acute Institutional Stay (SKILLED_NURSING_FACILITY): Payer: Medicare Other | Admitting: Internal Medicine

## 2014-02-01 DIAGNOSIS — I5030 Unspecified diastolic (congestive) heart failure: Secondary | ICD-10-CM

## 2014-02-01 DIAGNOSIS — I509 Heart failure, unspecified: Secondary | ICD-10-CM

## 2014-02-01 DIAGNOSIS — J441 Chronic obstructive pulmonary disease with (acute) exacerbation: Secondary | ICD-10-CM

## 2014-02-01 DIAGNOSIS — N189 Chronic kidney disease, unspecified: Secondary | ICD-10-CM

## 2014-02-01 DIAGNOSIS — L299 Pruritus, unspecified: Secondary | ICD-10-CM

## 2014-02-01 LAB — GLUCOSE, CAPILLARY: GLUCOSE-CAPILLARY: 148 mg/dL — AB (ref 70–99)

## 2014-02-01 NOTE — Progress Notes (Signed)
Patient ID: [name], female   DOB: February 13, 1922, 80 y.o.   MRN: [id number]   This is an acute visit.  Level of care skilled.  Facility [facility].  Chief complaint acute visit followup CHF-renal insufficiency-itching.  History of present illness.  Patient is a pleasant 80 year old female with a history of diastolic CHF as well as COPD.  She was recently hospitalized for respiratory failure thought multifactoral secondary to COPD and CHF exasperation.  Several weeks ago she was noted to have some weight gain and increased edema-Dr. Dellia Nims did switch her from Lasix to Demadex 80 mg a day in her weight and edema appears to have moderated current weight 216.6 which is down about 3 pounds since last month.  She does not complaining of any increased shortness of breath from baseline.  Updated lab shows a creatinine of 1.74 on May 25 with a BUN of 43 back on May 20 was creatinine 1.5 a BUN of 40.  She has [place] of the [place].    Her only complaint today is diffuse itching apparently this has been a long-term situation as she has Benadryl when necessary-  Family medical social history has been reviewed her admission note on 12/17/2013.  Medications have been reviewed per MAR.  Review of systems.  In general no complaints of fever or chills.  Skin does complain of diffuse itching of back abdomen had a perineal area.  Respiratory does not complaining of increased shortness of breath from baseline has a history of COPD.  Cardiac no chest pain has what appears to be chronic lower extremity edema.  GI does not complaining of any abdominal discomfort other than itching on her abdomen no nausea vomiting diarrhea or constipation complaints.  Muscle skeletal does not complaining of joint pain.  Neurologic is not complaining of any dizziness or headaches.  Physical exam.  Temperature is 98.4 pulse 66 respirations 18 blood  pressure 118/69 weight is 216.6.  In general this is a pleasant elderly female in no distress sitting comfortably in her recliner.  Her skin is warm and dry adrenals and chronic bruising of her arms bilaterally I do not really see any rashes of her abdomen or back her scalp face area.  Oropharynx is clear mucous membranes moist.  Chest there is no labored breathing she has a small amount of wheezing on expiration at the bases.  There is shallow air entry.  Heart is regular rate and rhythm without murmur gallop or rub she has a say 1+ lower extremity edema.  Abdomen is soft obese nontender with positive bowel sounds.  GU-limited exam since patient is sitting in chair but I did not note any rash or discharge from the vaginal area  Muscle skeletal Limited exam patient is sitting in chair chair but does ambulate in a wheelchair-.  Neurologic appears grossly intact no lateralizing findings--her speech is clear.  Psych she appears alert and oriented pleasant and appropriate   Labs.  01/24/2014.  Sodium 142 potassium 4.1 BUN 43 creatinine 1.74.  01/19/2014.  Sodium 141 potassium 4.1 BUN 40 creatinine 1.58.  BnP-742.9  01/10/2014.  Sodium 145 potassium 3.6 BUN 29 creatinine 1.28.  12/27/2013.  WBC 9.3 hemoglobin 10.3 platelets 150.  Sodium 141 potassium 4.3 BUN 28 creatinine 1.28.  Assessment and plan.  #6-HYWVPXT of diastolic CHF complicated with renal insufficiency-creatinine is rising some patient was out of facility last week will obtain a lab tomorrow morning--so we can have  follow up on her creatinine-appears her baseline creatinine is in the 1.5 1.6 range. Her weight appears to have stabilized.  #2-history of a some wheezing--apparently family has noted this as well I did discuss getting an x-ray and family is agreeable to this-she is on routine nebulizers as well as when necessary-she does not appear to be in any distress certainly but with her history of CHF and  COPD would like to see where we stand  #3-history of itching diffuse-I did not note any rash on exam--did speak with her daughter via phone-apparently this is a long-term situation and she has responded better to Atarax-stating the Benadryl has given her mother dry mouth and that actually is a complaint that patient has today Will discontinue the Benadryl and start Atarax 12.5 mg every 6 hours when necessary itching.  Family also will bringiCetaphil soap as well--saying the past her mother is responded well to this.  Also will order moisturizing cream twice a day to affected areas to see if this will help  [id number] .

## 2014-02-02 ENCOUNTER — Ambulatory Visit (HOSPITAL_COMMUNITY): Payer: Medicare Other | Attending: Internal Medicine

## 2014-02-02 DIAGNOSIS — R062 Wheezing: Secondary | ICD-10-CM | POA: Insufficient documentation

## 2014-02-02 LAB — GLUCOSE, CAPILLARY
GLUCOSE-CAPILLARY: 187 mg/dL — AB (ref 70–99)
Glucose-Capillary: 152 mg/dL — ABNORMAL HIGH (ref 70–99)
Glucose-Capillary: 148 mg/dL — ABNORMAL HIGH (ref 70–99)
Glucose-Capillary: 203 mg/dL — ABNORMAL HIGH (ref 70–99)

## 2014-02-03 ENCOUNTER — Encounter: Payer: Self-pay | Admitting: Cardiology

## 2014-02-03 ENCOUNTER — Non-Acute Institutional Stay (SKILLED_NURSING_FACILITY): Payer: Medicare Other | Admitting: Internal Medicine

## 2014-02-03 DIAGNOSIS — R350 Frequency of micturition: Secondary | ICD-10-CM

## 2014-02-03 DIAGNOSIS — I5032 Chronic diastolic (congestive) heart failure: Secondary | ICD-10-CM

## 2014-02-03 DIAGNOSIS — I050 Rheumatic mitral stenosis: Secondary | ICD-10-CM | POA: Insufficient documentation

## 2014-02-03 DIAGNOSIS — M25559 Pain in unspecified hip: Secondary | ICD-10-CM

## 2014-02-03 DIAGNOSIS — N289 Disorder of kidney and ureter, unspecified: Secondary | ICD-10-CM

## 2014-02-03 LAB — GLUCOSE, CAPILLARY
Glucose-Capillary: 127 mg/dL — ABNORMAL HIGH (ref 70–99)
Glucose-Capillary: 188 mg/dL — ABNORMAL HIGH (ref 70–99)
Glucose-Capillary: 159 mg/dL — ABNORMAL HIGH (ref 70–99)

## 2014-02-03 NOTE — Progress Notes (Signed)
Patient ID: [name], female   DOB: [birth year], 80 y.o.   MRN: [id number]   This is an acute visit.  Level of care skilled.  Facility [facility] .  Chief complaint acute -secondary to left hip discomfort-possible dysuria-followup renal insufficiency with history of CHF .  History of present illness.  Patient is a pleasant 80 year old female with a history of diastolic CHF as well as COPD.  She was recently hospitalized for respiratory failure thought multifactoral secondary to COPD and CHF exasperation.  Several weeks ago she was noted to have some weight gain and increased edema-Dr. Dellia Nims did switch her from Lasix to Demadex 80 mg a day in her weight and edema appears to have moderated current weight 215.8 which is down about 4 pounds since last month.  She does not complaining of any increased shortness of breath from baseline with some wheezing was noted on exam earlier this week her daughter was concerned as well-we ordered a chest x-ray that did not show any acute disease-and her lungs actually appear to be somewhat clearer today again no shortness of breath complaints.   She does have a history of some chronic renal insufficiency with baseline creatinine appearing to be in the mid ones-last month her creatinine did rise to 1.74 we ordered updated labs when she got back from a leave of absence from the facility this was done on June 3 and creatinine is 1.76 which is somewhat comparable with the previous lab again her Demadex recently was increased   Her main complaint today is some left hip discomfort-apparently there's been no history of trauma she does not complaining of pain apparently when sitting but when it is moved she does complain of discomfort-.  Apparently is also been some back pain and her daughter is concerned about possibly urinary tract infection since apparently she presents this wayat times with  pain complaints in that area--apparently at times also has some increase  frequency-I. do not she does have a history of stress incontinence on oxybutynin   Family medical social history has been reviewed her admission note on 12/17/2013 .  Medications have been reviewed per MAR .  Review of systems.  In general no complaints of fever or chills.  Skin --had complained of itching but does not complaining of that day.  Respiratory does not complaining of increased shortness of breath from baseline has a history of COPD.  Cardiac no chest pain has what appears to be chronic lower extremity edema.  GI does not complaining of any abdominal discomfort no nausea vomiting diarrhea or constipation complaints.  Muscle skeletal--does complain of left hip discomfort with movement. GU-does not complaining of pain but apparently she's had some increased frequency  Neurologic is not complaining of any dizziness or headaches  .  Physical exam.  Temperature 98.3 pulse 93 respirations 20 blood pressure 152/77-124/63 in this range O2 saturation is in the 90s weight is 215.8.  In general this is a pleasant elderly female in no distress sitting comfortably in her recliner.  Her skin is warm and dry adrenals and chronic bruising of her arms bilaterally  Oropharynx is clear mucous membranes moist.  Chest there is no labored breathing she has a small amount of wheezing that resolves with cough  There is shallow air entry.  Heart is regular rate and rhythm without murmur gallop or rub she has a say 1+ lower extremity edema. .  GU-limited exam since patient is sitting in chair but could not really appreciate  any suprapubic tenderness  Muscle skeletal Limited exam patient is sitting in chair --but there was no pain with palpation of the left hip there is no bruising or deformity noted -- with flexion and extension at the hip she did experience some mild discomfort-.  There does not appear to be significant pain with palpation of the back or deformity noted   Neurologic appears grossly  intact no lateralizing findings--her speech is clear.  Psych she appears alert and oriented pleasant and appropriate    Labs February 02 2014.  WBC 7.4 hemoglobin 11.8 platelets 187.  Sodium 141 potassium 3.9 BUN 47 creatinine 1.76 .  01/24/2014.  Sodium 142 potassium 4.1 BUN 43 creatinine 1.74.  01/19/2014.  Sodium 141 potassium 4.1 BUN 40 creatinine 1.58.  BnP-742.9  01/10/2014.  Sodium 145 potassium 3.6 BUN 29 creatinine 1.28.  12/27/2013.  WBC 9.3 hemoglobin 10.3 platelets 150.  Sodium 141 potassium 4.3 BUN 28 creatinine 1.28.   Assessment and plan.  #1-left hip discomfort-we'll obtain an x-ray of the hip and back-this was discussed with her daughter is in the room-continue to monitor again this appears to be intermittent and more so with movement.  #2-question UTI-apparently she's had some increased frequency-question back pain-Will obtain a UA C&S.  #3 renal insufficiency this appears on the higher end of her baseline with a creatinine of 1.76-we'll update this next week to insure stability this has been stable since late May when the [facility] was increased.  #4-history COPD respiratory issues-chest x-ray was reassuring exam today was reassuring as well she appears to be relatively stable in this regard  #5-history CHF-this appears to be stable her weights have been stable chest x-ray did not show any pulmonary edema which is reassuring Icontinue to monitor     [id number]  .

## 2014-02-04 ENCOUNTER — Encounter: Payer: Self-pay | Admitting: Cardiology

## 2014-02-04 ENCOUNTER — Ambulatory Visit (INDEPENDENT_AMBULATORY_CARE_PROVIDER_SITE_OTHER): Payer: Medicare Other | Admitting: Cardiology

## 2014-02-04 ENCOUNTER — Ambulatory Visit (HOSPITAL_COMMUNITY): Payer: Medicare Other | Attending: Cardiology

## 2014-02-04 ENCOUNTER — Ambulatory Visit (HOSPITAL_COMMUNITY): Payer: Medicare Other

## 2014-02-04 VITALS — BP 142/52 | HR 90 | Ht 65.0 in | Wt 218.0 lb

## 2014-02-04 DIAGNOSIS — I700 Atherosclerosis of aorta: Secondary | ICD-10-CM | POA: Diagnosis not present

## 2014-02-04 DIAGNOSIS — I050 Rheumatic mitral stenosis: Secondary | ICD-10-CM

## 2014-02-04 DIAGNOSIS — I10 Essential (primary) hypertension: Secondary | ICD-10-CM

## 2014-02-04 DIAGNOSIS — I5032 Chronic diastolic (congestive) heart failure: Secondary | ICD-10-CM

## 2014-02-04 DIAGNOSIS — M47817 Spondylosis without myelopathy or radiculopathy, lumbosacral region: Secondary | ICD-10-CM | POA: Insufficient documentation

## 2014-02-04 DIAGNOSIS — M25559 Pain in unspecified hip: Secondary | ICD-10-CM | POA: Insufficient documentation

## 2014-02-04 DIAGNOSIS — Z96649 Presence of unspecified artificial hip joint: Secondary | ICD-10-CM | POA: Insufficient documentation

## 2014-02-04 DIAGNOSIS — M4310 Spondylolisthesis, site unspecified: Secondary | ICD-10-CM | POA: Diagnosis not present

## 2014-02-04 LAB — GLUCOSE, CAPILLARY
GLUCOSE-CAPILLARY: 144 mg/dL — AB (ref 70–99)
GLUCOSE-CAPILLARY: 113 mg/dL — AB (ref 70–99)
Glucose-Capillary: 140 mg/dL — ABNORMAL HIGH (ref 70–99)
Glucose-Capillary: 162 mg/dL — ABNORMAL HIGH (ref 70–99)

## 2014-02-04 NOTE — Assessment & Plan Note (Signed)
Reviewed records including echocardiogram, discussed with patient and her daughter present. Anticipate conservative medical therapy going forward. Daughter was very comfortable with her current care at the [facility] under the direction of Dr. Dellia Nims. Weight is stable and patient seems to be clinically stable as well. I made no changes today and offered as needed followup if any questions arise regarding difficulty with her medical therapy.

## 2014-02-04 NOTE — Assessment & Plan Note (Signed)
Moderate by echocardiogram and a contributor to diastolic heart failure as well.

## 2014-02-04 NOTE — Progress Notes (Signed)
Clinical Summary [name] is an 80 y.o.female referred to the office by Dr. Dellia Nims for cardiology consultation. Extensive records reviewed. She was previously followed by Dr.Luking, now resides in the [facility]. She was hospitalized back in April with respiratory failure that was described as being multifactorial in the setting of COPD, silent aspiration and also acute on chronic diastolic heart failure. She diuresed approximately 4-1/2 L during her stay.   Echocardiogram from April of this year reported mild LVH with LVEF 16-07%, grade 1 diastolic dysfunction, sclerotic aortic valve without stenosis, mild aortic regurgitation, thickened and calcified mitral valve with evidence of moderate stenosis, mean gradient 7 mm mercury, mild mitral regurgitation, moderate left atrial enlargement, mildly dilated right ventricle, PASP 64 mm mercury.  Lab work from April showed potassium 4.5, BUN 82, creatinine 1.7. ECG from April showed normal sinus rhythm.bNo significant change in weight compared to April, down 2 pounds.  She is here with her middle daughter today. Her daughter tells me that the hospitalist back in April had recommended a possible cardiology followup, however none was made. The daughter decided to follow through on this, although in general anticipates conservative management and would like to keep her doctor visits to a minimum. Patient is in a wheelchair, appears comfortable, states her breathing is better than it was in April, occasionally has wheezing. She has a good appetite, no reported palpitations or chest pain.   Allergies  Allergen Reactions  . Carbapenems Other (See Comments)    unknown  . Cephalosporins Other (See Comments)    unknown  . Amoxicillin Rash  . Penicillins Rash    Medication list from nursing facility reviewed.   Past Medical History  Diagnosis Date  . Breast cancer   . Essential hypertension, benign   . Depression   . GERD (gastroesophageal  reflux disease)   . Hypercholesteremia   . CKD (chronic kidney disease) stage 3, GFR 30-59 ml/min   . Pneumonia   . UTI (lower urinary tract infection)   . Arthritis   . Asthma   . Venous stasis   . Type 2 diabetes mellitus   . Chronic diastolic heart failure     LVEF 70%  . Mitral stenosis     Moderate  . Secondary pulmonary hypertension     PASP 64 mmHg    Past Surgical History  Procedure Laterality Date  . Replacement total knee    . Mastectomy    . Total hip arthroplasty    . Carotid endarterectomy    . Cataracts      Family History  Problem Relation Age of Onset  . Asthma Other   . Diabetes Other     Social History [name] reports that she has never smoked. She has never used smokeless tobacco. [name] reports that she does not drink alcohol.  Review of Systems As outlined above.  Physical Examination Filed Vitals:   02/04/14 0903  BP: 142/52  Pulse: 90   Filed Weights   02/04/14 0903  Weight: 218 lb (98.884 kg)   Overweight woman, seated in a wheelchair, appears comfortable. HEENT: Conjunctiva and lids normal, oropharynx clear with moist mucosa. Neck: Supple, no elevated JVP or carotid bruits, no thyromegaly. Lungs: Clear to auscultation, nonlabored breathing at rest. Cardiac: Regular rate and rhythm, no S3, 3-7/1 systolic murmur at base, no diastolic murmur, no pericardial rub. Abdomen: Soft, nontender, bowel sounds present, no guarding or rebound. Extremities: 1+ edema, distal pulses 2+. Skin: Warm and dry. Musculoskeletal:  No kyphosis. Neuropsychiatric: Alert and oriented x2, affect grossly appropriate.   Problem List and Plan   Chronic diastolic heart failure Reviewed records including echocardiogram, discussed with patient and her daughter present. Anticipate conservative medical therapy going forward. Daughter was very comfortable with her current care at the [facility] under the direction of Dr. Dellia Nims. Weight is stable and  patient seems to be clinically stable as well. I made no changes today and offered as needed followup if any questions arise regarding difficulty with her medical therapy.  Mitral stenosis Moderate by echocardiogram and a contributor to diastolic heart failure as well.  Essential hypertension, benign Blood pressure mildly elevated today.    Satira Sark, M.D., F.A.C.C.

## 2014-02-04 NOTE — Assessment & Plan Note (Signed)
Blood pressure mildly elevated today

## 2014-02-04 NOTE — Patient Instructions (Signed)
Your physician recommends that you schedule a follow-up appointment in: only as needed.       Thank you for choosing [facility] !         

## 2014-02-05 LAB — GLUCOSE, CAPILLARY
Glucose-Capillary: 180 mg/dL — ABNORMAL HIGH (ref 70–99)
Glucose-Capillary: 148 mg/dL — ABNORMAL HIGH (ref 70–99)

## 2014-02-06 LAB — GLUCOSE, CAPILLARY
Glucose-Capillary: 140 mg/dL — ABNORMAL HIGH (ref 70–99)
Glucose-Capillary: 162 mg/dL — ABNORMAL HIGH (ref 70–99)
Glucose-Capillary: 187 mg/dL — ABNORMAL HIGH (ref 70–99)

## 2014-02-07 LAB — GLUCOSE, CAPILLARY
GLUCOSE-CAPILLARY: 113 mg/dL — AB (ref 70–99)
Glucose-Capillary: 144 mg/dL — ABNORMAL HIGH (ref 70–99)
Glucose-Capillary: 206 mg/dL — ABNORMAL HIGH (ref 70–99)

## 2014-02-08 LAB — GLUCOSE, CAPILLARY
GLUCOSE-CAPILLARY: 146 mg/dL — AB (ref 70–99)
GLUCOSE-CAPILLARY: 148 mg/dL — AB (ref 70–99)
GLUCOSE-CAPILLARY: 160 mg/dL — AB (ref 70–99)
Glucose-Capillary: 156 mg/dL — ABNORMAL HIGH (ref 70–99)

## 2014-02-09 LAB — GLUCOSE, CAPILLARY
Glucose-Capillary: 127 mg/dL — ABNORMAL HIGH (ref 70–99)
Glucose-Capillary: 184 mg/dL — ABNORMAL HIGH (ref 70–99)
Glucose-Capillary: 155 mg/dL — ABNORMAL HIGH (ref 70–99)
Glucose-Capillary: 178 mg/dL — ABNORMAL HIGH (ref 70–99)

## 2014-02-10 LAB — GLUCOSE, CAPILLARY
Glucose-Capillary: 149 mg/dL — ABNORMAL HIGH (ref 70–99)
Glucose-Capillary: 207 mg/dL — ABNORMAL HIGH (ref 70–99)

## 2014-02-11 LAB — GLUCOSE, CAPILLARY
GLUCOSE-CAPILLARY: 131 mg/dL — AB (ref 70–99)
GLUCOSE-CAPILLARY: 174 mg/dL — AB (ref 70–99)

## 2014-02-12 LAB — GLUCOSE, CAPILLARY
GLUCOSE-CAPILLARY: 138 mg/dL — AB (ref 70–99)
Glucose-Capillary: 186 mg/dL — ABNORMAL HIGH (ref 70–99)
Glucose-Capillary: 167 mg/dL — ABNORMAL HIGH (ref 70–99)
Glucose-Capillary: 206 mg/dL — ABNORMAL HIGH (ref 70–99)

## 2014-02-13 LAB — GLUCOSE, CAPILLARY
GLUCOSE-CAPILLARY: 138 mg/dL — AB (ref 70–99)
GLUCOSE-CAPILLARY: 179 mg/dL — AB (ref 70–99)
Glucose-Capillary: 182 mg/dL — ABNORMAL HIGH (ref 70–99)

## 2014-02-14 LAB — GLUCOSE, CAPILLARY
GLUCOSE-CAPILLARY: 138 mg/dL — AB (ref 70–99)
GLUCOSE-CAPILLARY: 131 mg/dL — AB (ref 70–99)
GLUCOSE-CAPILLARY: 161 mg/dL — AB (ref 70–99)
Glucose-Capillary: 181 mg/dL — ABNORMAL HIGH (ref 70–99)
Glucose-Capillary: 143 mg/dL — ABNORMAL HIGH (ref 70–99)

## 2014-02-15 LAB — GLUCOSE, CAPILLARY: Glucose-Capillary: 139 mg/dL — ABNORMAL HIGH (ref 70–99)

## 2014-02-16 ENCOUNTER — Non-Acute Institutional Stay (SKILLED_NURSING_FACILITY): Payer: Medicare Other | Admitting: Internal Medicine

## 2014-02-16 DIAGNOSIS — E1129 Type 2 diabetes mellitus with other diabetic kidney complication: Secondary | ICD-10-CM

## 2014-02-16 LAB — GLUCOSE, CAPILLARY
GLUCOSE-CAPILLARY: 160 mg/dL — AB (ref 70–99)
Glucose-Capillary: 172 mg/dL — ABNORMAL HIGH (ref 70–99)
Glucose-Capillary: 136 mg/dL — ABNORMAL HIGH (ref 70–99)
Glucose-Capillary: 122 mg/dL — ABNORMAL HIGH (ref 70–99)

## 2014-02-20 LAB — GLUCOSE, CAPILLARY: Glucose-Capillary: 376 mg/dL — ABNORMAL HIGH (ref 70–99)

## 2014-02-21 LAB — GLUCOSE, CAPILLARY
GLUCOSE-CAPILLARY: 130 mg/dL — AB (ref 70–99)
Glucose-Capillary: 144 mg/dL — ABNORMAL HIGH (ref 70–99)

## 2014-02-24 NOTE — Progress Notes (Addendum)
Patient ID: [name], female   DOB: 1922-01-15, 80 y.o.   MRN: [id number]                  PROGRESS NOTE  DATE:  02/16/2014    FACILITY: [place]    LEVEL OF CARE:   SNF   Acute Visit   CHIEF COMPLAINT:  Follow up diabetes.    HISTORY OF PRESENT ILLNESS:  [name] is a type 2 diabetic.  She has not previously been on treatment.  However, when she gets sick or gets put on steroids for COPD exacerbation, then her CBGs go up.  During her most recent hospitalization, her hemoglobin A1c was 6.5.  She is continued on an aggressive sliding scale here, which I have only been made aware of today.  Her blood sugars yesterday were 139, 153, 136.  Episodically, her a.c. dinner blood sugars get as high as 180.    ASSESSMENT/PLAN:  Type 2 diabetes.  I think this patient fits the current definitions of diabetes.  However, I do not think we need to be so aggressive about this.  I will keep a watch on her fasting blood sugars and do a hemoglobin A1c with next labs.  We will make a determination of whether she needs oral therapy.  However, I do not believe she is going to require blood sugars four times a day.

## 2014-02-25 LAB — GLUCOSE, CAPILLARY: Glucose-Capillary: 149 mg/dL — ABNORMAL HIGH (ref 70–99)

## 2014-04-13 ENCOUNTER — Non-Acute Institutional Stay (SKILLED_NURSING_FACILITY): Payer: Medicare Other | Admitting: Internal Medicine

## 2014-04-13 ENCOUNTER — Ambulatory Visit (HOSPITAL_COMMUNITY): Payer: Medicare Other

## 2014-04-13 DIAGNOSIS — R05 Cough: Secondary | ICD-10-CM

## 2014-04-13 DIAGNOSIS — I150 Renovascular hypertension: Secondary | ICD-10-CM

## 2014-04-13 DIAGNOSIS — R059 Cough, unspecified: Secondary | ICD-10-CM

## 2014-04-13 DIAGNOSIS — R062 Wheezing: Secondary | ICD-10-CM | POA: Insufficient documentation

## 2014-04-13 DIAGNOSIS — I509 Heart failure, unspecified: Secondary | ICD-10-CM

## 2014-04-13 DIAGNOSIS — I5033 Acute on chronic diastolic (congestive) heart failure: Secondary | ICD-10-CM

## 2014-04-13 DIAGNOSIS — R0602 Shortness of breath: Secondary | ICD-10-CM | POA: Diagnosis not present

## 2014-04-14 ENCOUNTER — Inpatient Hospital Stay (HOSPITAL_COMMUNITY)
Admission: EM | Admit: 2014-04-14 | Discharge: 2014-04-20 | DRG: 291 | Disposition: A | Discharge status: Skilled Nursing Facility | Payer: Medicare Other | Attending: Family Medicine | Admitting: Family Medicine

## 2014-04-14 ENCOUNTER — Encounter (HOSPITAL_COMMUNITY): Payer: Self-pay | Admitting: Emergency Medicine

## 2014-04-14 DIAGNOSIS — R1314 Dysphagia, pharyngoesophageal phase: Secondary | ICD-10-CM

## 2014-04-14 DIAGNOSIS — E119 Type 2 diabetes mellitus without complications: Secondary | ICD-10-CM

## 2014-04-14 DIAGNOSIS — R1310 Dysphagia, unspecified: Secondary | ICD-10-CM | POA: Diagnosis present

## 2014-04-14 DIAGNOSIS — I150 Renovascular hypertension: Secondary | ICD-10-CM | POA: Insufficient documentation

## 2014-04-14 DIAGNOSIS — T380X5A Adverse effect of glucocorticoids and synthetic analogues, initial encounter: Secondary | ICD-10-CM | POA: Diagnosis present

## 2014-04-14 DIAGNOSIS — I10 Essential (primary) hypertension: Secondary | ICD-10-CM

## 2014-04-14 DIAGNOSIS — I129 Hypertensive chronic kidney disease with stage 1 through stage 4 chronic kidney disease, or unspecified chronic kidney disease: Secondary | ICD-10-CM | POA: Diagnosis present

## 2014-04-14 DIAGNOSIS — Z825 Family history of asthma and other chronic lower respiratory diseases: Secondary | ICD-10-CM

## 2014-04-14 DIAGNOSIS — E1121 Type 2 diabetes mellitus with diabetic nephropathy: Secondary | ICD-10-CM | POA: Diagnosis present

## 2014-04-14 DIAGNOSIS — I872 Venous insufficiency (chronic) (peripheral): Secondary | ICD-10-CM | POA: Diagnosis present

## 2014-04-14 DIAGNOSIS — J441 Chronic obstructive pulmonary disease with (acute) exacerbation: Secondary | ICD-10-CM

## 2014-04-14 DIAGNOSIS — F0390 Unspecified dementia without behavioral disturbance: Secondary | ICD-10-CM | POA: Diagnosis present

## 2014-04-14 DIAGNOSIS — Z96659 Presence of unspecified artificial knee joint: Secondary | ICD-10-CM

## 2014-04-14 DIAGNOSIS — N289 Disorder of kidney and ureter, unspecified: Secondary | ICD-10-CM

## 2014-04-14 DIAGNOSIS — I2789 Other specified pulmonary heart diseases: Secondary | ICD-10-CM | POA: Diagnosis present

## 2014-04-14 DIAGNOSIS — Z66 Do not resuscitate: Secondary | ICD-10-CM | POA: Diagnosis present

## 2014-04-14 DIAGNOSIS — J45901 Unspecified asthma with (acute) exacerbation: Secondary | ICD-10-CM

## 2014-04-14 DIAGNOSIS — Z833 Family history of diabetes mellitus: Secondary | ICD-10-CM

## 2014-04-14 DIAGNOSIS — K219 Gastro-esophageal reflux disease without esophagitis: Secondary | ICD-10-CM | POA: Diagnosis present

## 2014-04-14 DIAGNOSIS — E7800 Pure hypercholesterolemia, unspecified: Secondary | ICD-10-CM | POA: Diagnosis present

## 2014-04-14 DIAGNOSIS — Z79899 Other long term (current) drug therapy: Secondary | ICD-10-CM

## 2014-04-14 DIAGNOSIS — Z96649 Presence of unspecified artificial hip joint: Secondary | ICD-10-CM

## 2014-04-14 DIAGNOSIS — M129 Arthropathy, unspecified: Secondary | ICD-10-CM | POA: Diagnosis present

## 2014-04-14 DIAGNOSIS — N184 Chronic kidney disease, stage 4 (severe): Secondary | ICD-10-CM

## 2014-04-14 DIAGNOSIS — J9600 Acute respiratory failure, unspecified whether with hypoxia or hypercapnia: Secondary | ICD-10-CM | POA: Diagnosis present

## 2014-04-14 DIAGNOSIS — J9601 Acute respiratory failure with hypoxia: Secondary | ICD-10-CM

## 2014-04-14 DIAGNOSIS — Z853 Personal history of malignant neoplasm of breast: Secondary | ICD-10-CM

## 2014-04-14 DIAGNOSIS — I5033 Acute on chronic diastolic (congestive) heart failure: Principal | ICD-10-CM

## 2014-04-14 DIAGNOSIS — I509 Heart failure, unspecified: Secondary | ICD-10-CM

## 2014-04-14 DIAGNOSIS — Z7982 Long term (current) use of aspirin: Secondary | ICD-10-CM

## 2014-04-14 NOTE — ED Provider Notes (Signed)
CSN: [id number]     Arrival date & time 04/14/14  2346 History   First MD Initiated Contact with Patient 04/14/14 2355     Chief Complaint  Patient presents with  . Shortness of Breath     (Consider location/radiation/quality/duration/timing/severity/associated sxs/prior Treatment) Patient is a 80 y.o. female presenting with shortness of breath. The history is provided by the nursing home and the EMS personnel. The history is limited by the condition of the patient (Dementia).  Shortness of Breath She was sent from her nursing home because of dyspnea. She has a history of COPD and has been treated with nebulizers in the past but apparently did not receive any nebulizer tonight. Patient is not able to give any reliable history. Progress note by her geriatrician from yesterday noted increased cough and dyspnea. She does have a history of CHF.  Past Medical History  Diagnosis Date  . Breast cancer   . Essential hypertension, benign   . Depression   . GERD (gastroesophageal reflux disease)   . Hypercholesteremia   . CKD (chronic kidney disease) stage 3, GFR 30-59 ml/min   . Pneumonia   . UTI (lower urinary tract infection)   . Arthritis   . Asthma   . Venous stasis   . Type 2 diabetes mellitus   . Chronic diastolic heart failure     LVEF 70%  . Mitral stenosis     Moderate  . Secondary pulmonary hypertension     PASP 64 mmHg   Past Surgical History  Procedure Laterality Date  . Replacement total knee    . Mastectomy    . Total hip arthroplasty    . Carotid endarterectomy    . Cataracts     Family History  Problem Relation Age of Onset  . Asthma Other   . Diabetes Other    History  Substance Use Topics  . Smoking status: Never Smoker   . Smokeless tobacco: Never Used  . Alcohol Use: No   OB History   Grav Para Term Preterm Abortions TAB SAB Ect Mult Living                 Review of Systems  Unable to perform ROS: Dementia  Respiratory: Positive for shortness of  breath.   All other systems reviewed and are negative.     Allergies  Carbapenems; Cephalosporins; Amoxicillin; and Penicillins  Home Medications   Prior to Admission medications   Medication Sig Start Date End Date Taking? Authorizing Provider  acetaminophen (TYLENOL) 325 MG tablet Take 650 mg by mouth every 6 (six) hours as needed.    Historical Provider, MD  albuterol (PROVENTIL HFA;VENTOLIN HFA) 108 (90 BASE) MCG/ACT inhaler Inhale 2 puffs into the lungs every 6 (six) hours as needed for wheezing or shortness of breath.    Historical Provider, MD  allopurinol (ZYLOPRIM) 100 MG tablet TAKE (2) TABLETS BY MOUTH ONCE DAILY. 08/07/13   Mikey Kirschner, MD  alum & mag hydroxide-simeth [facility]) 200-200-20 MG/5ML suspension Take by mouth every 4 (four) hours as needed for indigestion or heartburn.    Historical Provider, MD  ASPIRIN LOW DOSE 81 MG EC tablet TAKE 1 TABLET BY MOUTH ONCE DAILY. 10/25/13   Mikey Kirschner, MD  Cranberry 475 MG CAPS Take 1 capsule by mouth every 12 (twelve) hours.    Historical Provider, MD  diphenhydrAMINE (BENADRYL) 25 MG tablet Take 25 mg by mouth every 6 (six) hours as needed for itching.    Historical Provider,  MD  fenofibrate 54 MG tablet Take 1 tablet (54 mg total) by mouth daily. 12/14/13   Radene Gunning, NP  guaifenesin (TUSSIN) 100 MG/5ML syrup Take 200 mg by mouth every 6 (six) hours as needed for cough.    Historical Provider, MD  hydrALAZINE (APRESOLINE) 25 MG tablet  12/15/13   Historical Provider, MD  HydrOXYzine HCl (ATARAX PO) Take 12.5 mg by mouth 4 (four) times daily as needed.    Historical Provider, MD  ipratropium (ATROVENT) 0.02 % nebulizer solution  12/15/13   Historical Provider, MD  metoprolol (LOPRESSOR) 50 MG tablet TAKE 1 TABLET BY MOUTH TWICE DAILY. 11/08/13   Mikey Kirschner, MD  NOVOLOG 100 UNIT/ML injection  01/24/14   Historical Provider, MD  omeprazole (PRILOSEC) 20 MG capsule Take 20 mg by mouth daily.    Historical Provider, MD   oxybutynin (DITROPAN-XL) 5 MG 24 hr tablet Take 1 tablet (5 mg total) by mouth at bedtime. 09/06/13   Mikey Kirschner, MD  pantoprazole (PROTONIX) 40 MG tablet  12/02/13   Historical Provider, MD  PARoxetine (PAXIL) 20 MG tablet Take 20 mg by mouth every morning.    Historical Provider, MD  Polyethyl Glycol-Propyl Glycol (SYSTANE OP) Place 1 drop into both eyes 2 (two) times daily.    Historical Provider, MD  potassium chloride SA (K-DUR,KLOR-CON) 20 MEQ tablet  11/22/13   Historical Provider, MD  PROAIR HFA 108 (90 BASE) MCG/ACT inhaler INHALE 2 PUFFS INTO LUNGS EVERY 6 HOURS AS NEEDED FOR WHEEZING. 10/27/13   Mikey Kirschner, MD  simvastatin (ZOCOR) 20 MG tablet TAKE 1 TABLET BY MOUTH AT BEDTIME. 11/08/13   Mikey Kirschner, MD  Spacer/Aero-Holding Chambers (AEROCHAMBER MV) inhaler Use as instructed. 2 puffs BID 09/28/13   Mikey Kirschner, MD  torsemide (DEMADEX) 20 MG tablet Take 80 mg by mouth daily. Take 4 tablets to equal 80 mg total daily    Historical Provider, MD  triamcinolone cream (KENALOG) 0.1 %  01/14/14   Historical Provider, MD   BP 178/57  Pulse 76  Resp 24  Ht 5\' 7"  (1.702 m)  Wt 220 lb (99.791 kg)  BMI 34.45 kg/m2  SpO2 100% Physical Exam  Nursing note and vitals reviewed.  80 year old female, resting comfortably and in no acute distress. Vital signs are significant for tachypnea and hypertension. Oxygen saturation is 100%, which is normal. Head is normocephalic and atraumatic. PERRLA, EOMI. Oropharynx is clear. Neck is nontender and supple without adenopathy or JVD. Back is nontender and there is no CVA tenderness. Lungs have diffuse inspiratory and expiratory wheezes. There are no rales or rhonchi. Chest is nontender. Heart has regular rate and rhythm without murmur. Abdomen is soft, flat, nontender without masses or hepatosplenomegaly and peristalsis is normoactive. Extremities have 2+ edema, full range of motion is present. Skin is warm and dry without  rash. Neurologic: She is awake and alert, cranial nerves are intact, there are no motor or sensory deficits.  ED Course  Procedures (including critical care time) Labs Review Results for orders placed during the hospital encounter of 04/14/14  CBC WITH DIFFERENTIAL      Result Value Ref Range   WBC 12.2 (*) 4.0 - 10.5 K/uL   RBC 3.87  3.87 - 5.11 MIL/uL   Hemoglobin 11.1 (*) 12.0 - 15.0 g/dL   HCT 34.8 (*) 36.0 - 46.0 %   MCV 89.9  78.0 - 100.0 fL   MCH 28.7  26.0 - 34.0 pg  MCHC 31.9  30.0 - 36.0 g/dL   RDW 15.5  11.5 - 15.5 %   Platelets 182  150 - 400 K/uL   Neutrophils Relative % 67  43 - 77 %   Neutro Abs 8.2 (*) 1.7 - 7.7 K/uL   Lymphocytes Relative 17  12 - 46 %   Lymphs Abs 2.1  0.7 - 4.0 K/uL   Monocytes Relative 9  3 - 12 %   Monocytes Absolute 1.1 (*) 0.1 - 1.0 K/uL   Eosinophils Relative 7 (*) 0 - 5 %   Eosinophils Absolute 0.8 (*) 0.0 - 0.7 K/uL   Basophils Relative 0  0 - 1 %   Basophils Absolute 0.1  0.0 - 0.1 K/uL  BASIC METABOLIC PANEL      Result Value Ref Range   Sodium 139  137 - 147 mEq/L   Potassium 4.2  3.7 - 5.3 mEq/L   Chloride 97  96 - 112 mEq/L   CO2 28  19 - 32 mEq/L   Glucose, Bld 158 (*) 70 - 99 mg/dL   BUN 55 (*) 6 - 23 mg/dL   Creatinine, Ser 2.05 (*) 0.50 - 1.10 mg/dL   Calcium 9.2  8.4 - 10.5 mg/dL   GFR calc non Af Amer 20 (*) >90 mL/min   GFR calc Af Amer 24 (*) >90 mL/min   Anion gap 14  5 - 15  PRO B NATRIURETIC PEPTIDE      Result Value Ref Range   Pro B Natriuretic peptide (BNP) 2691.0 (*) 0 - 450 pg/mL  TROPONIN I      Result Value Ref Range   Troponin I <0.30  <0.30 ng/mL  URINALYSIS, ROUTINE W REFLEX MICROSCOPIC      Result Value Ref Range   Color, Urine YELLOW  YELLOW   APPearance CLEAR  CLEAR   Specific Gravity, Urine 1.010  1.005 - 1.030   pH 5.5  5.0 - 8.0   Glucose, UA NEGATIVE  NEGATIVE mg/dL   Hgb urine dipstick MODERATE (*) NEGATIVE   Bilirubin Urine NEGATIVE  NEGATIVE   Ketones, ur NEGATIVE  NEGATIVE mg/dL    Protein, ur NEGATIVE  NEGATIVE mg/dL   Urobilinogen, UA 0.2  0.0 - 1.0 mg/dL   Nitrite NEGATIVE  NEGATIVE   Leukocytes, UA SMALL (*) NEGATIVE  URINE MICROSCOPIC-ADD ON      Result Value Ref Range   Squamous Epithelial / LPF FEW (*) RARE   WBC, UA 3-6  <3 WBC/hpf   RBC / HPF 0-2  <3 RBC/hpf   Bacteria, UA FEW (*) RARE   Imaging Review Dg Chest 1 View  04/13/2014   CLINICAL DATA:  Wheezing  EXAM: CHEST - 1 VIEW  COMPARISON:  February 02, 2014  FINDINGS: There is cardiomegaly with pulmonary venous hypertension. No edema or consolidation. There is mild bilateral lower lobe atelectatic change. There are scattered small calcified granulomas as well as hilar and azygos region calcified lymph nodes. No frank adenopathy.  IMPRESSION: Evidence of volume overload with cardiomegaly and pulmonary venous hypertension. No frank edema or consolidation. Evidence of prior granulomatous disease. Mild bibasilar atelectatic change.   Electronically Signed   By: Lowella Grip M.D.   On: 04/13/2014 15:10   Dg Chest Port 1 View  04/15/2014   CLINICAL DATA:  Shortness of breath.  History breast cancer.  EXAM: PORTABLE CHEST - 1 VIEW  COMPARISON:  Chest x-ray 04/13/2014.  FINDINGS: Mild cephalization of the pulmonary vasculature with indistinct interstitial  markings. Chronic scarring in the left mid lung. Numerous calcified bilateral hilar and mediastinal lymph nodes. No consolidative airspace disease. No pleural effusions. Mild cardiomegaly. Upper mediastinal contours are within normal limits.  IMPRESSION: 1. Findings suggestive of mild congestive heart failure. 2. Extensive calcified mediastinal and hilar lymphadenopathy, similar to prior examinations.   Electronically Signed   By: Vinnie Langton M.D.   On: 04/15/2014 01:31     EKG Interpretation   Date/Time:  Thursday April 14 2014 23:54:26 EDT Ventricular Rate:  75 PR Interval:  45 QRS Duration: 106 QT Interval:  505 QTC Calculation: 564 R Axis:   62 Text  Interpretation:  Sinus rhythm Short PR interval Biatrial enlargement  Borderline repolarization abnormality Minimal ST elevation, anterior leads  Prolonged QT interval When compared with ECG of 12/10/2013, QT has  lengthened Confirmed by [facility]  MD, Karrah Mangini ([id number]) on 04/15/2014 12:54:08 AM      CRITICAL CARE Performed by: UMPNT,IRWER Total critical care time: 45 minutes Critical care time was exclusive of separately billable procedures and treating other patients. Critical care was necessary to treat or prevent imminent or life-threatening deterioration. Critical care was time spent personally by me on the following activities: development of treatment plan with patient and/or surrogate as well as nursing, discussions with consultants, evaluation of patient's response to treatment, examination of patient, obtaining history from patient or surrogate, ordering and performing treatments and interventions, ordering and review of laboratory studies, ordering and review of radiographic studies, pulse oximetry and re-evaluation of patient's condition.  MDM   Final diagnoses:  COPD exacerbation  CHF exacerbation  Renal insufficiency    Dyspnea which is likely multifactorial. She shows signs of congestive heart failure and COPD. Chest x-ray will be obtained to look for pneumonia and also evaluate for possible CHF. Beta natruretic protein level will also be checked. Her records are reviewed and she had been admitted to the hospital 4 months ago with similar presentation. Chest x-ray done yesterday showed evidence of volume overload, so she is given a dose of furosemide. She is given a nebulizer treatment with albuterol and ipratropium and a dose of methylprednisolone.  She is working harder to breathe following nebulizer treatment and she is started on BiPAP. She apparently has been on 80 mg of Demadex twice a day since yesterday, so he is given additional furosemide. Family have arrived and confirmed that  presentation tonight is similar to what she had prior to being admitted to the hospital in April.  She is much more comfortable after above noted treatment although she still has some wheezing and was given additional albuterol with ipratropium. Family member who has power of attorney has arrived and confirms that the patient is DO NOT RESUSCITATE, DO NOT INTUBATE. Case is discussed with Dr. Nehemiah Settle outside hospitals who agrees to admit the patient.  Delora Fuel, MD 15/40/08 [id number]

## 2014-04-14 NOTE — Progress Notes (Addendum)
Patient ID: [name], female   DOB: [birth year], 80 y.o.   MRN: [id number]           PROGRESS NOTE  DATE: 04/13/2014        FACILITY:  [facility]  LEVEL OF CARE: SNF (31)  Acute Visit  CHIEF COMPLAINT:  Manage cough.    HISTORY OF PRESENT ILLNESS: I was requested by the staff to assess the patient regarding above problem(s):  Staff report that patient has been having cough and wheezing.  Per patient, it is a mildly productive cough going on for a couple of days.  She denies shortness of breath, fever, chills or night sweats.  She has a history of COPD.     PAST MEDICAL HISTORY : Reviewed.  No changes/see problem list  CURRENT MEDICATIONS: Reviewed per MAR/see medication list  REVIEW OF SYSTEMS:  GENERAL: no change in appetite, no fatigue, no weight changes, no fever, chills or weakness RESPIRATORY: cough; no SOB, DOE,, wheezing, hemoptysis CARDIAC: no chest pain or palpitations; lower extremity swelling     GI: no abdominal pain, diarrhea, constipation, heart burn, nausea or vomiting  PHYSICAL EXAMINATION  VS: see VS section  GENERAL: no acute distress, moderately obese body habitus EYES: Normal sclerae, normal conjunctivae, no discharge NECK: supple, trachea midline, no neck masses, no thyroid tenderness, no thyromegaly LYMPHATICS: no LAN in the neck, no supraclavicular LAN RESPIRATORY: breathing is even & unlabored, BS CTAB CARDIAC: RRR, no murmur,no extra heart sounds, +3 bilateral lower extremity edema      GI: abdomen soft, normal BS, no masses, no tenderness, no hepatomegaly, no splenomegaly PSYCHIATRIC: Patient is an alert, oriented to person, affect and behavior appropriate  RADIOLOGY:  8-12 CXR: shows volume overload  ASSESSMENT/PLAN:        CHF-new problem.  Increase demadex to 80mg  bid x 3days, then decrease to 80mg  qd.  Cough.  New problem.  Start Robitussin 10 mL t.i.d. for one week, DuoNebs q.4 p.r.n., chest x-ray does not show an  infiltrate.    Renovascular hypertension.   Last blood pressure is elevated, but it could be secondary to the acute symptoms.  Therefore, monitor for now.    CPT CODE: 62952          Lovene Maret Y Luis Nickles, [place] [phone]

## 2014-04-14 NOTE — Addendum Note (Signed)
Addended by: Merlene Laughter on: 04/14/2014 08:38 PM   Modules accepted: Level of Service

## 2014-04-14 NOTE — ED Notes (Signed)
Per EMS, patient is a resident at [facility].  Patient has had shortness of breath all day and getting worse as the night progressed.  Patient given Albuterol neb en route by EMS.

## 2014-04-15 ENCOUNTER — Encounter (HOSPITAL_COMMUNITY): Payer: Self-pay | Admitting: Family Medicine

## 2014-04-15 ENCOUNTER — Emergency Department (HOSPITAL_COMMUNITY): Payer: Medicare Other

## 2014-04-15 DIAGNOSIS — I509 Heart failure, unspecified: Secondary | ICD-10-CM

## 2014-04-15 DIAGNOSIS — J441 Chronic obstructive pulmonary disease with (acute) exacerbation: Secondary | ICD-10-CM | POA: Diagnosis present

## 2014-04-15 DIAGNOSIS — Z853 Personal history of malignant neoplasm of breast: Secondary | ICD-10-CM | POA: Diagnosis not present

## 2014-04-15 DIAGNOSIS — I872 Venous insufficiency (chronic) (peripheral): Secondary | ICD-10-CM | POA: Diagnosis present

## 2014-04-15 DIAGNOSIS — I5033 Acute on chronic diastolic (congestive) heart failure: Secondary | ICD-10-CM | POA: Diagnosis present

## 2014-04-15 DIAGNOSIS — E119 Type 2 diabetes mellitus without complications: Secondary | ICD-10-CM | POA: Diagnosis present

## 2014-04-15 DIAGNOSIS — J9600 Acute respiratory failure, unspecified whether with hypoxia or hypercapnia: Secondary | ICD-10-CM

## 2014-04-15 DIAGNOSIS — T380X5A Adverse effect of glucocorticoids and synthetic analogues, initial encounter: Secondary | ICD-10-CM | POA: Diagnosis present

## 2014-04-15 DIAGNOSIS — Z79899 Other long term (current) drug therapy: Secondary | ICD-10-CM | POA: Diagnosis not present

## 2014-04-15 DIAGNOSIS — Z66 Do not resuscitate: Secondary | ICD-10-CM | POA: Diagnosis present

## 2014-04-15 DIAGNOSIS — M129 Arthropathy, unspecified: Secondary | ICD-10-CM | POA: Diagnosis present

## 2014-04-15 DIAGNOSIS — Z96649 Presence of unspecified artificial hip joint: Secondary | ICD-10-CM | POA: Diagnosis not present

## 2014-04-15 DIAGNOSIS — Z833 Family history of diabetes mellitus: Secondary | ICD-10-CM | POA: Diagnosis not present

## 2014-04-15 DIAGNOSIS — R0602 Shortness of breath: Secondary | ICD-10-CM | POA: Diagnosis present

## 2014-04-15 DIAGNOSIS — J45901 Unspecified asthma with (acute) exacerbation: Secondary | ICD-10-CM | POA: Diagnosis present

## 2014-04-15 DIAGNOSIS — Z96659 Presence of unspecified artificial knee joint: Secondary | ICD-10-CM | POA: Diagnosis not present

## 2014-04-15 DIAGNOSIS — K219 Gastro-esophageal reflux disease without esophagitis: Secondary | ICD-10-CM | POA: Diagnosis present

## 2014-04-15 DIAGNOSIS — I129 Hypertensive chronic kidney disease with stage 1 through stage 4 chronic kidney disease, or unspecified chronic kidney disease: Secondary | ICD-10-CM | POA: Diagnosis present

## 2014-04-15 DIAGNOSIS — R1310 Dysphagia, unspecified: Secondary | ICD-10-CM | POA: Diagnosis present

## 2014-04-15 DIAGNOSIS — E7800 Pure hypercholesterolemia, unspecified: Secondary | ICD-10-CM | POA: Diagnosis present

## 2014-04-15 DIAGNOSIS — J9601 Acute respiratory failure with hypoxia: Secondary | ICD-10-CM | POA: Diagnosis present

## 2014-04-15 DIAGNOSIS — I2789 Other specified pulmonary heart diseases: Secondary | ICD-10-CM | POA: Diagnosis present

## 2014-04-15 DIAGNOSIS — N289 Disorder of kidney and ureter, unspecified: Secondary | ICD-10-CM | POA: Diagnosis present

## 2014-04-15 DIAGNOSIS — F0390 Unspecified dementia without behavioral disturbance: Secondary | ICD-10-CM | POA: Diagnosis present

## 2014-04-15 DIAGNOSIS — Z825 Family history of asthma and other chronic lower respiratory diseases: Secondary | ICD-10-CM | POA: Diagnosis not present

## 2014-04-15 DIAGNOSIS — Z7982 Long term (current) use of aspirin: Secondary | ICD-10-CM | POA: Diagnosis not present

## 2014-04-15 DIAGNOSIS — N184 Chronic kidney disease, stage 4 (severe): Secondary | ICD-10-CM | POA: Diagnosis present

## 2014-04-15 LAB — CBC WITH DIFFERENTIAL/PLATELET
Basophils Absolute: 0 10*3/uL (ref 0.0–0.1)
Basophils Absolute: 0.1 10*3/uL (ref 0.0–0.1)
Basophils Relative: 0 % (ref 0–1)
Basophils Relative: 0 % (ref 0–1)
EOS ABS: 0.2 10*3/uL (ref 0.0–0.7)
EOS ABS: 0.8 10*3/uL — AB (ref 0.0–0.7)
EOS PCT: 2 % (ref 0–5)
Eosinophils Relative: 7 % — ABNORMAL HIGH (ref 0–5)
HCT: 34.8 % — ABNORMAL LOW (ref 36.0–46.0)
HCT: 34.8 % — ABNORMAL LOW (ref 36.0–46.0)
Hemoglobin: 10.9 g/dL — ABNORMAL LOW (ref 12.0–15.0)
Hemoglobin: 11.1 g/dL — ABNORMAL LOW (ref 12.0–15.0)
LYMPHS ABS: 2.1 10*3/uL (ref 0.7–4.0)
LYMPHS PCT: 7 % — AB (ref 12–46)
Lymphocytes Relative: 17 % (ref 12–46)
Lymphs Abs: 0.7 10*3/uL (ref 0.7–4.0)
MCH: 28.3 pg (ref 26.0–34.0)
MCH: 28.7 pg (ref 26.0–34.0)
MCHC: 31.3 g/dL (ref 30.0–36.0)
MCHC: 31.9 g/dL (ref 30.0–36.0)
MCV: 89.9 fL (ref 78.0–100.0)
MCV: 90.4 fL (ref 78.0–100.0)
Monocytes Absolute: 0.2 10*3/uL (ref 0.1–1.0)
Monocytes Absolute: 1.1 10*3/uL — ABNORMAL HIGH (ref 0.1–1.0)
Monocytes Relative: 2 % — ABNORMAL LOW (ref 3–12)
Monocytes Relative: 9 % (ref 3–12)
Neutro Abs: 8.2 10*3/uL — ABNORMAL HIGH (ref 1.7–7.7)
Neutro Abs: 8.8 10*3/uL — ABNORMAL HIGH (ref 1.7–7.7)
Neutrophils Relative %: 67 % (ref 43–77)
Neutrophils Relative %: 89 % — ABNORMAL HIGH (ref 43–77)
PLATELETS: 165 10*3/uL (ref 150–400)
Platelets: 182 10*3/uL (ref 150–400)
RBC: 3.85 MIL/uL — ABNORMAL LOW (ref 3.87–5.11)
RBC: 3.87 MIL/uL (ref 3.87–5.11)
RDW: 15.5 % (ref 11.5–15.5)
RDW: 15.5 % (ref 11.5–15.5)
WBC: 12.2 10*3/uL — AB (ref 4.0–10.5)
WBC: 9.9 10*3/uL (ref 4.0–10.5)

## 2014-04-15 LAB — COMPREHENSIVE METABOLIC PANEL
ALT: 11 U/L (ref 0–35)
AST: 28 U/L (ref 0–37)
Albumin: 3.7 g/dL (ref 3.5–5.2)
Alkaline Phosphatase: 82 U/L (ref 39–117)
Anion gap: 19 — ABNORMAL HIGH (ref 5–15)
BUN: 57 mg/dL — ABNORMAL HIGH (ref 6–23)
CO2: 25 meq/L (ref 19–32)
CREATININE: 2.02 mg/dL — AB (ref 0.50–1.10)
Calcium: 9.3 mg/dL (ref 8.4–10.5)
Chloride: 97 mEq/L (ref 96–112)
GFR calc non Af Amer: 21 mL/min — ABNORMAL LOW (ref >90)
GFR, EST AFRICAN AMERICAN: 24 mL/min — AB (ref >90)
Glucose, Bld: 224 mg/dL — ABNORMAL HIGH (ref 70–99)
Potassium: 4.1 mEq/L (ref 3.7–5.3)
SODIUM: 141 meq/L (ref 137–147)
Total Bilirubin: 0.5 mg/dL (ref 0.3–1.2)
Total Protein: 7.5 g/dL (ref 6.0–8.3)

## 2014-04-15 LAB — BASIC METABOLIC PANEL
Anion gap: 14 (ref 5–15)
BUN: 55 mg/dL — ABNORMAL HIGH (ref 6–23)
CO2: 28 mEq/L (ref 19–32)
Calcium: 9.2 mg/dL (ref 8.4–10.5)
Chloride: 97 mEq/L (ref 96–112)
Creatinine, Ser: 2.05 mg/dL — ABNORMAL HIGH (ref 0.50–1.10)
GFR calc non Af Amer: 20 mL/min — ABNORMAL LOW (ref >90)
GFR, EST AFRICAN AMERICAN: 24 mL/min — AB (ref >90)
GLUCOSE: 158 mg/dL — AB (ref 70–99)
Potassium: 4.2 mEq/L (ref 3.7–5.3)
SODIUM: 139 meq/L (ref 137–147)

## 2014-04-15 LAB — BLOOD GAS, ARTERIAL
ACID-BASE DEFICIT: 2.4 mmol/L — AB (ref 0.0–2.0)
ACID-BASE EXCESS: 2.8 mmol/L — AB (ref 0.0–2.0)
Bicarbonate: 27.5 mEq/L — ABNORMAL HIGH (ref 20.0–24.0)
DRAWN BY: 22223
O2 CONTENT: 2 L/min
O2 Saturation: 94.8 %
PCO2 ART: 47.9 mmHg — AB (ref 35.0–45.0)
TCO2: 25.5 mmol/L (ref 0–100)
pH, Arterial: 7.377 (ref 7.350–7.450)
pO2, Arterial: 76.2 mmHg — ABNORMAL LOW (ref 80.0–100.0)

## 2014-04-15 LAB — URINALYSIS, ROUTINE W REFLEX MICROSCOPIC
BILIRUBIN URINE: NEGATIVE
Glucose, UA: NEGATIVE mg/dL
KETONES UR: NEGATIVE mg/dL
Nitrite: NEGATIVE
Protein, ur: NEGATIVE mg/dL
Specific Gravity, Urine: 1.01 (ref 1.005–1.030)
UROBILINOGEN UA: 0.2 mg/dL (ref 0.0–1.0)
pH: 5.5 (ref 5.0–8.0)

## 2014-04-15 LAB — TROPONIN I

## 2014-04-15 LAB — URINE MICROSCOPIC-ADD ON

## 2014-04-15 LAB — GLUCOSE, CAPILLARY
GLUCOSE-CAPILLARY: 256 mg/dL — AB (ref 70–99)
Glucose-Capillary: 265 mg/dL — ABNORMAL HIGH (ref 70–99)
Glucose-Capillary: 223 mg/dL — ABNORMAL HIGH (ref 70–99)

## 2014-04-15 LAB — PRO B NATRIURETIC PEPTIDE: PRO B NATRI PEPTIDE: 2691 pg/mL — AB (ref 0–450)

## 2014-04-15 LAB — MAGNESIUM: Magnesium: 2.2 mg/dL (ref 1.5–2.5)

## 2014-04-15 LAB — MRSA PCR SCREENING: MRSA by PCR: NEGATIVE

## 2014-04-15 MED ORDER — ALLOPURINOL 100 MG PO TABS
200.0000 mg | ORAL_TABLET | Freq: Every day | ORAL | Status: DC
  Administered 2014-04-15 – 2014-04-20 (×6): 200 mg via ORAL
  Filled 2014-04-15 (×6): qty 2

## 2014-04-15 MED ORDER — IPRATROPIUM-ALBUTEROL 0.5-2.5 (3) MG/3ML IN SOLN
3.0000 mL | Freq: Once | RESPIRATORY_TRACT | Status: AC
Start: 2014-04-15 — End: 2014-04-15
  Administered 2014-04-15: 3 mL via RESPIRATORY_TRACT
  Filled 2014-04-15: qty 3

## 2014-04-15 MED ORDER — FUROSEMIDE 10 MG/ML IJ SOLN
40.0000 mg | Freq: Once | INTRAMUSCULAR | Status: AC
  Administered 2014-04-15: 40 mg via INTRAVENOUS
  Filled 2014-04-15: qty 4

## 2014-04-15 MED ORDER — ALBUTEROL SULFATE (2.5 MG/3ML) 0.083% IN NEBU
2.5000 mg | INHALATION_SOLUTION | RESPIRATORY_TRACT | Status: DC | PRN
  Administered 2014-04-15 – 2014-04-20 (×6): 2.5 mg via RESPIRATORY_TRACT
  Filled 2014-04-15 (×6): qty 3

## 2014-04-15 MED ORDER — SIMVASTATIN 20 MG PO TABS
20.0000 mg | ORAL_TABLET | Freq: Every day | ORAL | Status: DC
  Administered 2014-04-16 – 2014-04-19 (×4): 20 mg via ORAL
  Filled 2014-04-15 (×4): qty 1

## 2014-04-15 MED ORDER — ACETAMINOPHEN 325 MG PO TABS: 650.0000 mg | ORAL_TABLET | Freq: Four times a day (QID) | ORAL | Status: DC | PRN

## 2014-04-15 MED ORDER — POTASSIUM CHLORIDE CRYS ER 20 MEQ PO TBCR
20.0000 meq | EXTENDED_RELEASE_TABLET | Freq: Two times a day (BID) | ORAL | Status: DC
  Administered 2014-04-15 – 2014-04-17 (×6): 20 meq via ORAL
  Filled 2014-04-15 (×6): qty 1

## 2014-04-15 MED ORDER — ACETAMINOPHEN 650 MG RE SUPP: 650.0000 mg | Freq: Four times a day (QID) | RECTAL | Status: DC | PRN

## 2014-04-15 MED ORDER — POLYETHYL GLYCOL-PROPYL GLYCOL 0.4-0.3 % OP SOLN: 1.0000 [drp] | Freq: Two times a day (BID) | OPHTHALMIC | Status: DC

## 2014-04-15 MED ORDER — ONDANSETRON HCL 4 MG PO TABS: 4.0000 mg | ORAL_TABLET | Freq: Four times a day (QID) | ORAL | Status: DC | PRN

## 2014-04-15 MED ORDER — DEXTROSE 5 % IV SOLN
INTRAVENOUS | Status: AC
  Filled 2014-04-15: qty 500

## 2014-04-15 MED ORDER — CHLORHEXIDINE GLUCONATE 0.12 % MT SOLN
15.0000 mL | Freq: Two times a day (BID) | OROMUCOSAL | Status: DC
  Administered 2014-04-15 – 2014-04-20 (×10): 15 mL via OROMUCOSAL
  Filled 2014-04-15 (×10): qty 15

## 2014-04-15 MED ORDER — ASPIRIN EC 81 MG PO TBEC
81.0000 mg | DELAYED_RELEASE_TABLET | Freq: Every day | ORAL | Status: DC
  Administered 2014-04-15 – 2014-04-20 (×6): 81 mg via ORAL
  Filled 2014-04-15 (×10): qty 1

## 2014-04-15 MED ORDER — METOPROLOL TARTRATE 50 MG PO TABS
50.0000 mg | ORAL_TABLET | Freq: Two times a day (BID) | ORAL | Status: DC
  Administered 2014-04-15 (×3): 50 mg via ORAL
  Filled 2014-04-15 (×3): qty 1

## 2014-04-15 MED ORDER — FUROSEMIDE 10 MG/ML IJ SOLN
60.0000 mg | Freq: Two times a day (BID) | INTRAMUSCULAR | Status: DC
  Administered 2014-04-15 – 2014-04-18 (×7): 60 mg via INTRAVENOUS
  Filled 2014-04-15 (×8): qty 6

## 2014-04-15 MED ORDER — GUAIFENESIN ER 600 MG PO TB12
1200.0000 mg | ORAL_TABLET | Freq: Two times a day (BID) | ORAL | Status: DC
  Administered 2014-04-15 – 2014-04-20 (×11): 1200 mg via ORAL
  Filled 2014-04-15 (×11): qty 2

## 2014-04-15 MED ORDER — POLYVINYL ALCOHOL 1.4 % OP SOLN
1.0000 [drp] | Freq: Two times a day (BID) | OPHTHALMIC | Status: DC
  Administered 2014-04-15 – 2014-04-20 (×10): 1 [drp] via OPHTHALMIC
  Filled 2014-04-15 (×2): qty 15

## 2014-04-15 MED ORDER — METHYLPREDNISOLONE SODIUM SUCC 125 MG IJ SOLR
60.0000 mg | Freq: Four times a day (QID) | INTRAMUSCULAR | Status: DC
  Administered 2014-04-15 – 2014-04-17 (×9): 60 mg via INTRAVENOUS
  Filled 2014-04-15 (×9): qty 2

## 2014-04-15 MED ORDER — METHYLPREDNISOLONE SODIUM SUCC 125 MG IJ SOLR
125.0000 mg | Freq: Once | INTRAMUSCULAR | Status: AC
  Administered 2014-04-15: 125 mg via INTRAVENOUS
  Filled 2014-04-15: qty 2

## 2014-04-15 MED ORDER — INSULIN ASPART 100 UNIT/ML ~~LOC~~ SOLN
0.0000 [IU] | Freq: Every day | SUBCUTANEOUS | Status: DC
  Administered 2014-04-15: 3 [IU] via SUBCUTANEOUS

## 2014-04-15 MED ORDER — DEXTROSE 5 % IV SOLN
500.0000 mg | INTRAVENOUS | Status: DC
  Administered 2014-04-15 – 2014-04-18 (×4): 500 mg via INTRAVENOUS
  Filled 2014-04-15 (×5): qty 500

## 2014-04-15 MED ORDER — INSULIN ASPART 100 UNIT/ML ~~LOC~~ SOLN
0.0000 [IU] | Freq: Three times a day (TID) | SUBCUTANEOUS | Status: DC
  Administered 2014-04-15: 8 [IU] via SUBCUTANEOUS
  Administered 2014-04-15 – 2014-04-16 (×4): 5 [IU] via SUBCUTANEOUS

## 2014-04-15 MED ORDER — DOCUSATE SODIUM 100 MG PO CAPS: 100.0000 mg | ORAL_CAPSULE | Freq: Every day | ORAL | Status: DC | PRN

## 2014-04-15 MED ORDER — TORSEMIDE 20 MG PO TABS
80.0000 mg | ORAL_TABLET | Freq: Two times a day (BID) | ORAL | Status: DC
  Administered 2014-04-15: 80 mg via ORAL
  Filled 2014-04-15: qty 4

## 2014-04-15 MED ORDER — METHYLPREDNISOLONE SODIUM SUCC 125 MG IJ SOLR: 60.0000 mg | Freq: Two times a day (BID) | INTRAMUSCULAR | Status: DC

## 2014-04-15 MED ORDER — IPRATROPIUM-ALBUTEROL 0.5-2.5 (3) MG/3ML IN SOLN
3.0000 mL | Freq: Four times a day (QID) | RESPIRATORY_TRACT | Status: DC
  Administered 2014-04-15 – 2014-04-19 (×19): 3 mL via RESPIRATORY_TRACT
  Filled 2014-04-15 (×19): qty 3

## 2014-04-15 MED ORDER — GUAIFENESIN-DM 100-10 MG/5ML PO SYRP
5.0000 mL | ORAL_SOLUTION | ORAL | Status: DC | PRN
  Administered 2014-04-16 – 2014-04-20 (×5): 5 mL via ORAL
  Filled 2014-04-15 (×6): qty 5

## 2014-04-15 MED ORDER — ENOXAPARIN SODIUM 30 MG/0.3ML ~~LOC~~ SOLN
30.0000 mg | SUBCUTANEOUS | Status: DC
  Administered 2014-04-15 – 2014-04-20 (×6): 30 mg via SUBCUTANEOUS
  Filled 2014-04-15 (×6): qty 0.3

## 2014-04-15 MED ORDER — SODIUM CHLORIDE 0.9 % IJ SOLN: 3.0000 mL | INTRAMUSCULAR | Status: DC | PRN

## 2014-04-15 MED ORDER — PANTOPRAZOLE SODIUM 40 MG PO TBEC
40.0000 mg | DELAYED_RELEASE_TABLET | Freq: Every day | ORAL | Status: DC
  Administered 2014-04-15 – 2014-04-17 (×3): 40 mg via ORAL
  Filled 2014-04-15 (×3): qty 1

## 2014-04-15 MED ORDER — ONDANSETRON HCL 4 MG/2ML IJ SOLN: 4.0000 mg | Freq: Four times a day (QID) | INTRAMUSCULAR | Status: DC | PRN

## 2014-04-15 MED ORDER — FENOFIBRATE 54 MG PO TABS
54.0000 mg | ORAL_TABLET | Freq: Every day | ORAL | Status: DC
  Administered 2014-04-15 – 2014-04-19 (×5): 54 mg via ORAL
  Filled 2014-04-15 (×8): qty 1

## 2014-04-15 MED ORDER — PAROXETINE HCL 20 MG PO TABS
20.0000 mg | ORAL_TABLET | ORAL | Status: DC
  Administered 2014-04-15 – 2014-04-20 (×6): 20 mg via ORAL
  Filled 2014-04-15 (×6): qty 1

## 2014-04-15 MED ORDER — GUAIFENESIN 100 MG/5ML PO SOLN
200.0000 mg | ORAL | Status: DC | PRN
  Filled 2014-04-15: qty 10

## 2014-04-15 MED ORDER — CETYLPYRIDINIUM CHLORIDE 0.05 % MT LIQD
7.0000 mL | Freq: Two times a day (BID) | OROMUCOSAL | Status: DC
  Administered 2014-04-15 – 2014-04-18 (×8): 7 mL via OROMUCOSAL

## 2014-04-15 MED ORDER — SODIUM CHLORIDE 0.9 % IJ SOLN
10.0000 mL | INTRAMUSCULAR | Status: DC | PRN
Start: 2014-04-15 — End: 2014-04-20
  Administered 2014-04-15: 10 mL via INTRAVENOUS

## 2014-04-15 MED ORDER — ALUM & MAG HYDROXIDE-SIMETH 200-200-20 MG/5ML PO SUSP
30.0000 mL | ORAL | Status: DC | PRN
  Administered 2014-04-16: 30 mL via ORAL
  Filled 2014-04-15: qty 30

## 2014-04-15 MED ORDER — RESOURCE THICKENUP CLEAR PO POWD
ORAL | Status: DC | PRN
Start: 2014-04-15 — End: 2014-04-20
  Administered 2014-04-15: 20:00:00 via ORAL
  Filled 2014-04-15: qty 125

## 2014-04-15 NOTE — Clinical Social Work Psychosocial (Signed)
    Clinical Social Work Department BRIEF PSYCHOSOCIAL ASSESSMENT 04/15/2014  Patient:  [name], [name]     Account Number:  [id number]     Admit date:  04/14/2014  Clinical Social Worker:  Edwyna Shell, [place]  Date/Time:  04/15/2014 02:00 PM  Referred by:  CSW  Date Referred:  04/15/2014 Referred for  SNF Placement   Other Referral:   Interview type:  Family Other interview type:   Unable to assess patient due to patient medical condition    PSYCHOSOCIAL DATA Living Status:  FACILITY Admitted from facility:  [place] Level of care:  [place] Primary support name:  Jaclynn Major Primary support relationship to patient:  CHILD, ADULT Degree of support available:   Daughters Jenny Reichmann and Fraser Din are [facility] POAs, very supportive and involved.  Daughter Vaughan Basta has been less involved over past few months and does not make decisions for patient per daughter Jenny Reichmann.    CURRENT CONCERNS Current Concerns  Post-Acute Placement   Other Concerns:    SOCIAL WORK ASSESSMENT / PLAN CSW unable to assess patient directly, patient currently having severe difficulty breathing and undergoing respiratory therapy.  CSW asked patient for verbal permission to speak w daughter in room, patient nodded agreement.  Spoke w daughter, Valrie Hart in room.  Says she and daughter, Leonides Cave, are both [facility] POAs, can assist w decision making if needed.  Daughter Vaughan Basta  has been estranged from family since death of father several months ago - family disagreed on treatments and end of life care. Per daughter, patient depressed because she has little contact w daughter Vaughan Basta; other two daughters are supportive.  Patient's husband died several months ago.    Patient currently resident of [facility].  Was placed there 12/2013 from [facility] after most recent hospitalization.  Was initially on [facility] Medicare rehab days, but is currently unable to make much  progress in PT due to her physical condition.  At present, family is paying privately for SNF.  Family wants patient to return to [facility] at discharge.  Says they have stopped PT for patient - "it wasnt doing any good" per daughter.    CSW left message for [facility] admissions to confirm patient discharge plan, awaiting return call to discuss.   Assessment/plan status:  Psychosocial Support/Ongoing Assessment of Needs Other assessment/ plan:   Information/referral to community resources:   Return to [place]: Daughter expressed that it was difficult to watch patient struggle w breathing, says patient's physical condition has had significant decline in past few days.  Want patient to return to [facility].        Edwyna Shell, LCSW Clinical Social Worker [phone])

## 2014-04-15 NOTE — Progress Notes (Signed)
This patient was admitted to the hospital earlier this morning by Dr. Nehemiah Settle.  Patient seen and examined. She is known to me from previous hospitalization  She has been admitted with worsening shortness of breath, acute respiratory failure with hypoxia, likely multifactorial. She briefly required BiPAP, but is currently on nasal cannula. ABG from this morning appears to be acceptable. She likely has a COPD exacerbation evident by significant wheezing and prolonged expiratory phase. Chest x-ray indicated mild acute on chronic diastolic congestive heart failure. Her BNP is elevated above baseline. She received 2 doses of intravenous Lasix thus far, we will continue her on intravenous Lasix for now. Monitor strict I.'s and O.'s. She is still significantly wheezing, so we will increase her Solu-Medrol to 60 mg every 6 hours. Continue her mucolytics and encourage flutter valve. She has been started on azithromycin for antibiotic coverage.  [name]

## 2014-04-15 NOTE — Progress Notes (Addendum)
Patient easily choked on thin liquids during lunch. Tray removed from patient and MD notified. RN asked family not to give patient any food or liquids until MD responds.

## 2014-04-15 NOTE — Progress Notes (Signed)
Patient given dysphagia III diet with honey thickened liquids for supper. Patient did very well when she was reminded to take small bites and eat slowly. Patient also tolerated honey thickened liquids very well. Patient had minimal coughing during supper, but recovered quickly after taking a break and catching her breath for a moment.

## 2014-04-15 NOTE — ED Notes (Signed)
Patient resting more comfortably at this time.  Respirations are unlabored at this time.

## 2014-04-15 NOTE — Progress Notes (Signed)
UR chart review completed.  

## 2014-04-15 NOTE — H&P (Signed)
PCP:   Cyndee Brightly, MD   Chief Complaint:  Shortness of breath  HPI: This is a 80 year old Caucasian female with a past history including mild dementia, chronic diastolic heart failure, hypertension, diet-controlled diabetes type 2, mitral valve stenosis, chronic kidney disease. The patient is a resident at [facility]. Over the past 2 days the patient has had worsening shortness of breath with dyspnea on exertion, cough, sputum production. The patient has had weight gain and worsening peripheral edema. Her diuretics have been increased, which did not ease her work of breathing. She was brought to the emergency department today because of history work of breathing. She is placed on BiPAP which helped to improve her work of breathing. She had an episode of similar to this this past spring.  Review of Systems:  The patient denies anorexia, fever, weight loss,, vision loss, decreased hearing, hoarseness, chest pain, syncope, dyspnea on exertion, peripheral edema, balance deficits, hemoptysis, abdominal pain, melena, hematochezia, severe indigestion/heartburn, hematuria, incontinence, genital sores, muscle weakness, suspicious skin lesions, transient blindness, difficulty walking, depression, abnormal bleeding, enlarged lymph nodes, angioedema, and breast masses.  Past Medical History: Past Medical History  Diagnosis Date  . Breast cancer   . Essential hypertension, benign   . Depression   . GERD (gastroesophageal reflux disease)   . Hypercholesteremia   . CKD (chronic kidney disease) stage 3, GFR 30-59 ml/min   . Pneumonia   . UTI (lower urinary tract infection)   . Arthritis   . Asthma   . Venous stasis   . Type 2 diabetes mellitus   . Chronic diastolic heart failure     LVEF 70%  . Mitral stenosis     Moderate  . Secondary pulmonary hypertension     PASP 64 mmHg   Past Surgical History  Procedure Laterality Date  . Replacement total knee    . Mastectomy    .  Total hip arthroplasty    . Carotid endarterectomy    . Cataracts      Medications: Prior to Admission medications   Medication Sig Start Date End Date Taking? Authorizing Provider  acetaminophen (TYLENOL) 325 MG tablet Take 650 mg by mouth every 6 (six) hours as needed.   Yes Historical Provider, MD  albuterol (PROVENTIL HFA;VENTOLIN HFA) 108 (90 BASE) MCG/ACT inhaler Inhale 2 puffs into the lungs every 6 (six) hours as needed for wheezing or shortness of breath.   Yes Historical Provider, MD  allopurinol (ZYLOPRIM) 100 MG tablet TAKE (2) TABLETS BY MOUTH ONCE DAILY. 08/07/13  Yes Mikey Kirschner, MD  alum & mag hydroxide-simeth [facility]) 200-200-20 MG/5ML suspension Take by mouth every 4 (four) hours as needed for indigestion or heartburn.   Yes Historical Provider, MD  ASPIRIN LOW DOSE 81 MG EC tablet TAKE 1 TABLET BY MOUTH ONCE DAILY. 10/25/13  Yes Mikey Kirschner, MD  Cranberry 475 MG CAPS Take 1 capsule by mouth every 12 (twelve) hours.   Yes Historical Provider, MD  fenofibrate 54 MG tablet Take 1 tablet (54 mg total) by mouth daily. 12/14/13  Yes Lezlie Octave Black, NP  guaifenesin (TUSSIN) 100 MG/5ML syrup Take 200 mg by mouth every 6 (six) hours as needed for cough.   Yes Historical Provider, MD  hydrALAZINE (APRESOLINE) 25 MG tablet  12/15/13  Yes Historical Provider, MD  HydrOXYzine HCl (ATARAX PO) Take 12.5 mg by mouth 4 (four) times daily as needed.   Yes Historical Provider, MD  ipratropium (ATROVENT) 0.02 % nebulizer solution  12/15/13  Yes Historical Provider, MD  metoprolol (LOPRESSOR) 50 MG tablet TAKE 1 TABLET BY MOUTH TWICE DAILY. 11/08/13  Yes Mikey Kirschner, MD  NOVOLOG 100 UNIT/ML injection  01/24/14  Yes Historical Provider, MD  omeprazole (PRILOSEC) 20 MG capsule Take 20 mg by mouth daily.   Yes Historical Provider, MD  oxybutynin (DITROPAN-XL) 5 MG 24 hr tablet Take 1 tablet (5 mg total) by mouth at bedtime. 09/06/13  Yes Mikey Kirschner, MD  PARoxetine (PAXIL) 20 MG tablet  Take 20 mg by mouth every morning.   Yes Historical Provider, MD  Polyethyl Glycol-Propyl Glycol (SYSTANE OP) Place 1 drop into both eyes 2 (two) times daily.   Yes Historical Provider, MD  PROAIR HFA 108 (90 BASE) MCG/ACT inhaler INHALE 2 PUFFS INTO LUNGS EVERY 6 HOURS AS NEEDED FOR WHEEZING. 10/27/13  Yes Mikey Kirschner, MD  simvastatin (ZOCOR) 20 MG tablet TAKE 1 TABLET BY MOUTH AT BEDTIME. 11/08/13  Yes Mikey Kirschner, MD  Spacer/Aero-Holding Chambers (AEROCHAMBER MV) inhaler Use as instructed. 2 puffs BID 09/28/13  Yes Mikey Kirschner, MD  torsemide (DEMADEX) 20 MG tablet Take 80 mg by mouth daily. Take 4 tablets to equal 80 mg total daily   Yes Historical Provider, MD  diphenhydrAMINE (BENADRYL) 25 MG tablet Take 25 mg by mouth every 6 (six) hours as needed for itching.    Historical Provider, MD  pantoprazole (PROTONIX) 40 MG tablet  12/02/13   Historical Provider, MD  potassium chloride SA (K-DUR,KLOR-CON) 20 MEQ tablet  11/22/13   Historical Provider, MD  triamcinolone cream (KENALOG) 0.1 %  01/14/14   Historical Provider, MD    Allergies:   Allergies  Allergen Reactions  . Carbapenems Other (See Comments)    unknown  . Cephalosporins Other (See Comments)    unknown  . Amoxicillin Rash  . Penicillins Rash    Social History:  reports that she has never smoked. She has never used smokeless tobacco. She reports that she does not drink alcohol or use illicit drugs.  Family History: Family History  Problem Relation Age of Onset  . Asthma Other   . Diabetes Other     Physical Exam: Filed Vitals:   04/15/14 0017 04/15/14 0030 04/15/14 0100 04/15/14 0130  BP:  155/51 131/45 151/50  Pulse:  77 73 74  Resp:  20 17 18   Height:      Weight:      SpO2: 98% 99% 100% 100%   General appearance: alert, cooperative, no distress and on BiPAP Head: Normocephalic, without obvious abnormality, atraumatic Eyes: conjunctivae/corneas clear. PERRL, EOM's intact. Fundi benign. Ears: normal  TM's and external ear canals both ears Neck: no adenopathy, no carotid bruit, no JVD, supple, symmetrical, trachea midline and thyroid not enlarged, symmetric, no tenderness/mass/nodules Resp: diminished breath sounds bilaterally, rales base - bilaterally and wheezes bilaterally Cardio: regular rate and rhythm, S1, S2 normal, no murmur, click, rub or gallop GI: soft, non-tender; bowel sounds normal; no masses,  no organomegaly Extremities: edema 2+ bilaterally Lymph nodes: Cervical, supraclavicular, and axillary nodes normal.   Labs on Admission:   Recent Labs  04/15/14 0025  NA 139  K 4.2  CL 97  CO2 28  GLUCOSE 158*  BUN 55*  CREATININE 2.05*  CALCIUM 9.2   No results found for this basename: AST, ALT, ALKPHOS, BILITOT, PROT, ALBUMIN,  in the last 72 hours No results found for this basename: LIPASE, AMYLASE,  in the last 72 hours  Recent Labs  04/15/14  0025  WBC 12.2*  NEUTROABS 8.2*  HGB 11.1*  HCT 34.8*  MCV 89.9  PLT 182    Recent Labs  04/15/14 0025  TROPONINI <0.30   No results found for this basename: TSH, T4TOTAL, FREET3, T3FREE, THYROIDAB,  in the last 72 hours No results found for this basename: VITAMINB12, FOLATE, FERRITIN, TIBC, IRON, RETICCTPCT,  in the last 72 hours  Radiological Exams on Admission: Dg Chest 1 View  04/13/2014   CLINICAL DATA:  Wheezing  EXAM: CHEST - 1 VIEW  COMPARISON:  February 02, 2014  FINDINGS: There is cardiomegaly with pulmonary venous hypertension. No edema or consolidation. There is mild bilateral lower lobe atelectatic change. There are scattered small calcified granulomas as well as hilar and azygos region calcified lymph nodes. No frank adenopathy.  IMPRESSION: Evidence of volume overload with cardiomegaly and pulmonary venous hypertension. No frank edema or consolidation. Evidence of prior granulomatous disease. Mild bibasilar atelectatic change.   Electronically Signed   By: Lowella Grip M.D.   On: 04/13/2014 15:10   Dg  Chest Port 1 View  04/15/2014   CLINICAL DATA:  Shortness of breath.  History breast cancer.  EXAM: PORTABLE CHEST - 1 VIEW  COMPARISON:  Chest x-ray 04/13/2014.  FINDINGS: Mild cephalization of the pulmonary vasculature with indistinct interstitial markings. Chronic scarring in the left mid lung. Numerous calcified bilateral hilar and mediastinal lymph nodes. No consolidative airspace disease. No pleural effusions. Mild cardiomegaly. Upper mediastinal contours are within normal limits.  IMPRESSION: 1. Findings suggestive of mild congestive heart failure. 2. Extensive calcified mediastinal and hilar lymphadenopathy, similar to prior examinations.   Electronically Signed   By: Vinnie Langton M.D.   On: 04/15/2014 01:31    Assessment/Plan Present on Admission:  . COPD exacerbation . Acute CHF . Acute renal insufficiency . Respiratory failure  #1 COPD exacerbation -Admit to the step down unit as she is on BiPAP.  Continue neb treatments: 2 nebs every 6 hours with albuterol every 2 when necessary. Will also continue her methylprednisolone and we'll start azithromycin due to purulent sputum. We'll obtain blood gas the morning and repeat labs.  #2 Mild CHF, history of diastolic failure. -Will hold off on echo as x-ray shows mild failure.  Based on exam, with majority of patients symptoms are due to COPD exacerbation. Additionally, the etiology of the patient's heart failure is diastolic and evaluation of the LV has been normal as far. Will continue with torsemide 80 mg twice a day to aid in diuresis.  #3 respiratory failure - Continue BiPAP. Will obtain an ABG in the morning.  #4 acute renal insufficiency - We'll continue to monitor creatinine.  #5 diabetes type 2 -  diet controlled   #6 CODE STATUS: DO NOT RESUSCITATE #7 DVT prophylaxis: Lovenox  Greater than 70 minutes was spent on admission of this patient.   Loma Boston, DO 04/15/2014, 2:57 AM

## 2014-04-15 NOTE — Care Management Note (Addendum)
    Page 1 of 1   04/19/2014     3:02:21 PM CARE MANAGEMENT NOTE 04/19/2014  Patient:  [name], [name]   Account Number:  [id number]  Date Initiated:  04/15/2014  Documentation initiated by:  Theophilus Kinds  Subjective/Objective Assessment:   Pt admitted from [facility] with CHF. Pt will return to facility at discharge.     Action/Plan:   CSw is aware and will arrange discharge to facility when medically stable.   Anticipated DC Date:  04/22/2014   Anticipated DC Plan:  SKILLED NURSING FACILITY  In-house referral  Clinical Social Worker      DC Planning Services  CM consult      Choice offered to / List presented to:             Status of service:  Completed, signed off Medicare Important Message given?  YES (If response is "NO", the following Medicare IM given date fields will be blank) Date Medicare IM given:  04/19/2014 Medicare IM given by:  Jolene Provost Date Additional Medicare IM given:   Additional Medicare IM given by:    Discharge Disposition:  [place]  Per UR Regulation:    If discussed at Long Length of Stay Meetings, dates discussed:    Comments:  04/19/2014 [place], RN, MSN, [facility] 04/15/14 [place], RN BSN CM

## 2014-04-15 NOTE — Progress Notes (Signed)
Inpatient Diabetes Program Recommendations  AACE/ADA: New Consensus Statement on Inpatient Glycemic Control (2013)  Target Ranges:  Prepandial:   less than 140 mg/dL      Peak postprandial:   less than 180 mg/dL (1-2 hours)      Critically ill patients:  140 - 180 mg/dL   Results for LAWSYN, HEILER (MRN [id number]) as of 04/15/2014 08:08  Ref. Range 04/15/2014 00:25 04/15/2014 04:04  Glucose Latest Range: 70-99 mg/dL 158 (H) 224 (H)   Diabetes history: DM2 Outpatient Diabetes medications: Diet controlled Current orders for Inpatient glycemic control: None  Inpatient Diabetes Program Recommendations Correction (SSI): Please consider ordering CBGs with Novolog correction scale ACHS. HgbA1C: Please order an A1C to evaluate glycemic control over the past 2-3 months.  Thanks, Barnie Alderman, RN, MSN, CCRN Diabetes Coordinator Inpatient Diabetes Program [phone] (Team Pager) [phone] (AP office) [phone] [facility] office)

## 2014-04-15 NOTE — Progress Notes (Signed)
PT is off BiPAP and doing extremely well. Will place back on machine if needed. She doesn't appear to have any acute problem  noted other than her wheezing.

## 2014-04-16 ENCOUNTER — Inpatient Hospital Stay (HOSPITAL_COMMUNITY): Payer: Medicare Other

## 2014-04-16 DIAGNOSIS — I10 Essential (primary) hypertension: Secondary | ICD-10-CM

## 2014-04-16 DIAGNOSIS — N184 Chronic kidney disease, stage 4 (severe): Secondary | ICD-10-CM | POA: Diagnosis present

## 2014-04-16 DIAGNOSIS — R1314 Dysphagia, pharyngoesophageal phase: Secondary | ICD-10-CM

## 2014-04-16 LAB — CBC WITH DIFFERENTIAL/PLATELET
BASOS ABS: 0 10*3/uL (ref 0.0–0.1)
Basophils Relative: 0 % (ref 0–1)
EOS PCT: 0 % (ref 0–5)
Eosinophils Absolute: 0 10*3/uL (ref 0.0–0.7)
HCT: 32.4 % — ABNORMAL LOW (ref 36.0–46.0)
Hemoglobin: 10 g/dL — ABNORMAL LOW (ref 12.0–15.0)
LYMPHS PCT: 7 % — AB (ref 12–46)
Lymphs Abs: 0.9 10*3/uL (ref 0.7–4.0)
MCH: 28.3 pg (ref 26.0–34.0)
MCHC: 30.9 g/dL (ref 30.0–36.0)
MCV: 91.8 fL (ref 78.0–100.0)
Monocytes Absolute: 0.3 10*3/uL (ref 0.1–1.0)
Monocytes Relative: 2 % — ABNORMAL LOW (ref 3–12)
NEUTROS ABS: 12 10*3/uL — AB (ref 1.7–7.7)
Neutrophils Relative %: 91 % — ABNORMAL HIGH (ref 43–77)
PLATELETS: 179 10*3/uL (ref 150–400)
RBC: 3.53 MIL/uL — ABNORMAL LOW (ref 3.87–5.11)
RDW: 15 % (ref 11.5–15.5)
WBC: 13.3 10*3/uL — AB (ref 4.0–10.5)

## 2014-04-16 LAB — GLUCOSE, CAPILLARY
GLUCOSE-CAPILLARY: 226 mg/dL — AB (ref 70–99)
Glucose-Capillary: 212 mg/dL — ABNORMAL HIGH (ref 70–99)
Glucose-Capillary: 234 mg/dL — ABNORMAL HIGH (ref 70–99)
Glucose-Capillary: 226 mg/dL — ABNORMAL HIGH (ref 70–99)

## 2014-04-16 LAB — COMPREHENSIVE METABOLIC PANEL
ALT: 10 U/L (ref 0–35)
ANION GAP: 15 (ref 5–15)
AST: 19 U/L (ref 0–37)
Albumin: 3.5 g/dL (ref 3.5–5.2)
Alkaline Phosphatase: 73 U/L (ref 39–117)
BILIRUBIN TOTAL: 0.3 mg/dL (ref 0.3–1.2)
BUN: 65 mg/dL — AB (ref 6–23)
CHLORIDE: 96 meq/L (ref 96–112)
CO2: 29 meq/L (ref 19–32)
Calcium: 9.2 mg/dL (ref 8.4–10.5)
Creatinine, Ser: 2.07 mg/dL — ABNORMAL HIGH (ref 0.50–1.10)
GFR calc Af Amer: 23 mL/min — ABNORMAL LOW (ref >90)
GFR, EST NON AFRICAN AMERICAN: 20 mL/min — AB (ref >90)
Glucose, Bld: 278 mg/dL — ABNORMAL HIGH (ref 70–99)
Potassium: 4.4 mEq/L (ref 3.7–5.3)
Sodium: 140 mEq/L (ref 137–147)
Total Protein: 7 g/dL (ref 6.0–8.3)

## 2014-04-16 MED ORDER — INSULIN ASPART 100 UNIT/ML ~~LOC~~ SOLN
0.0000 [IU] | Freq: Every day | SUBCUTANEOUS | Status: DC
  Administered 2014-04-16: 2 [IU] via SUBCUTANEOUS
  Administered 2014-04-17 – 2014-04-18 (×2): 3 [IU] via SUBCUTANEOUS

## 2014-04-16 MED ORDER — INSULIN ASPART 100 UNIT/ML ~~LOC~~ SOLN
0.0000 [IU] | Freq: Three times a day (TID) | SUBCUTANEOUS | Status: DC
  Administered 2014-04-17 (×2): 7 [IU] via SUBCUTANEOUS
  Administered 2014-04-17 – 2014-04-18 (×2): 11 [IU] via SUBCUTANEOUS
  Administered 2014-04-18: 3 [IU] via SUBCUTANEOUS
  Administered 2014-04-18: 20 [IU] via SUBCUTANEOUS
  Administered 2014-04-19 (×2): 15 [IU] via SUBCUTANEOUS
  Administered 2014-04-19: 7 [IU] via SUBCUTANEOUS
  Administered 2014-04-20: 3 [IU] via SUBCUTANEOUS
  Administered 2014-04-20: 4 [IU] via SUBCUTANEOUS

## 2014-04-16 NOTE — Progress Notes (Signed)
PT awakened appeared dyspneic ,wheezing audible increased. PT stated she thought she would feel better on mask(BiPAP). Placed PT back on BiPAP 10/5

## 2014-04-16 NOTE — Progress Notes (Signed)
[facility] PROGRESS NOTE  [name] ACZ:[id number] DOB: 1921-11-05 DOA: 04/14/2014 PCP: Cyndee Brightly, MD  Assessment/Plan: 1. Acute respiratory failure. Likely multifactorial related to COPD, CHF and recurrent aspiration. Patient is requiring intermittent BiPAP. We will continue current treatments and wean down oxygen as tolerated. 2. COPD exacerbation. She is currently on intravenous steroids, nebulizer treatments and antibiotics. Continue pulmonary toilet. 3. Acute on chronic diastolic congestive heart failure. Patient has evidence of mild interstitial edema on chest x-ray. She also has lower extremity edema. She is currently on intravenous Lasix with fair urine output. We'll continue current treatments. 4. Recurrent aspiration. Patient has known dysphagia and has had several swallowing evaluations in the past. She appears to be aspirating especially on thin liquids. We have requested a repeat speech evaluation 5. History of hypertension. Blood pressure is running on the lower side. We'll hold metoprolol for now until blood pressure stabilizes, especially in light of active wheezing related to #2. 6. Leukocytosis. Likely related to steroids. Continue to follow. She is afebrile 7. Chronic kidney disease stage IV. Creatinine appears to be near baseline. Follow closely especially with ongoing diuresis. 8. Diabetes. Blood sugars are elevated in setting of steroid use. She is on sliding scale insulin  Code Status: DO NOT RESUSCITATE Family Communication: No family present Disposition Plan: Discharge back to [facility] once improved   Consultants:  Speech therapy  Procedures:    Antibiotics:  Azithromycin 8/14  HPI/Subjective: Yesterday afternoon patient became increasingly short of breath and wheezing after drinking thin liquids. She was given nebulizer treatments and placed on BiPAP with improvement of her symptoms. She was eventually taken off of BiPAP and  given honey thick liquids which she appeared to be tolerating better. The patient fairly well overnight, but she did require BiPAP at night due to worsening dyspnea. She is now on nasal cannula. She feels that her breathing is doing better today. She does not have any expectorant with cough  Objective: Filed Vitals:   04/16/14 0800  BP: 88/69  Pulse: 78  Temp:   Resp: 19    Intake/Output Summary (Last 24 hours) at 04/16/14 0827 Last data filed at 04/16/14 0500  Gross per 24 hour  Intake      0 ml  Output   1470 ml  Net  -1470 ml   Filed Weights   04/14/14 2352 04/15/14 0458 04/16/14 0500  Weight: 99.791 kg (220 lb) 98.5 kg (217 lb 2.5 oz) 98.3 kg (216 lb 11.4 oz)    Exam:   General:  NAD  Cardiovascular: S1, S2 RRR  Respiratory: bilateral rhonchi and wheezing  Abdomen: soft, nt, nd, bs+  Musculoskeletal: 1+ edema b/l   Data Reviewed: Basic Metabolic Panel:  Recent Labs Lab 04/15/14 0025 04/15/14 0404 04/16/14 0440  NA 139 141 140  K 4.2 4.1 4.4  CL 97 97 96  CO2 28 25 29   GLUCOSE 158* 224* 278*  BUN 55* 57* 65*  CREATININE 2.05* 2.02* 2.07*  CALCIUM 9.2 9.3 9.2  MG  --  2.2  --    Liver Function Tests:  Recent Labs Lab 04/15/14 0404 04/16/14 0440  AST 28 19  ALT 11 10  ALKPHOS 82 73  BILITOT 0.5 0.3  PROT 7.5 7.0  ALBUMIN 3.7 3.5   No results found for this basename: LIPASE, AMYLASE,  in the last 168 hours No results found for this basename: AMMONIA,  in the last 168 hours CBC:  Recent Labs Lab 04/15/14 0025 04/15/14 0404 04/16/14  0440  WBC 12.2* 9.9 13.3*  NEUTROABS 8.2* 8.8* 12.0*  HGB 11.1* 10.9* 10.0*  HCT 34.8* 34.8* 32.4*  MCV 89.9 90.4 91.8  PLT 182 165 179   Cardiac Enzymes:  Recent Labs Lab 04/15/14 0025  TROPONINI <0.30   BNP (last 3 results)  Recent Labs  12/10/13 0928 04/15/14 0025  PROBNP 856.7* 2691.0*   CBG:  Recent Labs Lab 04/15/14 1120 04/15/14 1644 04/15/14 2117 04/16/14 0709  GLUCAP 265* 223*  256* 212*    Recent Results (from the past 240 hour(s))  MRSA PCR SCREENING     Status: None   Collection Time    04/15/14  3:35 AM      Result Value Ref Range Status   MRSA by PCR NEGATIVE  NEGATIVE Final   Comment:            The GeneXpert MRSA Assay (FDA     approved for NASAL specimens     only), is one component of a     comprehensive MRSA colonization     surveillance program. It is not     intended to diagnose MRSA     infection nor to guide or     monitor treatment for     MRSA infections.     Studies: Dg Chest Port 1 View  04/15/2014   CLINICAL DATA:  Shortness of breath.  History breast cancer.  EXAM: PORTABLE CHEST - 1 VIEW  COMPARISON:  Chest x-ray 04/13/2014.  FINDINGS: Mild cephalization of the pulmonary vasculature with indistinct interstitial markings. Chronic scarring in the left mid lung. Numerous calcified bilateral hilar and mediastinal lymph nodes. No consolidative airspace disease. No pleural effusions. Mild cardiomegaly. Upper mediastinal contours are within normal limits.  IMPRESSION: 1. Findings suggestive of mild congestive heart failure. 2. Extensive calcified mediastinal and hilar lymphadenopathy, similar to prior examinations.   Electronically Signed   By: Vinnie Langton M.D.   On: 04/15/2014 01:31    Scheduled Meds: . allopurinol  200 mg Oral Daily  . antiseptic oral rinse  7 mL Mouth Rinse q12n4p  . aspirin EC  81 mg Oral Daily  . azithromycin  500 mg Intravenous Q24H  . chlorhexidine  15 mL Mouth Rinse BID  . enoxaparin (LOVENOX) injection  30 mg Subcutaneous Q24H  . fenofibrate  54 mg Oral Daily  . furosemide  60 mg Intravenous BID  . guaiFENesin  1,200 mg Oral BID  . insulin aspart  0-15 Units Subcutaneous TID WC  . insulin aspart  0-5 Units Subcutaneous QHS  . ipratropium-albuterol  3 mL Nebulization Q6H  . methylPREDNISolone (SOLU-MEDROL) injection  60 mg Intravenous Q6H  . metoprolol  50 mg Oral BID  . pantoprazole  40 mg Oral Daily  .  PARoxetine  20 mg Oral BH-q7a  . polyvinyl alcohol  1 drop Both Eyes BID  . potassium chloride SA  20 mEq Oral BID  . simvastatin  20 mg Oral q1800   Continuous Infusions:   Active Problems:   Essential hypertension, benign   Type II or unspecified type diabetes mellitus without mention of complication, not stated as uncontrolled   COPD exacerbation   Acute on chronic diastolic CHF (congestive heart failure)   Acute renal insufficiency   Acute respiratory failure with hypoxia    Time spent: 47mins    Rajeev Escue  [facility] Pager [phone]. If 7PM-7AM, please contact night-coverage at [email or web address], password [facility] 04/16/2014, 8:27 AM  LOS: 2 days

## 2014-04-16 NOTE — Evaluation (Signed)
Clinical/Bedside Swallow Evaluation Patient Details  Name: [name] MRN: [id number] Date of Birth: 12/31/1921  Today's Date: 04/16/2014 Time: 0830-0925 SLP Time Calculation (min): 64 min  Past Medical History:  Past Medical History  Diagnosis Date  . Breast cancer   . Essential hypertension, benign   . Depression   . GERD (gastroesophageal reflux disease)   . Hypercholesteremia   . CKD (chronic kidney disease) stage 3, GFR 30-59 ml/min   . Pneumonia   . UTI (lower urinary tract infection)   . Arthritis   . Asthma   . Venous stasis   . Type 2 diabetes mellitus   . Chronic diastolic heart failure     LVEF 70%  . Mitral stenosis     Moderate  . Secondary pulmonary hypertension     PASP 64 mmHg   Past Surgical History:  Past Surgical History  Procedure Laterality Date  . Replacement total knee    . Mastectomy    . Total hip arthroplasty    . Carotid endarterectomy    . Cataracts     HPI:  [name] is an 80 yo female known to me from previous swallow studies and has been admitted with worsening shortness of breath, acute respiratory failure with hypoxia, likely multifactorial. She has been residing at [facility]. She briefly required BiPAP, but is currently on nasal cannula. ABG from this morning appears to be acceptable. She likely has a COPD exacerbation evident by significant wheezing and prolonged expiratory phase. Chest x-ray indicated mild acute on chronic diastolic congestive heart failure. Her BNP is elevated above baseline. She received 2 doses of intravenous Lasix thus far, we will continue her on intravenous Lasix for now. Monitor strict I.'s and O.'s. She is still significantly wheezing, so we will increase her Solu-Medrol to 60 mg every 6 hours. Continue her mucolytics and encourage flutter valve. She has been started on azithromycin for antibiotic coverage. Her last MBSS was completed in April 2015 with recommendation for D3/thin; pt did aspirate  nectar-thick liquids during the study, but safely tolerated small sips thin.    Assessment / Plan / Recommendation Clinical Impression  [name] is well known to SLP from previous MBSSs (February and April 2015). In February, her daughter, Fraser Din reported frequent choking/coughing episodes during meals and frequent upper respiratory infections. She reported that her mother always eats very quickly and has coughed for years during meals. Objective studies showed that as long as patient ate slowly and took single sips of liquid, she had no penetration/aspiration. When eating quickly and taking sequential swallows, pt did have penetration/aspiration at times. Being able to eat by mouth is very important to pt and she enjoys her meals.   Oral motor examination is unremarkable. Pt tolerated ice chips without incident. She needs mod/max cues to "go slowly" and avoid talking during meals. Pt was seen with breakfast meal (sausage, toast, grits, and pt requested cheerios). Pt with frequent, strong coughing episodes over the course of the meal- most significantly after trials thin water. Pt does not see any correlation with po intake and coughing episodes and says that she just has mucous she needs to get out (but unable). Pt frequently requested more food (biscuit, bananna, cereal) and thin liquids. Coughing during meals is not unusual for [name], however SLP feels pt would be safer with continuation of honey-thick liquids at this time and soft diet. She will need 100% supervision during meals with reminders to slow down, avoid talking during meals, and  to take small bites/sips. She is able to feed herself, but does have macular degeneration and has a hard time seeing items on her tray. Voicemail left for pt's daughter, Fraser Din. SLP will follow up on Monday with possible MBSS.    Aspiration Risk  Moderate    Diet Recommendation Dysphagia 3 (Mechanical Soft);Honey-thick liquid (ice chips PRN presented by RN  for comfort between meals)   Liquid Administration via: Cup Medication Administration: Whole meds with puree Supervision: Full supervision/cueing for compensatory strategies;Staff to assist with self feeding Compensations: Slow rate;Small sips/bites;Multiple dry swallows after each bite/sip Postural Changes and/or Swallow Maneuvers: Seated upright 90 degrees;Upright 30-60 min after meal    Other  Recommendations Recommended Consults: MBS (Possibly Monday) Oral Care Recommendations: Oral care Q4 per protocol Other Recommendations: Clarify dietary restrictions;Have oral suction available   Follow Up Recommendations  Skilled Nursing facility    Frequency and Duration min 2x/week  1 week   Pertinent Vitals/Pain VSS    SLP Swallow Goals   Pt will demonstrate safe and efficient consumption of least restrictive diet with use of strategies as needed.    Swallow Study Prior Functional Status   Resided at [facility].    General Date of Onset: 04/14/14 HPI: [name] is an 80 yo female known to me from previous swallow studies and has been admitted with worsening shortness of breath, acute respiratory failure with hypoxia, likely multifactorial. She has been residing at [facility]. She briefly required BiPAP, but is currently on nasal cannula. ABG from this morning appears to be acceptable. She likely has a COPD exacerbation evident by significant wheezing and prolonged expiratory phase. Chest x-ray indicated mild acute on chronic diastolic congestive heart failure. Her BNP is elevated above baseline. She received 2 doses of intravenous Lasix thus far, we will continue her on intravenous Lasix for now. Monitor strict I.'s and O.'s. She is still significantly wheezing, so we will increase her Solu-Medrol to 60 mg every 6 hours. Continue her mucolytics and encourage flutter valve. She has been started on azithromycin for antibiotic coverage. Her last MBSS was completed in April 2015 with recommendation for  D3/thin; pt did aspirate nectar-thick liquids during the study, but safely tolerated small sips thin.  Type of Study: Bedside swallow evaluation Previous Swallow Assessment: 12/14/2013: MBSS D3/thin; single sips only no straw Diet Prior to this Study: Dysphagia 3 (soft);Honey-thick liquids Temperature Spikes Noted: No Respiratory Status: Nasal cannula Behavior/Cognition: Alert;Cooperative;Pleasant mood Self-Feeding Abilities: Needs assist (macular degeneration) Patient Positioning: Upright in bed Baseline Vocal Quality: Clear (mildly harsh) Volitional Cough: Strong;Congested (unable to expectorate) Volitional Swallow: Able to elicit    Oral/Motor/Sensory Function Overall Oral Motor/Sensory Function: Appears within functional limits for tasks assessed   Ice Chips Ice chips: Within functional limits Presentation: Spoon   Thin Liquid Thin Liquid: Impaired Presentation: Self Fed;Spoon;Cup Pharyngeal  Phase Impairments: Suspected delayed Swallow;Cough - Delayed    Nectar Thick Nectar Thick Liquid: Not tested   Honey Thick Honey Thick Liquid: Within functional limits Presentation: Cup;Self fed   Puree Puree: Within functional limits Presentation: Spoon;Self Fed   Solid      Thank you,  Genene Churn, CCC-SLP [phone]  Solid: Impaired Presentation: Self Fed;Spoon Oral Phase Impairments:  (increased work of breathing with mastication) Pharyngeal Phase Impairments: Cough - Delayed       Max Romano 04/16/2014,9:45 AM

## 2014-04-16 NOTE — Plan of Care (Signed)
Problem: Phase I Progression Outcomes Goal: Dyspnea controlled at rest Outcome: Not Progressing Pt continues to have problems w/ choking when eating.her does not follow intructions to eat slowly, small bites and small single sips of po fluid. She also tries to talk while eating which causes her to choke.

## 2014-04-17 DIAGNOSIS — E119 Type 2 diabetes mellitus without complications: Secondary | ICD-10-CM

## 2014-04-17 LAB — BASIC METABOLIC PANEL
ANION GAP: 13 (ref 5–15)
BUN: 71 mg/dL — AB (ref 6–23)
CHLORIDE: 98 meq/L (ref 96–112)
CO2: 28 mEq/L (ref 19–32)
Calcium: 9.3 mg/dL (ref 8.4–10.5)
Creatinine, Ser: 2.11 mg/dL — ABNORMAL HIGH (ref 0.50–1.10)
GFR calc non Af Amer: 20 mL/min — ABNORMAL LOW (ref >90)
GFR, EST AFRICAN AMERICAN: 23 mL/min — AB (ref >90)
Glucose, Bld: 223 mg/dL — ABNORMAL HIGH (ref 70–99)
POTASSIUM: 4.5 meq/L (ref 3.7–5.3)
SODIUM: 139 meq/L (ref 137–147)

## 2014-04-17 LAB — GLUCOSE, CAPILLARY
GLUCOSE-CAPILLARY: 260 mg/dL — AB (ref 70–99)
GLUCOSE-CAPILLARY: 210 mg/dL — AB (ref 70–99)
Glucose-Capillary: 238 mg/dL — ABNORMAL HIGH (ref 70–99)
Glucose-Capillary: 291 mg/dL — ABNORMAL HIGH (ref 70–99)

## 2014-04-17 LAB — CBC WITH DIFFERENTIAL/PLATELET
Basophils Absolute: 0 10*3/uL (ref 0.0–0.1)
Basophils Relative: 0 % (ref 0–1)
Eosinophils Absolute: 0 10*3/uL (ref 0.0–0.7)
Eosinophils Relative: 0 % (ref 0–5)
HCT: 32.2 % — ABNORMAL LOW (ref 36.0–46.0)
HEMOGLOBIN: 10.1 g/dL — AB (ref 12.0–15.0)
LYMPHS ABS: 0.9 10*3/uL (ref 0.7–4.0)
LYMPHS PCT: 7 % — AB (ref 12–46)
MCH: 28.3 pg (ref 26.0–34.0)
MCHC: 31.4 g/dL (ref 30.0–36.0)
MCV: 90.2 fL (ref 78.0–100.0)
MONOS PCT: 3 % (ref 3–12)
Monocytes Absolute: 0.4 10*3/uL (ref 0.1–1.0)
NEUTROS ABS: 11.9 10*3/uL — AB (ref 1.7–7.7)
NEUTROS PCT: 90 % — AB (ref 43–77)
Platelets: 203 10*3/uL (ref 150–400)
RBC: 3.57 MIL/uL — ABNORMAL LOW (ref 3.87–5.11)
RDW: 15.5 % (ref 11.5–15.5)
WBC: 13.2 10*3/uL — AB (ref 4.0–10.5)

## 2014-04-17 LAB — URINE CULTURE
Colony Count: NO GROWTH
Culture: NO GROWTH

## 2014-04-17 LAB — TROPONIN I

## 2014-04-17 MED ORDER — PANTOPRAZOLE SODIUM 40 MG PO TBEC
40.0000 mg | DELAYED_RELEASE_TABLET | Freq: Two times a day (BID) | ORAL | Status: DC
Start: 2014-04-17 — End: 2014-04-20
  Administered 2014-04-17 – 2014-04-20 (×6): 40 mg via ORAL
  Filled 2014-04-17 (×6): qty 1

## 2014-04-17 MED ORDER — TORSEMIDE 20 MG PO TABS
80.0000 mg | ORAL_TABLET | Freq: Every day | ORAL | Status: DC
  Administered 2014-04-17 – 2014-04-20 (×4): 80 mg via ORAL
  Filled 2014-04-17 (×6): qty 4

## 2014-04-17 MED ORDER — METHYLPREDNISOLONE SODIUM SUCC 125 MG IJ SOLR
80.0000 mg | Freq: Four times a day (QID) | INTRAMUSCULAR | Status: DC
  Administered 2014-04-17 – 2014-04-19 (×8): 80 mg via INTRAVENOUS
  Filled 2014-04-17 (×8): qty 2

## 2014-04-17 MED ORDER — BENZONATATE 100 MG PO CAPS
100.0000 mg | ORAL_CAPSULE | Freq: Three times a day (TID) | ORAL | Status: DC | PRN
  Administered 2014-04-18 – 2014-04-20 (×3): 100 mg via ORAL
  Filled 2014-04-17 (×3): qty 1

## 2014-04-17 MED ORDER — AMLODIPINE BESYLATE 5 MG PO TABS
5.0000 mg | ORAL_TABLET | Freq: Every day | ORAL | Status: DC
  Administered 2014-04-17 – 2014-04-20 (×4): 5 mg via ORAL
  Filled 2014-04-17 (×4): qty 1

## 2014-04-17 NOTE — Progress Notes (Signed)
PT resting without BiPAP.

## 2014-04-17 NOTE — Progress Notes (Signed)
At 1943, patient complained of indigestion, mylanta 30cc po given, no aspiration occurred.  Patient had some relief.  Again at this time patient complaints of mid chest pressure, notified MD, EKG attained and troponin ordered.  Monitoring closely, vitals stable for this patient

## 2014-04-17 NOTE — Progress Notes (Signed)
[facility] PROGRESS NOTE  [name] VXY:[id number] DOB: May 27, 1922 DOA: 04/14/2014 PCP: Cyndee Brightly, MD  Assessment/Plan: 1. Acute respiratory failure. Likely multifactorial related to COPD, CHF and recurrent aspiration. Patient is requiring intermittent BiPAP. We will continue current treatments and wean down oxygen as tolerated. 2. COPD exacerbation. She is currently on intravenous steroids, nebulizer treatments and antibiotics. Continue pulmonary toilet. She continues to wheeze, so we will increase steroid dosing. 3. Acute on chronic diastolic congestive heart failure. Patient has evidence of mild interstitial edema on chest x-ray. She also has lower extremity edema. She is currently on intravenous Lasix with fair urine output. She will be transitioned to oral demadex. 4. Recurrent aspiration. Patient has known dysphagia and has had several swallowing evaluations in the past. She appears to be aspirating especially on thin liquids. We have requested a repeat speech evaluation 5. History of hypertension. Blood pressure trending up.  Metoprolol held due to ongoing wheezing.  Will start on norvasc. 6. Leukocytosis. Likely related to steroids. Continue to follow. She is afebrile 7. Chronic kidney disease stage IV. Creatinine appears to be near baseline. Follow closely especially with ongoing diuresis. 8. Diabetes. Blood sugars are elevated in setting of steroid use. She is on sliding scale insulin  Code Status: DO NOT RESUSCITATE Family Communication: discussed with daughter at the bedside Disposition Plan: Discharge back to [facility] once improved   Consultants:  Speech therapy  Procedures:    Antibiotics:  Azithromycin 8/14  HPI/Subjective: Briefly required bipap overnight. Feels that breathing is better. Still wheezing and coughing  Objective: Filed Vitals:   04/17/14 1000  BP: 161/58  Pulse: 79  Temp:   Resp: 18    Intake/Output Summary (Last  24 hours) at 04/17/14 1056 Last data filed at 04/17/14 1000  Gross per 24 hour  Intake    980 ml  Output   1650 ml  Net   -670 ml   Filed Weights   04/15/14 0458 04/16/14 0500 04/17/14 0423  Weight: 98.5 kg (217 lb 2.5 oz) 98.3 kg (216 lb 11.4 oz) 98.2 kg (216 lb 7.9 oz)    Exam:   General:  NAD  Cardiovascular: S1, S2 RRR  Respiratory: bilateral rhonchi and wheezing  Abdomen: soft, nt, nd, bs+  Musculoskeletal: 1+ edema b/l   Data Reviewed: Basic Metabolic Panel:  Recent Labs Lab 04/15/14 0025 04/15/14 0404 04/16/14 0440 04/17/14 0139  NA 139 141 140 139  K 4.2 4.1 4.4 4.5  CL 97 97 96 98  CO2 28 25 29 28   GLUCOSE 158* 224* 278* 223*  BUN 55* 57* 65* 71*  CREATININE 2.05* 2.02* 2.07* 2.11*  CALCIUM 9.2 9.3 9.2 9.3  MG  --  2.2  --   --    Liver Function Tests:  Recent Labs Lab 04/15/14 0404 04/16/14 0440  AST 28 19  ALT 11 10  ALKPHOS 82 73  BILITOT 0.5 0.3  PROT 7.5 7.0  ALBUMIN 3.7 3.5   No results found for this basename: LIPASE, AMYLASE,  in the last 168 hours No results found for this basename: AMMONIA,  in the last 168 hours CBC:  Recent Labs Lab 04/15/14 0025 04/15/14 0404 04/16/14 0440 04/17/14 0139  WBC 12.2* 9.9 13.3* 13.2*  NEUTROABS 8.2* 8.8* 12.0* 11.9*  HGB 11.1* 10.9* 10.0* 10.1*  HCT 34.8* 34.8* 32.4* 32.2*  MCV 89.9 90.4 91.8 90.2  PLT 182 165 179 203   Cardiac Enzymes:  Recent Labs Lab 04/15/14 0025 04/17/14 0139  TROPONINI <  0.30 <0.30   BNP (last 3 results)  Recent Labs  12/10/13 0928 04/15/14 0025  PROBNP 856.7* 2691.0*   CBG:  Recent Labs Lab 04/16/14 0709 04/16/14 1121 04/16/14 1629 04/16/14 2240 04/17/14 0728  GLUCAP 212* 234* 226* 226* 260*    Recent Results (from the past 240 hour(s))  URINE CULTURE     Status: None   Collection Time    04/15/14  1:24 AM      Result Value Ref Range Status   Specimen Description URINE, CATHETERIZED   Final   Special Requests NONE   Final   Culture   Setup Time     Final   Value: 04/15/2014 13:57     Performed at [facility]     Final   Value: NO GROWTH     Performed at [facility]   Culture     Final   Value: NO GROWTH     Performed at [facility]   Report Status 04/17/2014 FINAL   Final  MRSA PCR SCREENING     Status: None   Collection Time    04/15/14  3:35 AM      Result Value Ref Range Status   MRSA by PCR NEGATIVE  NEGATIVE Final   Comment:            The GeneXpert MRSA Assay (FDA     approved for NASAL specimens     only), is one component of a     comprehensive MRSA colonization     surveillance program. It is not     intended to diagnose MRSA     infection nor to guide or     monitor treatment for     MRSA infections.     Studies: Dg Chest Port 1 View  04/16/2014   CLINICAL DATA:  Pneumonia.  EXAM: PORTABLE CHEST - 1 VIEW  COMPARISON:  04/15/2014.  FINDINGS: Low lung volumes with mild vascular congestion. Platelike scarring or atelectasis LEFT midlung zone. Unchanged calcified hilar and mediastinal and lymph nodes. Increased cardiomediastinal silhouette.  IMPRESSION: Little change from prior radiograph. No focal areas of consolidation to suggest pneumonia. Mild vascular congestion.   Electronically Signed   By: Rolla Flatten M.D.   On: 04/16/2014 08:51    Scheduled Meds: . allopurinol  200 mg Oral Daily  . antiseptic oral rinse  7 mL Mouth Rinse q12n4p  . aspirin EC  81 mg Oral Daily  . azithromycin  500 mg Intravenous Q24H  . chlorhexidine  15 mL Mouth Rinse BID  . enoxaparin (LOVENOX) injection  30 mg Subcutaneous Q24H  . fenofibrate  54 mg Oral Daily  . furosemide  60 mg Intravenous BID  . guaiFENesin  1,200 mg Oral BID  . insulin aspart  0-20 Units Subcutaneous TID WC  . insulin aspart  0-5 Units Subcutaneous QHS  . ipratropium-albuterol  3 mL Nebulization Q6H  . methylPREDNISolone (SOLU-MEDROL) injection  80 mg Intravenous Q6H  . pantoprazole  40 mg Oral BID  .  PARoxetine  20 mg Oral BH-q7a  . polyvinyl alcohol  1 drop Both Eyes BID  . simvastatin  20 mg Oral q1800  . torsemide  80 mg Oral Daily   Continuous Infusions:   Principal Problem:   Acute respiratory failure with hypoxia Active Problems:   Essential hypertension, benign   Type II or unspecified type diabetes mellitus without mention of complication, not stated as uncontrolled   COPD exacerbation  Acute on chronic diastolic CHF (congestive heart failure)   Acute renal insufficiency   Chronic kidney disease (CKD), stage IV (severe)    Time spent: 46mins    Rasheida Broden  [facility] Pager [phone]. If 7PM-7AM, please contact night-coverage at [email or web address], password [facility] 04/17/2014, 10:56 AM  LOS: 3 days

## 2014-04-18 ENCOUNTER — Inpatient Hospital Stay (HOSPITAL_COMMUNITY): Payer: Medicare Other

## 2014-04-18 LAB — GLUCOSE, CAPILLARY
Glucose-Capillary: 274 mg/dL — ABNORMAL HIGH (ref 70–99)
Glucose-Capillary: 357 mg/dL — ABNORMAL HIGH (ref 70–99)
Glucose-Capillary: 139 mg/dL — ABNORMAL HIGH (ref 70–99)
Glucose-Capillary: 260 mg/dL — ABNORMAL HIGH (ref 70–99)

## 2014-04-18 LAB — CBC WITH DIFFERENTIAL/PLATELET
BASOS PCT: 0 % (ref 0–1)
Basophils Absolute: 0 10*3/uL (ref 0.0–0.1)
EOS ABS: 0 10*3/uL (ref 0.0–0.7)
Eosinophils Relative: 0 % (ref 0–5)
HEMATOCRIT: 32.4 % — AB (ref 36.0–46.0)
Hemoglobin: 10.1 g/dL — ABNORMAL LOW (ref 12.0–15.0)
LYMPHS ABS: 0.7 10*3/uL (ref 0.7–4.0)
Lymphocytes Relative: 9 % — ABNORMAL LOW (ref 12–46)
MCH: 28.4 pg (ref 26.0–34.0)
MCHC: 31.2 g/dL (ref 30.0–36.0)
MCV: 91 fL (ref 78.0–100.0)
MONOS PCT: 4 % (ref 3–12)
Monocytes Absolute: 0.3 10*3/uL (ref 0.1–1.0)
NEUTROS ABS: 7.4 10*3/uL (ref 1.7–7.7)
NEUTROS PCT: 87 % — AB (ref 43–77)
Platelets: 204 10*3/uL (ref 150–400)
RBC: 3.56 MIL/uL — AB (ref 3.87–5.11)
RDW: 15.6 % — ABNORMAL HIGH (ref 11.5–15.5)
WBC: 8.4 10*3/uL (ref 4.0–10.5)

## 2014-04-18 LAB — BASIC METABOLIC PANEL WITH GFR
Anion gap: 14 (ref 5–15)
BUN: 73 mg/dL — ABNORMAL HIGH (ref 6–23)
CO2: 33 meq/L — ABNORMAL HIGH (ref 19–32)
Calcium: 9.3 mg/dL (ref 8.4–10.5)
Chloride: 98 meq/L (ref 96–112)
Creatinine, Ser: 1.96 mg/dL — ABNORMAL HIGH (ref 0.50–1.10)
GFR calc Af Amer: 25 mL/min — ABNORMAL LOW
GFR calc non Af Amer: 21 mL/min — ABNORMAL LOW
Glucose, Bld: 285 mg/dL — ABNORMAL HIGH (ref 70–99)
Potassium: 4.3 meq/L (ref 3.7–5.3)
Sodium: 145 meq/L (ref 137–147)

## 2014-04-18 MED ORDER — INSULIN ASPART 100 UNIT/ML ~~LOC~~ SOLN
4.0000 [IU] | Freq: Three times a day (TID) | SUBCUTANEOUS | Status: DC
  Administered 2014-04-18 – 2014-04-20 (×6): 4 [IU] via SUBCUTANEOUS

## 2014-04-18 MED ORDER — TORSEMIDE 20 MG PO TABS: 80.0000 mg | ORAL_TABLET | Freq: Every day | ORAL | Status: DC

## 2014-04-18 MED ORDER — AZITHROMYCIN 250 MG PO TABS
500.0000 mg | ORAL_TABLET | Freq: Every day | ORAL | Status: DC
  Administered 2014-04-18 – 2014-04-19 (×2): 500 mg via ORAL
  Filled 2014-04-18 (×2): qty 2

## 2014-04-18 NOTE — Progress Notes (Signed)
Inpatient Diabetes Program Recommendations  AACE/ADA: New Consensus Statement on Inpatient Glycemic Control (2013)  Target Ranges:  Prepandial:   less than 140 mg/dL      Peak postprandial:   less than 180 mg/dL (1-2 hours)      Critically ill patients:  140 - 180 mg/dL   Results for [name], [name] (MRN [id number]) as of 04/18/2014 09:07  Ref. Range 04/17/2014 07:28 04/17/2014 11:33 04/17/2014 16:27 04/17/2014 20:57  Glucose-Capillary Latest Range: 70-99 mg/dL 260 (H) 210 (H) 238 (H) 291 (H)   Diabetes history: DM2 Outpatient Diabetes medications: diet controlled Current orders for Inpatient glycemic control: Novolog 0-20 units AC, Novolog 0-5 units HS  Inpatient Diabetes Program Recommendations Insulin - Basal: Please consider ordering low dose basal insulin; recommend starting with Levemir 10 units daily. Insulin - Meal Coverage: Please consider ordering Novolog 4 units TID wtih meals for meal coverage. HgbA1C: Please order an A1C to evaluate glycemic control over the past 2-3 months.  Thanks, Barnie Alderman, RN, MSN, CCRN Diabetes Coordinator Inpatient Diabetes Program [phone] (Team Pager) [phone] ([facility] office) [phone] [facility] office)

## 2014-04-18 NOTE — Progress Notes (Signed)
SLP Note  Patient Details Name: [name] MRN: [id number] DOB: Nov 05, 1921           MBSS scheduled for today at 12:00 PM. Please hold her lunch tray in room to eat after the study.  Thank you,  Genene Churn, [place]    [place] 04/18/2014, 9:49 AM

## 2014-04-18 NOTE — Progress Notes (Signed)
PHARMACIST - PHYSICIAN COMMUNICATION DR:   [place] CONCERNING: Antibiotic IV to Oral Route Change Policy  RECOMMENDATION: This patient is receiving Zithromax by the intravenous route.  Based on criteria approved by the Pharmacy and Therapeutics Committee, the antibiotic(s) is/are being converted to the equivalent oral dose form(s).  DESCRIPTION: These criteria include:  Patient being treated for a respiratory tract infection, urinary tract infection, cellulitis or clostridium difficile associated diarrhea if on metronidazole  The patient is not neutropenic and does not exhibit a GI malabsorption state  The patient is eating (either orally or via tube) and/or has been taking other orally administered medications for a least 24 hours  The patient is improving clinically and has a Tmax < 100.5  If you have questions about this conversion, please contact the Pharmacy Department  [x]  ( [phone] )  [facility] []  ( [phone]  [facility]  []  ( [phone] )  [facility] []  ( [phone] )  [facility]   S. Joahan Swatzell, PharmD  

## 2014-04-18 NOTE — Progress Notes (Signed)
[facility] PROGRESS NOTE  [name] EZM:[id number] DOB: 06/20/1922 DOA: 04/14/2014 PCP: Cyndee Brightly, MD  Assessment/Plan: 1. Acute respiratory failure. Likely multifactorial related to COPD, CHF and recurrent aspiration. Patient was requiring intermittent BiPAP, but appears to be improving. We will continue current treatments and wean down oxygen as tolerated. 2. COPD exacerbation. She is currently on intravenous steroids, nebulizer treatments and antibiotics. Continue pulmonary toilet. Appears to be slowly improving.  Continue current treatments. 3. Acute on chronic diastolic congestive heart failure. Patient had evidence of mild interstitial edema on chest x-ray. She also has lower extremity edema, which likely has a chronic component. Will transition back to outpatient demadex dosing. 4. Recurrent aspiration. Patient has known dysphagia and has had several swallowing evaluations in the past. She appeared to be aspirating especially on thin liquids. Speech therapy is seeing the patient and she is now on honey thick liquids.  Plans are for MBSS today. 5. History of hypertension. Blood pressure trending up.  Metoprolol held due to ongoing wheezing. Started on norvasc.  Will add hydralazine. 6. Leukocytosis. Likely related to steroids. Continue to follow. She is afebrile 7. Chronic kidney disease stage IV. Creatinine appears to be near baseline. Continue to follow. 8. Diabetes. Blood sugars are elevated in setting of steroid use. She is on sliding scale insulin. Will add novolog meal coverage  Code Status: DO NOT RESUSCITATE Family Communication: no family present Disposition Plan: Discharge back to [facility] once improved   Consultants:  Speech therapy  Procedures:    Antibiotics:  Azithromycin 8/14  HPI/Subjective: Continues to cough.  Overall feeling mildly improved. No chest pain.  Objective: Filed Vitals:   04/18/14 0856  BP: 180/59  Pulse:    Temp:   Resp:     Intake/Output Summary (Last 24 hours) at 04/18/14 0939 Last data filed at 04/18/14 0500  Gross per 24 hour  Intake    930 ml  Output   1000 ml  Net    -70 ml   Filed Weights   04/16/14 0500 04/17/14 0423 04/18/14 0430  Weight: 98.3 kg (216 lb 11.4 oz) 98.2 kg (216 lb 7.9 oz) 97.5 kg (214 lb 15.2 oz)    Exam:   General:  NAD  Cardiovascular: S1, S2 RRR  Respiratory: occasional rhonchi, but otherwise clear, wheezing improved  Abdomen: soft, nt, nd, bs+  Musculoskeletal: 1+ edema b/l   Data Reviewed: Basic Metabolic Panel:  Recent Labs Lab 04/15/14 0025 04/15/14 0404 04/16/14 0440 04/17/14 0139 04/18/14 0438  NA 139 141 140 139 145  K 4.2 4.1 4.4 4.5 4.3  CL 97 97 96 98 98  CO2 28 25 29 28  33*  GLUCOSE 158* 224* 278* 223* 285*  BUN 55* 57* 65* 71* 73*  CREATININE 2.05* 2.02* 2.07* 2.11* 1.96*  CALCIUM 9.2 9.3 9.2 9.3 9.3  MG  --  2.2  --   --   --    Liver Function Tests:  Recent Labs Lab 04/15/14 0404 04/16/14 0440  AST 28 19  ALT 11 10  ALKPHOS 82 73  BILITOT 0.5 0.3  PROT 7.5 7.0  ALBUMIN 3.7 3.5   No results found for this basename: LIPASE, AMYLASE,  in the last 168 hours No results found for this basename: AMMONIA,  in the last 168 hours CBC:  Recent Labs Lab 04/15/14 0025 04/15/14 0404 04/16/14 0440 04/17/14 0139 04/18/14 0438  WBC 12.2* 9.9 13.3* 13.2* 8.4  NEUTROABS 8.2* 8.8* 12.0* 11.9* 7.4  HGB 11.1* 10.9* 10.0*  10.1* 10.1*  HCT 34.8* 34.8* 32.4* 32.2* 32.4*  MCV 89.9 90.4 91.8 90.2 91.0  PLT 182 165 179 203 204   Cardiac Enzymes:  Recent Labs Lab 04/15/14 0025 04/17/14 0139  TROPONINI <0.30 <0.30   BNP (last 3 results)  Recent Labs  12/10/13 0928 04/15/14 0025  PROBNP 856.7* 2691.0*   CBG:  Recent Labs Lab 04/16/14 2240 04/17/14 0728 04/17/14 1133 04/17/14 1627 04/17/14 2057  GLUCAP 226* 260* 210* 238* 291*    Recent Results (from the past 240 hour(s))  URINE CULTURE     Status:  None   Collection Time    04/15/14  1:24 AM      Result Value Ref Range Status   Specimen Description URINE, CATHETERIZED   Final   Special Requests NONE   Final   Culture  Setup Time     Final   Value: 04/15/2014 13:57     Performed at [facility]     Final   Value: NO GROWTH     Performed at [facility]   Culture     Final   Value: NO GROWTH     Performed at [facility]   Report Status 04/17/2014 FINAL   Final  MRSA PCR SCREENING     Status: None   Collection Time    04/15/14  3:35 AM      Result Value Ref Range Status   MRSA by PCR NEGATIVE  NEGATIVE Final   Comment:            The GeneXpert MRSA Assay (FDA     approved for NASAL specimens     only), is one component of a     comprehensive MRSA colonization     surveillance program. It is not     intended to diagnose MRSA     infection nor to guide or     monitor treatment for     MRSA infections.     Studies: No results found.  Scheduled Meds: . allopurinol  200 mg Oral Daily  . amLODipine  5 mg Oral Daily  . antiseptic oral rinse  7 mL Mouth Rinse q12n4p  . aspirin EC  81 mg Oral Daily  . azithromycin  500 mg Intravenous Q24H  . chlorhexidine  15 mL Mouth Rinse BID  . enoxaparin (LOVENOX) injection  30 mg Subcutaneous Q24H  . fenofibrate  54 mg Oral Daily  . furosemide  60 mg Intravenous BID  . guaiFENesin  1,200 mg Oral BID  . insulin aspart  0-20 Units Subcutaneous TID WC  . insulin aspart  0-5 Units Subcutaneous QHS  . ipratropium-albuterol  3 mL Nebulization Q6H  . methylPREDNISolone (SOLU-MEDROL) injection  80 mg Intravenous Q6H  . pantoprazole  40 mg Oral BID  . PARoxetine  20 mg Oral BH-q7a  . polyvinyl alcohol  1 drop Both Eyes BID  . simvastatin  20 mg Oral q1800  . torsemide  80 mg Oral Daily   Continuous Infusions:   Principal Problem:   Acute respiratory failure with hypoxia Active Problems:   Essential hypertension, benign   Type II or  unspecified type diabetes mellitus without mention of complication, not stated as uncontrolled   COPD exacerbation   Acute on chronic diastolic CHF (congestive heart failure)   Acute renal insufficiency   Chronic kidney disease (CKD), stage IV (severe)    Time spent: 5mins    [name]  [facility] Pager [phone].  If 7PM-7AM, please contact night-coverage at [email or web address], password [facility] 04/18/2014, 9:39 AM  LOS: 4 days

## 2014-04-18 NOTE — Clinical Social Work Note (Signed)
CSW updated Kerri at [facility] on pt. Facility continues to be willing to accept pt at d/c. CSW will follow.  Benay Pike, [place]

## 2014-04-18 NOTE — Procedures (Signed)
Objective Swallowing Evaluation: Modified Barium Swallowing Study   Patient Details  Name: [name] MRN: [id number] Date of Birth: 03-Mar-1922  Today's Date: 04/18/2014 Time: 1200-1230 SLP Time Calculation (min): 30 min  Past Medical History:  Past Medical History  Diagnosis Date  . Breast cancer   . Essential hypertension, benign   . Depression   . GERD (gastroesophageal reflux disease)   . Hypercholesteremia   . CKD (chronic kidney disease) stage 3, GFR 30-59 ml/min   . Pneumonia   . UTI (lower urinary tract infection)   . Arthritis   . Asthma   . Venous stasis   . Type 2 diabetes mellitus   . Chronic diastolic heart failure     LVEF 70%  . Mitral stenosis     Moderate  . Secondary pulmonary hypertension     PASP 64 mmHg   Past Surgical History:  Past Surgical History  Procedure Laterality Date  . Replacement total knee    . Mastectomy    . Total hip arthroplasty    . Carotid endarterectomy    . Cataracts     HPI:  Ms. [name] is an 80 yo female known to me from previous swallow studies and has been admitted with worsening shortness of breath, acute respiratory failure with hypoxia, likely multifactorial. She has been residing at [facility]. She briefly required BiPAP, but is currently on nasal cannula. ABG from this morning appears to be acceptable. She likely has a COPD exacerbation evident by significant wheezing and prolonged expiratory phase. Chest x-ray indicated mild acute on chronic diastolic congestive heart failure. Her BNP is elevated above baseline. She received 2 doses of intravenous Lasix thus far, we will continue her on intravenous Lasix for now. Monitor strict I.'s and O.'s. She is still significantly wheezing, so we will increase her Solu-Medrol to 60 mg every 6 hours. Continue her mucolytics and encourage flutter valve. She has been started on azithromycin for antibiotic coverage. Her last MBSS was completed in April 2015 with recommendation for  D3/thin; pt did aspirate nectar-thick liquids during the study, but safely tolerated small sips thin.      Assessment / Plan / Recommendation Clinical Impression  Dysphagia Diagnosis: Mild pharyngeal phase dysphagia Clinical impression: [name] presented with flat affect today, very different from when seen on Saturday AM. Her daughter, [name], accompanied her to the MBSS and states that she thinks her mom is pretty depressed about being in the hospital and drinking thick liquids. Pt given a small ice chip to moisten oral cavity prior to MBSS which elicited strong coughing. Today's study reveals overall mild pharyngeal phase dysphagia characterized by premature spillage of liquids to the valleculae, but timely swallow initiation (normal for age). Pt was challenged with thin liquids throughout the study and pt only had one episode of flash penetration to the cords which was immediately removed with spontaneous cough. She had occasional underepiglottic coating during the swallow with thin. Pt with mild lingual residue after initial bolus which pools in valleculae, but is spontaneously cleared with repeat swallow. Pt did not exhibit any of the coughing she did excessively over the weekend, however her breathing is much improved, she was seated upright at 90 degrees for test, and she was not talking during intake. Recommend D3/mech soft with thin liquids with pt sitting upright (in chair if possible) for all eating/drinking and pt to refrain from talking until meal is finished. Continue small bites, go slowly. 100% supervision for strategies. If pt is having  significant COPD exacerbation or shortness of breath, would recommend giving honey-thickened liquids until back to baseline. This is a longstanding issue with pt. Recommend SLP f/u for diet tolerance and education of strategies in SNF. SLP will follow while in acute setting.     Treatment Recommendation  Therapy as outlined in treatment plan below     Diet Recommendation Dysphagia 3 (Mechanical Soft);Thin liquid   Liquid Administration via: Cup;Straw (small sips) Medication Administration: Whole meds with puree Supervision: Full supervision/cueing for compensatory strategies;Staff to assist with self feeding Compensations: Slow rate;Small sips/bites;Multiple dry swallows after each bite/sip Postural Changes and/or Swallow Maneuvers: Out of bed for meals;Seated upright 90 degrees;Upright 30-60 min after meal    Other  Recommendations Oral Care Recommendations: Oral care BID Other Recommendations: Clarify dietary restrictions   Follow Up Recommendations  Skilled Nursing facility    Frequency and Duration min 2x/week  1 week   Pertinent Vitals/Pain No pain, VSS    SLP Swallow Goals   Pt will demonstrate safe and efficient consumption of least restrictive diet with use of strategies as needed.    General Date of Onset: 04/14/14 HPI: Ms. [name] is an 80 yo female known to me from previous swallow studies and has been admitted with worsening shortness of breath, acute respiratory failure with hypoxia, likely multifactorial. She has been residing at [facility]. She briefly required BiPAP, but is currently on nasal cannula. ABG from this morning appears to be acceptable. She likely has a COPD exacerbation evident by significant wheezing and prolonged expiratory phase. Chest x-ray indicated mild acute on chronic diastolic congestive heart failure. Her BNP is elevated above baseline. She received 2 doses of intravenous Lasix thus far, we will continue her on intravenous Lasix for now. Monitor strict I.'s and O.'s. She is still significantly wheezing, so we will increase her Solu-Medrol to 60 mg every 6 hours. Continue her mucolytics and encourage flutter valve. She has been started on azithromycin for antibiotic coverage. Her last MBSS was completed in April 2015 with recommendation for D3/thin; pt did aspirate nectar-thick liquids during the study,  but safely tolerated small sips thin.  Type of Study: Modified Barium Swallowing Study Reason for Referral: Objectively evaluate swallowing function Previous Swallow Assessment: 12/14/2013: MBSS D3/thin; single sips only no straw Diet Prior to this Study: Dysphagia 3 (soft);Honey-thick liquids Temperature Spikes Noted: No Respiratory Status: Nasal cannula History of Recent Intubation: No Behavior/Cognition: Alert;Cooperative;Pleasant mood Oral Cavity - Dentition: Adequate natural dentition Oral Motor / Sensory Function: Within functional limits Self-Feeding Abilities: Needs assist (vision impairment) Patient Positioning: Upright in chair Baseline Vocal Quality: Clear Volitional Cough: Strong;Congested Volitional Swallow: Able to elicit Anatomy: Within functional limits Pharyngeal Secretions: Not observed secondary MBS    Reason for Referral Objectively evaluate swallowing function   Oral Phase Oral Preparation/Oral Phase Oral Phase: WFL   Pharyngeal Phase Pharyngeal Phase Pharyngeal Phase: Impaired Pharyngeal - Nectar Pharyngeal - Nectar Cup: Premature spillage to valleculae;Pharyngeal residue - valleculae (mild lingual residue post swallow pools in valleculae but cl) Pharyngeal - Thin Pharyngeal - Thin Teaspoon: Within functional limits Pharyngeal - Thin Cup: Premature spillage to valleculae Pharyngeal - Thin Straw: Premature spillage to valleculae;Premature spillage to pyriform sinuses;Penetration/Aspiration during swallow;Pharyngeal residue - valleculae Penetration/Aspiration details (thin straw): Material does not enter airway;Material enters airway, remains ABOVE vocal cords then ejected out;Material enters airway, CONTACTS cords then ejected out Pharyngeal - Solids Pharyngeal - Puree: Within functional limits Pharyngeal - Mechanical Soft: Within functional limits Pharyngeal - Pill: Within functional limits  Cervical  Esophageal Phase    Thank you,  Genene Churn,  CCC-SLP [phone]     Cervical Esophageal Phase Cervical Esophageal Phase: [facility]         Kyce Ging 04/18/2014, 2:20 PM

## 2014-04-19 LAB — BASIC METABOLIC PANEL
Anion gap: 14 (ref 5–15)
BUN: 69 mg/dL — ABNORMAL HIGH (ref 6–23)
CHLORIDE: 96 meq/L (ref 96–112)
CO2: 32 meq/L (ref 19–32)
CREATININE: 1.98 mg/dL — AB (ref 0.50–1.10)
Calcium: 8.9 mg/dL (ref 8.4–10.5)
GFR calc Af Amer: 25 mL/min — ABNORMAL LOW (ref >90)
GFR calc non Af Amer: 21 mL/min — ABNORMAL LOW (ref >90)
Glucose, Bld: 296 mg/dL — ABNORMAL HIGH (ref 70–99)
Potassium: 3.6 mEq/L — ABNORMAL LOW (ref 3.7–5.3)
Sodium: 142 mEq/L (ref 137–147)

## 2014-04-19 LAB — CBC WITH DIFFERENTIAL/PLATELET
BASOS PCT: 0 % (ref 0–1)
Basophils Absolute: 0 10*3/uL (ref 0.0–0.1)
EOS PCT: 0 % (ref 0–5)
Eosinophils Absolute: 0 10*3/uL (ref 0.0–0.7)
HEMATOCRIT: 33.2 % — AB (ref 36.0–46.0)
HEMOGLOBIN: 10.5 g/dL — AB (ref 12.0–15.0)
Lymphocytes Relative: 8 % — ABNORMAL LOW (ref 12–46)
Lymphs Abs: 0.7 10*3/uL (ref 0.7–4.0)
MCH: 28.5 pg (ref 26.0–34.0)
MCHC: 31.6 g/dL (ref 30.0–36.0)
MCV: 90 fL (ref 78.0–100.0)
MONO ABS: 0.3 10*3/uL (ref 0.1–1.0)
MONOS PCT: 3 % (ref 3–12)
NEUTROS ABS: 7.6 10*3/uL (ref 1.7–7.7)
Neutrophils Relative %: 89 % — ABNORMAL HIGH (ref 43–77)
Platelets: 206 10*3/uL (ref 150–400)
RBC: 3.69 MIL/uL — ABNORMAL LOW (ref 3.87–5.11)
RDW: 15.4 % (ref 11.5–15.5)
WBC: 8.6 10*3/uL (ref 4.0–10.5)

## 2014-04-19 LAB — GLUCOSE, CAPILLARY
GLUCOSE-CAPILLARY: 312 mg/dL — AB (ref 70–99)
GLUCOSE-CAPILLARY: 208 mg/dL — AB (ref 70–99)
GLUCOSE-CAPILLARY: 208 mg/dL — AB (ref 70–99)
Glucose-Capillary: 301 mg/dL — ABNORMAL HIGH (ref 70–99)

## 2014-04-19 MED ORDER — IPRATROPIUM-ALBUTEROL 0.5-2.5 (3) MG/3ML IN SOLN
3.0000 mL | Freq: Three times a day (TID) | RESPIRATORY_TRACT | Status: DC
  Administered 2014-04-20 (×2): 3 mL via RESPIRATORY_TRACT
  Filled 2014-04-19 (×2): qty 3

## 2014-04-19 MED ORDER — GUAIFENESIN ER 600 MG PO TB12: 600.0000 mg | ORAL_TABLET | Freq: Two times a day (BID) | ORAL | Status: DC

## 2014-04-19 MED ORDER — PREDNISONE 20 MG PO TABS: ORAL_TABLET | ORAL | Status: DC

## 2014-04-19 MED ORDER — PREDNISONE 20 MG PO TABS
60.0000 mg | ORAL_TABLET | Freq: Every day | ORAL | Status: DC
  Administered 2014-04-20: 60 mg via ORAL
  Filled 2014-04-19: qty 3

## 2014-04-19 MED ORDER — BENZONATATE 100 MG PO CAPS: 100.0000 mg | ORAL_CAPSULE | Freq: Three times a day (TID) | ORAL | Status: DC | PRN

## 2014-04-19 MED ORDER — AMLODIPINE BESYLATE 5 MG PO TABS: 5.0000 mg | ORAL_TABLET | Freq: Every day | ORAL | Status: DC

## 2014-04-19 NOTE — Progress Notes (Signed)
Inpatient Diabetes Program Recommendations  AACE/ADA: New Consensus Statement on Inpatient Glycemic Control (2013)  Target Ranges:  Prepandial:   less than 140 mg/dL      Peak postprandial:   less than 180 mg/dL (1-2 hours)      Critically ill patients:  140 - 180 mg/dL    Results for [name], [name] (MRN [id number]) as of 04/19/2014 08:31  Ref. Range 04/18/2014 08:03 04/18/2014 11:45 04/18/2014 16:21 04/18/2014 21:31 04/19/2014 07:42  Glucose-Capillary Latest Range: 70-99 mg/dL 274 (H) 357 (H) 139 (H) 260 (H) 312 (H)    Diabetes history: DM2  Outpatient Diabetes medications: diet controlled  Current orders for Inpatient glycemic control: Novolog 0-20 units AC, Novolog 0-5 units HS, Novolog 4 units TID with meals  Inpatient Diabetes Program Recommendations Insulin - Basal: Please consider ordering low dose basal insulin; recommend starting with Levemir 10 units daily. HgbA1C: Please order an A1C to evaluate glycemic control over the past 2-3 months.  Note: CBGs ranged from 139-357 mg/dl on 8/17 and patient received a total of Novolog 37 units on 04/18/14 for glucose correction. Patient continues on steroids which are noted cause of hyperglycemia. If steroids will be continued, please consider ordering low dose basal insulin to improve inpatient glycemic control.  Thanks, Barnie Alderman, RN, MSN, CCRN Diabetes Coordinator Inpatient Diabetes Program [phone] (Team Pager) [phone] ([facility] office) [phone] [facility] office)

## 2014-04-19 NOTE — Discharge Summary (Signed)
Physician Discharge Summary  [name] AST:[id number] DOB: 11/24/1921 DOA: 04/14/2014  PCP: Cyndee Brightly, MD  Admit date: 04/14/2014 Discharge date: 04/19/2014  Time spent: 40 minutes  Recommendations for Outpatient Follow-up:  1. Discharge back to [facility] for continued care  Discharge Diagnoses:  Principal Problem:   Acute respiratory failure with hypoxia Active Problems:   Essential hypertension, benign   Type II or unspecified type diabetes mellitus without mention of complication, not stated as uncontrolled   COPD exacerbation   Acute on chronic diastolic CHF (congestive heart failure)   Acute renal insufficiency   Chronic kidney disease (CKD), stage IV (severe)   Discharge Condition: improving  Diet recommendation: low salt, low carb, dysphagia 3, thin liquids  Filed Weights   04/17/14 0423 04/18/14 0430 04/19/14 0437  Weight: 98.2 kg (216 lb 7.9 oz) 97.5 kg (214 lb 15.2 oz) 97 kg (213 lb 13.5 oz)    History of present illness and hospital course:  This patient was admitted to the hospital with progressive shortness of breath. She was found to have acute respiratory failure which is likely related to possible COPD exacerbation, aspiration, and some element of congestive heart failure. She is currently on 2-3 L of oxygen and may need to be discharged on the same. Clinically her respiratory status appears to be improving and she is breathing comfortably. Her oxygen can slowly be weaned off as an outpatient.  Regarding COPD exacerbation, she was treated with intravenous steroids, antibiotics and dilators. Her steroids have been changed to prednisone taper. She is encouraged to use her flutter valve.  2 is also noted to have some mild volume overload. It was felt that she may have some acute on chronic diastolic congestive heart failure. She was diuresed with intravenous Lasix with good response. She does not appear to have any further pulmonary edema, and  lower extremity edema appears to have improved significantly. Will transition back to her outpatient diuretic regimen.  Regarding her dysphasia. She was initially felt to be aspirating on thin liquids. She was seen by speech therapy who performed a modified barium swallow. Recommendations after the study for the patient may be continued on dysphagia 3 diet with thin liquids. She surprisingly did not have any significant aspiration with thin liquids. She was advised to eat/drink slowly, not to talk during meals and take 2 dry sips after every bite/sip. She should be sitting upright 90 during meals if possible, as well as 30-60 minutes after a meal. If the patient develops respiratory issues such as COPD exacerbation, she should be transitioned to honey thick liquids until her respiratory status returns to baseline.   Procedures:  MBSS  Consultations:  Speech therapy  Discharge Exam: Filed Vitals:   04/19/14 2127  BP: 173/59  Pulse: 93  Temp: 98.2 F (36.8 C)  Resp: 17    General: NAD Cardiovascular: s1, s2, rrr Respiratory: mild expiratory wheezes  Discharge Instructions You were cared for by a hospitalist during your hospital stay. If you have any questions about your discharge medications or the care you received while you were in the hospital after you are discharged, you can call the unit and asked to speak with the hospitalist on call if the hospitalist that took care of you is not available. Once you are discharged, your primary care physician will handle any further medical issues. Please note that NO REFILLS for any discharge medications will be authorized once you are discharged, as it is imperative that you return to your  primary care physician (or establish a relationship with a primary care physician if you do not have one) for your aftercare needs so that they can reassess your need for medications and monitor your lab values.     Medication List         acetaminophen  325 MG tablet  Commonly known as:  TYLENOL  Take 650 mg by mouth every 6 (six) hours as needed for mild pain.     AEROCHAMBER MV inhaler  Use as instructed. 2 puffs BID     albuterol 108 (90 BASE) MCG/ACT inhaler  Commonly known as:  PROVENTIL HFA;VENTOLIN HFA  Inhale 2 puffs into the lungs every 6 (six) hours as needed for wheezing or shortness of breath.     allopurinol 100 MG tablet  Commonly known as:  ZYLOPRIM  TAKE (2) TABLETS BY MOUTH ONCE DAILY.     amLODipine 5 MG tablet  Commonly known as:  NORVASC  Take 1 tablet (5 mg total) by mouth daily.     ASPIRIN LOW DOSE 81 MG EC tablet  Generic drug:  aspirin  TAKE 1 TABLET BY MOUTH ONCE DAILY.     benzonatate 100 MG capsule  Commonly known as:  TESSALON  Take 1 capsule (100 mg total) by mouth 3 (three) times daily as needed for cough.     Cranberry 475 MG Caps  Take 1 capsule by mouth every 12 (twelve) hours.     fenofibrate 54 MG tablet  Take 1 tablet (54 mg total) by mouth daily.     hydrOXYzine 25 MG tablet  Commonly known as:  ATARAX/VISTARIL  Take 12.5 mg by mouth every 6 (six) hours as needed for itching.     ipratropium-albuterol 0.5-2.5 (3) MG/3ML Soln  Commonly known as:  DUONEB  Take 3 mLs by nebulization every 4 (four) hours as needed (shortness of breath).     magnesium hydroxide 400 MG/5ML suspension  Commonly known as:  MILK OF MAGNESIA  Take 30 mLs by mouth daily as needed for mild constipation.     metoprolol 50 MG tablet  Commonly known as:  LOPRESSOR  TAKE 1 TABLET BY MOUTH TWICE DAILY.     MOISTURIZING CREAM EX  Apply 1 application topically 2 (two) times daily.     MYLANTA 200-200-20 MG/5ML suspension  Generic drug:  alum & mag hydroxide-simeth  Take by mouth every 4 (four) hours as needed for indigestion or heartburn.     omeprazole 20 MG capsule  Commonly known as:  PRILOSEC  Take 20 mg by mouth daily.     oxybutynin 5 MG 24 hr tablet  Commonly known as:  DITROPAN-XL  Take 1  tablet (5 mg total) by mouth at bedtime.     PARoxetine 20 MG tablet  Commonly known as:  PAXIL  Take 20 mg by mouth every morning.     predniSONE 20 MG tablet  Commonly known as:  DELTASONE  Take 60mg  for 2 days then 50mg  for 2 days then 40mg  for 2 days then 30mg  for 2 days then 20mg  for 2 days then 10mg  for 2 days then stop     simvastatin 20 MG tablet  Commonly known as:  ZOCOR  TAKE 1 TABLET BY MOUTH AT BEDTIME.     SYSTANE OP  Place 1 drop into both eyes 2 (two) times daily.     torsemide 20 MG tablet  Commonly known as:  DEMADEX  Take 80 mg by mouth daily. Take 4 tablets  to equal 80 mg total daily     TUSSIN 100 MG/5ML syrup  Generic drug:  guaifenesin  Take 200 mg by mouth every 6 (six) hours as needed for cough.     guaiFENesin 600 MG 12 hr tablet  Commonly known as:  MUCINEX  Take 1 tablet (600 mg total) by mouth 2 (two) times daily.     UTI-STAT PO  Take 30 mLs by mouth daily.       Allergies  Allergen Reactions  . Carbapenems Other (See Comments)    unknown  . Cephalosporins Other (See Comments)    unknown  . Amoxicillin Rash  . Penicillins Rash      The results of significant diagnostics from this hospitalization (including imaging, microbiology, ancillary and laboratory) are listed below for reference.    Significant Diagnostic Studies: Dg Chest 1 View  04/13/2014   CLINICAL DATA:  Wheezing  EXAM: CHEST - 1 VIEW  COMPARISON:  February 02, 2014  FINDINGS: There is cardiomegaly with pulmonary venous hypertension. No edema or consolidation. There is mild bilateral lower lobe atelectatic change. There are scattered small calcified granulomas as well as hilar and azygos region calcified lymph nodes. No frank adenopathy.  IMPRESSION: Evidence of volume overload with cardiomegaly and pulmonary venous hypertension. No frank edema or consolidation. Evidence of prior granulomatous disease. Mild bibasilar atelectatic change.   Electronically Signed   By: Lowella Grip M.D.   On: 04/13/2014 15:10   Dg Chest Port 1 View  04/16/2014   CLINICAL DATA:  Pneumonia.  EXAM: PORTABLE CHEST - 1 VIEW  COMPARISON:  04/15/2014.  FINDINGS: Low lung volumes with mild vascular congestion. Platelike scarring or atelectasis LEFT midlung zone. Unchanged calcified hilar and mediastinal and lymph nodes. Increased cardiomediastinal silhouette.  IMPRESSION: Little change from prior radiograph. No focal areas of consolidation to suggest pneumonia. Mild vascular congestion.   Electronically Signed   By: Rolla Flatten M.D.   On: 04/16/2014 08:51   Dg Chest Port 1 View  04/15/2014   CLINICAL DATA:  Shortness of breath.  History breast cancer.  EXAM: PORTABLE CHEST - 1 VIEW  COMPARISON:  Chest x-ray 04/13/2014.  FINDINGS: Mild cephalization of the pulmonary vasculature with indistinct interstitial markings. Chronic scarring in the left mid lung. Numerous calcified bilateral hilar and mediastinal lymph nodes. No consolidative airspace disease. No pleural effusions. Mild cardiomegaly. Upper mediastinal contours are within normal limits.  IMPRESSION: 1. Findings suggestive of mild congestive heart failure. 2. Extensive calcified mediastinal and hilar lymphadenopathy, similar to prior examinations.   Electronically Signed   By: Vinnie Langton M.D.   On: 04/15/2014 01:31   Dg Swallowing Func-speech Pathology  04/18/2014   Ephraim Hamburger, CCC-SLP     04/18/2014  2:21 PM Objective Swallowing Evaluation: Modified Barium Swallowing Study    Patient Details  Name: AERYN MEDICI MRN: [id number] Date of Birth: [birth year]  Today's Date: 04/18/2014 Time: 1200-1230 SLP Time Calculation (min): 30 min  Past Medical History:  Past Medical History  Diagnosis Date  . Breast cancer   . Essential hypertension, benign   . Depression   . GERD (gastroesophageal reflux disease)   . Hypercholesteremia   . CKD (chronic kidney disease) stage 3, GFR 30-59 ml/min   . Pneumonia   . UTI (lower urinary tract infection)    . Arthritis   . Asthma   . Venous stasis   . Type 2 diabetes mellitus   . Chronic diastolic heart failure  LVEF 70%  . Mitral stenosis     Moderate  . Secondary pulmonary hypertension     PASP 64 mmHg   Past Surgical History:  Past Surgical History  Procedure Laterality Date  . Replacement total knee    . Mastectomy    . Total hip arthroplasty    . Carotid endarterectomy    . Cataracts     HPI:  [name] is an 80 yo female known to me from previous  swallow studies and has been admitted with worsening shortness of  breath, acute respiratory failure with hypoxia, likely  multifactorial. She has been residing at [facility]. She briefly  required BiPAP, but is currently on nasal cannula. ABG from this  morning appears to be acceptable. She likely has a COPD  exacerbation evident by significant wheezing and prolonged  expiratory phase. Chest x-ray indicated mild acute on chronic  diastolic congestive heart failure. Her BNP is elevated above  baseline. She received 2 doses of intravenous Lasix thus far, we  will continue her on intravenous Lasix for now. Monitor strict  I.'s and O.'s. She is still significantly wheezing, so we will  increase her Solu-Medrol to 60 mg every 6 hours. Continue her  mucolytics and encourage flutter valve. She has been started on  azithromycin for antibiotic coverage. Her last MBSS was completed  in April 2015 with recommendation for D3/thin; pt did aspirate  nectar-thick liquids during the study, but safely tolerated small  sips thin.      Assessment / Plan / Recommendation Clinical Impression  Dysphagia Diagnosis: Mild pharyngeal phase dysphagia Clinical impression: [name] presented with flat affect  today, very different from when seen on Saturday AM. Her  daughter, Fraser Din, accompanied her to the MBSS and states that she  thinks her mom is pretty depressed about being in the hospital  and drinking thick liquids. Pt given a small ice chip to moisten  oral cavity prior to MBSS which  elicited strong coughing. Today's  study reveals overall mild pharyngeal phase dysphagia  characterized by premature spillage of liquids to the valleculae,  but timely swallow initiation (normal for age). Pt was challenged  with thin liquids throughout the study and pt only had one  episode of flash penetration to the cords which was immediately  removed with spontaneous cough. She had occasional  underepiglottic coating during the swallow with thin. Pt with  mild lingual residue after initial bolus which pools in  valleculae, but is spontaneously cleared with repeat swallow. Pt  did not exhibit any of the coughing she did excessively over the  weekend, however her breathing is much improved, she was seated  upright at 90 degrees for test, and she was not talking during  intake. Recommend D3/mech soft with thin liquids with pt sitting  upright (in chair if possible) for all eating/drinking and pt to  refrain from talking until meal is finished. Continue small  bites, go slowly. 100% supervision for strategies. If pt is  having significant COPD exacerbation or shortness of breath,  would recommend giving honey-thickened liquids until back to  baseline. This is a longstanding issue with pt. Recommend SLP f/u  for diet tolerance and education of strategies in SNF. SLP will  follow while in acute setting.     Treatment Recommendation  Therapy as outlined in treatment plan below    Diet Recommendation Dysphagia 3 (Mechanical Soft);Thin liquid   Liquid Administration via: Cup;Straw (small sips) Medication Administration: Whole meds with puree Supervision: Full  supervision/cueing for compensatory  strategies;Staff to assist with self feeding Compensations: Slow rate;Small sips/bites;Multiple dry swallows  after each bite/sip Postural Changes and/or Swallow Maneuvers: Out of bed for  meals;Seated upright 90 degrees;Upright 30-60 min after meal    Other  Recommendations Oral Care Recommendations: Oral care BID Other  Recommendations: Clarify dietary restrictions   Follow Up Recommendations  Skilled Nursing facility    Frequency and Duration min 2x/week  1 week   Pertinent Vitals/Pain No pain, VSS    SLP Swallow Goals   Pt will demonstrate safe and efficient consumption of least  restrictive diet with use of strategies as needed.    General Date of Onset: 04/14/14 HPI: [name] is an 80 yo female known to me from previous  swallow studies and has been admitted with worsening shortness of  breath, acute respiratory failure with hypoxia, likely  multifactorial. She has been residing at [facility]. She briefly  required BiPAP, but is currently on nasal cannula. ABG from this  morning appears to be acceptable. She likely has a COPD  exacerbation evident by significant wheezing and prolonged  expiratory phase. Chest x-ray indicated mild acute on chronic  diastolic congestive heart failure. Her BNP is elevated above  baseline. She received 2 doses of intravenous Lasix thus far, we  will continue her on intravenous Lasix for now. Monitor strict  I.'s and O.'s. She is still significantly wheezing, so we will  increase her Solu-Medrol to 60 mg every 6 hours. Continue her  mucolytics and encourage flutter valve. She has been started on  azithromycin for antibiotic coverage. Her last MBSS was completed  in April 2015 with recommendation for D3/thin; pt did aspirate  nectar-thick liquids during the study, but safely tolerated small  sips thin.  Type of Study: Modified Barium Swallowing Study Reason for Referral: Objectively evaluate swallowing function Previous Swallow Assessment: 12/14/2013: MBSS D3/thin; single sips  only no straw Diet Prior to this Study: Dysphagia 3 (soft);Honey-thick liquids Temperature Spikes Noted: No Respiratory Status: Nasal cannula History of Recent Intubation: No Behavior/Cognition: Alert;Cooperative;Pleasant mood Oral Cavity - Dentition: Adequate natural dentition Oral Motor / Sensory Function: Within functional  limits Self-Feeding Abilities: Needs assist (vision impairment) Patient Positioning: Upright in chair Baseline Vocal Quality: Clear Volitional Cough: Strong;Congested Volitional Swallow: Able to elicit Anatomy: Within functional limits Pharyngeal Secretions: Not observed secondary MBS    Reason for Referral Objectively evaluate swallowing function   Oral Phase Oral Preparation/Oral Phase Oral Phase: WFL   Pharyngeal Phase Pharyngeal Phase Pharyngeal Phase: Impaired Pharyngeal - Nectar Pharyngeal - Nectar Cup: Premature spillage to  valleculae;Pharyngeal residue - valleculae (mild lingual residue  post swallow pools in valleculae but cl) Pharyngeal - Thin Pharyngeal - Thin Teaspoon: Within functional limits Pharyngeal - Thin Cup: Premature spillage to valleculae Pharyngeal - Thin Straw: Premature spillage to  valleculae;Premature spillage to pyriform  sinuses;Penetration/Aspiration during swallow;Pharyngeal residue  - valleculae Penetration/Aspiration details (thin straw): Material does not  enter airway;Material enters airway, remains ABOVE vocal cords  then ejected out;Material enters airway, CONTACTS cords then  ejected out Pharyngeal - Solids Pharyngeal - Puree: Within functional limits Pharyngeal - Mechanical Soft: Within functional limits Pharyngeal - Pill: Within functional limits  Cervical Esophageal Phase    Thank you,  Genene Churn, CCC-SLP [phone]     Cervical Esophageal Phase Cervical Esophageal Phase: [facility]         Hamilton,DABNEY 04/18/2014, 2:20 PM     Microbiology: Recent Results (from the past 240 hour(s))  URINE CULTURE  Status: None   Collection Time    04/15/14  1:24 AM      Result Value Ref Range Status   Specimen Description URINE, CATHETERIZED   Final   Special Requests NONE   Final   Culture  Setup Time     Final   Value: 04/15/2014 13:57     Performed at [place]     Final   Value: NO GROWTH     Performed at [facility]   Culture      Final   Value: NO GROWTH     Performed at [facility]   Report Status 04/17/2014 FINAL   Final  MRSA PCR SCREENING     Status: None   Collection Time    04/15/14  3:35 AM      Result Value Ref Range Status   MRSA by PCR NEGATIVE  NEGATIVE Final   Comment:            The GeneXpert MRSA Assay (FDA     approved for NASAL specimens     only), is one component of a     comprehensive MRSA colonization     surveillance program. It is not     intended to diagnose MRSA     infection nor to guide or     monitor treatment for     MRSA infections.     Labs: Basic Metabolic Panel:  Recent Labs Lab 04/15/14 0025 04/15/14 0404 04/16/14 0440 04/17/14 0139 04/18/14 0438 04/19/14 0559  NA 139 141 140 139 145 142  K 4.2 4.1 4.4 4.5 4.3 3.6*  CL 97 97 96 98 98 96  CO2 28 25 29 28  33* 32  GLUCOSE 158* 224* 278* 223* 285* 296*  BUN 55* 57* 65* 71* 73* 69*  CREATININE 2.05* 2.02* 2.07* 2.11* 1.96* 1.98*  CALCIUM 9.2 9.3 9.2 9.3 9.3 8.9  MG  --  2.2  --   --   --   --    Liver Function Tests:  Recent Labs Lab 04/15/14 0404 04/16/14 0440  AST 28 19  ALT 11 10  ALKPHOS 82 73  BILITOT 0.5 0.3  PROT 7.5 7.0  ALBUMIN 3.7 3.5   No results found for this basename: LIPASE, AMYLASE,  in the last 168 hours No results found for this basename: AMMONIA,  in the last 168 hours CBC:  Recent Labs Lab 04/15/14 0404 04/16/14 0440 04/17/14 0139 04/18/14 0438 04/19/14 0559  WBC 9.9 13.3* 13.2* 8.4 8.6  NEUTROABS 8.8* 12.0* 11.9* 7.4 7.6  HGB 10.9* 10.0* 10.1* 10.1* 10.5*  HCT 34.8* 32.4* 32.2* 32.4* 33.2*  MCV 90.4 91.8 90.2 91.0 90.0  PLT 165 179 203 204 206   Cardiac Enzymes:  Recent Labs Lab 04/15/14 0025 04/17/14 0139  TROPONINI <0.30 <0.30   BNP: BNP (last 3 results)  Recent Labs  12/10/13 0928 04/15/14 0025  PROBNP 856.7* 2691.0*   CBG:  Recent Labs Lab 04/18/14 1621 04/18/14 2131 04/19/14 0742 04/19/14 1212 04/19/14 1647  GLUCAP 139* 260* 312*  301* 208*       Signed:  MEMON,JEHANZEB  [facility] 04/19/2014, 9:37 PM

## 2014-04-19 NOTE — Progress Notes (Signed)
[facility] PROGRESS NOTE  SHIR BERGMAN HBZ:[id number] DOB: Nov 19, 1921 DOA: 04/14/2014 PCP: Cyndee Brightly, MD  Assessment/Plan: 1. Acute respiratory failure. Likely multifactorial related to COPD, CHF and recurrent aspiration. Patient was requiring intermittent BiPAP, but appears to be improving. We will continue current treatments and wean down oxygen as tolerated. 2. COPD exacerbation. She is currently on intravenous steroids, nebulizer treatments and antibiotics. Continue pulmonary toilet. Appears to be slowly improving.  Transition steroids to prednisone 3. Acute on chronic diastolic congestive heart failure. Patient had evidence of mild interstitial edema on chest x-ray. She also had lower extremity edema, which likely has a chronic component. She was diuresed with intravenous Lasix and now has been changed back to outpatient Demadex dosing. 4. Recurrent aspiration. Patient has known dysphagia and has had several swallowing evaluations in the past. She appeared to be aspirating currently her hospital course, especially on thin liquids. Speech therapy was consulted.  Modified barium swallow study indicated that patient could tolerate thin liquids without frank aspiration. It was recommended that patient continue on dysphagia 3 diet with thin liquids. Patient should be advised not to talk while eating and to eat/drink slowly. She was advised to take 2 dry swallows after every bite/sip. She was advised to switch to honey thick liquids if any respiratory issues related to COPD until back to baseline 5. History of hypertension. stable.  Metoprolol held due to ongoing wheezing. Started on norvasc.  Added hydralazine. 6. Leukocytosis. Likely related to steroids. Resolved. 7. Chronic kidney disease stage IV. Creatinine appears to be near baseline. Continue to follow. 8. Diabetes. Blood sugars are elevated in setting of steroid use. She is on sliding scale insulin and meal coverage.  Hopefully will start to improve since steroids are being de escalated  Code Status: DO NOT RESUSCITATE Family Communication: discussed with daughter at the bedside Disposition Plan: Discharge back to [facility] once improved, likely in am   Consultants:  Speech therapy  Procedures:    Antibiotics:  Azithromycin 8/14  HPI/Subjective: Feeling better. Still wheezing and coughing. MBSS yesterday indicated that patient could tolerate thin liquids.  Objective: Filed Vitals:   04/19/14 1551  BP: 136/56  Pulse: 94  Temp: 98.3 F (36.8 C)  Resp: 20    Intake/Output Summary (Last 24 hours) at 04/19/14 2000 Last data filed at 04/19/14 1859  Gross per 24 hour  Intake   1320 ml  Output    250 ml  Net   1070 ml   Filed Weights   04/17/14 0423 04/18/14 0430 04/19/14 0437  Weight: 98.2 kg (216 lb 7.9 oz) 97.5 kg (214 lb 15.2 oz) 97 kg (213 lb 13.5 oz)    Exam:   General:  NAD  Cardiovascular: S1, S2 RRR  Respiratory: mild bilateral exp wheezing  Abdomen: soft, nt, nd, bs+  Musculoskeletal: trace edema b/l   Data Reviewed: Basic Metabolic Panel:  Recent Labs Lab 04/15/14 0025 04/15/14 0404 04/16/14 0440 04/17/14 0139 04/18/14 0438 04/19/14 0559  NA 139 141 140 139 145 142  K 4.2 4.1 4.4 4.5 4.3 3.6*  CL 97 97 96 98 98 96  CO2 28 25 29 28  33* 32  GLUCOSE 158* 224* 278* 223* 285* 296*  BUN 55* 57* 65* 71* 73* 69*  CREATININE 2.05* 2.02* 2.07* 2.11* 1.96* 1.98*  CALCIUM 9.2 9.3 9.2 9.3 9.3 8.9  MG  --  2.2  --   --   --   --    Liver Function Tests:  Recent Labs  Lab 04/15/14 0404 04/16/14 0440  AST 28 19  ALT 11 10  ALKPHOS 82 73  BILITOT 0.5 0.3  PROT 7.5 7.0  ALBUMIN 3.7 3.5   No results found for this basename: LIPASE, AMYLASE,  in the last 168 hours No results found for this basename: AMMONIA,  in the last 168 hours CBC:  Recent Labs Lab 04/15/14 0404 04/16/14 0440 04/17/14 0139 04/18/14 0438 04/19/14 0559  WBC 9.9 13.3* 13.2* 8.4  8.6  NEUTROABS 8.8* 12.0* 11.9* 7.4 7.6  HGB 10.9* 10.0* 10.1* 10.1* 10.5*  HCT 34.8* 32.4* 32.2* 32.4* 33.2*  MCV 90.4 91.8 90.2 91.0 90.0  PLT 165 179 203 204 206   Cardiac Enzymes:  Recent Labs Lab 04/15/14 0025 04/17/14 0139  TROPONINI <0.30 <0.30   BNP (last 3 results)  Recent Labs  12/10/13 0928 04/15/14 0025  PROBNP 856.7* 2691.0*   CBG:  Recent Labs Lab 04/18/14 1621 04/18/14 2131 04/19/14 0742 04/19/14 1212 04/19/14 1647  GLUCAP 139* 260* 312* 301* 208*    Recent Results (from the past 240 hour(s))  URINE CULTURE     Status: None   Collection Time    04/15/14  1:24 AM      Result Value Ref Range Status   Specimen Description URINE, CATHETERIZED   Final   Special Requests NONE   Final   Culture  Setup Time     Final   Value: 04/15/2014 13:57     Performed at [place]     Final   Value: NO GROWTH     Performed at [facility]   Culture     Final   Value: NO GROWTH     Performed at [facility]   Report Status 04/17/2014 FINAL   Final  MRSA PCR SCREENING     Status: None   Collection Time    04/15/14  3:35 AM      Result Value Ref Range Status   MRSA by PCR NEGATIVE  NEGATIVE Final   Comment:            The GeneXpert MRSA Assay (FDA     approved for NASAL specimens     only), is one component of a     comprehensive MRSA colonization     surveillance program. It is not     intended to diagnose MRSA     infection nor to guide or     monitor treatment for     MRSA infections.     Studies: Dg Swallowing Func-speech Pathology  04/18/2014   Ephraim Hamburger, CCC-SLP     04/18/2014  2:21 PM Objective Swallowing Evaluation: Modified Barium Swallowing Study    Patient Details  Name: AMANI MARSEILLE MRN: [id number] Date of Birth: [birth year]  Today's Date: 04/18/2014 Time: 1200-1230 SLP Time Calculation (min): 30 min  Past Medical History:  Past Medical History  Diagnosis Date  . Breast cancer   . Essential  hypertension, benign   . Depression   . GERD (gastroesophageal reflux disease)   . Hypercholesteremia   . CKD (chronic kidney disease) stage 3, GFR 30-59 ml/min   . Pneumonia   . UTI (lower urinary tract infection)   . Arthritis   . Asthma   . Venous stasis   . Type 2 diabetes mellitus   . Chronic diastolic heart failure     LVEF 70%  . Mitral stenosis     Moderate  . Secondary pulmonary  hypertension     PASP 64 mmHg   Past Surgical History:  Past Surgical History  Procedure Laterality Date  . Replacement total knee    . Mastectomy    . Total hip arthroplasty    . Carotid endarterectomy    . Cataracts     HPI:  Ms. Scipio is an 80 yo female known to me from previous  swallow studies and has been admitted with worsening shortness of  breath, acute respiratory failure with hypoxia, likely  multifactorial. She has been residing at [facility]. She briefly  required BiPAP, but is currently on nasal cannula. ABG from this  morning appears to be acceptable. She likely has a COPD  exacerbation evident by significant wheezing and prolonged  expiratory phase. Chest x-ray indicated mild acute on chronic  diastolic congestive heart failure. Her BNP is elevated above  baseline. She received 2 doses of intravenous Lasix thus far, we  will continue her on intravenous Lasix for now. Monitor strict  I.'s and O.'s. She is still significantly wheezing, so we will  increase her Solu-Medrol to 60 mg every 6 hours. Continue her  mucolytics and encourage flutter valve. She has been started on  azithromycin for antibiotic coverage. Her last MBSS was completed  in April 2015 with recommendation for D3/thin; pt did aspirate  nectar-thick liquids during the study, but safely tolerated small  sips thin.      Assessment / Plan / Recommendation Clinical Impression  Dysphagia Diagnosis: Mild pharyngeal phase dysphagia Clinical impression: Mrs. Swim presented with flat affect  today, very different from when seen on Saturday AM. Her  daughter,  Fraser Din, accompanied her to the MBSS and states that she  thinks her mom is pretty depressed about being in the hospital  and drinking thick liquids. Pt given a small ice chip to moisten  oral cavity prior to MBSS which elicited strong coughing. Today's  study reveals overall mild pharyngeal phase dysphagia  characterized by premature spillage of liquids to the valleculae,  but timely swallow initiation (normal for age). Pt was challenged  with thin liquids throughout the study and pt only had one  episode of flash penetration to the cords which was immediately  removed with spontaneous cough. She had occasional  underepiglottic coating during the swallow with thin. Pt with  mild lingual residue after initial bolus which pools in  valleculae, but is spontaneously cleared with repeat swallow. Pt  did not exhibit any of the coughing she did excessively over the  weekend, however her breathing is much improved, she was seated  upright at 90 degrees for test, and she was not talking during  intake. Recommend D3/mech soft with thin liquids with pt sitting  upright (in chair if possible) for all eating/drinking and pt to  refrain from talking until meal is finished. Continue small  bites, go slowly. 100% supervision for strategies. If pt is  having significant COPD exacerbation or shortness of breath,  would recommend giving honey-thickened liquids until back to  baseline. This is a longstanding issue with pt. Recommend SLP f/u  for diet tolerance and education of strategies in SNF. SLP will  follow while in acute setting.     Treatment Recommendation  Therapy as outlined in treatment plan below    Diet Recommendation Dysphagia 3 (Mechanical Soft);Thin liquid   Liquid Administration via: Cup;Straw (small sips) Medication Administration: Whole meds with puree Supervision: Full supervision/cueing for compensatory  strategies;Staff to assist with self feeding Compensations: Slow rate;Small sips/bites;Multiple dry  swallows  after  each bite/sip Postural Changes and/or Swallow Maneuvers: Out of bed for  meals;Seated upright 90 degrees;Upright 30-60 min after meal    Other  Recommendations Oral Care Recommendations: Oral care BID Other Recommendations: Clarify dietary restrictions   Follow Up Recommendations  Skilled Nursing facility    Frequency and Duration min 2x/week  1 week   Pertinent Vitals/Pain No pain, VSS    SLP Swallow Goals   Pt will demonstrate safe and efficient consumption of least  restrictive diet with use of strategies as needed.    General Date of Onset: 04/14/14 HPI: Ms. Goda is an 80 yo female known to me from previous  swallow studies and has been admitted with worsening shortness of  breath, acute respiratory failure with hypoxia, likely  multifactorial. She has been residing at [facility]. She briefly  required BiPAP, but is currently on nasal cannula. ABG from this  morning appears to be acceptable. She likely has a COPD  exacerbation evident by significant wheezing and prolonged  expiratory phase. Chest x-ray indicated mild acute on chronic  diastolic congestive heart failure. Her BNP is elevated above  baseline. She received 2 doses of intravenous Lasix thus far, we  will continue her on intravenous Lasix for now. Monitor strict  I.'s and O.'s. She is still significantly wheezing, so we will  increase her Solu-Medrol to 60 mg every 6 hours. Continue her  mucolytics and encourage flutter valve. She has been started on  azithromycin for antibiotic coverage. Her last MBSS was completed  in April 2015 with recommendation for D3/thin; pt did aspirate  nectar-thick liquids during the study, but safely tolerated small  sips thin.  Type of Study: Modified Barium Swallowing Study Reason for Referral: Objectively evaluate swallowing function Previous Swallow Assessment: 12/14/2013: MBSS D3/thin; single sips  only no straw Diet Prior to this Study: Dysphagia 3 (soft);Honey-thick liquids Temperature Spikes Noted: No Respiratory  Status: Nasal cannula History of Recent Intubation: No Behavior/Cognition: Alert;Cooperative;Pleasant mood Oral Cavity - Dentition: Adequate natural dentition Oral Motor / Sensory Function: Within functional limits Self-Feeding Abilities: Needs assist (vision impairment) Patient Positioning: Upright in chair Baseline Vocal Quality: Clear Volitional Cough: Strong;Congested Volitional Swallow: Able to elicit Anatomy: Within functional limits Pharyngeal Secretions: Not observed secondary MBS    Reason for Referral Objectively evaluate swallowing function   Oral Phase Oral Preparation/Oral Phase Oral Phase: WFL   Pharyngeal Phase Pharyngeal Phase Pharyngeal Phase: Impaired Pharyngeal - Nectar Pharyngeal - Nectar Cup: Premature spillage to  valleculae;Pharyngeal residue - valleculae (mild lingual residue  post swallow pools in valleculae but cl) Pharyngeal - Thin Pharyngeal - Thin Teaspoon: Within functional limits Pharyngeal - Thin Cup: Premature spillage to valleculae Pharyngeal - Thin Straw: Premature spillage to  valleculae;Premature spillage to pyriform  sinuses;Penetration/Aspiration during swallow;Pharyngeal residue  - valleculae Penetration/Aspiration details (thin straw): Material does not  enter airway;Material enters airway, remains ABOVE vocal cords  then ejected out;Material enters airway, CONTACTS cords then  ejected out Pharyngeal - Solids Pharyngeal - Puree: Within functional limits Pharyngeal - Mechanical Soft: Within functional limits Pharyngeal - Pill: Within functional limits  Cervical Esophageal Phase    Thank you,  Genene Churn, CCC-SLP [phone]     Cervical Esophageal Phase Cervical Esophageal Phase: [facility]         PORTER,DABNEY 04/18/2014, 2:20 PM     Scheduled Meds: . allopurinol  200 mg Oral Daily  . amLODipine  5 mg Oral Daily  . antiseptic oral rinse  7 mL Mouth Rinse q12n4p  .  aspirin EC  81 mg Oral Daily  . azithromycin  500 mg Oral QHS  . chlorhexidine  15 mL Mouth Rinse BID  .  enoxaparin (LOVENOX) injection  30 mg Subcutaneous Q24H  . fenofibrate  54 mg Oral Daily  . guaiFENesin  1,200 mg Oral BID  . insulin aspart  0-20 Units Subcutaneous TID WC  . insulin aspart  0-5 Units Subcutaneous QHS  . insulin aspart  4 Units Subcutaneous TID WC  . [START ON 04/20/2014] ipratropium-albuterol  3 mL Nebulization TID  . pantoprazole  40 mg Oral BID  . PARoxetine  20 mg Oral BH-q7a  . polyvinyl alcohol  1 drop Both Eyes BID  . [START ON 04/20/2014] predniSONE  60 mg Oral Q breakfast  . simvastatin  20 mg Oral q1800  . torsemide  80 mg Oral Daily   Continuous Infusions:   Principal Problem:   Acute respiratory failure with hypoxia Active Problems:   Essential hypertension, benign   Type II or unspecified type diabetes mellitus without mention of complication, not stated as uncontrolled   COPD exacerbation   Acute on chronic diastolic CHF (congestive heart failure)   Acute renal insufficiency   Chronic kidney disease (CKD), stage IV (severe)    Time spent: 97mins    MEMON,JEHANZEB  [facility] Pager [phone]. If 7PM-7AM, please contact night-coverage at [email or web address], password [facility] 04/19/2014, 8:00 PM  LOS: 5 days

## 2014-04-19 NOTE — Progress Notes (Signed)
Patient stated that she didn't want to wear BIPAP tonight during 20:00 treatment. Patient resting well at this time. Hospital unit still at bedside if needed.

## 2014-04-20 ENCOUNTER — Inpatient Hospital Stay
Admission: RE | Admit: 2014-04-20 | Discharge: 2014-11-13 | Disposition: A | Discharge status: Nursing Home (Includes ICF) | Payer: Medicare Other | Source: Ambulatory Visit | Attending: Internal Medicine | Admitting: Internal Medicine

## 2014-04-20 DIAGNOSIS — R059 Cough, unspecified: Secondary | ICD-10-CM

## 2014-04-20 DIAGNOSIS — I509 Heart failure, unspecified: Secondary | ICD-10-CM

## 2014-04-20 DIAGNOSIS — R609 Edema, unspecified: Secondary | ICD-10-CM

## 2014-04-20 DIAGNOSIS — R635 Abnormal weight gain: Secondary | ICD-10-CM

## 2014-04-20 DIAGNOSIS — R05 Cough: Secondary | ICD-10-CM

## 2014-04-20 DIAGNOSIS — R0989 Other specified symptoms and signs involving the circulatory and respiratory systems: Secondary | ICD-10-CM

## 2014-04-20 LAB — BASIC METABOLIC PANEL
Anion gap: 15 (ref 5–15)
BUN: 69 mg/dL — AB (ref 6–23)
CO2: 33 mEq/L — ABNORMAL HIGH (ref 19–32)
Calcium: 9.1 mg/dL (ref 8.4–10.5)
Chloride: 93 mEq/L — ABNORMAL LOW (ref 96–112)
Creatinine, Ser: 2.01 mg/dL — ABNORMAL HIGH (ref 0.50–1.10)
GFR calc Af Amer: 24 mL/min — ABNORMAL LOW (ref >90)
GFR, EST NON AFRICAN AMERICAN: 21 mL/min — AB (ref >90)
Glucose, Bld: 224 mg/dL — ABNORMAL HIGH (ref 70–99)
POTASSIUM: 3.5 meq/L — AB (ref 3.7–5.3)
SODIUM: 141 meq/L (ref 137–147)

## 2014-04-20 LAB — CBC WITH DIFFERENTIAL/PLATELET
BASOS PCT: 0 % (ref 0–1)
Basophils Absolute: 0 10*3/uL (ref 0.0–0.1)
EOS ABS: 0 10*3/uL (ref 0.0–0.7)
Eosinophils Relative: 0 % (ref 0–5)
HCT: 35.1 % — ABNORMAL LOW (ref 36.0–46.0)
Hemoglobin: 11.2 g/dL — ABNORMAL LOW (ref 12.0–15.0)
Lymphocytes Relative: 12 % (ref 12–46)
Lymphs Abs: 1.3 10*3/uL (ref 0.7–4.0)
MCH: 28.3 pg (ref 26.0–34.0)
MCHC: 31.9 g/dL (ref 30.0–36.0)
MCV: 88.6 fL (ref 78.0–100.0)
Monocytes Absolute: 1 10*3/uL (ref 0.1–1.0)
Monocytes Relative: 9 % (ref 3–12)
NEUTROS PCT: 79 % — AB (ref 43–77)
Neutro Abs: 8.3 10*3/uL — ABNORMAL HIGH (ref 1.7–7.7)
PLATELETS: 231 10*3/uL (ref 150–400)
RBC: 3.96 MIL/uL (ref 3.87–5.11)
RDW: 15.2 % (ref 11.5–15.5)
WBC: 10.6 10*3/uL — ABNORMAL HIGH (ref 4.0–10.5)

## 2014-04-20 LAB — GLUCOSE, CAPILLARY
Glucose-Capillary: 181 mg/dL — ABNORMAL HIGH (ref 70–99)
Glucose-Capillary: 146 mg/dL — ABNORMAL HIGH (ref 70–99)

## 2014-04-20 NOTE — Clinical Social Work Note (Signed)
Pt d/c today back to [facility]. Pt, pt's daughter Fraser Din, and facility aware and agreeable. D/C summary faxed. Pt to transfer with RN.  Benay Pike, [place]

## 2014-04-20 NOTE — Progress Notes (Signed)
PROGRESS NOTE  [name] TDD:[id number] DOB: 1922/05/15 DOA: 04/14/2014 PCP: Cyndee Brightly, MD Cardiologist Dr. Domenic Polite  Summary: 80 year old woman with history of chronic diastolic heart failure managed conservatively, moderate mitral valve stenosis who presented with several day history of worsening shortness of breath, dyspnea on exertion and peripheral edema increasing. She was placed on BiPAP in the emergency department with improvement and admitted for COPD exacerbation and acute on chronic diastolic heart failure. Condition gradually improved with treatment of COPD and with diuresis. Hypoxia has stabilized although she continues to require nasal cannula. Appears to be euvolemic at this time. Plan discharge on nasal cannula oxygen, wean as tolerated, BiPAP QHS.  Assessment/Plan: 1. Acute hypoxic respiratory failure multifactorial, secondary to COPD exacerbation, acute diastolic CHF. Aspiration was questioned, however patient improved spontaneously without treatment of aspiration. 2. COPD exacerbation. Exacerbation has resolved. 3. Acute on chronic diastolic congestive heart failure complicated by moderate mitral stenosis. Currently stable. 4. Dysphagia. Diet planned as per speech therapy. 5. Chronic kidney disease stage IV. Stable. Elevated BUN may be secondary to steroids. 6. Diabetes mellitus type 2. Stable.  7. Mild dementia. Stable.      Respiratory status appears stable at this time. Discussed with daughter, patient at bedside. Plan discharge on nasal cannula oxygen, wean as tolerated.  BiPAP QHS as tolerated, per RT 10/5 with 35% FiO2 bleed-in.  Follow-up CKD as an outpatient  Follow-up diastolic CHF with PCP (see cardiology note last office visit)  Murray Hodgkins, MD  [facility]  Pager [phone] If 7PM-7AM, please contact night-coverage at [email or web address], password [facility] 04/20/2014, 12:45 PM  LOS: 6 days   Consultants: Speech Therapy: Diet  recommendations: Dysphagia 3 (mechanical soft);Thin liquid;Other(comment) (small sips/bites; upright for meals)  Liquids provided via: Cup  Medication Administration: Whole meds with puree  Supervision: Full supervision/cueing for compensatory strategies;Staff to assist with self feeding  Compensations: Slow rate;Small sips/bites;Multiple dry swallows after each bite/sip  Postural Changes and/or Swallow Maneuvers: Out of bed for meals;Seated upright 90 degrees;Upright 30-60 min after meal   Procedures:  8/17 MBS  Antibiotics:  Azithromycin 8/14 >> 8/18  HPI/Subjective: "I feel great". No complaints. Breathing well. Tolerating diet.  Objective: Filed Vitals:   04/19/14 2300 04/20/14 0636 04/20/14 0705 04/20/14 1201  BP:  152/56    Pulse:  87    Temp:  98.7 F (37.1 C)    TempSrc:  Oral    Resp:  14    Height:      Weight: 97.2 kg (214 lb 4.6 oz) 97.2 kg (214 lb 4.6 oz)    SpO2:  99% 98% 92%    Intake/Output Summary (Last 24 hours) at 04/20/14 1245 Last data filed at 04/20/14 0800  Gross per 24 hour  Intake    960 ml  Output      0 ml  Net    960 ml     Filed Weights   04/19/14 0437 04/19/14 2300 04/20/14 0636  Weight: 97 kg (213 lb 13.5 oz) 97.2 kg (214 lb 4.6 oz) 97.2 kg (214 lb 4.6 oz)    Exam:     Afebrile, vital signs stable. Stable hypoxia on 3 L. Gen. Appears calm and comfortable. Sitting in chair.  Psych. Alert. Speech fluent and clear.  Cardiovascular. Regular rate and rhythm. 2/6 systolic murmur best heard at left upper sternal border; no rub or gallop. No lower extremity edema. Telemetry sinus rhythm with brief SVT.  Respiratory. Clear to auscultation bilaterally, no wheezes, rales or rhonchi. Normal respiratory  effort.  Data Reviewed: I/O: weight stable. BM x1  Chemistry: Blood sugars stable. Potassium 3.5. BUN stable 69, creatinine stable 2.01.  Heme: Hemoglobin stable, 11.2.  Scheduled Meds: . allopurinol  200 mg Oral Daily  . amLODipine   5 mg Oral Daily  . antiseptic oral rinse  7 mL Mouth Rinse q12n4p  . aspirin EC  81 mg Oral Daily  . azithromycin  500 mg Oral QHS  . chlorhexidine  15 mL Mouth Rinse BID  . enoxaparin (LOVENOX) injection  30 mg Subcutaneous Q24H  . fenofibrate  54 mg Oral Daily  . guaiFENesin  1,200 mg Oral BID  . insulin aspart  0-20 Units Subcutaneous TID WC  . insulin aspart  0-5 Units Subcutaneous QHS  . insulin aspart  4 Units Subcutaneous TID WC  . ipratropium-albuterol  3 mL Nebulization TID  . pantoprazole  40 mg Oral BID  . PARoxetine  20 mg Oral BH-q7a  . polyvinyl alcohol  1 drop Both Eyes BID  . predniSONE  60 mg Oral Q breakfast  . simvastatin  20 mg Oral q1800  . torsemide  80 mg Oral Daily   Continuous Infusions:   Principal Problem:   Acute respiratory failure with hypoxia Active Problems:   Essential hypertension, benign   Type II or unspecified type diabetes mellitus without mention of complication, not stated as uncontrolled   COPD exacerbation   Acute on chronic diastolic CHF (congestive heart failure)   Acute renal insufficiency   Chronic kidney disease (CKD), stage IV (severe)   Time spent 25 minutes

## 2014-04-20 NOTE — Progress Notes (Signed)
UR review complete.  

## 2014-04-20 NOTE — Discharge Summary (Signed)
Brief addendum to Dr. Blythe Stanford discharge summary from 8/18:   Respiratory status appears stable at this time. Discussed with daughter, patient at bedside.   Plan discharge on nasal cannula oxygen (for acute hypoxic respiratory failure), wean as tolerated.   Recommend BiPAP QHS, per RT 10/5 with 35% FiO2 bleed-in.   Consider referral to pulmonology if clinically indicated  Follow-up CKD as an outpatient   Follow-up diastolic CHF with PCP (see cardiology note last office visit)  Murray Hodgkins, MD [facility]

## 2014-04-20 NOTE — Progress Notes (Signed)
Report called to [facility] at [facility].  Patient to return to room 150 this afternoon.

## 2014-04-20 NOTE — Progress Notes (Signed)
Speech Language Pathology Treatment: Dysphagia  Patient Details Name: MAKESHIA SEAT MRN: [id number] DOB: Jul 22, 1922 Today's Date: 04/20/2014 Time: 1205-1225 SLP Time Calculation (min): 20 min  Assessment / Plan / Recommendation Clinical Impression  Skilled observation of Dysphagia 3 (Mechanical soft diet) with thin liquids with minimal cueing required for small bites/sips; pt education provided re: aspiration risk and results of MBSS reiterated; shared information with daughter, Jenny Reichmann who was present for dysphagia tx as well; pt without overt s/s of aspiration noted during skilled observation with small bites/sips observed throughout portion of lunch tray with only cues 20% of the time needed; reviewed aspiration risk with daughter and she understands/agrees with tx plan; discussed implementation of honey-thickened liquids as needed if wheezing continues; pt needed encouragement initially to consume tray d/t "weakness" per pt; did not eat breakfast tray   HPI HPI: Ms. Sublett is an 80 yo female known to me from previous swallow studies and has been admitted with worsening shortness of breath, acute respiratory failure with hypoxia, likely multifactorial. She has been residing at [facility]. She briefly required BiPAP, but is currently on nasal cannula. ABG from this morning appears to be acceptable. She likely has a COPD exacerbation evident by significant wheezing and prolonged expiratory phase. Chest x-ray indicated mild acute on chronic diastolic congestive heart failure. Her BNP is elevated above baseline. She received 2 doses of intravenous Lasix thus far, we will continue her on intravenous Lasix for now. Monitor strict I.'s and O.'s. She is still significantly wheezing, so we will increase her Solu-Medrol to 60 mg every 6 hours. Continue her mucolytics and encourage flutter valve. She has been started on azithromycin for antibiotic coverage. Her last MBSS was completed in April 2015 with  recommendation for D3/thin; pt did aspirate nectar-thick liquids during the study, but safely tolerated small sips thin.    Pertinent Vitals Pain Assessment: 0-10 Pain Score: 0-No pain  SLP Plan  Continue with current plan of care    Recommendations Diet recommendations: Dysphagia 3 (mechanical soft);Thin liquid;Other(comment) (small sips/bites; upright for meals) Liquids provided via: Cup Medication Administration: Whole meds with puree Supervision: Full supervision/cueing for compensatory strategies;Staff to assist with self feeding Compensations: Slow rate;Small sips/bites;Multiple dry swallows after each bite/sip Postural Changes and/or Swallow Maneuvers: Out of bed for meals;Seated upright 90 degrees;Upright 30-60 min after meal              Follow up Recommendations: Skilled Nursing facility Plan: Continue with current plan of care         ADAMS,PAT, M.S., CCC-SLP 04/20/2014, 12:44 PM

## 2014-04-21 ENCOUNTER — Non-Acute Institutional Stay (SKILLED_NURSING_FACILITY): Payer: Medicare Other | Admitting: Internal Medicine

## 2014-04-21 DIAGNOSIS — J441 Chronic obstructive pulmonary disease with (acute) exacerbation: Secondary | ICD-10-CM

## 2014-04-21 DIAGNOSIS — N184 Chronic kidney disease, stage 4 (severe): Secondary | ICD-10-CM

## 2014-04-21 DIAGNOSIS — I5032 Chronic diastolic (congestive) heart failure: Secondary | ICD-10-CM

## 2014-04-21 DIAGNOSIS — I10 Essential (primary) hypertension: Secondary | ICD-10-CM

## 2014-04-21 NOTE — Progress Notes (Signed)
Patient ID: [name], female   DOB: 1922-04-01, 80 y.o.   MRN: [id number]   This is an acute visit.  Level of care skilled.  Facility [facility].  Chief complaint-acute visit status post hospitalization for acute respiratory failure with history of COPD.  History of present illness.  Patient is a pleasant 80 year old female with a history of COPD-at the facility she had progressive shortness of breath and was found to be in acute respiratory failure likely related to her COPD as well as an element of CHF.  She was treated with IV steroids antibiotics and dilators-she is now on a prednisone taper.  Physical also noted to have mild volume overload thought secondary to acute on chronic diastolic CHF-she was diureses with IV Lasix with good response.  Her edema apparently improved.  She was thought to have dysphagia with aspirating on thin liquids-she was seen by speech therapy who did a modified barium swallow and recommendations after the study was that she be continued on dysphagia 3 diet with thin liquids-she did not surprisingly show aspiration with thin liquids.  Encourage sitting upright during meals and eating and drinking slowly and not talk during meals.  She is returned to the facility says she is feeling and breathing significantly better although continues to be quite fragile individual in this regard.  Past Medical History:  Past Medical History   Diagnosis  Date   .  Breast cancer    .  Essential hypertension, benign    .  Depression    .  GERD (gastroesophageal reflux disease)    .  Hypercholesteremia    .  CKD (chronic kidney disease) stage 3, GFR 30-59 ml/min    .  Pneumonia    .  UTI (lower urinary tract infection)    .  Arthritis    .  Asthma    .  Venous stasis    .  Type 2 diabetes mellitus    .  Chronic diastolic heart failure      LVEF 70%   .  Mitral stenosis      Moderate   .  Secondary pulmonary hypertension      PASP 64 mmHg    Past Surgical  History   Procedure  Laterality  Date   .  Replacement total knee     .  Mastectomy     .  Total hip arthroplasty     .  Carotid endarterectomy     .  Cataracts     Medications:    Medication  Sig  Start Date  End Date  Taking?  Authorizing Provider   acetaminophen (TYLENOL) 325 MG tablet  Take 650 mg by mouth every 6 (six) hours as needed.    Yes  Historical Provider, MD   albuterol (PROVENTIL HFA;VENTOLIN HFA) 108 (90 BASE) MCG/ACT inhaler  Inhale 2 puffs into the lungs every 6 (six) hours as needed for wheezing or shortness of breath.    Yes  Historical Provider, MD   allopurinol (ZYLOPRIM) 100 MG tablet  TAKE (2) TABLETS BY MOUTH ONCE DAILY.  08/07/13   Yes  Mikey Kirschner, MD   alum & mag hydroxide-[facility]) 200-200-20 MG/5ML suspension  Take by mouth every 4 (four) hours as needed for indigestion or heartburn.    Yes  Historical Provider, MD   ASPIRIN LOW DOSE 81 MG EC tablet  TAKE 1 TABLET BY MOUTH ONCE DAILY.  10/25/13   Yes  Mikey Kirschner, MD  Cranberry 475 MG CAPS  Take 1 capsule by mouth every 12 (twelve) hours.    Yes  Historical Provider, MD   fenofibrate 54 MG tablet  Take 1 tablet (54 mg total) by mouth daily.  12/14/13   Yes  Lezlie Octave Black, NP   guaifenesin (TUSSIN) 100 MG/5ML syrup  Take 200 mg by mouth every 6 (six) hours as needed for cough.    Yes  Historical Provider, MD   hydrALAZINE (APRESOLINE) 25 MG tablet   12/15/13   Yes  Historical Provider, MD   HydrOXYzine HCl (ATARAX PO)  Take 12.5 mg by mouth 4 (four) times daily as needed.    Yes  Historical Provider, MD   ipratropium (ATROVENT) 0.02 % nebulizer solution   12/15/13   Yes  Historical Provider, MD   metoprolol (LOPRESSOR) 50 MG tablet  TAKE 1 TABLET BY MOUTH TWICE DAILY.  11/08/13   Yes  Mikey Kirschner, MD   NOVOLOG 100 UNIT/ML injection   01/24/14   Yes  Historical Provider, MD   omeprazole (PRILOSEC) 20 MG capsule  Take 20 mg by mouth daily.    Yes  Historical Provider, MD   oxybutynin (DITROPAN-XL) 5  MG 24 hr tablet  Take 1 tablet (5 mg total) by mouth at bedtime.  09/06/13   Yes  Mikey Kirschner, MD   PARoxetine (PAXIL) 20 MG tablet  Take 20 mg by mouth every morning.    Yes  Historical Provider, MD   Polyethyl Glycol-Propyl Glycol (SYSTANE OP)  Place 1 drop into both eyes 2 (two) times daily.    Yes  Historical Provider, MD   PROAIR HFA 108 (90 BASE) MCG/ACT inhaler  INHALE 2 PUFFS INTO LUNGS EVERY 6 HOURS AS NEEDED FOR WHEEZING.  10/27/13   Yes  Mikey Kirschner, MD   simvastatin (ZOCOR) 20 MG tablet  TAKE 1 TABLET BY MOUTH AT BEDTIME.  11/08/13   Yes  Mikey Kirschner, MD   Spacer/Aero-Holding Chambers (AEROCHAMBER MV) inhaler  Use as instructed. 2 puffs BID  09/28/13   Yes  Mikey Kirschner, MD   torsemide (DEMADEX) 20 MG tablet  Take 80 mg by mouth daily. Take 4 tablets to equal 80 mg total daily    Yes  Historical Provider, MD   diphenhydrAMINE (BENADRYL) 25 MG tablet  Take 25 mg by mouth every 6 (six) hours as needed for itching.     Historical Provider, MD   pantoprazole (PROTONIX) 40 MG tablet   12/02/13    Historical Provider, MD   potassium chloride SA (K-DUR,KLOR-CON) 20 MEQ tablet   11/22/13    Historical Provider, MD   triamcinolone cream (KENALOG) 0.1 %   01/14/14    Historical Provider, MD   Allergies:  Allergies   Allergen  Reactions   .  Carbapenems  Other (See Comments)     unknown   .  Cephalosporins  Other (See Comments)     unknown   .  Amoxicillin  Rash   .  Penicillins  Rash   Social History:  reports that she has never smoked. She has never used smokeless tobacco. She reports that she does not drink alcohol or use illicit drugs.  Family History:  Family History   Problem  Relation  Age of Onset   .  Asthma  Other    .  Diabetes  Other      review of systems.  In general says she feels significantly better does not  complain of fever or chills.  Skin no complaints of rashes or itching.  Head ears eyes nose mouth and throat-no complaints of sore throat or visual  changes or nasal discharge.  Respiratory-is not complaining of shortness of breath--still has a cough which is somewhat chronic.  Cardiac does not complaining of chest pain has chronic lower extremity edema venous stasis changes.  GI does not complaining of any nausea vomiting diarrhea constipation or abdominal pain.  Muscle skeletal does not complaining of joint pain or swelling.  Neurologic is not complaining of dizziness or headaches or syncopal type episodes.  Psych does have some history of dementia is not complaining of depression or anxiety appears comfortable.  Physical exam.  Temperature is 97.3 pulse 56 respirations 20 blood pressure 129/76.  General this is a pleasant elderly female in no distress sitting comfortably in her wheelchair.  Her skin is warm and dry she has diffuse solar changes on her face back most prominently.  Eyes pupils appear reactive to light visual acuity intact sclera and conjunctiva clear.  Oropharynx clear mucous membranes moist.  Chest she has reduced breath sounds some bronchial sounds on expiration there is no labored breathing    heart is regular rate and rhythm without murmur gallop or rub she has one-2+ lower extremity edema although there is only trace edema of her feet bilaterally  Abdomen is obese soft nontender with positive bowel sounds.  Exoskeletal ambulates in a wheelchair moves all extremities x4 I do not note any deformities other than arthritic changes.  Neurologic appears grossly intact no lateralizing findings speech is clear.  Psych she is alert and oriented to self pleasant and appropriate  Labs.  04/21/2014.  WBC 10.5 hemoglobin 11.0 platelets 192.  Sodium 141 potassium 3.4 BUN 64 creatinine 1.4-CO2 level 33  Albumin 3.0 otherwise liver function tests within normal  04/20/2014.  WBC 10.6 hemoglobin 11.2 platelets 231.  Sodium 141 potassium 3.5 BUN 69 creatinine 2.01.  04/16/2014.  Liver function tests  within normal limits albumin of 3.5.  04/15/2014.  BNP 2691  Assessment and plan.  #1 as respiratory failure with history of COPD exasperation-she appears to be doing better now past this is somewhat of a challenging situation nonetheless she appears stable today-she continues on a prednisone taper as well as nebulizers and albuterol inhaler when necessary as well as AeroChamber inhaler daily.--Also on Mucinex twice a day  #2 history of CHF-she is on Demadex--note potassium is a bit low will supplement this with a small dose and update meta bolic panel on Monday, August 24--.   monitor weights   #3-hypertension-at this point appears to be stable she is on Norvasc as well as Lopressor daily notify provider gain greater than 3 pounds  #4-history of gout as this appears to be asymptomatic she is on allopurinol.  #5-history of hyperlipidemia she is on a statin as well as fenofibrate.  #6-depression this appears stable she is on Paxil.  #7-history of GERD-he is on a proton pump inhibitor.  #8 has history of chronic kidney disease this appears to be at baseline and will update  metabolic panel.  #9 past history of anemia will update CBC recent hemoglobins have hovered around 11 suspect this is chronic disease area  [id number]-of note more than 40 minutes spent assessing patient-reviewing her chart-and coordinating and formulating a plan of care for numerous diagnoses-of note greater than 50% of time spent coordinating plan of care  .

## 2014-04-24 ENCOUNTER — Encounter: Payer: Self-pay | Admitting: Internal Medicine

## 2014-04-25 ENCOUNTER — Non-Acute Institutional Stay (SKILLED_NURSING_FACILITY): Payer: Medicare Other | Admitting: Internal Medicine

## 2014-04-25 DIAGNOSIS — J441 Chronic obstructive pulmonary disease with (acute) exacerbation: Secondary | ICD-10-CM

## 2014-04-25 DIAGNOSIS — N184 Chronic kidney disease, stage 4 (severe): Secondary | ICD-10-CM

## 2014-04-25 DIAGNOSIS — I5033 Acute on chronic diastolic (congestive) heart failure: Secondary | ICD-10-CM

## 2014-04-25 DIAGNOSIS — I509 Heart failure, unspecified: Secondary | ICD-10-CM

## 2014-04-27 NOTE — Progress Notes (Addendum)
Patient ID: [name], female   DOB: 02-07-1922, 80 y.o.   MRN: [id number]               HISTORY & PHYSICAL  DATE:  04/25/2014        FACILITY: [facility]    LEVEL OF CARE:   SNF   CHIEF COMPLAINT:   Admission to the facility, post stay at [facility], 04/14/2014 through 04/19/2014.    HISTORY OF PRESENT ILLNESS:  The patient was admitted to hospital with progressive shortness of breath.   She was found to have acute  respiratory failure related to a COPD exacerbation, ?some aspiration and some element of congestive heart failure.  She was discharged on 2-3 L of oxygen.  She  apparently now is on a BiPAP, as well, which is new.  She had a swallowing study that resulted in her being discharged on a dysphagia III diet with thin liquids.  She was diuresed with IV Lasix with improvement.    LABORATORY DATA:  Lab work from 04/20/2014 showed a BUN of 69 and a creatinine of 2.01.   Her baseline creatinine appears to be in the 1.6 range.    Hemoglobin was 11.2, white count 10.6.    Chest x-ray suggested mild congestive heart failure with extensive chronic calcified mediastinal and hilar lymphadenopathy.    Lab work from today shows a BUN of 56, creatinine 1.81.    White count is 11.9, hemoglobin 10.4.    PAST MEDICAL HISTORY/PROBLEM LIST:   Includes:    Arthritis.    Essential hypertension.     Hyperlipidemia.    Type 2 diabetes.    Chronic COPD.    Dysphagia.    Chronic diastolic heart failure.    Mitral stenosis.     Urinary frequency.    Renovascular hypertension.     Chronic renal failure stage IV.    CURRENT MEDICATIONS:  Discharge medications include:       Ditropan XL 5 mg daily.    Paxil 20 q.d.    Prednisone taper from 60 mg.    Simvastatin 20 q.d.    Demadex 80 q.d.    Guaifenesin 600 b.i.d.     SOCIAL HISTORY: HOUSING:  The patient is a longstanding resident here.   CODE STATUS:  The patient is a DNR.      REVIEW OF SYSTEMS:    GENERAL:   The patient states she feels generally weak, but otherwise well.   CHEST/RESPIRATORY:   She is not coughing.   CARDIAC:   No clear chest pain or palpitations.   GI:  No diarrhea.   MUSCULOSKELETAL:  States she has not been up walking.       PHYSICAL EXAMINATION:   VITAL SIGNS:   O2 SATURATIONS:  95% on 2 L.   RESPIRATIONS:  20.   PULSE:  76.    GENERAL APPEARANCE:  The patient is alert and conversational.   CHEST/RESPIRATORY:   Poor air entry bilaterally.  Mild end-expiratory wheeze.   CARDIOVASCULAR:  CARDIAC:  Heart sounds are distant.  There are no murmurs.  JVP is not elevated.   There is no coccyx edema.   CIRCULATION:   EDEMA/VARICOSITIES:  Extremities:  Some degree of venous stasis.   Minimal edema.   GASTROINTESTINAL:  ABDOMEN:   No tenderness.  No masses.   LIVER/SPLEEN/KIDNEYS:  No liver, no spleen.    ASSESSMENT/PLAN:  Diastolically-mediated heart failure.  She was just diuresed in the hospital, but  comes back on less diuretic, 80 once a day of Demadex, than she was on before she went out (twice a day).  It would appear that her weight was 212 pounds on 04/21/2014 and 216 pounds on 04/22/2014.  She is listed as being 221 pounds on 04/06/2014 before she went to the hospital.    COPD acute.  She is on tapering steroids.   Now on oxygen.  She has a BiPAP at night.  I am not completely certain she needs the BiPAP.    Chronic renal insufficiency.  This does not appear to be unstable.    She is going to need careful clinical follow-up.      CPT CODE: 18403

## 2014-05-06 ENCOUNTER — Ambulatory Visit (HOSPITAL_COMMUNITY): Payer: Medicare Other | Attending: Internal Medicine

## 2014-05-06 ENCOUNTER — Non-Acute Institutional Stay (SKILLED_NURSING_FACILITY): Payer: Medicare Other | Admitting: Internal Medicine

## 2014-05-06 ENCOUNTER — Encounter: Payer: Self-pay | Admitting: Internal Medicine

## 2014-05-06 DIAGNOSIS — N184 Chronic kidney disease, stage 4 (severe): Secondary | ICD-10-CM

## 2014-05-06 DIAGNOSIS — E119 Type 2 diabetes mellitus without complications: Secondary | ICD-10-CM

## 2014-05-06 DIAGNOSIS — I5032 Chronic diastolic (congestive) heart failure: Secondary | ICD-10-CM

## 2014-05-06 DIAGNOSIS — R635 Abnormal weight gain: Secondary | ICD-10-CM

## 2014-05-06 DIAGNOSIS — D696 Thrombocytopenia, unspecified: Secondary | ICD-10-CM

## 2014-05-06 DIAGNOSIS — R609 Edema, unspecified: Secondary | ICD-10-CM | POA: Diagnosis not present

## 2014-05-06 LAB — GLUCOSE, CAPILLARY
GLUCOSE-CAPILLARY: 427 mg/dL — AB (ref 70–99)
Glucose-Capillary: 301 mg/dL — ABNORMAL HIGH (ref 70–99)
Glucose-Capillary: 380 mg/dL — ABNORMAL HIGH (ref 70–99)

## 2014-05-06 NOTE — Progress Notes (Signed)
Patient ID: [name], female   DOB: 1922/04/30, 81 y.o.   MRN: [id number]   This is an acute visit.  Level of care skilled.  Facility [facility].  Chief complaint-acute visit secondary to multiple issues including weight gain-lethargy-elevated blood sugars.  History of present illness.  Patient is a pleasant 80 year old female with a complicated medical history including recent hospitalization for respiratory issues out multi-factorial combination of COPD exasperation aspiration and some element of CHF.  She was treated with IV steroids antibiotics and dilators and has completed a prednisone taper.  Also thought to have volume overload diuresed with IV Lasix in continues on Demadex 80 mg a day.  Nursing staff has been monitoring her weights there appears to be some variability but yesterday weight was almost 223 which was it appears about 4 pounds more than her recent baseline.  She did get an extra dose of Demadex 20 mg yesterday afternoon her weight today is 220.9 which is down about 2 pounds from yesterday.  Her nurse feels that her edema looks a bit better today-she is not complaining of any shortness of breath or difficulty breathing with exertion although she is a poor historian.  Another issue is her history of diabetes type 2-this is been largely diet-controlled however metabolic panel showed an elevated glucose today and a CBG was taken which was 427.  Marland Kitchen  Her vital signs continued to be stable I do see occasional pulse is in the 50s but she is asymptomatic she is on Lopressor twice a day-50 mg twice a day.  Her daughter thought she was somewhat more lethargic this morning but apparently when I evaluated her she appeared be more at her baseline her daughter says she did improve throughout the day apparently she does have periods of this.  Again with her elevated blood sugar one would be somewhat cognizant about possible underlying infection.  Family medical social  history as been reviewed from previous progress notes including 04/25/2014 and 04/21/2014.  Medications have been reviewed per MAR.  Review of systems-somewhat limited since patient is a poor historian.  In general no complaints of fevers or chills.  Respiratory does not complaining of shortness of breath or cough although she does have a chronic cough which apparently is relatively unchanged.  Cardiac does not complaining of chest pain has fairly significant lower extremity edema and venous stasis changes this appears to be fairly significant but baseline.  GI does not complaining of abdominal discomfort nausea or vomiting I note she ate her supper this evening quite well.  GU is not complaining specifically of dysuria.  Muscle skeletal is not complaining of joint pain does have some weakness which is baseline.  Neurologic does not complaining of dizziness headache or syncopal-type feelings.  Psych some history of dementia.  Physical exam.  Temperature is 97.5 pulse initially was low 50s however on reevaluation later this evening  was in the low 60s s respirations 18--blood pressure has variability 143/58-148/64-104/60-recently.  In general this is a pleasant elderly female in no distress sitting comfortably in her wheelchair.  Her skin is warm and dry with diffuse sun-induced changes the skin on her face and back.  Eyes pupils reactive to light sclera and conjunctiva clear visual acuity appears grossly intact.  Oropharynx clear mucous membranes moist.  Chest she has reduced breath sounds but I cannot really appreciate wheezing or congestion she has no labored breathing.  Heart is regular rate and rhythm with occasional irregular beats-continues with 2+ lower extremity  edema and venous stasis changes.  Abdomen is obese soft nontender positive bowel sounds.  Muscle skeletal ambulates in a wheelchair moves all extremities at baseline.  Neurologic is grossly intact her speech  is clear grip strength appears to be equal I do not see any lateralizing findings.  Psych she is oriented to self pleasant appropriate although confused.  Labs.  05/06/2014.  Sodium 135 potassium 4.3 BUN 48 creatinine 1.88.  Of note glucose a metabolic panel was 026-VZCHYIFOYD CBG was 4.7.  05/02/2014.  Sodium 140 potassium 3.7 BUN 53 creatinine 1.65. On the 20th 2015.  WBC 10.5-hemoglobin 11.0-platelets 192  Assessment and plan.  #1-history of weight gain-at one point she had been on Demadex 80 mg twice a day-appears her weight has crept up a bit although not precipitously so-at this point will continue the Demadex 80 mg in the morning additional 20 mg later in the day- is on potassium supplementation-will need to update this on Monday for followup her creatinine has gone up a bi from lab done a few days ago but still relatively at baseline this will bear monitoring.  #2 chronic renal insufficiency-of note above this will have to be followed her creatinine and BUN appears to be relatively close to her recent baseline--  #3-diabetes type 2-this appears to have progressed-again she had been on prednisone which can contribute to this-Will put her on a loose sliding scale starting at 2 units of Humalog starting at 200.  Also discussed this with Dr. Dellia Nims via phone and will start low-dose Lantus 5 units each bedtime.  Monitor blood sugars a.c. and at bedtime with no sliding scale coverage at at bedtime but notify provider if greater than 300.  Hemoglobin A1c back in June was 7.1-we will update this next week as well  Also for the time being will obtain 3 AM blood sugars to ensure stability.  #4-with elevated glucose-concern here for possible infection we'll order a CBC with differential stat as well as a UA CNS and with a respiratory issues in the past also will order a chest x-ray.--Also monitor vital signs pulse ox every shift.  #5-on CBC I do note platelets of 113,000. On previous  CBC her platelets were 176,000 192,000-will update this early next week as well     Addendum.  We have obtained the updated CBC which shows a mildly elevated white count of 11.4 with granulocytes elevated at 8.9-again a UA C and' S and chest x-ray are pending-.  I did reevaluate her later in the evening and she was stable actually again had eaten  most of her dinner which was encouraging pulse rate was in the 60s she appears to be comfortable certainly no sign of distress at this point  Her pre-dinner CBG was down to 300 again we will be monitoring this with sliding scale as well as initiation of low-dose Lantus  Of note ater in  the evening obtain results of the chest x-ray which did not show any acute cardiopulmonary process which is encouraging-again she does appear to be stable but has certainly multiple comorbidities --again will await results of the urinalysis and culture as well.  We certainly will have to keep an eye on her blood sugar as well  [id number]-of note more than 40 minutes spent assessing patient-reassessing patient-discussing her status with nursing staff as well as with her daughter in the facility-and coordinating and formulating a plan of care for numerous diagnoses-of note greater than 50% of time spent coordinating plan of care       .

## 2014-05-07 LAB — GLUCOSE, CAPILLARY
GLUCOSE-CAPILLARY: 250 mg/dL — AB (ref 70–99)
GLUCOSE-CAPILLARY: 297 mg/dL — AB (ref 70–99)
Glucose-Capillary: 243 mg/dL — ABNORMAL HIGH (ref 70–99)
Glucose-Capillary: 316 mg/dL — ABNORMAL HIGH (ref 70–99)
Glucose-Capillary: 344 mg/dL — ABNORMAL HIGH (ref 70–99)

## 2014-05-08 LAB — GLUCOSE, CAPILLARY
GLUCOSE-CAPILLARY: 389 mg/dL — AB (ref 70–99)
Glucose-Capillary: 251 mg/dL — ABNORMAL HIGH (ref 70–99)
Glucose-Capillary: 277 mg/dL — ABNORMAL HIGH (ref 70–99)
Glucose-Capillary: 370 mg/dL — ABNORMAL HIGH (ref 70–99)
Glucose-Capillary: 326 mg/dL — ABNORMAL HIGH (ref 70–99)
Glucose-Capillary: 356 mg/dL — ABNORMAL HIGH (ref 70–99)
Glucose-Capillary: 252 mg/dL — ABNORMAL HIGH (ref 70–99)

## 2014-05-09 LAB — GLUCOSE, CAPILLARY
GLUCOSE-CAPILLARY: 198 mg/dL — AB (ref 70–99)
Glucose-Capillary: 196 mg/dL — ABNORMAL HIGH (ref 70–99)
Glucose-Capillary: 313 mg/dL — ABNORMAL HIGH (ref 70–99)
Glucose-Capillary: 339 mg/dL — ABNORMAL HIGH (ref 70–99)

## 2014-05-10 LAB — GLUCOSE, CAPILLARY
GLUCOSE-CAPILLARY: 433 mg/dL — AB (ref 70–99)
GLUCOSE-CAPILLARY: 181 mg/dL — AB (ref 70–99)
Glucose-Capillary: 235 mg/dL — ABNORMAL HIGH (ref 70–99)
Glucose-Capillary: 193 mg/dL — ABNORMAL HIGH (ref 70–99)
Glucose-Capillary: 201 mg/dL — ABNORMAL HIGH (ref 70–99)
Glucose-Capillary: 231 mg/dL — ABNORMAL HIGH (ref 70–99)

## 2014-05-11 LAB — GLUCOSE, CAPILLARY
GLUCOSE-CAPILLARY: 170 mg/dL — AB (ref 70–99)
GLUCOSE-CAPILLARY: 292 mg/dL — AB (ref 70–99)
Glucose-Capillary: 162 mg/dL — ABNORMAL HIGH (ref 70–99)
Glucose-Capillary: 323 mg/dL — ABNORMAL HIGH (ref 70–99)
Glucose-Capillary: 259 mg/dL — ABNORMAL HIGH (ref 70–99)

## 2014-05-12 LAB — GLUCOSE, CAPILLARY
GLUCOSE-CAPILLARY: 258 mg/dL — AB (ref 70–99)
GLUCOSE-CAPILLARY: 200 mg/dL — AB (ref 70–99)
GLUCOSE-CAPILLARY: 293 mg/dL — AB (ref 70–99)
Glucose-Capillary: 257 mg/dL — ABNORMAL HIGH (ref 70–99)

## 2014-05-13 LAB — GLUCOSE, CAPILLARY
GLUCOSE-CAPILLARY: 242 mg/dL — AB (ref 70–99)
Glucose-Capillary: 160 mg/dL — ABNORMAL HIGH (ref 70–99)
Glucose-Capillary: 159 mg/dL — ABNORMAL HIGH (ref 70–99)
Glucose-Capillary: 329 mg/dL — ABNORMAL HIGH (ref 70–99)
Glucose-Capillary: 311 mg/dL — ABNORMAL HIGH (ref 70–99)

## 2014-05-14 LAB — GLUCOSE, CAPILLARY
GLUCOSE-CAPILLARY: 162 mg/dL — AB (ref 70–99)
GLUCOSE-CAPILLARY: 180 mg/dL — AB (ref 70–99)
GLUCOSE-CAPILLARY: 287 mg/dL — AB (ref 70–99)
Glucose-Capillary: 170 mg/dL — ABNORMAL HIGH (ref 70–99)
Glucose-Capillary: 356 mg/dL — ABNORMAL HIGH (ref 70–99)

## 2014-05-15 LAB — GLUCOSE, CAPILLARY
GLUCOSE-CAPILLARY: 232 mg/dL — AB (ref 70–99)
GLUCOSE-CAPILLARY: 182 mg/dL — AB (ref 70–99)
GLUCOSE-CAPILLARY: 323 mg/dL — AB (ref 70–99)
GLUCOSE-CAPILLARY: 309 mg/dL — AB (ref 70–99)
Glucose-Capillary: 314 mg/dL — ABNORMAL HIGH (ref 70–99)
Glucose-Capillary: 258 mg/dL — ABNORMAL HIGH (ref 70–99)

## 2014-05-16 LAB — GLUCOSE, CAPILLARY
GLUCOSE-CAPILLARY: 163 mg/dL — AB (ref 70–99)
Glucose-Capillary: 173 mg/dL — ABNORMAL HIGH (ref 70–99)
Glucose-Capillary: 286 mg/dL — ABNORMAL HIGH (ref 70–99)
Glucose-Capillary: 206 mg/dL — ABNORMAL HIGH (ref 70–99)
Glucose-Capillary: 237 mg/dL — ABNORMAL HIGH (ref 70–99)

## 2014-05-17 LAB — GLUCOSE, CAPILLARY
GLUCOSE-CAPILLARY: 161 mg/dL — AB (ref 70–99)
GLUCOSE-CAPILLARY: 216 mg/dL — AB (ref 70–99)
Glucose-Capillary: 178 mg/dL — ABNORMAL HIGH (ref 70–99)
Glucose-Capillary: 303 mg/dL — ABNORMAL HIGH (ref 70–99)

## 2014-05-18 LAB — GLUCOSE, CAPILLARY
GLUCOSE-CAPILLARY: 214 mg/dL — AB (ref 70–99)
Glucose-Capillary: 191 mg/dL — ABNORMAL HIGH (ref 70–99)
Glucose-Capillary: 172 mg/dL — ABNORMAL HIGH (ref 70–99)
Glucose-Capillary: 299 mg/dL — ABNORMAL HIGH (ref 70–99)
Glucose-Capillary: 252 mg/dL — ABNORMAL HIGH (ref 70–99)

## 2014-05-19 LAB — GLUCOSE, CAPILLARY
GLUCOSE-CAPILLARY: 177 mg/dL — AB (ref 70–99)
GLUCOSE-CAPILLARY: 164 mg/dL — AB (ref 70–99)
GLUCOSE-CAPILLARY: 283 mg/dL — AB (ref 70–99)
Glucose-Capillary: 191 mg/dL — ABNORMAL HIGH (ref 70–99)

## 2014-05-20 ENCOUNTER — Ambulatory Visit (HOSPITAL_COMMUNITY)
Admit: 2014-05-20 | Discharge: 2014-05-20 | Disposition: A | Discharge status: Home / Self Care | Payer: Medicare Other | Source: Ambulatory Visit | Attending: Internal Medicine | Admitting: Internal Medicine

## 2014-05-20 ENCOUNTER — Ambulatory Visit (HOSPITAL_COMMUNITY): Payer: Medicare Other | Attending: Internal Medicine

## 2014-05-20 ENCOUNTER — Non-Acute Institutional Stay (SKILLED_NURSING_FACILITY): Payer: Medicare Other | Admitting: Internal Medicine

## 2014-05-20 DIAGNOSIS — J441 Chronic obstructive pulmonary disease with (acute) exacerbation: Secondary | ICD-10-CM

## 2014-05-20 DIAGNOSIS — R609 Edema, unspecified: Secondary | ICD-10-CM

## 2014-05-20 DIAGNOSIS — I5032 Chronic diastolic (congestive) heart failure: Secondary | ICD-10-CM

## 2014-05-20 DIAGNOSIS — I509 Heart failure, unspecified: Secondary | ICD-10-CM | POA: Insufficient documentation

## 2014-05-20 LAB — GLUCOSE, CAPILLARY
Glucose-Capillary: 162 mg/dL — ABNORMAL HIGH (ref 70–99)
Glucose-Capillary: 260 mg/dL — ABNORMAL HIGH (ref 70–99)

## 2014-05-20 NOTE — Progress Notes (Signed)
Patient ID: [name], female   DOB: 18-Aug-1922, 80 y.o.   MRN: [id number]   This is an acute visit.  Level of care skilled.  Facility [facility] .  Chief complaint- acute visit secondary to question edema -- chest congestion  History of present illness.  Patient is a pleasant 80 year old female with a complicated medical history including recent hospitalization for respiratory issues out multi-factorial combination of COPD exasperation aspiration and some element of CHF.  She was treated with IV steroids antibiotics and dilators and has completed a prednisone taper.  Also thought to have volume overload diuresed with IV Lasix in continues on Demadex 80 mg in the morning and 40 mg every afternoon.  Nursing staff has been monitoring her weights there appears to be some variability --what appears to be relatively stable the past week-currently 2 22.6 which is stable with the weight September 10--nursing staff today thought her right leg looked like they had some additional edema-patient does not complaining of any pain here.  In regards to breathing she does not complaining of any increased shortness of breath or chest pain appears to be comfortable O2 saturations have been in the 90s.  Today's nurse however thought she appreciated some chest congestion.       Family medical social history as been reviewed from previous progress notes including 04/25/2014 and 04/21/2014. and 05/06/2014   Medications have been reviewed per MAR.   Review of systems-somewhat limited since patient is a poor historian.  In general no complaints of fevers or chills.  Respiratory does not complaining of shortness of breath or cough although she does have a chronic cough which apparently is relatively unchanged.  Cardiac does not complaining of chest pain has fairly significant lower extremity edema and venous stasis changes.  GI does not complaining of abdominal discomfort nausea or vomiting Il.  GU is not  complaining specifically of dysuria.  Muscle skeletal is not complaining of joint pain does have some weakness which is baseline.  Neurologic does not complaining of dizziness headache or syncopal-type feelings.  Psych some history of dementia .  Physical exam.  Temperature 97.3 pulse 60 respirations 18 blood pressure 148/82 weight is 222.6 .  In general this is a pleasant elderly female in no distress sitting comfortably on the side of her bed.  Her skin is warm and dry with diffuse sun-induced changes the skin on her face and back.  Eyes pupils reactive to light sclera and conjunctiva clear visual acuity appears grossly intact.  Oropharynx clear mucous membranes moist.  Chest she has reduced breath sounds but I cannot really appreciate wheezing or congestion she has no labored breathing.  Heart is regular rate and rhythm with occasional irregular beats-continues with 2+ lower extremity edema and venous stasis changes right leg does appear a bit more edematous than the left but it is not tender or have significant increased erythema.  Abdomen is obese soft nontender positive bowel sounds.  Muscle skeletal-- moves all extremities at baseline.  Neurologic is grossly intact her speech is clear grip strength appears to be equal I do not see any lateralizing findings.  Psych she is oriented to self pleasant appropriate although confused .  Labs 05/09/2014.  WBC 7.2-hemoglobin 9.8 platelets 164.  Sodium 137 potassium 4.3-BUN 56-creatinine 1.91 .  05/06/2014.  Sodium 135 potassium 4.3 BUN 48 creatinine 1.88.  Of note glucose a metabolic panel was [id number] CBG was 4.7.  05/02/2014.  Sodium 140 potassium 3.7 BUN 53 creatinine 1.65. On  the 20th 2015.  WBC 10.5-hemoglobin 11.0-platelets 192   Assessment and plan.  #1-right leg edema-this is appear slightly increased we'll order venous Doppler to rule out any DVT.  Her weight is appear to be relatively stable since Demadex was  increased at this point will monitor.  #2-question chest congestion-with her history will order a chest x-ray she does have a significant history of COPD and respiratory failure-although clinically she appears to be quite stable no sign of distress--she is on albuterol inhaler as well as nebulizers every 4 hours when necessary   .  #3 chronic renal insufficiency- this will have to be followed her creatinine and BUN appears to be relatively close to her recent baseline--   [id number]

## 2014-05-22 ENCOUNTER — Encounter: Payer: Self-pay | Admitting: Internal Medicine

## 2014-05-22 DIAGNOSIS — R609 Edema, unspecified: Secondary | ICD-10-CM | POA: Insufficient documentation

## 2014-05-23 LAB — GLUCOSE, CAPILLARY
GLUCOSE-CAPILLARY: 343 mg/dL — AB (ref 70–99)
GLUCOSE-CAPILLARY: 170 mg/dL — AB (ref 70–99)
GLUCOSE-CAPILLARY: 170 mg/dL — AB (ref 70–99)
GLUCOSE-CAPILLARY: 321 mg/dL — AB (ref 70–99)
Glucose-Capillary: 232 mg/dL — ABNORMAL HIGH (ref 70–99)
Glucose-Capillary: 236 mg/dL — ABNORMAL HIGH (ref 70–99)
Glucose-Capillary: 294 mg/dL — ABNORMAL HIGH (ref 70–99)
Glucose-Capillary: 212 mg/dL — ABNORMAL HIGH (ref 70–99)
Glucose-Capillary: 178 mg/dL — ABNORMAL HIGH (ref 70–99)
Glucose-Capillary: 348 mg/dL — ABNORMAL HIGH (ref 70–99)
Glucose-Capillary: 202 mg/dL — ABNORMAL HIGH (ref 70–99)
Glucose-Capillary: 161 mg/dL — ABNORMAL HIGH (ref 70–99)
Glucose-Capillary: 188 mg/dL — ABNORMAL HIGH (ref 70–99)
Glucose-Capillary: 172 mg/dL — ABNORMAL HIGH (ref 70–99)
Glucose-Capillary: 219 mg/dL — ABNORMAL HIGH (ref 70–99)

## 2014-05-24 LAB — GLUCOSE, CAPILLARY
GLUCOSE-CAPILLARY: 164 mg/dL — AB (ref 70–99)
Glucose-Capillary: 179 mg/dL — ABNORMAL HIGH (ref 70–99)
Glucose-Capillary: 302 mg/dL — ABNORMAL HIGH (ref 70–99)
Glucose-Capillary: 223 mg/dL — ABNORMAL HIGH (ref 70–99)

## 2014-05-25 LAB — GLUCOSE, CAPILLARY
GLUCOSE-CAPILLARY: 185 mg/dL — AB (ref 70–99)
GLUCOSE-CAPILLARY: 146 mg/dL — AB (ref 70–99)
Glucose-Capillary: 174 mg/dL — ABNORMAL HIGH (ref 70–99)
Glucose-Capillary: 341 mg/dL — ABNORMAL HIGH (ref 70–99)
Glucose-Capillary: 241 mg/dL — ABNORMAL HIGH (ref 70–99)

## 2014-05-26 LAB — GLUCOSE, CAPILLARY
GLUCOSE-CAPILLARY: 149 mg/dL — AB (ref 70–99)
Glucose-Capillary: 152 mg/dL — ABNORMAL HIGH (ref 70–99)
Glucose-Capillary: 294 mg/dL — ABNORMAL HIGH (ref 70–99)
Glucose-Capillary: 177 mg/dL — ABNORMAL HIGH (ref 70–99)

## 2014-05-27 LAB — GLUCOSE, CAPILLARY
GLUCOSE-CAPILLARY: 167 mg/dL — AB (ref 70–99)
Glucose-Capillary: 169 mg/dL — ABNORMAL HIGH (ref 70–99)
Glucose-Capillary: 395 mg/dL — ABNORMAL HIGH (ref 70–99)
Glucose-Capillary: 192 mg/dL — ABNORMAL HIGH (ref 70–99)
Glucose-Capillary: 204 mg/dL — ABNORMAL HIGH (ref 70–99)

## 2014-05-28 LAB — GLUCOSE, CAPILLARY
GLUCOSE-CAPILLARY: 169 mg/dL — AB (ref 70–99)
Glucose-Capillary: 157 mg/dL — ABNORMAL HIGH (ref 70–99)
Glucose-Capillary: 331 mg/dL — ABNORMAL HIGH (ref 70–99)
Glucose-Capillary: 176 mg/dL — ABNORMAL HIGH (ref 70–99)

## 2014-05-29 LAB — GLUCOSE, CAPILLARY
GLUCOSE-CAPILLARY: 230 mg/dL — AB (ref 70–99)
GLUCOSE-CAPILLARY: 170 mg/dL — AB (ref 70–99)
Glucose-Capillary: 156 mg/dL — ABNORMAL HIGH (ref 70–99)
Glucose-Capillary: 154 mg/dL — ABNORMAL HIGH (ref 70–99)
Glucose-Capillary: 198 mg/dL — ABNORMAL HIGH (ref 70–99)

## 2014-05-30 LAB — GLUCOSE, CAPILLARY
Glucose-Capillary: 158 mg/dL — ABNORMAL HIGH (ref 70–99)
Glucose-Capillary: 182 mg/dL — ABNORMAL HIGH (ref 70–99)
Glucose-Capillary: 195 mg/dL — ABNORMAL HIGH (ref 70–99)
Glucose-Capillary: 145 mg/dL — ABNORMAL HIGH (ref 70–99)
Glucose-Capillary: 217 mg/dL — ABNORMAL HIGH (ref 70–99)

## 2014-05-31 LAB — GLUCOSE, CAPILLARY
GLUCOSE-CAPILLARY: 165 mg/dL — AB (ref 70–99)
GLUCOSE-CAPILLARY: 192 mg/dL — AB (ref 70–99)
GLUCOSE-CAPILLARY: 238 mg/dL — AB (ref 70–99)
Glucose-Capillary: 154 mg/dL — ABNORMAL HIGH (ref 70–99)
Glucose-Capillary: 248 mg/dL — ABNORMAL HIGH (ref 70–99)

## 2014-06-01 LAB — GLUCOSE, CAPILLARY
GLUCOSE-CAPILLARY: 269 mg/dL — AB (ref 70–99)
Glucose-Capillary: 151 mg/dL — ABNORMAL HIGH (ref 70–99)
Glucose-Capillary: 147 mg/dL — ABNORMAL HIGH (ref 70–99)
Glucose-Capillary: 207 mg/dL — ABNORMAL HIGH (ref 70–99)
Glucose-Capillary: 237 mg/dL — ABNORMAL HIGH (ref 70–99)

## 2014-06-02 LAB — GLUCOSE, CAPILLARY
GLUCOSE-CAPILLARY: 133 mg/dL — AB (ref 70–99)
GLUCOSE-CAPILLARY: 313 mg/dL — AB (ref 70–99)
GLUCOSE-CAPILLARY: 167 mg/dL — AB (ref 70–99)
Glucose-Capillary: 148 mg/dL — ABNORMAL HIGH (ref 70–99)
Glucose-Capillary: 194 mg/dL — ABNORMAL HIGH (ref 70–99)

## 2014-06-03 LAB — GLUCOSE, CAPILLARY
GLUCOSE-CAPILLARY: 182 mg/dL — AB (ref 70–99)
GLUCOSE-CAPILLARY: 157 mg/dL — AB (ref 70–99)
Glucose-Capillary: 230 mg/dL — ABNORMAL HIGH (ref 70–99)
Glucose-Capillary: 152 mg/dL — ABNORMAL HIGH (ref 70–99)

## 2014-06-04 LAB — GLUCOSE, CAPILLARY
GLUCOSE-CAPILLARY: 162 mg/dL — AB (ref 70–99)
GLUCOSE-CAPILLARY: 174 mg/dL — AB (ref 70–99)
Glucose-Capillary: 178 mg/dL — ABNORMAL HIGH (ref 70–99)
Glucose-Capillary: 256 mg/dL — ABNORMAL HIGH (ref 70–99)
Glucose-Capillary: 196 mg/dL — ABNORMAL HIGH (ref 70–99)

## 2014-06-05 LAB — GLUCOSE, CAPILLARY
GLUCOSE-CAPILLARY: 151 mg/dL — AB (ref 70–99)
GLUCOSE-CAPILLARY: 267 mg/dL — AB (ref 70–99)
Glucose-Capillary: 169 mg/dL — ABNORMAL HIGH (ref 70–99)
Glucose-Capillary: 154 mg/dL — ABNORMAL HIGH (ref 70–99)
Glucose-Capillary: 171 mg/dL — ABNORMAL HIGH (ref 70–99)

## 2014-06-06 LAB — GLUCOSE, CAPILLARY
GLUCOSE-CAPILLARY: 235 mg/dL — AB (ref 70–99)
Glucose-Capillary: 188 mg/dL — ABNORMAL HIGH (ref 70–99)
Glucose-Capillary: 137 mg/dL — ABNORMAL HIGH (ref 70–99)
Glucose-Capillary: 147 mg/dL — ABNORMAL HIGH (ref 70–99)
Glucose-Capillary: 199 mg/dL — ABNORMAL HIGH (ref 70–99)

## 2014-06-07 LAB — GLUCOSE, CAPILLARY
GLUCOSE-CAPILLARY: 142 mg/dL — AB (ref 70–99)
Glucose-Capillary: 160 mg/dL — ABNORMAL HIGH (ref 70–99)
Glucose-Capillary: 245 mg/dL — ABNORMAL HIGH (ref 70–99)
Glucose-Capillary: 205 mg/dL — ABNORMAL HIGH (ref 70–99)

## 2014-06-08 LAB — GLUCOSE, CAPILLARY
GLUCOSE-CAPILLARY: 194 mg/dL — AB (ref 70–99)
Glucose-Capillary: 142 mg/dL — ABNORMAL HIGH (ref 70–99)
Glucose-Capillary: 157 mg/dL — ABNORMAL HIGH (ref 70–99)
Glucose-Capillary: 144 mg/dL — ABNORMAL HIGH (ref 70–99)
Glucose-Capillary: 188 mg/dL — ABNORMAL HIGH (ref 70–99)

## 2014-06-09 LAB — GLUCOSE, CAPILLARY
GLUCOSE-CAPILLARY: 169 mg/dL — AB (ref 70–99)
Glucose-Capillary: 151 mg/dL — ABNORMAL HIGH (ref 70–99)
Glucose-Capillary: 151 mg/dL — ABNORMAL HIGH (ref 70–99)
Glucose-Capillary: 174 mg/dL — ABNORMAL HIGH (ref 70–99)
Glucose-Capillary: 166 mg/dL — ABNORMAL HIGH (ref 70–99)

## 2014-06-10 LAB — GLUCOSE, CAPILLARY
GLUCOSE-CAPILLARY: 162 mg/dL — AB (ref 70–99)
GLUCOSE-CAPILLARY: 161 mg/dL — AB (ref 70–99)
Glucose-Capillary: 170 mg/dL — ABNORMAL HIGH (ref 70–99)
Glucose-Capillary: 200 mg/dL — ABNORMAL HIGH (ref 70–99)
Glucose-Capillary: 187 mg/dL — ABNORMAL HIGH (ref 70–99)

## 2014-06-11 ENCOUNTER — Inpatient Hospital Stay (HOSPITAL_COMMUNITY): Payer: Medicare Other | Attending: Internal Medicine

## 2014-06-11 LAB — GLUCOSE, CAPILLARY
GLUCOSE-CAPILLARY: 175 mg/dL — AB (ref 70–99)
Glucose-Capillary: 150 mg/dL — ABNORMAL HIGH (ref 70–99)
Glucose-Capillary: 174 mg/dL — ABNORMAL HIGH (ref 70–99)
Glucose-Capillary: 219 mg/dL — ABNORMAL HIGH (ref 70–99)
Glucose-Capillary: 185 mg/dL — ABNORMAL HIGH (ref 70–99)

## 2014-06-12 LAB — GLUCOSE, CAPILLARY
GLUCOSE-CAPILLARY: 160 mg/dL — AB (ref 70–99)
GLUCOSE-CAPILLARY: 160 mg/dL — AB (ref 70–99)
GLUCOSE-CAPILLARY: 191 mg/dL — AB (ref 70–99)
Glucose-Capillary: 152 mg/dL — ABNORMAL HIGH (ref 70–99)
Glucose-Capillary: 220 mg/dL — ABNORMAL HIGH (ref 70–99)

## 2014-06-13 LAB — GLUCOSE, CAPILLARY
GLUCOSE-CAPILLARY: 157 mg/dL — AB (ref 70–99)
GLUCOSE-CAPILLARY: 174 mg/dL — AB (ref 70–99)
Glucose-Capillary: 142 mg/dL — ABNORMAL HIGH (ref 70–99)
Glucose-Capillary: 154 mg/dL — ABNORMAL HIGH (ref 70–99)
Glucose-Capillary: 156 mg/dL — ABNORMAL HIGH (ref 70–99)

## 2014-06-14 LAB — GLUCOSE, CAPILLARY
GLUCOSE-CAPILLARY: 210 mg/dL — AB (ref 70–99)
Glucose-Capillary: 137 mg/dL — ABNORMAL HIGH (ref 70–99)
Glucose-Capillary: 161 mg/dL — ABNORMAL HIGH (ref 70–99)
Glucose-Capillary: 134 mg/dL — ABNORMAL HIGH (ref 70–99)

## 2014-06-15 ENCOUNTER — Non-Acute Institutional Stay (SKILLED_NURSING_FACILITY): Payer: Medicare Other | Admitting: Internal Medicine

## 2014-06-15 DIAGNOSIS — J441 Chronic obstructive pulmonary disease with (acute) exacerbation: Secondary | ICD-10-CM

## 2014-06-15 DIAGNOSIS — I5032 Chronic diastolic (congestive) heart failure: Secondary | ICD-10-CM

## 2014-06-15 DIAGNOSIS — R059 Cough, unspecified: Secondary | ICD-10-CM

## 2014-06-15 DIAGNOSIS — N184 Chronic kidney disease, stage 4 (severe): Secondary | ICD-10-CM

## 2014-06-15 DIAGNOSIS — R05 Cough: Secondary | ICD-10-CM

## 2014-06-15 LAB — GLUCOSE, CAPILLARY
GLUCOSE-CAPILLARY: 213 mg/dL — AB (ref 70–99)
GLUCOSE-CAPILLARY: 150 mg/dL — AB (ref 70–99)
Glucose-Capillary: 139 mg/dL — ABNORMAL HIGH (ref 70–99)
Glucose-Capillary: 146 mg/dL — ABNORMAL HIGH (ref 70–99)
Glucose-Capillary: 159 mg/dL — ABNORMAL HIGH (ref 70–99)
Glucose-Capillary: 240 mg/dL — ABNORMAL HIGH (ref 70–99)

## 2014-06-15 NOTE — Progress Notes (Signed)
Patient ID: [name], female   DOB: 02/28/1922, 81 y.o.   MRN: [id number] Facility; [facility] Chief complaint; fever, renal insufficiency History; this patient was a patient that we had admitted to the facility in the summer. She became a long-term resident. She has COPD on a 2-3 L of oxygen she also is on BiPAP. She also has diastolically mediated congestive heart failure and stage IV chronic renal failure. Her baseline creatinine appears to be in the 1.8-2 range.  Apparently over the last several days she's had a low grade fever. I don't actually see this quantified. She had a chest x-ray done on 10/10. This was clear [see below]. Lab work shows a urinalysis with many bacteria large blood. Hemoglobin is 10.7 white count 10.9. Her sodium is 136 potassium 4.3 BUN 54 creatinine at 2.21.   The patient only complains of cough. She is not complaining of shortness of breath chest pain or sputum production. She denies dysuria.  Review of systems Weight this has been very stable for some period of time now in the 220-225 range Respiratory; no cough but no wheezing no excessive shortness of breath no chest pain Cardiac no exertional chest symptoms GI no abnormal pain and the diarrhea GU no frequency or dysuria  Physical examination Gen. the patient is awake upper-inner chair eating breakfast. She does not appear to be in any distress. A dry cough as noted Vitals; temperature is 97.2 pulse 70 respirations 18 blood pressure 112/70 O2 sat is 95% noted that she is not on oxygen at least while the time I was in the room and when I came into the room Respiratory; shallow air entry bilaterally but no crackles or wheezes no wheezing her work of breathing is normal Cardiac 2/6 pansystolic murmur at the lower left sternal border JVP is not elevated she has some lower extremity edema although this is not out of the keeping with what I am used to seeing Abdomen; there is no masses no tenderness GU no  suprapubic or costovertebral angle tenderness  Impression/plan #1 fever; she is not febrile this morning. She has a dry cough which is always concerning for somebody with COPD however her pulmonary status seems very stable and I don't think empiric antibiotics are warned her that this stage. In fact there is no evidence of respiratory distress or bronchospasm #2 urinalysis showing not just many bacteria but large blood. In going to wait for the culture here, she will probably need antibiotics if the cultures positive not just for her the recent fever but the hematuria. As noted she seems to be asymptomatic #3 hematuria; see discussion above. I don't see a past urinalysis which seems amazing to me. This will need to be monitored in the next few weeks #4 diastolic heart failure no evidence of this at the bedside her chest x-ray was clear #5 tonic renal failure. Her current BUN and creatinine at 54/2.21 are worse than previous values although mildly so. She is on Demadex 80 mg daily. Her weights appear to be stable. There seems no particular reason to change her torsemide dose at this point. #6 normochromic normocytic anemia. This is probably related to chronic renal insufficiency. This will need to be followed as well

## 2014-06-16 LAB — GLUCOSE, CAPILLARY
GLUCOSE-CAPILLARY: 151 mg/dL — AB (ref 70–99)
GLUCOSE-CAPILLARY: 166 mg/dL — AB (ref 70–99)
Glucose-Capillary: 144 mg/dL — ABNORMAL HIGH (ref 70–99)
Glucose-Capillary: 189 mg/dL — ABNORMAL HIGH (ref 70–99)

## 2014-06-17 LAB — GLUCOSE, CAPILLARY
GLUCOSE-CAPILLARY: 210 mg/dL — AB (ref 70–99)
Glucose-Capillary: 143 mg/dL — ABNORMAL HIGH (ref 70–99)
Glucose-Capillary: 152 mg/dL — ABNORMAL HIGH (ref 70–99)
Glucose-Capillary: 155 mg/dL — ABNORMAL HIGH (ref 70–99)
Glucose-Capillary: 164 mg/dL — ABNORMAL HIGH (ref 70–99)

## 2014-06-18 LAB — GLUCOSE, CAPILLARY
GLUCOSE-CAPILLARY: 135 mg/dL — AB (ref 70–99)
Glucose-Capillary: 238 mg/dL — ABNORMAL HIGH (ref 70–99)
Glucose-Capillary: 207 mg/dL — ABNORMAL HIGH (ref 70–99)
Glucose-Capillary: 178 mg/dL — ABNORMAL HIGH (ref 70–99)

## 2014-06-19 LAB — GLUCOSE, CAPILLARY
GLUCOSE-CAPILLARY: 141 mg/dL — AB (ref 70–99)
GLUCOSE-CAPILLARY: 148 mg/dL — AB (ref 70–99)
Glucose-Capillary: 137 mg/dL — ABNORMAL HIGH (ref 70–99)
Glucose-Capillary: 279 mg/dL — ABNORMAL HIGH (ref 70–99)
Glucose-Capillary: 145 mg/dL — ABNORMAL HIGH (ref 70–99)

## 2014-06-20 LAB — GLUCOSE, CAPILLARY
GLUCOSE-CAPILLARY: 186 mg/dL — AB (ref 70–99)
Glucose-Capillary: 143 mg/dL — ABNORMAL HIGH (ref 70–99)
Glucose-Capillary: 144 mg/dL — ABNORMAL HIGH (ref 70–99)
Glucose-Capillary: 156 mg/dL — ABNORMAL HIGH (ref 70–99)
Glucose-Capillary: 143 mg/dL — ABNORMAL HIGH (ref 70–99)

## 2014-06-21 LAB — GLUCOSE, CAPILLARY
Glucose-Capillary: 131 mg/dL — ABNORMAL HIGH (ref 70–99)
Glucose-Capillary: 128 mg/dL — ABNORMAL HIGH (ref 70–99)
Glucose-Capillary: 173 mg/dL — ABNORMAL HIGH (ref 70–99)

## 2014-06-22 LAB — GLUCOSE, CAPILLARY
GLUCOSE-CAPILLARY: 184 mg/dL — AB (ref 70–99)
GLUCOSE-CAPILLARY: 136 mg/dL — AB (ref 70–99)
Glucose-Capillary: 130 mg/dL — ABNORMAL HIGH (ref 70–99)
Glucose-Capillary: 155 mg/dL — ABNORMAL HIGH (ref 70–99)
Glucose-Capillary: 167 mg/dL — ABNORMAL HIGH (ref 70–99)

## 2014-06-23 LAB — GLUCOSE, CAPILLARY
Glucose-Capillary: 166 mg/dL — ABNORMAL HIGH (ref 70–99)
Glucose-Capillary: 191 mg/dL — ABNORMAL HIGH (ref 70–99)
Glucose-Capillary: 168 mg/dL — ABNORMAL HIGH (ref 70–99)

## 2014-06-24 LAB — GLUCOSE, CAPILLARY
GLUCOSE-CAPILLARY: 172 mg/dL — AB (ref 70–99)
Glucose-Capillary: 145 mg/dL — ABNORMAL HIGH (ref 70–99)
Glucose-Capillary: 133 mg/dL — ABNORMAL HIGH (ref 70–99)
Glucose-Capillary: 169 mg/dL — ABNORMAL HIGH (ref 70–99)
Glucose-Capillary: 195 mg/dL — ABNORMAL HIGH (ref 70–99)

## 2014-06-25 LAB — GLUCOSE, CAPILLARY
GLUCOSE-CAPILLARY: 206 mg/dL — AB (ref 70–99)
Glucose-Capillary: 117 mg/dL — ABNORMAL HIGH (ref 70–99)
Glucose-Capillary: 125 mg/dL — ABNORMAL HIGH (ref 70–99)
Glucose-Capillary: 182 mg/dL — ABNORMAL HIGH (ref 70–99)

## 2014-06-26 LAB — GLUCOSE, CAPILLARY
GLUCOSE-CAPILLARY: 127 mg/dL — AB (ref 70–99)
GLUCOSE-CAPILLARY: 177 mg/dL — AB (ref 70–99)
Glucose-Capillary: 137 mg/dL — ABNORMAL HIGH (ref 70–99)
Glucose-Capillary: 213 mg/dL — ABNORMAL HIGH (ref 70–99)

## 2014-06-27 LAB — GLUCOSE, CAPILLARY
GLUCOSE-CAPILLARY: 118 mg/dL — AB (ref 70–99)
Glucose-Capillary: 119 mg/dL — ABNORMAL HIGH (ref 70–99)
Glucose-Capillary: 172 mg/dL — ABNORMAL HIGH (ref 70–99)
Glucose-Capillary: 132 mg/dL — ABNORMAL HIGH (ref 70–99)
Glucose-Capillary: 159 mg/dL — ABNORMAL HIGH (ref 70–99)

## 2014-06-28 LAB — GLUCOSE, CAPILLARY
GLUCOSE-CAPILLARY: 209 mg/dL — AB (ref 70–99)
GLUCOSE-CAPILLARY: 143 mg/dL — AB (ref 70–99)
Glucose-Capillary: 139 mg/dL — ABNORMAL HIGH (ref 70–99)
Glucose-Capillary: 142 mg/dL — ABNORMAL HIGH (ref 70–99)

## 2014-06-29 LAB — GLUCOSE, CAPILLARY
GLUCOSE-CAPILLARY: 177 mg/dL — AB (ref 70–99)
GLUCOSE-CAPILLARY: 175 mg/dL — AB (ref 70–99)
GLUCOSE-CAPILLARY: 205 mg/dL — AB (ref 70–99)
GLUCOSE-CAPILLARY: 138 mg/dL — AB (ref 70–99)
GLUCOSE-CAPILLARY: 186 mg/dL — AB (ref 70–99)
Glucose-Capillary: 154 mg/dL — ABNORMAL HIGH (ref 70–99)

## 2014-06-30 LAB — GLUCOSE, CAPILLARY
GLUCOSE-CAPILLARY: 146 mg/dL — AB (ref 70–99)
Glucose-Capillary: 139 mg/dL — ABNORMAL HIGH (ref 70–99)
Glucose-Capillary: 180 mg/dL — ABNORMAL HIGH (ref 70–99)

## 2014-07-01 LAB — GLUCOSE, CAPILLARY
Glucose-Capillary: 128 mg/dL — ABNORMAL HIGH (ref 70–99)
Glucose-Capillary: 149 mg/dL — ABNORMAL HIGH (ref 70–99)
Glucose-Capillary: 183 mg/dL — ABNORMAL HIGH (ref 70–99)
Glucose-Capillary: 210 mg/dL — ABNORMAL HIGH (ref 70–99)

## 2014-07-02 LAB — GLUCOSE, CAPILLARY
GLUCOSE-CAPILLARY: 166 mg/dL — AB (ref 70–99)
GLUCOSE-CAPILLARY: 146 mg/dL — AB (ref 70–99)
GLUCOSE-CAPILLARY: 264 mg/dL — AB (ref 70–99)
GLUCOSE-CAPILLARY: 159 mg/dL — AB (ref 70–99)
Glucose-Capillary: 172 mg/dL — ABNORMAL HIGH (ref 70–99)

## 2014-07-03 LAB — GLUCOSE, CAPILLARY
GLUCOSE-CAPILLARY: 129 mg/dL — AB (ref 70–99)
Glucose-Capillary: 145 mg/dL — ABNORMAL HIGH (ref 70–99)
Glucose-Capillary: 134 mg/dL — ABNORMAL HIGH (ref 70–99)
Glucose-Capillary: 182 mg/dL — ABNORMAL HIGH (ref 70–99)
Glucose-Capillary: 172 mg/dL — ABNORMAL HIGH (ref 70–99)

## 2014-07-04 LAB — GLUCOSE, CAPILLARY
GLUCOSE-CAPILLARY: 137 mg/dL — AB (ref 70–99)
GLUCOSE-CAPILLARY: 208 mg/dL — AB (ref 70–99)
Glucose-Capillary: 137 mg/dL — ABNORMAL HIGH (ref 70–99)
Glucose-Capillary: 164 mg/dL — ABNORMAL HIGH (ref 70–99)
Glucose-Capillary: 156 mg/dL — ABNORMAL HIGH (ref 70–99)

## 2014-07-05 LAB — GLUCOSE, CAPILLARY
GLUCOSE-CAPILLARY: 134 mg/dL — AB (ref 70–99)
GLUCOSE-CAPILLARY: 146 mg/dL — AB (ref 70–99)
GLUCOSE-CAPILLARY: 168 mg/dL — AB (ref 70–99)
Glucose-Capillary: 150 mg/dL — ABNORMAL HIGH (ref 70–99)

## 2014-07-06 LAB — GLUCOSE, CAPILLARY
GLUCOSE-CAPILLARY: 147 mg/dL — AB (ref 70–99)
Glucose-Capillary: 128 mg/dL — ABNORMAL HIGH (ref 70–99)
Glucose-Capillary: 134 mg/dL — ABNORMAL HIGH (ref 70–99)
Glucose-Capillary: 162 mg/dL — ABNORMAL HIGH (ref 70–99)

## 2014-07-07 LAB — GLUCOSE, CAPILLARY
GLUCOSE-CAPILLARY: 161 mg/dL — AB (ref 70–99)
Glucose-Capillary: 132 mg/dL — ABNORMAL HIGH (ref 70–99)
Glucose-Capillary: 163 mg/dL — ABNORMAL HIGH (ref 70–99)
Glucose-Capillary: 163 mg/dL — ABNORMAL HIGH (ref 70–99)

## 2014-07-08 LAB — GLUCOSE, CAPILLARY
Glucose-Capillary: 132 mg/dL — ABNORMAL HIGH (ref 70–99)
Glucose-Capillary: 133 mg/dL — ABNORMAL HIGH (ref 70–99)
Glucose-Capillary: 220 mg/dL — ABNORMAL HIGH (ref 70–99)
Glucose-Capillary: 110 mg/dL — ABNORMAL HIGH (ref 70–99)
Glucose-Capillary: 179 mg/dL — ABNORMAL HIGH (ref 70–99)

## 2014-07-09 LAB — GLUCOSE, CAPILLARY
GLUCOSE-CAPILLARY: 122 mg/dL — AB (ref 70–99)
GLUCOSE-CAPILLARY: 198 mg/dL — AB (ref 70–99)
Glucose-Capillary: 142 mg/dL — ABNORMAL HIGH (ref 70–99)
Glucose-Capillary: 204 mg/dL — ABNORMAL HIGH (ref 70–99)

## 2014-07-10 LAB — GLUCOSE, CAPILLARY
GLUCOSE-CAPILLARY: 148 mg/dL — AB (ref 70–99)
Glucose-Capillary: 133 mg/dL — ABNORMAL HIGH (ref 70–99)
Glucose-Capillary: 197 mg/dL — ABNORMAL HIGH (ref 70–99)
Glucose-Capillary: 151 mg/dL — ABNORMAL HIGH (ref 70–99)
Glucose-Capillary: 195 mg/dL — ABNORMAL HIGH (ref 70–99)

## 2014-07-11 LAB — GLUCOSE, CAPILLARY
GLUCOSE-CAPILLARY: 120 mg/dL — AB (ref 70–99)
Glucose-Capillary: 126 mg/dL — ABNORMAL HIGH (ref 70–99)
Glucose-Capillary: 191 mg/dL — ABNORMAL HIGH (ref 70–99)
Glucose-Capillary: 150 mg/dL — ABNORMAL HIGH (ref 70–99)
Glucose-Capillary: 192 mg/dL — ABNORMAL HIGH (ref 70–99)

## 2014-07-12 LAB — GLUCOSE, CAPILLARY
GLUCOSE-CAPILLARY: 149 mg/dL — AB (ref 70–99)
GLUCOSE-CAPILLARY: 161 mg/dL — AB (ref 70–99)
Glucose-Capillary: 114 mg/dL — ABNORMAL HIGH (ref 70–99)
Glucose-Capillary: 139 mg/dL — ABNORMAL HIGH (ref 70–99)
Glucose-Capillary: 206 mg/dL — ABNORMAL HIGH (ref 70–99)

## 2014-07-13 LAB — GLUCOSE, CAPILLARY
GLUCOSE-CAPILLARY: 135 mg/dL — AB (ref 70–99)
Glucose-Capillary: 242 mg/dL — ABNORMAL HIGH (ref 70–99)
Glucose-Capillary: 175 mg/dL — ABNORMAL HIGH (ref 70–99)
Glucose-Capillary: 235 mg/dL — ABNORMAL HIGH (ref 70–99)

## 2014-07-14 LAB — GLUCOSE, CAPILLARY
Glucose-Capillary: 154 mg/dL — ABNORMAL HIGH (ref 70–99)
Glucose-Capillary: 125 mg/dL — ABNORMAL HIGH (ref 70–99)
Glucose-Capillary: 136 mg/dL — ABNORMAL HIGH (ref 70–99)
Glucose-Capillary: 249 mg/dL — ABNORMAL HIGH (ref 70–99)
Glucose-Capillary: 251 mg/dL — ABNORMAL HIGH (ref 70–99)

## 2014-07-15 LAB — GLUCOSE, CAPILLARY
GLUCOSE-CAPILLARY: 163 mg/dL — AB (ref 70–99)
GLUCOSE-CAPILLARY: 167 mg/dL — AB (ref 70–99)
Glucose-Capillary: 167 mg/dL — ABNORMAL HIGH (ref 70–99)
Glucose-Capillary: 157 mg/dL — ABNORMAL HIGH (ref 70–99)
Glucose-Capillary: 211 mg/dL — ABNORMAL HIGH (ref 70–99)

## 2014-07-16 LAB — GLUCOSE, CAPILLARY
GLUCOSE-CAPILLARY: 180 mg/dL — AB (ref 70–99)
Glucose-Capillary: 143 mg/dL — ABNORMAL HIGH (ref 70–99)
Glucose-Capillary: 183 mg/dL — ABNORMAL HIGH (ref 70–99)

## 2014-07-17 LAB — GLUCOSE, CAPILLARY
Glucose-Capillary: 135 mg/dL — ABNORMAL HIGH (ref 70–99)
Glucose-Capillary: 159 mg/dL — ABNORMAL HIGH (ref 70–99)
Glucose-Capillary: 163 mg/dL — ABNORMAL HIGH (ref 70–99)
Glucose-Capillary: 158 mg/dL — ABNORMAL HIGH (ref 70–99)
Glucose-Capillary: 162 mg/dL — ABNORMAL HIGH (ref 70–99)

## 2014-07-18 LAB — GLUCOSE, CAPILLARY
GLUCOSE-CAPILLARY: 127 mg/dL — AB (ref 70–99)
GLUCOSE-CAPILLARY: 133 mg/dL — AB (ref 70–99)
GLUCOSE-CAPILLARY: 241 mg/dL — AB (ref 70–99)
GLUCOSE-CAPILLARY: 167 mg/dL — AB (ref 70–99)
Glucose-Capillary: 145 mg/dL — ABNORMAL HIGH (ref 70–99)

## 2014-07-19 LAB — GLUCOSE, CAPILLARY
GLUCOSE-CAPILLARY: 141 mg/dL — AB (ref 70–99)
Glucose-Capillary: 143 mg/dL — ABNORMAL HIGH (ref 70–99)
Glucose-Capillary: 178 mg/dL — ABNORMAL HIGH (ref 70–99)
Glucose-Capillary: 158 mg/dL — ABNORMAL HIGH (ref 70–99)

## 2014-07-20 ENCOUNTER — Ambulatory Visit (HOSPITAL_COMMUNITY): Payer: Medicare Other | Attending: Internal Medicine

## 2014-07-20 ENCOUNTER — Encounter: Payer: Self-pay | Admitting: Internal Medicine

## 2014-07-20 ENCOUNTER — Non-Acute Institutional Stay (SKILLED_NURSING_FACILITY): Payer: Medicare Other | Admitting: Internal Medicine

## 2014-07-20 DIAGNOSIS — I5032 Chronic diastolic (congestive) heart failure: Secondary | ICD-10-CM

## 2014-07-20 DIAGNOSIS — E1121 Type 2 diabetes mellitus with diabetic nephropathy: Secondary | ICD-10-CM

## 2014-07-20 DIAGNOSIS — N184 Chronic kidney disease, stage 4 (severe): Secondary | ICD-10-CM

## 2014-07-20 DIAGNOSIS — R05 Cough: Secondary | ICD-10-CM | POA: Diagnosis present

## 2014-07-20 DIAGNOSIS — I517 Cardiomegaly: Secondary | ICD-10-CM | POA: Insufficient documentation

## 2014-07-20 DIAGNOSIS — I10 Essential (primary) hypertension: Secondary | ICD-10-CM

## 2014-07-20 DIAGNOSIS — J441 Chronic obstructive pulmonary disease with (acute) exacerbation: Secondary | ICD-10-CM

## 2014-07-20 LAB — GLUCOSE, CAPILLARY
GLUCOSE-CAPILLARY: 165 mg/dL — AB (ref 70–99)
Glucose-Capillary: 125 mg/dL — ABNORMAL HIGH (ref 70–99)
Glucose-Capillary: 167 mg/dL — ABNORMAL HIGH (ref 70–99)

## 2014-07-20 NOTE — Progress Notes (Signed)
Patient ID: [name], female   DOB: [birth year], 80 y.o.   MRN: [id number]   This is an acute-routine visit.  Level care skilled.  Facility [facility].  Chief complaint medical management of chronic medical conditions including COPD diastolic CHF renal insufficiency diabetes type 2 GERD depression.  History of present illness.  Patient is a very pleasant 80 year old female with the above diagnoses.  She has been relatively stable most acute issues usually room all around her respiratory status with a history of COPD and diastolic CHF.  She is on torsemide 80 mg in the morning 40 mg at night-nursing has noted somewhat of a weight gain it appears about 5 pounds in the past 5 days-she does appear possibly to have some slightly increased edema here lower extremities.  She does not really complain of any increased shortness of breath O2 saturations continued B the 90s on chronic oxygen.  Regards COPD she is on nebs every 4 hours when necessary as well as Mucinex routinely and Proventil inhaler.  She also has a history of diabetes type 2 she is on Lantus 5 units and per nursing staff her blood sugars appear to be stable largely in the lower  mid 100s.--Occasionally some spikes over 200 later in the day but this does not appear to be consistent    In addition to the Lantus she also is on a sliding scale.  She does have a history of hypertension she is on Norvasc 5 mg a day as well as Lopressor 50 mg twice a day--pressures appear to be fairly stable recently 130/65-125/72 occasion she will have a systolics in the 401U but this is not the norm-  Family medical social history has been reviewed per previous progress notes including 06/15/2014 and readmission note on 04/21/2014.  Medications have been reviewed per MAR.  Review of systems.  In general does not complaining fever or chills appears she's had some mild weight gain here last week.  Skin does not complain of any rashes  or itching.  Head ears eyes nose mouth and throat-does not complain of any sore throat or visual changes.  Respiratory has an occasional cough is not complaining of shortness of breath again does have a significant history of COPD.  Cardiac does not complain of chest pain she does have fairly significant venous stasis lower extremity edema.  GI does not complain any of abdominal discomfort nausea vomiting diarrhea or constipation.  GU does not complain of dysuria.  Musculoskeletal-is not complaining of any joint pain she ambulates largely in a wheelchair.  Neurologic is not complaining of any dizziness syncopal-type episodes or headache.  Physical exam.  . Temperature 98.6 pulse 69 respirations 20 blood pressure 159/66 O2 saturation 93% on room air  In general this is a pleasant elderly female in no distress sitting comfortably in her wheelchair.  Her skin is warm and dry.  Oropharynx clear mucous membranes moist.  Chest she has a small amount of expiratory wheezing anterior field there is no labored breathing shallow air entry otherwise but I could not really appreciate any significant increased wheezing crackles or wheezes from baseline.  Heart is regular rate and rhythm with occasional irregular beat with a 2/6 systolic murmur.  She does have significant lower extremity edema was a 2+ she does have some history of venous stasis changes ---s edema doest appear to be slightly increased from her baseline  Muscle skeletal does move all extremities 4 ambulates in a wheelchair do not note any  deformities this appears to be baseline.  Neurologic is grossly intact her speech is clear no overt lateralizing findings  Psych she is oriented to self is pleasant and appropriate.  Labs.  06/22/2014.  Sodium 137 potassium 4 BUN 46 creatinine 1.93.  06/14/2014.  WBC 10.9 hemoglobin 10.7 platelets 270.  Sodium 136 potassium 4.3 BUN 54 creatinine 2.21.  Assessment and  plan.  #1-history of weight gain-appear she has gained about 5 pounds the past 5 days-clinically she certainly does not appear to be unstable but appears to have some slightly increased edema---  will obtain a chest x-ray.--Suspect we will increase her Demadex but would like to see with the chest x-ray tells [place]  Also will update a metabolic panel tomorrow with her history of renal insufficiency.  #2 hypertension this appears to be stable on current medications including the Lopressor and Norvasc.  #3-history renal insufficiency-please see #1 this appears to be relatively baseline but will have to keep an eye on this with her diuretics.  #4-history of COPD-she does continue on when necessary nebulizers as well as her inhaler-at this point appears relatively stable--she also continues on Mucinex   #5-history of anemia-I suspect this is due to her chronic renal insufficiency-will update a CBC as well.  #6-history of depression this appears to be stable she is on Paxil.  #7-history diabetes type 2 she is on low-dose Lantus as well as a sliding scale CBGs appear to be largely in the low--mid 100s at this point--with occasional spikes above 200 on occasion but this does not appear to be a regular basis and more so later in the day will monitor.--Also update hemoglobin A1c  #8-history of gout she is on allopurinol there've been no recent flares to my knowledge.  #9-history of hyperlipidemia she is on simvastatin will update a lipid panel as well as liver function tests.  #10-history GERD she continues on ranitidine this is been relatively stable for some time   Of note-we have obtained a chest x-ray which actually does not show any acute process--e does show cardiomegaly without overt pulmonary edema or focal airspace consolidation-chronic interstitial changes-this is reassuring-nonetheless she appears to be gaining a bit of edema here will give her an extra 20 mg of Demadex this evening and  monitor her weights and clinical status closely  [id number]-of note greater than 35 minutes spent assessing patient-discussing her status with nursing staff-reviewing her chart-and coordinating g and formulating a plan of care for numerous diagnoses-of note greater than 50% of time spent coordinating plan of care  .    Marland Kitchen

## 2014-07-21 ENCOUNTER — Non-Acute Institutional Stay (SKILLED_NURSING_FACILITY): Payer: Medicare Other | Admitting: Internal Medicine

## 2014-07-21 ENCOUNTER — Encounter: Payer: Self-pay | Admitting: Internal Medicine

## 2014-07-21 DIAGNOSIS — I5032 Chronic diastolic (congestive) heart failure: Secondary | ICD-10-CM

## 2014-07-21 DIAGNOSIS — D649 Anemia, unspecified: Secondary | ICD-10-CM

## 2014-07-21 DIAGNOSIS — R635 Abnormal weight gain: Secondary | ICD-10-CM

## 2014-07-21 LAB — GLUCOSE, CAPILLARY
GLUCOSE-CAPILLARY: 149 mg/dL — AB (ref 70–99)
Glucose-Capillary: 137 mg/dL — ABNORMAL HIGH (ref 70–99)
Glucose-Capillary: 126 mg/dL — ABNORMAL HIGH (ref 70–99)
Glucose-Capillary: 131 mg/dL — ABNORMAL HIGH (ref 70–99)
Glucose-Capillary: 275 mg/dL — ABNORMAL HIGH (ref 70–99)
Glucose-Capillary: 128 mg/dL — ABNORMAL HIGH (ref 70–99)
Glucose-Capillary: 166 mg/dL — ABNORMAL HIGH (ref 70–99)

## 2014-07-21 NOTE — Progress Notes (Signed)
Patient ID: [name], female   DOB: 1921-09-25, 80 y.o.   MRN: [id number]   This is an acute visit.  Level care skilled.  Facility [facility].  Chief complaint-acute visit follow-up on weight gain.--History of diastolic CHF.--Anemia  History of present illness.  Patient is a very pleasant 80 year old female who was noted to have a recent weight gain of about 5 pounds over the past several days and if this scales are to be relieved about 10 pounds over the past week.  A chest x-ray was ordered yesterday which did not show any acute changes.  She is on torsemide 80 mg in the morning 40 mg at night-she did get in next a 20 mg last night.  Weight today is comparable with what it was yesterday around 230 pounds-she does not complain of any increased shortness of breath or chest pain.  We did do labs she does have a history of renal insufficiency but this appears to be somewhat improved with a creatinine of 1.68 BUN of 40 previous creatinine was 1.93--with a BUN of 46.  I also note her hemoglobin is 8.9 which is down a bit from her baseline I did review recent CBCs and appears her hemoglobin averages around9.8-10's on the most recent labs.    Her vital signs continued to be stable O2 saturations are in the 90s on room air.  Family medical social history as been reviewed per progress notes including 06/19/2014.  Medications have been reviewed per MAR.  Review of systems.  In general does not complaining any fever or chills she has had some weight gain as noted above.  Head ears eyes nose mouth throat no complaints of any sore throat or visual changes.  Respiratory other than occasional cough is not complaining of any shortness of breath she does have a history of COPD.  Cardiac does not complaining of any chest pains continues with significant venous stasis lower extremity edema.  Neurologic no complaints of dizziness or headache.  Physical exam.  Temperature is  97.9 pulse 82 respirations 20 blood pressure 119/76 O2 saturation 95% on room air.  Gen. this a pleasant elderly female in no distress sitting comfortably in her wheelchair.  Her skin is warm and dry.  Chest is clear to auscultation with shallow air entry there is no labored breathing.  Heart is regular rate and rhythm with a 2/6 systolic murmur.  Continues with say with 2+ lower extremity edema this appears relatively baseline with yesterday's exam.  Labs.  07/21/2014.  WBC 9.2 hemoglobin 8.9 platelets 195.  Sodium 142 potassium 4 BUN 40 creatinine 1.68.  Liver function tests within normal limits except albumin of 3.2.   Assessment and plan.  #1-weight gain-edema-she has received slightly more Demadex at night and her weight appears to have stabilized as well as edema-at this point will increase her p.m. Demadex from 40 up to 60 mg a day continue 80 mg in the morning-renal function actually appears somewhat improved-although this will have to be monitored--  Will update a metabolic panel on Monday, November 23-.  #2 anemia-hemoglobin appears to have dropped about a point from her baseline-will order iron studies next lab day as well as B12 and folate-also will update a CBC when metabolic panel  drawn next Monday.  Also will orderguaic stools x 3-  I suspect this could be chronic anemia secondary to her renal issues  [id number] -    .

## 2014-07-22 LAB — GLUCOSE, CAPILLARY
GLUCOSE-CAPILLARY: 127 mg/dL — AB (ref 70–99)
GLUCOSE-CAPILLARY: 135 mg/dL — AB (ref 70–99)
Glucose-Capillary: 130 mg/dL — ABNORMAL HIGH (ref 70–99)
Glucose-Capillary: 197 mg/dL — ABNORMAL HIGH (ref 70–99)
Glucose-Capillary: 152 mg/dL — ABNORMAL HIGH (ref 70–99)

## 2014-07-23 LAB — GLUCOSE, CAPILLARY
GLUCOSE-CAPILLARY: 212 mg/dL — AB (ref 70–99)
Glucose-Capillary: 132 mg/dL — ABNORMAL HIGH (ref 70–99)
Glucose-Capillary: 135 mg/dL — ABNORMAL HIGH (ref 70–99)
Glucose-Capillary: 209 mg/dL — ABNORMAL HIGH (ref 70–99)

## 2014-07-24 LAB — GLUCOSE, CAPILLARY
GLUCOSE-CAPILLARY: 122 mg/dL — AB (ref 70–99)
GLUCOSE-CAPILLARY: 208 mg/dL — AB (ref 70–99)
GLUCOSE-CAPILLARY: 199 mg/dL — AB (ref 70–99)
Glucose-Capillary: 162 mg/dL — ABNORMAL HIGH (ref 70–99)

## 2014-07-25 LAB — GLUCOSE, CAPILLARY
GLUCOSE-CAPILLARY: 153 mg/dL — AB (ref 70–99)
GLUCOSE-CAPILLARY: 142 mg/dL — AB (ref 70–99)
GLUCOSE-CAPILLARY: 194 mg/dL — AB (ref 70–99)
Glucose-Capillary: 135 mg/dL — ABNORMAL HIGH (ref 70–99)
Glucose-Capillary: 198 mg/dL — ABNORMAL HIGH (ref 70–99)

## 2014-07-26 LAB — GLUCOSE, CAPILLARY
GLUCOSE-CAPILLARY: 254 mg/dL — AB (ref 70–99)
GLUCOSE-CAPILLARY: 119 mg/dL — AB (ref 70–99)
Glucose-Capillary: 140 mg/dL — ABNORMAL HIGH (ref 70–99)
Glucose-Capillary: 150 mg/dL — ABNORMAL HIGH (ref 70–99)

## 2014-07-27 LAB — GLUCOSE, CAPILLARY
GLUCOSE-CAPILLARY: 145 mg/dL — AB (ref 70–99)
GLUCOSE-CAPILLARY: 128 mg/dL — AB (ref 70–99)
GLUCOSE-CAPILLARY: 228 mg/dL — AB (ref 70–99)
GLUCOSE-CAPILLARY: 198 mg/dL — AB (ref 70–99)
GLUCOSE-CAPILLARY: 187 mg/dL — AB (ref 70–99)

## 2014-07-28 LAB — GLUCOSE, CAPILLARY
GLUCOSE-CAPILLARY: 142 mg/dL — AB (ref 70–99)
GLUCOSE-CAPILLARY: 172 mg/dL — AB (ref 70–99)
Glucose-Capillary: 128 mg/dL — ABNORMAL HIGH (ref 70–99)
Glucose-Capillary: 276 mg/dL — ABNORMAL HIGH (ref 70–99)
Glucose-Capillary: 150 mg/dL — ABNORMAL HIGH (ref 70–99)

## 2014-07-29 LAB — GLUCOSE, CAPILLARY
GLUCOSE-CAPILLARY: 146 mg/dL — AB (ref 70–99)
GLUCOSE-CAPILLARY: 287 mg/dL — AB (ref 70–99)
Glucose-Capillary: 147 mg/dL — ABNORMAL HIGH (ref 70–99)
Glucose-Capillary: 178 mg/dL — ABNORMAL HIGH (ref 70–99)

## 2014-07-30 LAB — GLUCOSE, CAPILLARY
GLUCOSE-CAPILLARY: 141 mg/dL — AB (ref 70–99)
Glucose-Capillary: 123 mg/dL — ABNORMAL HIGH (ref 70–99)
Glucose-Capillary: 161 mg/dL — ABNORMAL HIGH (ref 70–99)
Glucose-Capillary: 178 mg/dL — ABNORMAL HIGH (ref 70–99)

## 2014-07-31 LAB — GLUCOSE, CAPILLARY
GLUCOSE-CAPILLARY: 138 mg/dL — AB (ref 70–99)
GLUCOSE-CAPILLARY: 157 mg/dL — AB (ref 70–99)
GLUCOSE-CAPILLARY: 143 mg/dL — AB (ref 70–99)
Glucose-Capillary: 132 mg/dL — ABNORMAL HIGH (ref 70–99)
Glucose-Capillary: 254 mg/dL — ABNORMAL HIGH (ref 70–99)

## 2014-08-01 LAB — GLUCOSE, CAPILLARY
Glucose-Capillary: 131 mg/dL — ABNORMAL HIGH (ref 70–99)
Glucose-Capillary: 143 mg/dL — ABNORMAL HIGH (ref 70–99)
Glucose-Capillary: 198 mg/dL — ABNORMAL HIGH (ref 70–99)
Glucose-Capillary: 152 mg/dL — ABNORMAL HIGH (ref 70–99)

## 2014-08-02 LAB — GLUCOSE, CAPILLARY
GLUCOSE-CAPILLARY: 170 mg/dL — AB (ref 70–99)
GLUCOSE-CAPILLARY: 264 mg/dL — AB (ref 70–99)
GLUCOSE-CAPILLARY: 181 mg/dL — AB (ref 70–99)
Glucose-Capillary: 184 mg/dL — ABNORMAL HIGH (ref 70–99)

## 2014-08-03 LAB — GLUCOSE, CAPILLARY
GLUCOSE-CAPILLARY: 140 mg/dL — AB (ref 70–99)
GLUCOSE-CAPILLARY: 332 mg/dL — AB (ref 70–99)
Glucose-Capillary: 137 mg/dL — ABNORMAL HIGH (ref 70–99)
Glucose-Capillary: 132 mg/dL — ABNORMAL HIGH (ref 70–99)
Glucose-Capillary: 183 mg/dL — ABNORMAL HIGH (ref 70–99)

## 2014-08-04 LAB — GLUCOSE, CAPILLARY
GLUCOSE-CAPILLARY: 126 mg/dL — AB (ref 70–99)
GLUCOSE-CAPILLARY: 147 mg/dL — AB (ref 70–99)
Glucose-Capillary: 151 mg/dL — ABNORMAL HIGH (ref 70–99)
Glucose-Capillary: 240 mg/dL — ABNORMAL HIGH (ref 70–99)

## 2014-08-05 LAB — GLUCOSE, CAPILLARY
GLUCOSE-CAPILLARY: 143 mg/dL — AB (ref 70–99)
GLUCOSE-CAPILLARY: 280 mg/dL — AB (ref 70–99)
GLUCOSE-CAPILLARY: 155 mg/dL — AB (ref 70–99)
GLUCOSE-CAPILLARY: 159 mg/dL — AB (ref 70–99)

## 2014-08-06 LAB — GLUCOSE, CAPILLARY
GLUCOSE-CAPILLARY: 123 mg/dL — AB (ref 70–99)
GLUCOSE-CAPILLARY: 164 mg/dL — AB (ref 70–99)
Glucose-Capillary: 122 mg/dL — ABNORMAL HIGH (ref 70–99)
Glucose-Capillary: 206 mg/dL — ABNORMAL HIGH (ref 70–99)
Glucose-Capillary: 129 mg/dL — ABNORMAL HIGH (ref 70–99)

## 2014-08-07 LAB — GLUCOSE, CAPILLARY
GLUCOSE-CAPILLARY: 152 mg/dL — AB (ref 70–99)
GLUCOSE-CAPILLARY: 285 mg/dL — AB (ref 70–99)
Glucose-Capillary: 144 mg/dL — ABNORMAL HIGH (ref 70–99)
Glucose-Capillary: 274 mg/dL — ABNORMAL HIGH (ref 70–99)

## 2014-08-08 LAB — GLUCOSE, CAPILLARY
GLUCOSE-CAPILLARY: 181 mg/dL — AB (ref 70–99)
GLUCOSE-CAPILLARY: 138 mg/dL — AB (ref 70–99)
Glucose-Capillary: 133 mg/dL — ABNORMAL HIGH (ref 70–99)
Glucose-Capillary: 141 mg/dL — ABNORMAL HIGH (ref 70–99)
Glucose-Capillary: 211 mg/dL — ABNORMAL HIGH (ref 70–99)

## 2014-08-09 LAB — GLUCOSE, CAPILLARY
GLUCOSE-CAPILLARY: 137 mg/dL — AB (ref 70–99)
Glucose-Capillary: 148 mg/dL — ABNORMAL HIGH (ref 70–99)
Glucose-Capillary: 242 mg/dL — ABNORMAL HIGH (ref 70–99)

## 2014-08-10 LAB — GLUCOSE, CAPILLARY
GLUCOSE-CAPILLARY: 148 mg/dL — AB (ref 70–99)
GLUCOSE-CAPILLARY: 228 mg/dL — AB (ref 70–99)
GLUCOSE-CAPILLARY: 192 mg/dL — AB (ref 70–99)
Glucose-Capillary: 157 mg/dL — ABNORMAL HIGH (ref 70–99)
Glucose-Capillary: 187 mg/dL — ABNORMAL HIGH (ref 70–99)

## 2014-08-11 LAB — GLUCOSE, CAPILLARY
Glucose-Capillary: 150 mg/dL — ABNORMAL HIGH (ref 70–99)
Glucose-Capillary: 260 mg/dL — ABNORMAL HIGH (ref 70–99)
Glucose-Capillary: 125 mg/dL — ABNORMAL HIGH (ref 70–99)
Glucose-Capillary: 200 mg/dL — ABNORMAL HIGH (ref 70–99)

## 2014-08-12 LAB — GLUCOSE, CAPILLARY
GLUCOSE-CAPILLARY: 132 mg/dL — AB (ref 70–99)
Glucose-Capillary: 288 mg/dL — ABNORMAL HIGH (ref 70–99)
Glucose-Capillary: 150 mg/dL — ABNORMAL HIGH (ref 70–99)
Glucose-Capillary: 190 mg/dL — ABNORMAL HIGH (ref 70–99)

## 2014-08-13 LAB — GLUCOSE, CAPILLARY
GLUCOSE-CAPILLARY: 144 mg/dL — AB (ref 70–99)
Glucose-Capillary: 140 mg/dL — ABNORMAL HIGH (ref 70–99)
Glucose-Capillary: 257 mg/dL — ABNORMAL HIGH (ref 70–99)
Glucose-Capillary: 172 mg/dL — ABNORMAL HIGH (ref 70–99)
Glucose-Capillary: 208 mg/dL — ABNORMAL HIGH (ref 70–99)

## 2014-08-14 LAB — GLUCOSE, CAPILLARY
Glucose-Capillary: 140 mg/dL — ABNORMAL HIGH (ref 70–99)
Glucose-Capillary: 150 mg/dL — ABNORMAL HIGH (ref 70–99)
Glucose-Capillary: 254 mg/dL — ABNORMAL HIGH (ref 70–99)
Glucose-Capillary: 181 mg/dL — ABNORMAL HIGH (ref 70–99)
Glucose-Capillary: 159 mg/dL — ABNORMAL HIGH (ref 70–99)

## 2014-08-15 LAB — GLUCOSE, CAPILLARY
GLUCOSE-CAPILLARY: 151 mg/dL — AB (ref 70–99)
GLUCOSE-CAPILLARY: 169 mg/dL — AB (ref 70–99)
Glucose-Capillary: 149 mg/dL — ABNORMAL HIGH (ref 70–99)
Glucose-Capillary: 253 mg/dL — ABNORMAL HIGH (ref 70–99)
Glucose-Capillary: 195 mg/dL — ABNORMAL HIGH (ref 70–99)

## 2014-08-16 LAB — GLUCOSE, CAPILLARY
GLUCOSE-CAPILLARY: 261 mg/dL — AB (ref 70–99)
GLUCOSE-CAPILLARY: 123 mg/dL — AB (ref 70–99)
Glucose-Capillary: 161 mg/dL — ABNORMAL HIGH (ref 70–99)
Glucose-Capillary: 152 mg/dL — ABNORMAL HIGH (ref 70–99)
Glucose-Capillary: 96 mg/dL (ref 70–99)

## 2014-08-17 LAB — GLUCOSE, CAPILLARY
GLUCOSE-CAPILLARY: 230 mg/dL — AB (ref 70–99)
GLUCOSE-CAPILLARY: 201 mg/dL — AB (ref 70–99)
Glucose-Capillary: 143 mg/dL — ABNORMAL HIGH (ref 70–99)
Glucose-Capillary: 157 mg/dL — ABNORMAL HIGH (ref 70–99)
Glucose-Capillary: 159 mg/dL — ABNORMAL HIGH (ref 70–99)

## 2014-08-18 LAB — GLUCOSE, CAPILLARY
GLUCOSE-CAPILLARY: 146 mg/dL — AB (ref 70–99)
Glucose-Capillary: 156 mg/dL — ABNORMAL HIGH (ref 70–99)
Glucose-Capillary: 250 mg/dL — ABNORMAL HIGH (ref 70–99)
Glucose-Capillary: 173 mg/dL — ABNORMAL HIGH (ref 70–99)
Glucose-Capillary: 198 mg/dL — ABNORMAL HIGH (ref 70–99)

## 2014-08-19 LAB — GLUCOSE, CAPILLARY
GLUCOSE-CAPILLARY: 150 mg/dL — AB (ref 70–99)
GLUCOSE-CAPILLARY: 335 mg/dL — AB (ref 70–99)
Glucose-Capillary: 169 mg/dL — ABNORMAL HIGH (ref 70–99)
Glucose-Capillary: 143 mg/dL — ABNORMAL HIGH (ref 70–99)
Glucose-Capillary: 202 mg/dL — ABNORMAL HIGH (ref 70–99)

## 2014-08-20 LAB — GLUCOSE, CAPILLARY
GLUCOSE-CAPILLARY: 298 mg/dL — AB (ref 70–99)
GLUCOSE-CAPILLARY: 156 mg/dL — AB (ref 70–99)
Glucose-Capillary: 164 mg/dL — ABNORMAL HIGH (ref 70–99)
Glucose-Capillary: 245 mg/dL — ABNORMAL HIGH (ref 70–99)

## 2014-08-21 LAB — GLUCOSE, CAPILLARY
GLUCOSE-CAPILLARY: 163 mg/dL — AB (ref 70–99)
GLUCOSE-CAPILLARY: 224 mg/dL — AB (ref 70–99)
Glucose-Capillary: 179 mg/dL — ABNORMAL HIGH (ref 70–99)
Glucose-Capillary: 162 mg/dL — ABNORMAL HIGH (ref 70–99)
Glucose-Capillary: 245 mg/dL — ABNORMAL HIGH (ref 70–99)

## 2014-08-22 LAB — GLUCOSE, CAPILLARY
GLUCOSE-CAPILLARY: 143 mg/dL — AB (ref 70–99)
GLUCOSE-CAPILLARY: 281 mg/dL — AB (ref 70–99)
Glucose-Capillary: 136 mg/dL — ABNORMAL HIGH (ref 70–99)
Glucose-Capillary: 161 mg/dL — ABNORMAL HIGH (ref 70–99)
Glucose-Capillary: 184 mg/dL — ABNORMAL HIGH (ref 70–99)

## 2014-08-23 LAB — GLUCOSE, CAPILLARY
GLUCOSE-CAPILLARY: 285 mg/dL — AB (ref 70–99)
GLUCOSE-CAPILLARY: 177 mg/dL — AB (ref 70–99)
Glucose-Capillary: 141 mg/dL — ABNORMAL HIGH (ref 70–99)
Glucose-Capillary: 163 mg/dL — ABNORMAL HIGH (ref 70–99)
Glucose-Capillary: 193 mg/dL — ABNORMAL HIGH (ref 70–99)

## 2014-08-24 LAB — GLUCOSE, CAPILLARY
GLUCOSE-CAPILLARY: 179 mg/dL — AB (ref 70–99)
GLUCOSE-CAPILLARY: 260 mg/dL — AB (ref 70–99)
Glucose-Capillary: 156 mg/dL — ABNORMAL HIGH (ref 70–99)
Glucose-Capillary: 167 mg/dL — ABNORMAL HIGH (ref 70–99)
Glucose-Capillary: 261 mg/dL — ABNORMAL HIGH (ref 70–99)

## 2014-08-25 LAB — GLUCOSE, CAPILLARY
Glucose-Capillary: 153 mg/dL — ABNORMAL HIGH (ref 70–99)
Glucose-Capillary: 145 mg/dL — ABNORMAL HIGH (ref 70–99)
Glucose-Capillary: 246 mg/dL — ABNORMAL HIGH (ref 70–99)
Glucose-Capillary: 202 mg/dL — ABNORMAL HIGH (ref 70–99)
Glucose-Capillary: 205 mg/dL — ABNORMAL HIGH (ref 70–99)

## 2014-08-26 ENCOUNTER — Non-Acute Institutional Stay (SKILLED_NURSING_FACILITY): Payer: Medicare Other | Admitting: Internal Medicine

## 2014-08-26 ENCOUNTER — Encounter: Payer: Self-pay | Admitting: Internal Medicine

## 2014-08-26 DIAGNOSIS — E1121 Type 2 diabetes mellitus with diabetic nephropathy: Secondary | ICD-10-CM

## 2014-08-26 DIAGNOSIS — J441 Chronic obstructive pulmonary disease with (acute) exacerbation: Secondary | ICD-10-CM

## 2014-08-26 DIAGNOSIS — I5032 Chronic diastolic (congestive) heart failure: Secondary | ICD-10-CM

## 2014-08-26 DIAGNOSIS — N184 Chronic kidney disease, stage 4 (severe): Secondary | ICD-10-CM

## 2014-08-26 DIAGNOSIS — R635 Abnormal weight gain: Secondary | ICD-10-CM

## 2014-08-26 DIAGNOSIS — I10 Essential (primary) hypertension: Secondary | ICD-10-CM

## 2014-08-26 LAB — GLUCOSE, CAPILLARY
GLUCOSE-CAPILLARY: 168 mg/dL — AB (ref 70–99)
GLUCOSE-CAPILLARY: 190 mg/dL — AB (ref 70–99)
Glucose-Capillary: 153 mg/dL — ABNORMAL HIGH (ref 70–99)
Glucose-Capillary: 259 mg/dL — ABNORMAL HIGH (ref 70–99)
Glucose-Capillary: 200 mg/dL — ABNORMAL HIGH (ref 70–99)

## 2014-08-26 NOTE — Progress Notes (Signed)
Patient ID: CAILYNN BODNAR, female   DOB: 1922/06/15, 81 y.o.   MRN: [id number]     This is an acute-routine visit.  Level care skilled.  Facility [facility].  Chief complaint medical management of chronic medical conditions including COPD diastolic CHF renal insufficiency diabetes type 2 GERD depression.--Acute visit secondary to weight gain??  History of present illness.  Patient is a very pleasant 80 year old female with the above diagnoses.  She has been relatively stable most acute issues usually room all around her respiratory status with a history of COPD and diastolic CHF.  She is on torsemide 80 mg in the morning 60 mg at night-this was increased about a month ago secondary to some mildly increased edema and weight gain were her weight was up about 2:30-nursing staff has noted a possible weight gain of about 5 pounds over the past week-however per chart review it appears this is been relatively stable her weight today is 220 which is comparable to her weight late last month-and again down 10 pounds from when the Demadex was increased.  Weights recently appears to be in the high 2 teens-2 low 220s.  She does not report any increased cough or shortness of breath from baseline   .  Regards COPD she is on nebs every 4 hours when necessary as well as Mucinex routinely and Proventil inhaler.  She also has a history of diabetes type 2 she is on Lantus 5 units and per nursing staff her blood sugars appear to be stable largely in the lower  mid 100s. In the morning and late in the day-midday her sugar sometimes run a bit above 200--H-globin A1c last month was 7.3    In addition to the Lantus she also is on a sliding scale.  She does have a history of hypertension she is on Norvasc 5 mg a day as well as Lopressor 50 mg twice a day-- She has somewhat variable blood pressures I see a systolic in the 893T and 342A-J also sees some 160s  --I took herr blood pressure today  maually  got 162/70-  Recently noted her hemoglobin dropped to 8.9 on lab-this has rebounded now to above 10-lab work was ordered which did not appear alarming reticulocyte percent was elevated-I suspect there is an element of chronic disease here with her renal issues  Family medical social history has been reviewed per previous progress notes including 06/15/2014 and readmission note on 04/21/2014.  Medications have been reviewed per MAR.  Review of systems.  In general does not complaining fever or chills --her weight appears relatively stable.  Skin does not complain of any rashes or itching.  Head ears eyes nose mouth and throat-does not complain of any sore throat or visual changes.  Respiratory has an occasional cough is not complaining of shortness of breath again does have a significant history of COPD.  Cardiac does not complain of chest pain she does have fairly significant venous stasis lower extremity edema.  GI does not complain any of abdominal discomfort nausea vomiting diarrhea or constipation.  GU does not complain of dysuria.  Musculoskeletal-at times will complain of some pain under her right axilla arm--talking with her this is somewhat chronic and intermittent --has been going on for a while  Neurologic is not complaining of any dizziness syncopal-type episodes or headache.  Physical exam.  . She is afebrile pulse 64 respirations 20 blood pressure taken manually 162/70 weight is 220 which appears to be relatively stable  In  general this is a pleasant elderly female in no distress sitting comfortably in her chair.  Her skin is warm and dry.--Has numerous seborrheic keratosis  Oropharynx clear mucous membranes moist.  Chest she has shallow air entry but no labored breathing --a minimal amount of wheezing on the right side--this sounds baseline to actually slightly improved from previous exams   Heart is regular rate and rhythm with occasional irregular beat with a  2/6 systolic murmur.  She does have significant lower extremity edema  2+ she does have some history of venous stasis changes --- edema appears relatively baseline again she has significant venous stasis changes  Muscle skeletal does move all extremities 4 ambulates in a wheelchair do not note any deformities this appears to be baseline I could not appreciate any deformity  or lumps of her right shoulder axilla area-there is no erythema-some mild tenderness around the general area. Range of motion appears appropriate strength is intact right shoulder and arm-positive radial pulse  I do note she is status post left mastectomy with history of breast cancer-I could not appreciate any lumps or abnormalities of her right breast  Neurologic is grossly intact her speech is clear no overt lateralizing findings  Psych she is oriented to self is pleasant and appropriate.  Labs  07/25/2014.  WB C7.7 hemoglobin 10.1 platelets 238.  Sodium 142 potassium 3.5 BUN 31 creatinine 1.47 CO2 level 29  \Iron 55-total iron binding capacity 418-vitamin B12 390-folate 14-ferritin 94  07/21/2014.  Cholesterol 78-triglycerides 140-HDL 23-LDL 27.  Hemoglobin A1c 7.2.  07/21/2014-significant for hemoglobin of 8.9-creatinine of 1.68  -liver function tests within normal limits except albumin of 3.2  .  06/22/2014.  Sodium 137 potassium 4 BUN 46 creatinine 1.93.  06/14/2014.  WBC 10.9 hemoglobin 10.7 platelets 270.  Sodium 136 potassium 4.3 BUN 54 creatinine 2.21.  Assessment and plan.  #1-history of weight gain--diastolic CHF--actually this appears to have moderated since Demadex was increased-clinically she appears stable continue to monitor   Also will update a metabolic panelwith her history of renal insufficiency.  #2 hypertension-does appear to have some systolic elevations-will increase her Norvasc to 10 mg a day continues on Lopressor as well-check blood pressures daily with log for  provider review.  #3-history renal insufficiency-please see #1 this appears to be relatively baseline  To improved but will have to keep an eye on this with her diuretics.  #4-history of COPD-she does continue on when necessary nebulizers as well as her inhaler-at this point appears relatively stable--she also continues on Mucinex   #5-history of anemia-I suspect this is due to her chronic renal insufficiency-will update a CBC as well--.  #6-history of depression this appears to be stable she is on Paxil.  #7-history diabetes type 2 she is on low-dose Lantus as well as a sliding scale--CBGs appear to be relatively satisfactory-hemoglobin A1c recently was 7.3   #8-history of gout she is on allopurinol there've been no recent flares to my knowledge.  #9-history of hyperlipidemia she is on simvastatin --this appears relatively stable.  #10-history GERD she continues on ranitidine this is been r stable for some time  #11-right shoulder axilla discomfort-apparently this is intermittent-I could not really see anything concerning on physical exam but this will have to be monitored-Tylenol did provide pain relief  [id number]-of note greater than 40 minutes spent assessing patient-discussing her status with nursing staff-reviewing her chart-and coordinating and formulating a plan of care for numerous diagnoses-of note greater than 50% of time spent coordinating plan  of care  e  .    Marland Kitchen

## 2014-08-27 LAB — GLUCOSE, CAPILLARY
GLUCOSE-CAPILLARY: 165 mg/dL — AB (ref 70–99)
GLUCOSE-CAPILLARY: 295 mg/dL — AB (ref 70–99)
GLUCOSE-CAPILLARY: 180 mg/dL — AB (ref 70–99)
Glucose-Capillary: 181 mg/dL — ABNORMAL HIGH (ref 70–99)

## 2014-08-28 LAB — GLUCOSE, CAPILLARY
GLUCOSE-CAPILLARY: 174 mg/dL — AB (ref 70–99)
Glucose-Capillary: 160 mg/dL — ABNORMAL HIGH (ref 70–99)
Glucose-Capillary: 300 mg/dL — ABNORMAL HIGH (ref 70–99)
Glucose-Capillary: 208 mg/dL — ABNORMAL HIGH (ref 70–99)

## 2014-08-29 LAB — GLUCOSE, CAPILLARY
GLUCOSE-CAPILLARY: 150 mg/dL — AB (ref 70–99)
GLUCOSE-CAPILLARY: 152 mg/dL — AB (ref 70–99)
GLUCOSE-CAPILLARY: 314 mg/dL — AB (ref 70–99)
GLUCOSE-CAPILLARY: 197 mg/dL — AB (ref 70–99)
Glucose-Capillary: 183 mg/dL — ABNORMAL HIGH (ref 70–99)

## 2014-08-30 LAB — GLUCOSE, CAPILLARY
GLUCOSE-CAPILLARY: 343 mg/dL — AB (ref 70–99)
GLUCOSE-CAPILLARY: 150 mg/dL — AB (ref 70–99)
Glucose-Capillary: 201 mg/dL — ABNORMAL HIGH (ref 70–99)
Glucose-Capillary: 172 mg/dL — ABNORMAL HIGH (ref 70–99)
Glucose-Capillary: 205 mg/dL — ABNORMAL HIGH (ref 70–99)

## 2014-08-31 LAB — GLUCOSE, CAPILLARY
GLUCOSE-CAPILLARY: 167 mg/dL — AB (ref 70–99)
GLUCOSE-CAPILLARY: 271 mg/dL — AB (ref 70–99)
Glucose-Capillary: 188 mg/dL — ABNORMAL HIGH (ref 70–99)
Glucose-Capillary: 164 mg/dL — ABNORMAL HIGH (ref 70–99)
Glucose-Capillary: 237 mg/dL — ABNORMAL HIGH (ref 70–99)

## 2014-09-01 LAB — GLUCOSE, CAPILLARY
GLUCOSE-CAPILLARY: 194 mg/dL — AB (ref 70–99)
Glucose-Capillary: 163 mg/dL — ABNORMAL HIGH (ref 70–99)
Glucose-Capillary: 162 mg/dL — ABNORMAL HIGH (ref 70–99)
Glucose-Capillary: 322 mg/dL — ABNORMAL HIGH (ref 70–99)
Glucose-Capillary: 207 mg/dL — ABNORMAL HIGH (ref 70–99)

## 2014-09-02 LAB — GLUCOSE, CAPILLARY
GLUCOSE-CAPILLARY: 161 mg/dL — AB (ref 70–99)
GLUCOSE-CAPILLARY: 192 mg/dL — AB (ref 70–99)
Glucose-Capillary: 175 mg/dL — ABNORMAL HIGH (ref 70–99)
Glucose-Capillary: 200 mg/dL — ABNORMAL HIGH (ref 70–99)
Glucose-Capillary: 184 mg/dL — ABNORMAL HIGH (ref 70–99)

## 2014-09-03 LAB — GLUCOSE, CAPILLARY
GLUCOSE-CAPILLARY: 256 mg/dL — AB (ref 70–99)
GLUCOSE-CAPILLARY: 195 mg/dL — AB (ref 70–99)
Glucose-Capillary: 151 mg/dL — ABNORMAL HIGH (ref 70–99)
Glucose-Capillary: 159 mg/dL — ABNORMAL HIGH (ref 70–99)
Glucose-Capillary: 170 mg/dL — ABNORMAL HIGH (ref 70–99)

## 2014-09-04 LAB — GLUCOSE, CAPILLARY
GLUCOSE-CAPILLARY: 160 mg/dL — AB (ref 70–99)
Glucose-Capillary: 159 mg/dL — ABNORMAL HIGH (ref 70–99)
Glucose-Capillary: 260 mg/dL — ABNORMAL HIGH (ref 70–99)
Glucose-Capillary: 184 mg/dL — ABNORMAL HIGH (ref 70–99)
Glucose-Capillary: 190 mg/dL — ABNORMAL HIGH (ref 70–99)

## 2014-09-05 LAB — GLUCOSE, CAPILLARY
GLUCOSE-CAPILLARY: 158 mg/dL — AB (ref 70–99)
GLUCOSE-CAPILLARY: 332 mg/dL — AB (ref 70–99)
GLUCOSE-CAPILLARY: 148 mg/dL — AB (ref 70–99)
Glucose-Capillary: 165 mg/dL — ABNORMAL HIGH (ref 70–99)
Glucose-Capillary: 188 mg/dL — ABNORMAL HIGH (ref 70–99)

## 2014-09-06 LAB — GLUCOSE, CAPILLARY
GLUCOSE-CAPILLARY: 152 mg/dL — AB (ref 70–99)
GLUCOSE-CAPILLARY: 185 mg/dL — AB (ref 70–99)
GLUCOSE-CAPILLARY: 215 mg/dL — AB (ref 70–99)
Glucose-Capillary: 148 mg/dL — ABNORMAL HIGH (ref 70–99)
Glucose-Capillary: 172 mg/dL — ABNORMAL HIGH (ref 70–99)

## 2014-09-07 LAB — GLUCOSE, CAPILLARY
GLUCOSE-CAPILLARY: 151 mg/dL — AB (ref 70–99)
GLUCOSE-CAPILLARY: 191 mg/dL — AB (ref 70–99)
Glucose-Capillary: 155 mg/dL — ABNORMAL HIGH (ref 70–99)
Glucose-Capillary: 245 mg/dL — ABNORMAL HIGH (ref 70–99)
Glucose-Capillary: 218 mg/dL — ABNORMAL HIGH (ref 70–99)

## 2014-09-08 LAB — GLUCOSE, CAPILLARY
GLUCOSE-CAPILLARY: 155 mg/dL — AB (ref 70–99)
GLUCOSE-CAPILLARY: 151 mg/dL — AB (ref 70–99)
GLUCOSE-CAPILLARY: 297 mg/dL — AB (ref 70–99)
GLUCOSE-CAPILLARY: 229 mg/dL — AB (ref 70–99)
Glucose-Capillary: 214 mg/dL — ABNORMAL HIGH (ref 70–99)

## 2014-09-09 LAB — GLUCOSE, CAPILLARY
Glucose-Capillary: 169 mg/dL — ABNORMAL HIGH (ref 70–99)
Glucose-Capillary: 154 mg/dL — ABNORMAL HIGH (ref 70–99)
Glucose-Capillary: 245 mg/dL — ABNORMAL HIGH (ref 70–99)
Glucose-Capillary: 172 mg/dL — ABNORMAL HIGH (ref 70–99)
Glucose-Capillary: 236 mg/dL — ABNORMAL HIGH (ref 70–99)

## 2014-09-10 LAB — GLUCOSE, CAPILLARY
GLUCOSE-CAPILLARY: 178 mg/dL — AB (ref 70–99)
GLUCOSE-CAPILLARY: 206 mg/dL — AB (ref 70–99)
Glucose-Capillary: 153 mg/dL — ABNORMAL HIGH (ref 70–99)
Glucose-Capillary: 314 mg/dL — ABNORMAL HIGH (ref 70–99)

## 2014-09-11 LAB — GLUCOSE, CAPILLARY
GLUCOSE-CAPILLARY: 173 mg/dL — AB (ref 70–99)
Glucose-Capillary: 150 mg/dL — ABNORMAL HIGH (ref 70–99)
Glucose-Capillary: 156 mg/dL — ABNORMAL HIGH (ref 70–99)
Glucose-Capillary: 270 mg/dL — ABNORMAL HIGH (ref 70–99)
Glucose-Capillary: 182 mg/dL — ABNORMAL HIGH (ref 70–99)

## 2014-09-12 LAB — GLUCOSE, CAPILLARY
GLUCOSE-CAPILLARY: 290 mg/dL — AB (ref 70–99)
GLUCOSE-CAPILLARY: 172 mg/dL — AB (ref 70–99)
GLUCOSE-CAPILLARY: 181 mg/dL — AB (ref 70–99)
Glucose-Capillary: 148 mg/dL — ABNORMAL HIGH (ref 70–99)
Glucose-Capillary: 163 mg/dL — ABNORMAL HIGH (ref 70–99)

## 2014-09-13 LAB — GLUCOSE, CAPILLARY
GLUCOSE-CAPILLARY: 192 mg/dL — AB (ref 70–99)
Glucose-Capillary: 152 mg/dL — ABNORMAL HIGH (ref 70–99)
Glucose-Capillary: 183 mg/dL — ABNORMAL HIGH (ref 70–99)
Glucose-Capillary: 210 mg/dL — ABNORMAL HIGH (ref 70–99)
Glucose-Capillary: 168 mg/dL — ABNORMAL HIGH (ref 70–99)

## 2014-09-14 LAB — GLUCOSE, CAPILLARY
GLUCOSE-CAPILLARY: 142 mg/dL — AB (ref 70–99)
GLUCOSE-CAPILLARY: 263 mg/dL — AB (ref 70–99)
GLUCOSE-CAPILLARY: 214 mg/dL — AB (ref 70–99)
Glucose-Capillary: 153 mg/dL — ABNORMAL HIGH (ref 70–99)
Glucose-Capillary: 220 mg/dL — ABNORMAL HIGH (ref 70–99)

## 2014-09-15 LAB — GLUCOSE, CAPILLARY
Glucose-Capillary: 194 mg/dL — ABNORMAL HIGH (ref 70–99)
Glucose-Capillary: 180 mg/dL — ABNORMAL HIGH (ref 70–99)
Glucose-Capillary: 316 mg/dL — ABNORMAL HIGH (ref 70–99)
Glucose-Capillary: 192 mg/dL — ABNORMAL HIGH (ref 70–99)
Glucose-Capillary: 206 mg/dL — ABNORMAL HIGH (ref 70–99)

## 2014-09-16 LAB — GLUCOSE, CAPILLARY
GLUCOSE-CAPILLARY: 172 mg/dL — AB (ref 70–99)
Glucose-Capillary: 186 mg/dL — ABNORMAL HIGH (ref 70–99)
Glucose-Capillary: 288 mg/dL — ABNORMAL HIGH (ref 70–99)
Glucose-Capillary: 221 mg/dL — ABNORMAL HIGH (ref 70–99)

## 2014-09-17 ENCOUNTER — Inpatient Hospital Stay (HOSPITAL_COMMUNITY): Payer: No Typology Code available for payment source | Attending: Internal Medicine

## 2014-09-17 LAB — GLUCOSE, CAPILLARY
GLUCOSE-CAPILLARY: 174 mg/dL — AB (ref 70–99)
GLUCOSE-CAPILLARY: 174 mg/dL — AB (ref 70–99)
GLUCOSE-CAPILLARY: 184 mg/dL — AB (ref 70–99)
Glucose-Capillary: 343 mg/dL — ABNORMAL HIGH (ref 70–99)

## 2014-09-18 LAB — GLUCOSE, CAPILLARY
Glucose-Capillary: 163 mg/dL — ABNORMAL HIGH (ref 70–99)
Glucose-Capillary: 168 mg/dL — ABNORMAL HIGH (ref 70–99)
Glucose-Capillary: 241 mg/dL — ABNORMAL HIGH (ref 70–99)
Glucose-Capillary: 262 mg/dL — ABNORMAL HIGH (ref 70–99)
Glucose-Capillary: 264 mg/dL — ABNORMAL HIGH (ref 70–99)

## 2014-09-19 ENCOUNTER — Non-Acute Institutional Stay (SKILLED_NURSING_FACILITY): Payer: Medicare Other | Admitting: Internal Medicine

## 2014-09-19 DIAGNOSIS — N289 Disorder of kidney and ureter, unspecified: Secondary | ICD-10-CM

## 2014-09-19 DIAGNOSIS — R635 Abnormal weight gain: Secondary | ICD-10-CM

## 2014-09-19 DIAGNOSIS — I5032 Chronic diastolic (congestive) heart failure: Secondary | ICD-10-CM

## 2014-09-19 LAB — GLUCOSE, CAPILLARY
GLUCOSE-CAPILLARY: 151 mg/dL — AB (ref 70–99)
GLUCOSE-CAPILLARY: 324 mg/dL — AB (ref 70–99)
GLUCOSE-CAPILLARY: 190 mg/dL — AB (ref 70–99)
Glucose-Capillary: 131 mg/dL — ABNORMAL HIGH (ref 70–99)
Glucose-Capillary: 169 mg/dL — ABNORMAL HIGH (ref 70–99)

## 2014-09-20 LAB — GLUCOSE, CAPILLARY
GLUCOSE-CAPILLARY: 155 mg/dL — AB (ref 70–99)
GLUCOSE-CAPILLARY: 353 mg/dL — AB (ref 70–99)
GLUCOSE-CAPILLARY: 200 mg/dL — AB (ref 70–99)
GLUCOSE-CAPILLARY: 194 mg/dL — AB (ref 70–99)
Glucose-Capillary: 111 mg/dL — ABNORMAL HIGH (ref 70–99)

## 2014-09-21 LAB — GLUCOSE, CAPILLARY
GLUCOSE-CAPILLARY: 196 mg/dL — AB (ref 70–99)
Glucose-Capillary: 163 mg/dL — ABNORMAL HIGH (ref 70–99)
Glucose-Capillary: 154 mg/dL — ABNORMAL HIGH (ref 70–99)
Glucose-Capillary: 253 mg/dL — ABNORMAL HIGH (ref 70–99)
Glucose-Capillary: 216 mg/dL — ABNORMAL HIGH (ref 70–99)

## 2014-09-21 NOTE — Progress Notes (Addendum)
Patient ID: [name], female   DOB: 1922-07-15, 81 y.o.   MRN: [id number]               PROGRESS NOTE  DATE:  09/19/2014            FACILITY: [facility]                       LEVEL OF CARE:   SNF   Acute Visit   CHIEF COMPLAINT:  Weight gain.                    HISTORY OF PRESENT ILLNESS:  Apparently over the weekend, the patient was noted to have gained weight.  She received an extra 20 mg of Demadex.  A chest x-ray was negative.    LABS/RADIOLOGY:  Chest x-ray showed calcified adenopathy in the right hilum and right peritracheal areas, and a calcified granuloma in the right upper lobe compatible with old granulomatous disease and anterior basal scarring.    Lab work showed a white count of 7.4, hemoglobin of 12.1, platelets of 224,000.    Sodium was 139, potassium of 4.1, BUN of 52, and a creatinine of 1.81.  The BUN and creatinine are within range with her known chronic renal failure.    Albumin was 4.1.    CURRENT MEDICATIONS:  Medication list is reviewed.      Allopurinol 200 q.d.    ASA 81 q.d.                  Cranberry 475 q.12.              Demadex 80 mg in the morning and 60 mg in the evening.    DuoNebs q.4 hours p.r.n.          Lopressor 50 b.i.d.      Paxil 20 q.d.         Proventil inhaler 2 puffs every 6 hours p.r.n.            Ranitidine 150 at bedtime.    Simvastatin 10 q.d.      Tessalon Perles p.r.n.     REVIEW OF SYSTEMS:   EDEMA/VARICOSITIES:  The patient bitterly complains about lower extremity edema.  Her weights were 217 pounds on 09/06/2014 to 219 pounds on 09/18/2014.                   PHYSICAL EXAMINATION:   CHEST/RESPIRATORY:  Generally clear air entry bilaterally.   CARDIOVASCULAR:  CARDIAC:  2/6 midsystolic murmur.  JVP is not elevated.     EDEMA/VARICOSITIES:  She has scant coccyx edema.  Extremities:  She has venous stasis and probably edema because of this.    ASSESSMENT/PLAN:                             Increasing weights noted over the weekend.  I really do not see this.  She is on a large dose of Demadex.  Previous echocardiogram done in April of last year showed a normal left ventricular function, a mildly dilated right ventricle, and significant pulmonary hypertension with a PA pressure of 64.    Chronic renal failure.   This appears to be stable.    Likely some degree of venous stasis and stasis dermatitis.  She flatly refuses to wear stockings.  I do not think she keeps her legs elevated with any consistency,  either.    Type 2 diabetes.  As I am looking at her labs, it seems that she is only on a sliding scale.     Hypertension.  Again, I do not see this, either.  Blood pressure from yesterday was 132/46.

## 2014-09-22 LAB — GLUCOSE, CAPILLARY
GLUCOSE-CAPILLARY: 183 mg/dL — AB (ref 70–99)
GLUCOSE-CAPILLARY: 282 mg/dL — AB (ref 70–99)
GLUCOSE-CAPILLARY: 248 mg/dL — AB (ref 70–99)
Glucose-Capillary: 158 mg/dL — ABNORMAL HIGH (ref 70–99)

## 2014-09-23 LAB — GLUCOSE, CAPILLARY
GLUCOSE-CAPILLARY: 155 mg/dL — AB (ref 70–99)
GLUCOSE-CAPILLARY: 192 mg/dL — AB (ref 70–99)
Glucose-Capillary: 159 mg/dL — ABNORMAL HIGH (ref 70–99)
Glucose-Capillary: 225 mg/dL — ABNORMAL HIGH (ref 70–99)
Glucose-Capillary: 222 mg/dL — ABNORMAL HIGH (ref 70–99)

## 2014-09-24 LAB — GLUCOSE, CAPILLARY
GLUCOSE-CAPILLARY: 177 mg/dL — AB (ref 70–99)
GLUCOSE-CAPILLARY: 301 mg/dL — AB (ref 70–99)
GLUCOSE-CAPILLARY: 232 mg/dL — AB (ref 70–99)
Glucose-Capillary: 167 mg/dL — ABNORMAL HIGH (ref 70–99)
Glucose-Capillary: 170 mg/dL — ABNORMAL HIGH (ref 70–99)

## 2014-09-25 LAB — GLUCOSE, CAPILLARY
GLUCOSE-CAPILLARY: 188 mg/dL — AB (ref 70–99)
Glucose-Capillary: 181 mg/dL — ABNORMAL HIGH (ref 70–99)
Glucose-Capillary: 168 mg/dL — ABNORMAL HIGH (ref 70–99)
Glucose-Capillary: 263 mg/dL — ABNORMAL HIGH (ref 70–99)
Glucose-Capillary: 206 mg/dL — ABNORMAL HIGH (ref 70–99)

## 2014-09-26 LAB — GLUCOSE, CAPILLARY
GLUCOSE-CAPILLARY: 150 mg/dL — AB (ref 70–99)
Glucose-Capillary: 168 mg/dL — ABNORMAL HIGH (ref 70–99)
Glucose-Capillary: 150 mg/dL — ABNORMAL HIGH (ref 70–99)
Glucose-Capillary: 319 mg/dL — ABNORMAL HIGH (ref 70–99)
Glucose-Capillary: 244 mg/dL — ABNORMAL HIGH (ref 70–99)

## 2014-09-27 LAB — GLUCOSE, CAPILLARY
GLUCOSE-CAPILLARY: 297 mg/dL — AB (ref 70–99)
Glucose-Capillary: 161 mg/dL — ABNORMAL HIGH (ref 70–99)
Glucose-Capillary: 169 mg/dL — ABNORMAL HIGH (ref 70–99)
Glucose-Capillary: 221 mg/dL — ABNORMAL HIGH (ref 70–99)

## 2014-09-28 LAB — GLUCOSE, CAPILLARY
GLUCOSE-CAPILLARY: 155 mg/dL — AB (ref 70–99)
GLUCOSE-CAPILLARY: 167 mg/dL — AB (ref 70–99)
GLUCOSE-CAPILLARY: 266 mg/dL — AB (ref 70–99)
Glucose-Capillary: 147 mg/dL — ABNORMAL HIGH (ref 70–99)
Glucose-Capillary: 225 mg/dL — ABNORMAL HIGH (ref 70–99)

## 2014-09-29 LAB — GLUCOSE, CAPILLARY
GLUCOSE-CAPILLARY: 315 mg/dL — AB (ref 70–99)
GLUCOSE-CAPILLARY: 213 mg/dL — AB (ref 70–99)
Glucose-Capillary: 144 mg/dL — ABNORMAL HIGH (ref 70–99)
Glucose-Capillary: 147 mg/dL — ABNORMAL HIGH (ref 70–99)
Glucose-Capillary: 201 mg/dL — ABNORMAL HIGH (ref 70–99)

## 2014-09-30 LAB — GLUCOSE, CAPILLARY
GLUCOSE-CAPILLARY: 153 mg/dL — AB (ref 70–99)
GLUCOSE-CAPILLARY: 322 mg/dL — AB (ref 70–99)
GLUCOSE-CAPILLARY: 162 mg/dL — AB (ref 70–99)
Glucose-Capillary: 146 mg/dL — ABNORMAL HIGH (ref 70–99)

## 2014-10-01 LAB — GLUCOSE, CAPILLARY
GLUCOSE-CAPILLARY: 159 mg/dL — AB (ref 70–99)
Glucose-Capillary: 163 mg/dL — ABNORMAL HIGH (ref 70–99)
Glucose-Capillary: 278 mg/dL — ABNORMAL HIGH (ref 70–99)
Glucose-Capillary: 210 mg/dL — ABNORMAL HIGH (ref 70–99)

## 2014-10-02 LAB — GLUCOSE, CAPILLARY
GLUCOSE-CAPILLARY: 161 mg/dL — AB (ref 70–99)
GLUCOSE-CAPILLARY: 239 mg/dL — AB (ref 70–99)
Glucose-Capillary: 168 mg/dL — ABNORMAL HIGH (ref 70–99)
Glucose-Capillary: 169 mg/dL — ABNORMAL HIGH (ref 70–99)
Glucose-Capillary: 169 mg/dL — ABNORMAL HIGH (ref 70–99)

## 2014-10-03 LAB — GLUCOSE, CAPILLARY
GLUCOSE-CAPILLARY: 153 mg/dL — AB (ref 70–99)
GLUCOSE-CAPILLARY: 258 mg/dL — AB (ref 70–99)
GLUCOSE-CAPILLARY: 167 mg/dL — AB (ref 70–99)
GLUCOSE-CAPILLARY: 163 mg/dL — AB (ref 70–99)

## 2014-10-04 LAB — GLUCOSE, CAPILLARY
GLUCOSE-CAPILLARY: 174 mg/dL — AB (ref 70–99)
Glucose-Capillary: 157 mg/dL — ABNORMAL HIGH (ref 70–99)
Glucose-Capillary: 134 mg/dL — ABNORMAL HIGH (ref 70–99)
Glucose-Capillary: 278 mg/dL — ABNORMAL HIGH (ref 70–99)
Glucose-Capillary: 184 mg/dL — ABNORMAL HIGH (ref 70–99)

## 2014-10-05 LAB — GLUCOSE, CAPILLARY
GLUCOSE-CAPILLARY: 163 mg/dL — AB (ref 70–99)
GLUCOSE-CAPILLARY: 230 mg/dL — AB (ref 70–99)
GLUCOSE-CAPILLARY: 147 mg/dL — AB (ref 70–99)
GLUCOSE-CAPILLARY: 160 mg/dL — AB (ref 70–99)

## 2014-10-06 LAB — GLUCOSE, CAPILLARY
GLUCOSE-CAPILLARY: 136 mg/dL — AB (ref 70–99)
Glucose-Capillary: 146 mg/dL — ABNORMAL HIGH (ref 70–99)
Glucose-Capillary: 262 mg/dL — ABNORMAL HIGH (ref 70–99)
Glucose-Capillary: 147 mg/dL — ABNORMAL HIGH (ref 70–99)

## 2014-10-07 LAB — GLUCOSE, CAPILLARY
GLUCOSE-CAPILLARY: 193 mg/dL — AB (ref 70–99)
Glucose-Capillary: 136 mg/dL — ABNORMAL HIGH (ref 70–99)
Glucose-Capillary: 148 mg/dL — ABNORMAL HIGH (ref 70–99)
Glucose-Capillary: 193 mg/dL — ABNORMAL HIGH (ref 70–99)

## 2014-10-08 LAB — GLUCOSE, CAPILLARY
GLUCOSE-CAPILLARY: 146 mg/dL — AB (ref 70–99)
GLUCOSE-CAPILLARY: 187 mg/dL — AB (ref 70–99)
GLUCOSE-CAPILLARY: 164 mg/dL — AB (ref 70–99)
Glucose-Capillary: 265 mg/dL — ABNORMAL HIGH (ref 70–99)

## 2014-10-09 LAB — GLUCOSE, CAPILLARY
GLUCOSE-CAPILLARY: 188 mg/dL — AB (ref 70–99)
GLUCOSE-CAPILLARY: 196 mg/dL — AB (ref 70–99)
Glucose-Capillary: 153 mg/dL — ABNORMAL HIGH (ref 70–99)
Glucose-Capillary: 277 mg/dL — ABNORMAL HIGH (ref 70–99)

## 2014-10-10 LAB — GLUCOSE, CAPILLARY: GLUCOSE-CAPILLARY: 162 mg/dL — AB (ref 70–99)

## 2014-10-11 LAB — GLUCOSE, CAPILLARY
GLUCOSE-CAPILLARY: 217 mg/dL — AB (ref 70–99)
Glucose-Capillary: 229 mg/dL — ABNORMAL HIGH (ref 70–99)
Glucose-Capillary: 171 mg/dL — ABNORMAL HIGH (ref 70–99)
Glucose-Capillary: 146 mg/dL — ABNORMAL HIGH (ref 70–99)
Glucose-Capillary: 164 mg/dL — ABNORMAL HIGH (ref 70–99)
Glucose-Capillary: 177 mg/dL — ABNORMAL HIGH (ref 70–99)

## 2014-10-12 LAB — GLUCOSE, CAPILLARY
GLUCOSE-CAPILLARY: 183 mg/dL — AB (ref 70–99)
Glucose-Capillary: 148 mg/dL — ABNORMAL HIGH (ref 70–99)
Glucose-Capillary: 139 mg/dL — ABNORMAL HIGH (ref 70–99)
Glucose-Capillary: 148 mg/dL — ABNORMAL HIGH (ref 70–99)
Glucose-Capillary: 173 mg/dL — ABNORMAL HIGH (ref 70–99)

## 2014-10-13 LAB — GLUCOSE, CAPILLARY
GLUCOSE-CAPILLARY: 219 mg/dL — AB (ref 70–99)
GLUCOSE-CAPILLARY: 161 mg/dL — AB (ref 70–99)
Glucose-Capillary: 147 mg/dL — ABNORMAL HIGH (ref 70–99)
Glucose-Capillary: 130 mg/dL — ABNORMAL HIGH (ref 70–99)
Glucose-Capillary: 192 mg/dL — ABNORMAL HIGH (ref 70–99)

## 2014-10-14 ENCOUNTER — Non-Acute Institutional Stay (SKILLED_NURSING_FACILITY): Payer: Medicare Other | Admitting: Internal Medicine

## 2014-10-14 DIAGNOSIS — I5033 Acute on chronic diastolic (congestive) heart failure: Secondary | ICD-10-CM

## 2014-10-14 DIAGNOSIS — N289 Disorder of kidney and ureter, unspecified: Secondary | ICD-10-CM

## 2014-10-14 DIAGNOSIS — J441 Chronic obstructive pulmonary disease with (acute) exacerbation: Secondary | ICD-10-CM

## 2014-10-14 DIAGNOSIS — R635 Abnormal weight gain: Secondary | ICD-10-CM

## 2014-10-14 DIAGNOSIS — E1121 Type 2 diabetes mellitus with diabetic nephropathy: Secondary | ICD-10-CM

## 2014-10-14 DIAGNOSIS — I10 Essential (primary) hypertension: Secondary | ICD-10-CM

## 2014-10-14 LAB — GLUCOSE, CAPILLARY
GLUCOSE-CAPILLARY: 157 mg/dL — AB (ref 70–99)
GLUCOSE-CAPILLARY: 210 mg/dL — AB (ref 70–99)
GLUCOSE-CAPILLARY: 212 mg/dL — AB (ref 70–99)
Glucose-Capillary: 156 mg/dL — ABNORMAL HIGH (ref 70–99)

## 2014-10-14 NOTE — Progress Notes (Signed)
Patient ID: [name], female   DOB: 1922-04-16, 81 y.o.   MRN: [id number]   his is an acute-routine visit.  Level care skilled.  Facility [facility].  Chief complaint medical management of chronic medical conditions including COPD diastolic CHF renal insufficiency diabetes type 2 GERD depression.--Acute visit secondary to increased edema  History of present illness.  Patient is a very pleasant 80 year old female with the above diagnoses.  She has been relatively stable most acute issues usually revolve  around her respiratory status with a history of COPD and diastolic CHF.  She is on torsemide 80 mg in the morning 60 mg at night-this was increased about two months ago secondary to some mildly increased edema and weight gain were her weight was up about 2:30--subsequently  moderated to around 222-nursing staff has noted a possible weight gain again  about 7 pounds over the past week-.  Per nursing staff and her daughter who is quite attentive she appears to have some increased lower extremity edema.  Patient does not complain of increased shortness of breath-her daughter at times has noted some mild expiratory wheezing  She did have a cardiac echo done in April 2015 which showed an ejection fraction of 65-70 percent with grade 1 diastolic dysfunction . Marland Kitchen  She does not report any increased cough or shortness of breath from baseline--but her daughter has noted some wheezing   .  Regards COPD she is on nebs every 4 hours when necessary as well as Mucinex routinely and Proventil inhaler.  She also has a history of diabetes type 2 she is on Lantus 5 units and per nursing staff her blood sugars appear to be stable largely in the lower -- mid 100s. In the morning and later in the day-midday her sugar sometimes run a bit above 200--H-globin A1c in November was 7.3--we'll update this    In addition to the Lantus she also is on a sliding scale.  She does have a history of  hypertension she is on Norvasc 10 mg a day as well as Lopressor 50 mg twice a day-- She has somewhat variable blood pressures -- I got 146/64 manually-I see previous readings ranging from 126/58-154/61-I do not really see consistent elevations  Recently noted her hemoglobin dropped to 8.9 on lab-this has rebounded now to12.1-lab work was ordered which did not appear alarming reticulocyte percent was elevated-I suspect there is an element of chronic disease here with her renal issues--again this appears improved--  Family medical social history has been reviewed per previous progress notes including 06/15/2014 and readmission note on 04/21/2014.  Medications have been reviewed per MAR.  Review of systems.  In general does not complaining fever or chills --appears to have gained weight as noted above  Skin does not complain of any rashes or itching.  Head ears eyes nose mouth and throat-does not complain of any sore throat or visual changes However her right ear feels irritating apparently there is some exudate in the mornings.  Respiratory has an occasional cough is not complaining of shortness of breath again does have a significant history of COPD.--With some wheezing per daughter  Cardiac does not complain of chest pain she does have fairly significant venous stasis lower extremity edema. Which appears to have increased some  GI does not complain any of abdominal discomfort nausea vomiting diarrhea or constipation.  GU does not complain of dysuria.  Musculoskeletal Does not complain of joint pain today  Neurologic is not complaining of any dizziness  syncopal-type episodes or headache.  Physical exam.  . She is Afebrile pulse 68 respirations 18 blood pressure taken manually 146/64 variability as noted above most recent weight is   229   In general this is a pleasant elderly female in no distress sitting comfortably in her chair.  Her skin is warm and dry.--Has numerous  seborrheic keratosis  Eyes-right eye blurring conjunctivae has some erythema and apparently there's some history of exudate  do not appreciate that this afternoon-pupil appears to be reactive visual acuity intact--however right eye does appear to be irritated erythematous  Oropharynx clear mucous membranes moist.  Chest she has shallow air entry but no labored breathing --a minimal amount of wheezing ----this sounds baseline    Heart is regular rate and rhythm with occasional irregular beat with a 2/6 systolic murmur.  She does have significant lower extremity edema  2-3+ she does have some history of venous stasis changes --- edema appears somewhat increased extending up to into her thighs again she has significant venous stasis changes--he and evidence of DVT this is cool to touch pedal pulse is palpable bilaterally  Muscle skeletal does move all extremities 4 ambulates in a wheelchair do not note any deformities this appears to be baseline    Neurologic is grossly intact her speech is clear no overt lateralizing findings  Psych she is oriented to self is pleasant and appropriate.  Labs  09/17/2014.  WBC 7.4 hemoglobin 12.1 platelets 224.  Sodium 139 potassium 4.1 BUN 52 creatinine 1.8.  Liver function tests within normal limits albumin was 4.1  07/25/2014.  WB C7.7 hemoglobin 10.1 platelets 238.  Sodium 142 potassium 3.5 BUN 31 creatinine 1.47 CO2 level 29  \Iron 55-total iron binding capacity 418-vitamin B12 390-folate 14-ferritin 94  07/21/2014.  Cholesterol 78-triglycerides 140-HDL 23-LDL 27.  Hemoglobin A1c 7.2.  07/21/2014-significant for hemoglobin of 8.9-creatinine of 1.68  -liver function tests within normal limits except albumin of 3.2  .  06/22/2014.  Sodium 137 potassium 4 BUN 46 creatinine 1.93.  06/14/2014.  WBC 10.9 hemoglobin 10.7 platelets 270.  Sodium 136 potassium 4.3 BUN 54 creatinine 2.21.  Assessment and plan.  #1-history of  weight gain--diastolic CHF-she does appear to have some weight gain and edema-this was discussed with Dr. Dellia Nims via phone and will increase her Demadex to 80 mg twice a day-will update labs tomorrow-also will check a chest x-ray with history of wheezing-increased edema     will update a metabolic panelwith her history of renal insufficiency--also will update a metabolic panel in a week to ensure stability. She also continues on potassium supplementation  #2 hypertension-does appear to have some systolic elevations-but these do not appear to be consistent at this point will monitor she is on Lopressor as well as Norvasc which was recently increased   #3-history renal insufficiency-please see #1 this appears to be relatively baselin but will have to keep an eye on this with her diuretics.  #4-history of COPD-she does continue on when necessary nebulizers as well as her inhaler-at this point appears relatively stable--she also continues on Mucinex--she does have some wheezing Will get an x-ray   #5-history of anemia-I suspect this is due to her chronic renal insufficiency-will update a CBC as well--. Recent hemoglobin showed improvement  #6-history of depression this appears to be stable she is on Paxil.  #7-history diabetes type 2 she is on low-dose Lantus as well as a sliding scale--CBGs appear to be relatively satisfactory-hemoglobin A1c recently was 7.3 we'll update this   #  8-history of gout she is on allopurinol there've been no recent flares to my knowledge.  #9-history of hyperlipidemia she is on simvastatin --this appears relatively stable.  #10-history GERD she continues on ranitidine this is been r stable for some time  #11 conjunctivitis right eye-will treat with Cipro ophthalmologic solution 0.3% 2 drops 3 times a day for 7 days and monitor  [id number]-of note greater than 35 minutes spent assessing patient-discussing her status with nursing staff-reviewing her chart-and  coordinating and formulating a plan of care for numerous diagnoses-of note greater than 50% of time spent coordinating plan of care   .    Marland Kitchen

## 2014-10-15 ENCOUNTER — Inpatient Hospital Stay (HOSPITAL_COMMUNITY)
Admit: 2014-10-15 | Discharge: 2014-10-15 | Disposition: A | Discharge status: Home / Self Care | Payer: Medicare Other | Attending: Internal Medicine | Admitting: Internal Medicine

## 2014-10-15 ENCOUNTER — Encounter (HOSPITAL_COMMUNITY)
Admission: RE | Admit: 2014-10-15 | Discharge: 2014-10-15 | Disposition: A | Discharge status: Home / Self Care | Payer: Medicare Other | Source: Skilled Nursing Facility | Attending: Internal Medicine | Admitting: Internal Medicine

## 2014-10-15 DIAGNOSIS — E876 Hypokalemia: Secondary | ICD-10-CM | POA: Insufficient documentation

## 2014-10-15 DIAGNOSIS — E119 Type 2 diabetes mellitus without complications: Secondary | ICD-10-CM | POA: Diagnosis not present

## 2014-10-15 DIAGNOSIS — I10 Essential (primary) hypertension: Secondary | ICD-10-CM | POA: Diagnosis not present

## 2014-10-15 LAB — CBC WITH DIFFERENTIAL/PLATELET
Basophils Absolute: 0.1 10*3/uL (ref 0.0–0.1)
Basophils Relative: 1 % (ref 0–1)
Eosinophils Absolute: 0.4 10*3/uL (ref 0.0–0.7)
Eosinophils Relative: 5 % (ref 0–5)
HEMATOCRIT: 35.1 % — AB (ref 36.0–46.0)
Hemoglobin: 11.2 g/dL — ABNORMAL LOW (ref 12.0–15.0)
Lymphocytes Relative: 21 % (ref 12–46)
Lymphs Abs: 1.6 10*3/uL (ref 0.7–4.0)
MCH: 28.6 pg (ref 26.0–34.0)
MCHC: 31.9 g/dL (ref 30.0–36.0)
MCV: 89.5 fL (ref 78.0–100.0)
MONOS PCT: 7 % (ref 3–12)
Monocytes Absolute: 0.6 10*3/uL (ref 0.1–1.0)
Neutro Abs: 5.3 10*3/uL (ref 1.7–7.7)
Neutrophils Relative %: 66 % (ref 43–77)
PLATELETS: 202 10*3/uL (ref 150–400)
RBC: 3.92 MIL/uL (ref 3.87–5.11)
RDW: 16.7 % — ABNORMAL HIGH (ref 11.5–15.5)
WBC: 7.9 10*3/uL (ref 4.0–10.5)

## 2014-10-15 LAB — BASIC METABOLIC PANEL
Anion gap: 7 (ref 5–15)
BUN: 54 mg/dL — AB (ref 6–23)
CALCIUM: 8.9 mg/dL (ref 8.4–10.5)
CO2: 29 mmol/L (ref 19–32)
Chloride: 104 mmol/L (ref 96–112)
Creatinine, Ser: 1.63 mg/dL — ABNORMAL HIGH (ref 0.50–1.10)
GFR calc Af Amer: 31 mL/min — ABNORMAL LOW (ref >90)
GFR calc non Af Amer: 27 mL/min — ABNORMAL LOW (ref >90)
Glucose, Bld: 231 mg/dL — ABNORMAL HIGH (ref 70–99)
POTASSIUM: 3.9 mmol/L (ref 3.5–5.1)
Sodium: 140 mmol/L (ref 135–145)

## 2014-10-15 LAB — GLUCOSE, CAPILLARY
GLUCOSE-CAPILLARY: 221 mg/dL — AB (ref 70–99)
Glucose-Capillary: 162 mg/dL — ABNORMAL HIGH (ref 70–99)
Glucose-Capillary: 160 mg/dL — ABNORMAL HIGH (ref 70–99)
Glucose-Capillary: 154 mg/dL — ABNORMAL HIGH (ref 70–99)

## 2014-10-16 LAB — GLUCOSE, CAPILLARY
GLUCOSE-CAPILLARY: 137 mg/dL — AB (ref 70–99)
GLUCOSE-CAPILLARY: 221 mg/dL — AB (ref 70–99)
GLUCOSE-CAPILLARY: 170 mg/dL — AB (ref 70–99)
GLUCOSE-CAPILLARY: 191 mg/dL — AB (ref 70–99)

## 2014-10-17 LAB — GLUCOSE, CAPILLARY
GLUCOSE-CAPILLARY: 132 mg/dL — AB (ref 70–99)
GLUCOSE-CAPILLARY: 188 mg/dL — AB (ref 70–99)
Glucose-Capillary: 144 mg/dL — ABNORMAL HIGH (ref 70–99)
Glucose-Capillary: 247 mg/dL — ABNORMAL HIGH (ref 70–99)

## 2014-10-17 LAB — HEMOGLOBIN A1C
Hgb A1c MFr Bld: 8.1 % — ABNORMAL HIGH (ref 4.8–5.6)
Mean Plasma Glucose: 186 mg/dL

## 2014-10-18 LAB — GLUCOSE, CAPILLARY
Glucose-Capillary: 170 mg/dL — ABNORMAL HIGH (ref 70–99)
Glucose-Capillary: 226 mg/dL — ABNORMAL HIGH (ref 70–99)
Glucose-Capillary: 194 mg/dL — ABNORMAL HIGH (ref 70–99)
Glucose-Capillary: 162 mg/dL — ABNORMAL HIGH (ref 70–99)

## 2014-10-19 ENCOUNTER — Other Ambulatory Visit (HOSPITAL_COMMUNITY)
Admission: AD | Admit: 2014-10-19 | Discharge: 2014-10-19 | Disposition: A | Discharge status: Home / Self Care | Payer: Medicare Other | Source: Skilled Nursing Facility | Attending: Internal Medicine | Admitting: Internal Medicine

## 2014-10-19 ENCOUNTER — Non-Acute Institutional Stay (SKILLED_NURSING_FACILITY): Payer: Medicare Other | Admitting: Internal Medicine

## 2014-10-19 DIAGNOSIS — E876 Hypokalemia: Secondary | ICD-10-CM | POA: Diagnosis not present

## 2014-10-19 DIAGNOSIS — N184 Chronic kidney disease, stage 4 (severe): Secondary | ICD-10-CM

## 2014-10-19 DIAGNOSIS — R635 Abnormal weight gain: Secondary | ICD-10-CM

## 2014-10-19 DIAGNOSIS — I5033 Acute on chronic diastolic (congestive) heart failure: Secondary | ICD-10-CM

## 2014-10-19 LAB — PROTEIN / CREATININE RATIO, URINE
Creatinine, Urine: 56.27 mg/dL
Protein Creatinine Ratio: 0.25 — ABNORMAL HIGH (ref 0.00–0.15)
Total Protein, Urine: 14 mg/dL

## 2014-10-19 LAB — GLUCOSE, CAPILLARY
GLUCOSE-CAPILLARY: 149 mg/dL — AB (ref 70–99)
GLUCOSE-CAPILLARY: 199 mg/dL — AB (ref 70–99)
Glucose-Capillary: 151 mg/dL — ABNORMAL HIGH (ref 70–99)
Glucose-Capillary: 163 mg/dL — ABNORMAL HIGH (ref 70–99)

## 2014-10-20 ENCOUNTER — Other Ambulatory Visit (HOSPITAL_COMMUNITY)
Admission: AD | Admit: 2014-10-20 | Discharge: 2014-10-20 | Disposition: A | Discharge status: Home / Self Care | Payer: Medicare Other | Source: Skilled Nursing Facility | Attending: Internal Medicine | Admitting: Internal Medicine

## 2014-10-20 LAB — GLUCOSE, CAPILLARY
GLUCOSE-CAPILLARY: 155 mg/dL — AB (ref 70–99)
GLUCOSE-CAPILLARY: 229 mg/dL — AB (ref 70–99)
GLUCOSE-CAPILLARY: 143 mg/dL — AB (ref 70–99)
GLUCOSE-CAPILLARY: 206 mg/dL — AB (ref 70–99)

## 2014-10-21 ENCOUNTER — Encounter (HOSPITAL_COMMUNITY)
Admission: RE | Admit: 2014-10-21 | Discharge: 2014-10-21 | Disposition: A | Discharge status: Home / Self Care | Payer: Medicare Other | Source: Skilled Nursing Facility | Attending: Internal Medicine | Admitting: Internal Medicine

## 2014-10-21 DIAGNOSIS — E876 Hypokalemia: Secondary | ICD-10-CM | POA: Diagnosis not present

## 2014-10-21 DIAGNOSIS — I10 Essential (primary) hypertension: Secondary | ICD-10-CM | POA: Diagnosis not present

## 2014-10-21 DIAGNOSIS — E119 Type 2 diabetes mellitus without complications: Secondary | ICD-10-CM | POA: Diagnosis present

## 2014-10-21 LAB — BASIC METABOLIC PANEL
Anion gap: 10 (ref 5–15)
BUN: 45 mg/dL — ABNORMAL HIGH (ref 6–23)
CHLORIDE: 100 mmol/L (ref 96–112)
CO2: 30 mmol/L (ref 19–32)
CREATININE: 1.56 mg/dL — AB (ref 0.50–1.10)
Calcium: 9 mg/dL (ref 8.4–10.5)
GFR calc non Af Amer: 28 mL/min — ABNORMAL LOW (ref >90)
GFR, EST AFRICAN AMERICAN: 33 mL/min — AB (ref >90)
Glucose, Bld: 153 mg/dL — ABNORMAL HIGH (ref 70–99)
POTASSIUM: 3.1 mmol/L — AB (ref 3.5–5.1)
Sodium: 140 mmol/L (ref 135–145)

## 2014-10-21 LAB — GLUCOSE, CAPILLARY
GLUCOSE-CAPILLARY: 152 mg/dL — AB (ref 70–99)
GLUCOSE-CAPILLARY: 150 mg/dL — AB (ref 70–99)
GLUCOSE-CAPILLARY: 177 mg/dL — AB (ref 70–99)
Glucose-Capillary: 206 mg/dL — ABNORMAL HIGH (ref 70–99)

## 2014-10-22 LAB — GLUCOSE, CAPILLARY
GLUCOSE-CAPILLARY: 167 mg/dL — AB (ref 70–99)
GLUCOSE-CAPILLARY: 161 mg/dL — AB (ref 70–99)
Glucose-Capillary: 240 mg/dL — ABNORMAL HIGH (ref 70–99)
Glucose-Capillary: 172 mg/dL — ABNORMAL HIGH (ref 70–99)

## 2014-10-23 LAB — GLUCOSE, CAPILLARY
GLUCOSE-CAPILLARY: 149 mg/dL — AB (ref 70–99)
GLUCOSE-CAPILLARY: 259 mg/dL — AB (ref 70–99)
Glucose-Capillary: 206 mg/dL — ABNORMAL HIGH (ref 70–99)
Glucose-Capillary: 159 mg/dL — ABNORMAL HIGH (ref 70–99)

## 2014-10-23 LAB — BASIC METABOLIC PANEL
Anion gap: 7 (ref 5–15)
BUN: 61 mg/dL — ABNORMAL HIGH (ref 6–23)
CALCIUM: 9.1 mg/dL (ref 8.4–10.5)
CO2: 33 mmol/L — ABNORMAL HIGH (ref 19–32)
Chloride: 97 mmol/L (ref 96–112)
Creatinine, Ser: 1.89 mg/dL — ABNORMAL HIGH (ref 0.50–1.10)
GFR calc Af Amer: 26 mL/min — ABNORMAL LOW (ref >90)
GFR, EST NON AFRICAN AMERICAN: 22 mL/min — AB (ref >90)
Glucose, Bld: 228 mg/dL — ABNORMAL HIGH (ref 70–99)
POTASSIUM: 3.3 mmol/L — AB (ref 3.5–5.1)
Sodium: 137 mmol/L (ref 135–145)

## 2014-10-24 ENCOUNTER — Encounter (HOSPITAL_COMMUNITY)
Admission: RE | Admit: 2014-10-24 | Discharge: 2014-10-24 | Disposition: A | Discharge status: Home / Self Care | Payer: Medicare Other | Source: Skilled Nursing Facility | Attending: Internal Medicine | Admitting: Internal Medicine

## 2014-10-24 DIAGNOSIS — E119 Type 2 diabetes mellitus without complications: Secondary | ICD-10-CM | POA: Diagnosis not present

## 2014-10-24 LAB — COMPREHENSIVE METABOLIC PANEL
ALT: 12 U/L (ref 0–35)
AST: 19 U/L (ref 0–37)
Albumin: 3.5 g/dL (ref 3.5–5.2)
Alkaline Phosphatase: 91 U/L (ref 39–117)
Anion gap: 10 (ref 5–15)
BUN: 67 mg/dL — ABNORMAL HIGH (ref 6–23)
CALCIUM: 8.9 mg/dL (ref 8.4–10.5)
CHLORIDE: 96 mmol/L (ref 96–112)
CO2: 32 mmol/L (ref 19–32)
Creatinine, Ser: 1.79 mg/dL — ABNORMAL HIGH (ref 0.50–1.10)
GFR calc Af Amer: 28 mL/min — ABNORMAL LOW (ref >90)
GFR calc non Af Amer: 24 mL/min — ABNORMAL LOW (ref >90)
GLUCOSE: 168 mg/dL — AB (ref 70–99)
Potassium: 3.1 mmol/L — ABNORMAL LOW (ref 3.5–5.1)
Sodium: 138 mmol/L (ref 135–145)
Total Bilirubin: 0.8 mg/dL (ref 0.3–1.2)
Total Protein: 6.7 g/dL (ref 6.0–8.3)

## 2014-10-24 LAB — GLUCOSE, CAPILLARY
GLUCOSE-CAPILLARY: 230 mg/dL — AB (ref 70–99)
GLUCOSE-CAPILLARY: 166 mg/dL — AB (ref 70–99)
Glucose-Capillary: 168 mg/dL — ABNORMAL HIGH (ref 70–99)
Glucose-Capillary: 199 mg/dL — ABNORMAL HIGH (ref 70–99)

## 2014-10-24 NOTE — Progress Notes (Addendum)
Patient ID: [name], female   DOB: 1922/05/05, 81 y.o.   MRN: [id number]                PROGRESS NOTE  DATE:  10/19/2014                  FACILITY: [facility]                        LEVEL OF CARE:   SNF   Acute Visit   CHIEF COMPLAINT:  Weight gain.    HISTORY OF PRESENT ILLNESS:  I have again been asked to look at this patient with regards to this issue.  I had last seen her on 09/17/2014, at which time her weight was 219 pounds.  This has now climbed up to 229.6 pounds.    Previous echocardiogram in April of last year showed normal left ventricular function, mildly dilated right ventricle, significant pulmonary hypertension, grade 1 diastolic dysfunction, and some degree of mitral stenosis.    She does have chronic renal failure with her most recent BUN and creatinine at 52 and 1.81, respectively, on 09/17/2014.    CURRENT MEDICATIONS:  Medication list is reviewed.    Demadex 80 b.i.d.      She is not on any other cardiac medications.    REVIEW OF SYSTEMS:    GENERAL:  As noted, her weight has gone from 219 pounds on 09/18/2014 to 229 pounds on 10/14/2014.  Since then, she has generally remained at the 230 range.    CHEST/RESPIRATORY:  No excessive shortness of breath.     CARDIAC:  She complains bitterly about her edema.    PHYSICAL EXAMINATION:   CHEST/RESPIRATORY:  Totally clear air entry bilaterally.    CARDIOVASCULAR:   CARDIAC:  There is a 2/6 midsystolic murmur.   Her jugular venous pressure is not elevated.      EDEMA/VARICOSITIES:  She now has fairly gross coccyx edema (was scant when I last saw her a month ago).  She has venous stasis with edema extending well above her thighs, clearly not all venous stasis.      ASSESSMENT/PLAN:                   Right greater than left congestive heart failure.  Complicated by chronic renal failure.  I will check her for proteinuria.  In the meantime, I am going to add an every-other-day dose of Demadex  to see if we can get this down.  We are going to need to augment her potassium.  Surprisingly, she is not currently on any potassium.

## 2014-10-25 LAB — GLUCOSE, CAPILLARY
GLUCOSE-CAPILLARY: 178 mg/dL — AB (ref 70–99)
GLUCOSE-CAPILLARY: 177 mg/dL — AB (ref 70–99)
Glucose-Capillary: 167 mg/dL — ABNORMAL HIGH (ref 70–99)

## 2014-10-26 LAB — GLUCOSE, CAPILLARY: Glucose-Capillary: 161 mg/dL — ABNORMAL HIGH (ref 70–99)

## 2014-10-28 ENCOUNTER — Encounter: Payer: Self-pay | Admitting: Internal Medicine

## 2014-10-28 ENCOUNTER — Non-Acute Institutional Stay (SKILLED_NURSING_FACILITY): Payer: Medicare Other | Admitting: Internal Medicine

## 2014-10-28 ENCOUNTER — Encounter (HOSPITAL_COMMUNITY)
Admission: RE | Admit: 2014-10-28 | Discharge: 2014-10-28 | Disposition: A | Discharge status: Home / Self Care | Payer: No Typology Code available for payment source | Source: Ambulatory Visit | Attending: Internal Medicine | Admitting: Internal Medicine

## 2014-10-28 DIAGNOSIS — I10 Essential (primary) hypertension: Secondary | ICD-10-CM | POA: Insufficient documentation

## 2014-10-28 DIAGNOSIS — R635 Abnormal weight gain: Secondary | ICD-10-CM

## 2014-10-28 DIAGNOSIS — N289 Disorder of kidney and ureter, unspecified: Secondary | ICD-10-CM

## 2014-10-28 DIAGNOSIS — E876 Hypokalemia: Secondary | ICD-10-CM | POA: Insufficient documentation

## 2014-10-28 DIAGNOSIS — E119 Type 2 diabetes mellitus without complications: Secondary | ICD-10-CM | POA: Insufficient documentation

## 2014-10-28 DIAGNOSIS — I5032 Chronic diastolic (congestive) heart failure: Secondary | ICD-10-CM

## 2014-10-28 LAB — BASIC METABOLIC PANEL
ANION GAP: 10 (ref 5–15)
BUN: 91 mg/dL — ABNORMAL HIGH (ref 6–23)
CHLORIDE: 92 mmol/L — AB (ref 96–112)
CO2: 32 mmol/L (ref 19–32)
CREATININE: 2.4 mg/dL — AB (ref 0.50–1.10)
Calcium: 8.7 mg/dL (ref 8.4–10.5)
GFR calc Af Amer: 19 mL/min — ABNORMAL LOW (ref >90)
GFR calc non Af Amer: 17 mL/min — ABNORMAL LOW (ref >90)
GLUCOSE: 228 mg/dL — AB (ref 70–99)
Potassium: 3 mmol/L — ABNORMAL LOW (ref 3.5–5.1)
Sodium: 134 mmol/L — ABNORMAL LOW (ref 135–145)

## 2014-10-28 NOTE — Progress Notes (Signed)
Patient ID: [name], female   DOB: [birth year], 81 y.o.   MRN: [id number]   FACILITY: [facility]:   SNF  This is an acute visit  Chief complaint Acute Visit--secondary to hypokalemia renal insufficiency-follow-up weight gain  .    HISTORY OF PRESENT ILLNESS: Patient is a pleasant 81 year old female recently has gained a significant amount await up to over 229 pounds-.    Previous echocardiogram in April of last year showed normal left ventricular function, mildly dilated right ventricle, significant pulmonary hypertension, grade 1 diastolic dysfunction, and some degree of mitral stenosis.    She does have chronic renal failure--baseline creatinine appears to be in the high ones occasionally low twos-on lab done today creatinine is 2.4 with a BUN of 91-on February 21 BUN was 61 creatinine 1.89-on February 19 creatinine was 1.56 BUN 45.  We've been quite aggressive with her diuretics secondary to weight gain Dr. Dellia Nims did start her recently on metolazone 2.5 mg every other day she is also on Demadex 80 mg twice a day.  We've been monitoring her metabolic panel and she recently has developed hypokalemia with a creatinine of 3.1 on February 19 we aggressively supplemented this with potassium largely 40 mEq twice a day of with an occasional 60 mEq dose-he did rise to 3.3 on February 21 however now has fallen 3.0 on lab done today-most recent dose was 40 mEq twice a day.  Her weight appears to be significantly reduced however currently 213.2 weight was 224.6 one week ago again she had been up to 229 on February 12.  She does not complain of any shortness of breath or chest pain says she feels Connell week tonight but apparently at this time the evening this is somewhat of a chronic complaint.  Her vital signs are stable blood pressure somewhat variable I got 156/70 tonight recently 135/62 this will have to be monitored-she is on Norvasc  10 mg a day as well as Lopressor 50 mg twice a day in addition to the diuretics.      CURRENT MEDICATIONS:  Medication list is reviewed.    Demadex 80 b.i.d as well as metolazone 2.5 mg every other day-she is currently on potassium 40 mEq twice a day.      She is not on any other cardiac medications.  Other than Lopressor and Norvasc  REVIEW OF SYSTEMS:    Limited secondary to patient being poor historian--says she feels weak at times GENERAL: Does not complain of fever or chills her weight appears to be trending down      CHEST/RESPIRATORY:  No excessive shortness of breath.     CARDIAC:  Does not complain of chest pain appears edema is somewhat improved.  GI is not complaining of any nausea vomiting abdominal discomfort.  Muscle skeletal is not complaining of joint pain she has complained of edema in her legs previously.  Neurologic does not complain of dizziness or headache.      PHYSICAL EXAMINATION:  She is afebrile pulse 54 respirations 20 blood pressure 156/70 manually again there is some variability as noted above.  In general this is a pleasant elderly female in no distress sitting comfortably in her chair.  Her skin is warm and dry.  Oropharynx clear mucous membranes moist.  Chest --there is some minimal wheezing at the bases on expiration  there is no labored breathing  .    CARDIOVASCULAR:   CARDIAC:  There is a 2/6 midsystolic murmur.   Her jugular venous pressure is not elevated. --She is slightly bradycardic     EDEMA/VARICOSITIES:  S Continues with fairly significant lower extremity edema however this appears to be somewhat improved from previous exams compression hose are intact.  Muscle skeletal moves all extremities 4 with baseline lower extremity weakness.  Neurologic is grossly intact her speech is clear no lateralizing findings cranial nerves intact her speech is clear.  Psych continues to be oriented to self is pleasant and appropriate--follow simple  verbal commands without difficulty      Labs.  10/28/2014.  Sodium 134 potassium 3.0 BUN 91 creatinine 2.44.  10/23/2014.  Sodium 137 potassium 3.3 BUN 61 creatinine 1.89.  10/15/2014.  WBC 7.9 hemoglobin 11.2 platelets 202   ASSESSMENT/PLAN:                   Right greater than left congestive heart failure--this appears to have stabilized somewhat with aggressive diuretic regimen-creatinine is going up however.    Since she has had it appears significant weight loss of over 15 pounds will discontinue the metolazone hopefully this will have a beneficial effect on her renal function as well.    Also will reduce her Demadex to 60 mg twice a day-her weights will have to be monitored closely here.                                    Will updat  BMPe a first laboratory day next week Monday, February 29-.   Marland Kitchen  Hypokalemia-this is significantly low will have to be very aggressive here will increase her potassium to 60 mEq twice a day and again recheck a basic metabolic panel on Monday clinically she appears to be stable-I do note metolazone can increase potassium but obviously her potassium nonetheless is trending down.  #3-weakness-this does not appear to be an acute complaint will have  monitor vital signs pulse ox every shift for now for 48 hours to keep an eye on this-physical exam was essentially baseline.  [id number]

## 2014-10-31 ENCOUNTER — Non-Acute Institutional Stay (SKILLED_NURSING_FACILITY): Payer: Medicare Other | Admitting: Internal Medicine

## 2014-10-31 ENCOUNTER — Encounter (HOSPITAL_COMMUNITY)
Admission: RE | Admit: 2014-10-31 | Discharge: 2014-10-31 | Disposition: A | Discharge status: Home / Self Care | Payer: Medicare Other | Source: Skilled Nursing Facility | Attending: Internal Medicine | Admitting: Internal Medicine

## 2014-10-31 DIAGNOSIS — N184 Chronic kidney disease, stage 4 (severe): Secondary | ICD-10-CM | POA: Diagnosis not present

## 2014-10-31 DIAGNOSIS — I5033 Acute on chronic diastolic (congestive) heart failure: Secondary | ICD-10-CM | POA: Diagnosis not present

## 2014-10-31 DIAGNOSIS — E119 Type 2 diabetes mellitus without complications: Secondary | ICD-10-CM | POA: Diagnosis not present

## 2014-10-31 LAB — BASIC METABOLIC PANEL
Anion gap: 13 (ref 5–15)
BUN: 91 mg/dL — AB (ref 6–23)
CHLORIDE: 93 mmol/L — AB (ref 96–112)
CO2: 30 mmol/L (ref 19–32)
CREATININE: 1.88 mg/dL — AB (ref 0.50–1.10)
Calcium: 9 mg/dL (ref 8.4–10.5)
GFR calc non Af Amer: 22 mL/min — ABNORMAL LOW (ref >90)
GFR, EST AFRICAN AMERICAN: 26 mL/min — AB (ref >90)
Glucose, Bld: 170 mg/dL — ABNORMAL HIGH (ref 70–99)
Potassium: 2.8 mmol/L — ABNORMAL LOW (ref 3.5–5.1)
Sodium: 136 mmol/L (ref 135–145)

## 2014-11-01 NOTE — Progress Notes (Addendum)
Patient ID: [name], female   DOB: Apr 25, 1922, 81 y.o.   MRN: [id number]                PROGRESS NOTE  DATE:  10/31/2014                 FACILITY: [facility]         LEVEL OF CARE:   SNF   Acute Visit                                  HISTORY OF PRESENT ILLNESS:  This is a patient whom I last saw on 10/19/2014.  She had had a marked increase in her edema, gaining 10 pounds in a short period of time.  Previous echocardiogram in April 2015 showed normal left ventricular function, mildly dilated right ventricle, and significant pulmonary hypertension.    She does have significant COPD.    She also has chronic renal insufficiency with a baseline creatinine of around 1.8.    She was on Demadex 80 b.i.d.  I added Zaroxolyn at 2.5 every other day on 10/19/2014.  I see that this was stopped on 10/28/2014 and Demadex dropped to 60 b.i.d.  She has lost 14 pounds, with her weight on 10/28/2014 at 213 pounds.    LABORATORY DATA:  Lab work from today shows a BUN of 91 (up from a baseline of around 45).  Her creatinine is 1.88.  This is higher; however, not terribly so at 1.88.  Her potassium, however, is 2.8.   REVIEW OF SYSTEMS:    CHEST/RESPIRATORY:  She is not complaining of shortness of breath.   CARDIAC:  No chest pain.  She has noted much less edema in her legs.   GI:  No abdominal pain.      PHYSICAL EXAMINATION:   VITAL SIGNS:   RESPIRATIONS:    20.     PULSE:   60.   BLOOD PRESSURE:  145/42.      GENERAL APPEARANCE:  The patient is not in any distress.    CHEST/RESPIRATORY:  Shallow air entry and mild expiratory wheezing.  However, her work of breathing seems normal.  There is no accessory muscle use.   CARDIOVASCULAR:   CARDIAC:  Reasonably harsh pansystolic murmur at the lower left sternal border.  Her JVP is not elevated.  In fact, if anything, she looks somewhat volume-contracted.    ASSESSMENT/PLAN:                                   Right heart  failure.   Whether there is a change in her cardiac function from the last echo in April, I am not certain.  She is now on Demadex 60 b.i.d.  Some of this is a known complication of using two different potent diuretics.  However, we have managed to get some fluid off her.  We will watch for the recovery off the metolazone.  It is likely she will require more Demadex.    Profound hypokalemia.  This has been increased to 40 mEq t.i.d.  I will increase this to 60.

## 2014-11-02 ENCOUNTER — Encounter (HOSPITAL_COMMUNITY)
Admission: RE | Admit: 2014-11-02 | Discharge: 2014-11-02 | Disposition: A | Discharge status: Home / Self Care | Payer: Medicare Other | Source: Skilled Nursing Facility | Attending: Internal Medicine | Admitting: Internal Medicine

## 2014-11-02 DIAGNOSIS — I10 Essential (primary) hypertension: Secondary | ICD-10-CM | POA: Insufficient documentation

## 2014-11-02 DIAGNOSIS — I5032 Chronic diastolic (congestive) heart failure: Secondary | ICD-10-CM | POA: Diagnosis not present

## 2014-11-02 DIAGNOSIS — E1121 Type 2 diabetes mellitus with diabetic nephropathy: Secondary | ICD-10-CM | POA: Diagnosis not present

## 2014-11-02 LAB — BASIC METABOLIC PANEL
Anion gap: 11 (ref 5–15)
BUN: 82 mg/dL — AB (ref 6–23)
CO2: 30 mmol/L (ref 19–32)
CREATININE: 1.81 mg/dL — AB (ref 0.50–1.10)
Calcium: 9.2 mg/dL (ref 8.4–10.5)
Chloride: 99 mmol/L (ref 96–112)
GFR calc Af Amer: 27 mL/min — ABNORMAL LOW (ref >90)
GFR calc non Af Amer: 23 mL/min — ABNORMAL LOW (ref >90)
GLUCOSE: 174 mg/dL — AB (ref 70–99)
Potassium: 3.1 mmol/L — ABNORMAL LOW (ref 3.5–5.1)
Sodium: 140 mmol/L (ref 135–145)

## 2014-11-02 LAB — MAGNESIUM: Magnesium: 2 mg/dL (ref 1.5–2.5)

## 2014-11-04 ENCOUNTER — Encounter (HOSPITAL_COMMUNITY)
Admission: RE | Admit: 2014-11-04 | Discharge: 2014-11-04 | Disposition: A | Discharge status: Home / Self Care | Payer: Medicare Other | Source: Skilled Nursing Facility | Attending: Internal Medicine | Admitting: Internal Medicine

## 2014-11-04 ENCOUNTER — Other Ambulatory Visit (HOSPITAL_COMMUNITY)
Admission: RE | Admit: 2014-11-04 | Discharge: 2014-11-04 | Disposition: A | Discharge status: Home / Self Care | Payer: Medicare Other | Source: Other Acute Inpatient Hospital | Attending: Internal Medicine | Admitting: Internal Medicine

## 2014-11-04 DIAGNOSIS — R300 Dysuria: Secondary | ICD-10-CM | POA: Insufficient documentation

## 2014-11-04 LAB — URINALYSIS, ROUTINE W REFLEX MICROSCOPIC
Bilirubin Urine: NEGATIVE
GLUCOSE, UA: NEGATIVE mg/dL
Ketones, ur: NEGATIVE mg/dL
Nitrite: NEGATIVE
PROTEIN: NEGATIVE mg/dL
SPECIFIC GRAVITY, URINE: 1.015 (ref 1.005–1.030)
Urobilinogen, UA: 0.2 mg/dL (ref 0.0–1.0)
pH: 5.5 (ref 5.0–8.0)

## 2014-11-04 LAB — URINE MICROSCOPIC-ADD ON

## 2014-11-07 ENCOUNTER — Encounter (HOSPITAL_COMMUNITY)
Admission: RE | Admit: 2014-11-07 | Discharge: 2014-11-07 | Disposition: A | Discharge status: Home / Self Care | Payer: Medicare Other | Source: Skilled Nursing Facility | Attending: Internal Medicine | Admitting: Internal Medicine

## 2014-11-07 ENCOUNTER — Non-Acute Institutional Stay (SKILLED_NURSING_FACILITY): Payer: Medicare Other | Admitting: Internal Medicine

## 2014-11-07 DIAGNOSIS — E1121 Type 2 diabetes mellitus with diabetic nephropathy: Secondary | ICD-10-CM | POA: Diagnosis not present

## 2014-11-07 DIAGNOSIS — I5033 Acute on chronic diastolic (congestive) heart failure: Secondary | ICD-10-CM | POA: Diagnosis not present

## 2014-11-07 DIAGNOSIS — N184 Chronic kidney disease, stage 4 (severe): Secondary | ICD-10-CM

## 2014-11-07 DIAGNOSIS — I10 Essential (primary) hypertension: Secondary | ICD-10-CM | POA: Diagnosis not present

## 2014-11-07 LAB — BASIC METABOLIC PANEL
Anion gap: 8 (ref 5–15)
BUN: 59 mg/dL — ABNORMAL HIGH (ref 6–23)
CALCIUM: 9 mg/dL (ref 8.4–10.5)
CO2: 26 mmol/L (ref 19–32)
Chloride: 107 mmol/L (ref 96–112)
Creatinine, Ser: 1.6 mg/dL — ABNORMAL HIGH (ref 0.50–1.10)
GFR, EST AFRICAN AMERICAN: 32 mL/min — AB (ref >90)
GFR, EST NON AFRICAN AMERICAN: 27 mL/min — AB (ref >90)
Glucose, Bld: 148 mg/dL — ABNORMAL HIGH (ref 70–99)
Potassium: 4.5 mmol/L (ref 3.5–5.1)
SODIUM: 141 mmol/L (ref 135–145)

## 2014-11-07 LAB — URINE CULTURE

## 2014-11-09 ENCOUNTER — Other Ambulatory Visit (HOSPITAL_COMMUNITY)
Admission: AD | Admit: 2014-11-09 | Discharge: 2014-11-09 | Disposition: A | Discharge status: Home / Self Care | Payer: Medicare Other | Source: Skilled Nursing Facility | Attending: Internal Medicine | Admitting: Internal Medicine

## 2014-11-09 ENCOUNTER — Non-Acute Institutional Stay (SKILLED_NURSING_FACILITY): Payer: Medicare Other | Admitting: Internal Medicine

## 2014-11-09 DIAGNOSIS — E1121 Type 2 diabetes mellitus with diabetic nephropathy: Secondary | ICD-10-CM

## 2014-11-09 DIAGNOSIS — I5033 Acute on chronic diastolic (congestive) heart failure: Secondary | ICD-10-CM | POA: Diagnosis not present

## 2014-11-09 DIAGNOSIS — R300 Dysuria: Secondary | ICD-10-CM | POA: Diagnosis not present

## 2014-11-09 LAB — BASIC METABOLIC PANEL
Anion gap: 7 (ref 5–15)
BUN: 48 mg/dL — AB (ref 6–23)
CHLORIDE: 105 mmol/L (ref 96–112)
CO2: 26 mmol/L (ref 19–32)
Calcium: 8.6 mg/dL (ref 8.4–10.5)
Creatinine, Ser: 1.66 mg/dL — ABNORMAL HIGH (ref 0.50–1.10)
GFR, EST AFRICAN AMERICAN: 30 mL/min — AB (ref >90)
GFR, EST NON AFRICAN AMERICAN: 26 mL/min — AB (ref >90)
Glucose, Bld: 140 mg/dL — ABNORMAL HIGH (ref 70–99)
Potassium: 3.2 mmol/L — ABNORMAL LOW (ref 3.5–5.1)
SODIUM: 138 mmol/L (ref 135–145)

## 2014-11-11 NOTE — Progress Notes (Addendum)
Patient ID: [name], female   DOB: 03-22-1922, 81 y.o.   MRN: [id number]               PROGRESS NOTE  DATE:  11/07/2014          FACILITY: [facility]        LEVEL OF CARE:   SNF   Acute Visit   CHIEF COMPLAINT:  Review of potassium, weights, etc.    HISTORY OF PRESENT ILLNESS:  [name] is a patient that has chronic renal insufficiency.    She probably also has right heart failure and significant pulmonary hypertension.    It was noted in mid February that she had had a very significant weight gain.  We added Zaroxolyn 2.5 every other day on 10/19/2014.   This was in addition to her Demadex 80 b.i.d.  She has lost 14 pounds.  On 10/28/2014, she was down 13 pounds.  The metolazone was put on hold.  Her Demadex was dropped to 60 b.i.d.     We have been aggressively replacing her potassium and today this is up to 4.5.  Her BUN is 59 and creatinine is 1.6, which are actually stable, I think, for her.    In the meantime, she has been noted to be increasingly confused, disrobing in the middle of the hallway, etc.  A urine culture has been done showing greater than 100,000 E.coli.  She is allergic to penicillin and cephalosporins.        REVIEW OF SYSTEMS:       CHEST/RESPIRATORY:  No shortness of breath.  CARDIAC:   No chest pain.    GI:  No abdominal pain or diarrhea.             GU:  It is difficult to get her to comment on back pain, flank pain, or dysuria.                          PHYSICAL EXAMINATION:          GENERAL APPEARANCE:  The patient is not in any distress.     CHEST/RESPIRATORY:  Shallow, but otherwise clear air entry bilaterally.   CARDIOVASCULAR:  CARDIAC:  2/6 pansystolic murmur at the lower left sternal border.  JVP is not elevated.  She does not appear to be dehydrated.   GASTROINTESTINAL:  ABDOMEN:   There are no masses.  No tenderness.   GENITOURINARY:  BLADDER:   No clear suprapubic or costovertebral angle tenderness.     ASSESSMENT/PLAN:               Diastolic heart failure.  Her weight has been stable on the Demadex, today at 212 pounds.    Hypokalemia.  Her potassium is up to 4.5.  I am going to decrease her KCl to q.d.  Recheck a basic metabolic panel on Monday.    UTI?  Incipient delirium.  I have put her on Cipro 500 p.o. b.i.d. for five days.  I am not completely sure that this results in any of her mental status issues, however.    Renal insufficiency.  Her BUN today is 59, creatinine 1.6.  This has fluctuated quite a bit.  It was 2.4 on 10/28/2014.

## 2014-11-13 ENCOUNTER — Non-Acute Institutional Stay (SKILLED_NURSING_FACILITY): Payer: Medicare Other | Admitting: Internal Medicine

## 2014-11-13 ENCOUNTER — Encounter (HOSPITAL_COMMUNITY): Payer: Self-pay | Admitting: *Deleted

## 2014-11-13 ENCOUNTER — Inpatient Hospital Stay (HOSPITAL_COMMUNITY)
Admission: EM | Admit: 2014-11-13 | Discharge: 2014-11-15 | DRG: 070 | Disposition: A | Discharge status: Skilled Nursing Facility | Payer: Medicare Other | Attending: Internal Medicine | Admitting: Internal Medicine

## 2014-11-13 ENCOUNTER — Emergency Department (HOSPITAL_COMMUNITY): Payer: Medicare Other

## 2014-11-13 DIAGNOSIS — Z79899 Other long term (current) drug therapy: Secondary | ICD-10-CM | POA: Diagnosis not present

## 2014-11-13 DIAGNOSIS — H9190 Unspecified hearing loss, unspecified ear: Secondary | ICD-10-CM | POA: Diagnosis present

## 2014-11-13 DIAGNOSIS — E785 Hyperlipidemia, unspecified: Secondary | ICD-10-CM | POA: Diagnosis present

## 2014-11-13 DIAGNOSIS — I272 Other secondary pulmonary hypertension: Secondary | ICD-10-CM | POA: Diagnosis present

## 2014-11-13 DIAGNOSIS — R4182 Altered mental status, unspecified: Secondary | ICD-10-CM

## 2014-11-13 DIAGNOSIS — Z853 Personal history of malignant neoplasm of breast: Secondary | ICD-10-CM

## 2014-11-13 DIAGNOSIS — R001 Bradycardia, unspecified: Secondary | ICD-10-CM | POA: Diagnosis present

## 2014-11-13 DIAGNOSIS — N184 Chronic kidney disease, stage 4 (severe): Secondary | ICD-10-CM | POA: Diagnosis present

## 2014-11-13 DIAGNOSIS — I639 Cerebral infarction, unspecified: Secondary | ICD-10-CM | POA: Diagnosis present

## 2014-11-13 DIAGNOSIS — M1990 Unspecified osteoarthritis, unspecified site: Secondary | ICD-10-CM | POA: Diagnosis present

## 2014-11-13 DIAGNOSIS — Z66 Do not resuscitate: Secondary | ICD-10-CM | POA: Diagnosis present

## 2014-11-13 DIAGNOSIS — I129 Hypertensive chronic kidney disease with stage 1 through stage 4 chronic kidney disease, or unspecified chronic kidney disease: Secondary | ICD-10-CM | POA: Diagnosis present

## 2014-11-13 DIAGNOSIS — I5032 Chronic diastolic (congestive) heart failure: Secondary | ICD-10-CM | POA: Diagnosis present

## 2014-11-13 DIAGNOSIS — Z833 Family history of diabetes mellitus: Secondary | ICD-10-CM | POA: Diagnosis not present

## 2014-11-13 DIAGNOSIS — R410 Disorientation, unspecified: Secondary | ICD-10-CM | POA: Diagnosis not present

## 2014-11-13 DIAGNOSIS — E780 Pure hypercholesterolemia: Secondary | ICD-10-CM | POA: Diagnosis present

## 2014-11-13 DIAGNOSIS — Z825 Family history of asthma and other chronic lower respiratory diseases: Secondary | ICD-10-CM

## 2014-11-13 DIAGNOSIS — Z794 Long term (current) use of insulin: Secondary | ICD-10-CM

## 2014-11-13 DIAGNOSIS — G9340 Encephalopathy, unspecified: Secondary | ICD-10-CM | POA: Diagnosis not present

## 2014-11-13 DIAGNOSIS — Z8673 Personal history of transient ischemic attack (TIA), and cerebral infarction without residual deficits: Secondary | ICD-10-CM | POA: Diagnosis not present

## 2014-11-13 DIAGNOSIS — E1121 Type 2 diabetes mellitus with diabetic nephropathy: Secondary | ICD-10-CM | POA: Diagnosis present

## 2014-11-13 DIAGNOSIS — J45909 Unspecified asthma, uncomplicated: Secondary | ICD-10-CM | POA: Diagnosis present

## 2014-11-13 DIAGNOSIS — R531 Weakness: Secondary | ICD-10-CM

## 2014-11-13 DIAGNOSIS — Z7982 Long term (current) use of aspirin: Secondary | ICD-10-CM

## 2014-11-13 DIAGNOSIS — I10 Essential (primary) hypertension: Secondary | ICD-10-CM | POA: Diagnosis present

## 2014-11-13 DIAGNOSIS — K219 Gastro-esophageal reflux disease without esophagitis: Secondary | ICD-10-CM | POA: Diagnosis present

## 2014-11-13 DIAGNOSIS — Z96649 Presence of unspecified artificial hip joint: Secondary | ICD-10-CM | POA: Diagnosis present

## 2014-11-13 LAB — CBC WITH DIFFERENTIAL/PLATELET
BASOS PCT: 0 % (ref 0–1)
Basophils Absolute: 0 10*3/uL (ref 0.0–0.1)
EOS PCT: 5 % (ref 0–5)
Eosinophils Absolute: 0.4 10*3/uL (ref 0.0–0.7)
HEMATOCRIT: 36 % (ref 36.0–46.0)
Hemoglobin: 11.6 g/dL — ABNORMAL LOW (ref 12.0–15.0)
LYMPHS ABS: 1.6 10*3/uL (ref 0.7–4.0)
Lymphocytes Relative: 18 % (ref 12–46)
MCH: 28.6 pg (ref 26.0–34.0)
MCHC: 32.2 g/dL (ref 30.0–36.0)
MCV: 88.9 fL (ref 78.0–100.0)
Monocytes Absolute: 0.9 10*3/uL (ref 0.1–1.0)
Monocytes Relative: 10 % (ref 3–12)
NEUTROS ABS: 6 10*3/uL (ref 1.7–7.7)
NEUTROS PCT: 67 % (ref 43–77)
PLATELETS: 201 10*3/uL (ref 150–400)
RBC: 4.05 MIL/uL (ref 3.87–5.11)
RDW: 15.8 % — ABNORMAL HIGH (ref 11.5–15.5)
WBC: 9 10*3/uL (ref 4.0–10.5)

## 2014-11-13 LAB — URINALYSIS, ROUTINE W REFLEX MICROSCOPIC
Bilirubin Urine: NEGATIVE
Glucose, UA: NEGATIVE mg/dL
Ketones, ur: NEGATIVE mg/dL
LEUKOCYTES UA: NEGATIVE
NITRITE: NEGATIVE
PROTEIN: NEGATIVE mg/dL
Specific Gravity, Urine: 1.01 (ref 1.005–1.030)
Urobilinogen, UA: 0.2 mg/dL (ref 0.0–1.0)
pH: 5.5 (ref 5.0–8.0)

## 2014-11-13 LAB — GLUCOSE, CAPILLARY: Glucose-Capillary: 204 mg/dL — ABNORMAL HIGH (ref 70–99)

## 2014-11-13 LAB — I-STAT CG4 LACTIC ACID, ED: LACTIC ACID, VENOUS: 1.32 mmol/L (ref 0.5–2.0)

## 2014-11-13 LAB — URINE MICROSCOPIC-ADD ON

## 2014-11-13 LAB — BASIC METABOLIC PANEL
Anion gap: 9 (ref 5–15)
BUN: 51 mg/dL — AB (ref 6–23)
CALCIUM: 9.1 mg/dL (ref 8.4–10.5)
CO2: 23 mmol/L (ref 19–32)
CREATININE: 1.72 mg/dL — AB (ref 0.50–1.10)
Chloride: 105 mmol/L (ref 96–112)
GFR calc non Af Amer: 25 mL/min — ABNORMAL LOW (ref >90)
GFR, EST AFRICAN AMERICAN: 29 mL/min — AB (ref >90)
Glucose, Bld: 160 mg/dL — ABNORMAL HIGH (ref 70–99)
POTASSIUM: 4.2 mmol/L (ref 3.5–5.1)
SODIUM: 137 mmol/L (ref 135–145)

## 2014-11-13 LAB — CBC
HCT: 35.2 % — ABNORMAL LOW (ref 36.0–46.0)
Hemoglobin: 11.2 g/dL — ABNORMAL LOW (ref 12.0–15.0)
MCH: 28.1 pg (ref 26.0–34.0)
MCHC: 31.8 g/dL (ref 30.0–36.0)
MCV: 88.4 fL (ref 78.0–100.0)
Platelets: 204 10*3/uL (ref 150–400)
RBC: 3.98 MIL/uL (ref 3.87–5.11)
RDW: 15.8 % — ABNORMAL HIGH (ref 11.5–15.5)
WBC: 8.7 10*3/uL (ref 4.0–10.5)

## 2014-11-13 LAB — TROPONIN I: Troponin I: <0.03 ng/mL (ref <0.031)

## 2014-11-13 LAB — CREATININE, SERUM
Creatinine, Ser: 1.6 mg/dL — ABNORMAL HIGH (ref 0.50–1.10)
GFR calc Af Amer: 32 mL/min — ABNORMAL LOW (ref >90)
GFR, EST NON AFRICAN AMERICAN: 27 mL/min — AB (ref >90)

## 2014-11-13 LAB — TSH: TSH: 1.593 u[IU]/mL (ref 0.350–4.500)

## 2014-11-13 LAB — PROTIME-INR
INR: 1.06 (ref 0.00–1.49)
Prothrombin Time: 13.9 seconds (ref 11.6–15.2)

## 2014-11-13 LAB — ETHANOL: Alcohol, Ethyl (B): <5 mg/dL (ref 0–9)

## 2014-11-13 MED ORDER — INSULIN ASPART 100 UNIT/ML ~~LOC~~ SOLN
0.0000 [IU] | Freq: Three times a day (TID) | SUBCUTANEOUS | Status: DC
  Administered 2014-11-14: 3 [IU] via SUBCUTANEOUS
  Administered 2014-11-14 – 2014-11-15 (×2): 2 [IU] via SUBCUTANEOUS
  Administered 2014-11-15: 3 [IU] via SUBCUTANEOUS

## 2014-11-13 MED ORDER — BENZONATATE 100 MG PO CAPS: 100.0000 mg | ORAL_CAPSULE | Freq: Three times a day (TID) | ORAL | Status: DC | PRN

## 2014-11-13 MED ORDER — ONDANSETRON HCL 4 MG PO TABS: 4.0000 mg | ORAL_TABLET | Freq: Four times a day (QID) | ORAL | Status: DC | PRN

## 2014-11-13 MED ORDER — TORSEMIDE 20 MG PO TABS
80.0000 mg | ORAL_TABLET | Freq: Every day | ORAL | Status: DC
Start: 2014-11-13 — End: 2014-11-13

## 2014-11-13 MED ORDER — ENOXAPARIN SODIUM 30 MG/0.3ML ~~LOC~~ SOLN
30.0000 mg | SUBCUTANEOUS | Status: DC
Start: 2014-11-13 — End: 2014-11-14
  Administered 2014-11-13: 30 mg via SUBCUTANEOUS
  Filled 2014-11-13: qty 0.3

## 2014-11-13 MED ORDER — ACETAMINOPHEN 650 MG RE SUPP: 650.0000 mg | Freq: Four times a day (QID) | RECTAL | Status: DC | PRN

## 2014-11-13 MED ORDER — GUAIFENESIN ER 600 MG PO TB12
600.0000 mg | ORAL_TABLET | Freq: Two times a day (BID) | ORAL | Status: DC
Start: 2014-11-13 — End: 2014-11-15
  Administered 2014-11-13 – 2014-11-15 (×4): 600 mg via ORAL
  Filled 2014-11-13 (×4): qty 1

## 2014-11-13 MED ORDER — ASPIRIN 81 MG PO CHEW
324.0000 mg | CHEWABLE_TABLET | Freq: Once | ORAL | Status: AC
  Administered 2014-11-13: 324 mg via ORAL
  Filled 2014-11-13: qty 4

## 2014-11-13 MED ORDER — PANTOPRAZOLE SODIUM 40 MG PO TBEC
40.0000 mg | DELAYED_RELEASE_TABLET | Freq: Every day | ORAL | Status: DC
  Administered 2014-11-14 – 2014-11-15 (×2): 40 mg via ORAL
  Filled 2014-11-13 (×2): qty 1

## 2014-11-13 MED ORDER — PAROXETINE HCL 20 MG PO TABS
20.0000 mg | ORAL_TABLET | Freq: Every day | ORAL | Status: DC
  Administered 2014-11-14: 20 mg via ORAL
  Filled 2014-11-13: qty 1

## 2014-11-13 MED ORDER — SODIUM CHLORIDE 0.9 % IV SOLN: 250.0000 mL | INTRAVENOUS | Status: DC | PRN

## 2014-11-13 MED ORDER — MAGNESIUM HYDROXIDE 400 MG/5ML PO SUSP: 30.0000 mL | Freq: Every day | ORAL | Status: DC | PRN

## 2014-11-13 MED ORDER — ASPIRIN 325 MG PO TABS
325.0000 mg | ORAL_TABLET | Freq: Every day | ORAL | Status: DC
  Administered 2014-11-14 – 2014-11-15 (×2): 325 mg via ORAL
  Filled 2014-11-13 (×2): qty 1

## 2014-11-13 MED ORDER — POTASSIUM CHLORIDE CRYS ER 20 MEQ PO TBCR
20.0000 meq | EXTENDED_RELEASE_TABLET | Freq: Every day | ORAL | Status: DC
  Administered 2014-11-14 – 2014-11-15 (×2): 20 meq via ORAL
  Filled 2014-11-13 (×2): qty 1

## 2014-11-13 MED ORDER — SIMVASTATIN 20 MG PO TABS
20.0000 mg | ORAL_TABLET | Freq: Every day | ORAL | Status: DC
  Administered 2014-11-13: 20 mg via ORAL
  Filled 2014-11-13: qty 2

## 2014-11-13 MED ORDER — INSULIN ASPART 100 UNIT/ML ~~LOC~~ SOLN
0.0000 [IU] | Freq: Every day | SUBCUTANEOUS | Status: DC
  Administered 2014-11-13: 2 [IU] via SUBCUTANEOUS

## 2014-11-13 MED ORDER — POLYVINYL ALCOHOL 1.4 % OP SOLN
1.0000 [drp] | Freq: Two times a day (BID) | OPHTHALMIC | Status: DC
  Administered 2014-11-13 – 2014-11-15 (×3): 1 [drp] via OPHTHALMIC
  Filled 2014-11-13 (×3): qty 15

## 2014-11-13 MED ORDER — IPRATROPIUM-ALBUTEROL 0.5-2.5 (3) MG/3ML IN SOLN: 3.0000 mL | RESPIRATORY_TRACT | Status: DC | PRN

## 2014-11-13 MED ORDER — OXYBUTYNIN CHLORIDE ER 5 MG PO TB24
5.0000 mg | ORAL_TABLET | Freq: Every day | ORAL | Status: DC
  Administered 2014-11-13 – 2014-11-14 (×2): 5 mg via ORAL
  Filled 2014-11-13 (×2): qty 1

## 2014-11-13 MED ORDER — ALLOPURINOL 100 MG PO TABS
200.0000 mg | ORAL_TABLET | Freq: Every day | ORAL | Status: DC
  Administered 2014-11-14 – 2014-11-15 (×2): 200 mg via ORAL
  Filled 2014-11-13 (×2): qty 2

## 2014-11-13 MED ORDER — ACETAMINOPHEN 325 MG PO TABS: 650.0000 mg | ORAL_TABLET | Freq: Four times a day (QID) | ORAL | Status: DC | PRN

## 2014-11-13 MED ORDER — INSULIN GLARGINE 100 UNIT/ML ~~LOC~~ SOLN
5.0000 [IU] | Freq: Every day | SUBCUTANEOUS | Status: DC
  Administered 2014-11-13 – 2014-11-14 (×2): 5 [IU] via SUBCUTANEOUS
  Filled 2014-11-13 (×3): qty 0.05

## 2014-11-13 MED ORDER — SODIUM CHLORIDE 0.9 % IV SOLN
INTRAVENOUS | Status: DC
  Administered 2014-11-13: 15:00:00 via INTRAVENOUS

## 2014-11-13 MED ORDER — SODIUM CHLORIDE 0.9 % IJ SOLN: 3.0000 mL | INTRAMUSCULAR | Status: DC | PRN

## 2014-11-13 MED ORDER — SODIUM CHLORIDE 0.9 % IJ SOLN
3.0000 mL | Freq: Two times a day (BID) | INTRAMUSCULAR | Status: DC
  Administered 2014-11-14: 3 mL via INTRAVENOUS

## 2014-11-13 MED ORDER — POLYETHYL GLYCOL-PROPYL GLYCOL 0.4-0.3 % OP SOLN: 1.0000 [drp] | Freq: Two times a day (BID) | OPHTHALMIC | Status: DC

## 2014-11-13 MED ORDER — SODIUM CHLORIDE 0.9 % IJ SOLN
3.0000 mL | Freq: Two times a day (BID) | INTRAMUSCULAR | Status: DC
Start: 2014-11-13 — End: 2014-11-15
  Administered 2014-11-15: 3 mL via INTRAVENOUS

## 2014-11-13 MED ORDER — AMLODIPINE BESYLATE 5 MG PO TABS
5.0000 mg | ORAL_TABLET | Freq: Every day | ORAL | Status: DC
  Administered 2014-11-14: 5 mg via ORAL
  Filled 2014-11-13: qty 1

## 2014-11-13 MED ORDER — ONDANSETRON HCL 4 MG/2ML IJ SOLN: 4.0000 mg | Freq: Four times a day (QID) | INTRAMUSCULAR | Status: DC | PRN

## 2014-11-13 MED ORDER — TORSEMIDE 20 MG PO TABS
80.0000 mg | ORAL_TABLET | Freq: Every day | ORAL | Status: DC
  Administered 2014-11-14 – 2014-11-15 (×2): 80 mg via ORAL
  Filled 2014-11-13 (×2): qty 4

## 2014-11-13 MED ORDER — METOPROLOL TARTRATE 50 MG PO TABS
50.0000 mg | ORAL_TABLET | Freq: Two times a day (BID) | ORAL | Status: DC
  Administered 2014-11-13 – 2014-11-14 (×2): 50 mg via ORAL
  Filled 2014-11-13 (×2): qty 1

## 2014-11-13 NOTE — ED Notes (Signed)
Code Stroke called Per MD Dr. Reather Converse. SOC called and sent to [facility] at [facility].

## 2014-11-13 NOTE — ED Provider Notes (Signed)
CSN: [id number]     Arrival date & time 11/13/14  1242 History  This chart was scribed for Elnora Morrison, MD by Stephania Fragmin, ED Scribe. This patient was seen in room APA05/APA05 and the patient's care was started at 1:11 PM.     Chief Complaint  Patient presents with  . Altered Mental Status   Patient is a 81 y.o. female presenting with altered mental status. The history is provided by a relative. No language interpreter was used.  Altered Mental Status Presenting symptoms: unresponsiveness   Severity:  Moderate Most recent episode:  Today Episode history:  Single Duration:  30 minutes Timing:  Constant Progression:  Improving Chronicity:  New Context: recent infection   Associated symptoms: weakness      LEVEL 5 CAVEAT DUE TO AMS  HPI Comments: [name] is a 81 y.o. female with a history of CHF, DM, and carotidendarectomy who presents to the Emergency Department for sudden onset altered mental status. Per daughter, patient was nonverbal, and was not responding to her questions and statements when she visited her at [facility] about 30 minutes ago. She also had limp extremities that would drop whenever they were picked up. Her caregivers reported she was talking and eating normally this morning. Her daughter reports that she just finished a course of antibiotics for a UTI yesterday. Her sugar level taken today was 205. Patient normally ambulates with a wheelchair. Daughter denies a history of CVA. Her daughter is unsure if she has had any hematochezia.   Patient's daughter states that patient has a DNR order, and refuses aggressive treatment at end-of-life care.   Past Medical History  Diagnosis Date  . Breast cancer   . Essential hypertension, benign   . Depression   . GERD (gastroesophageal reflux disease)   . Hypercholesteremia   . CKD (chronic kidney disease) stage 3, GFR 30-59 ml/min   . Pneumonia   . UTI (lower urinary tract infection)   . Arthritis    . Asthma   . Venous stasis   . Type 2 diabetes mellitus   . Chronic diastolic heart failure     LVEF 70%  . Mitral stenosis     Moderate  . Secondary pulmonary hypertension     PASP 64 mmHg   Past Surgical History  Procedure Laterality Date  . Replacement total knee    . Mastectomy    . Total hip arthroplasty    . Carotid endarterectomy    . Cataracts     Family History  Problem Relation Age of Onset  . Asthma Other   . Diabetes Other    History  Substance Use Topics  . Smoking status: Never Smoker   . Smokeless tobacco: Never Used  . Alcohol Use: No   OB History    No data available     Review of Systems  Unable to perform ROS: Mental status change  Neurological: Positive for weakness.      Allergies  Carbapenems; Cephalosporins; Amoxicillin; and Penicillins  Home Medications   Prior to Admission medications   Medication Sig Start Date End Date Taking? Authorizing Provider  acetaminophen (TYLENOL) 325 MG tablet Take 650 mg by mouth every 6 (six) hours as needed for mild pain.     Historical Provider, MD  albuterol (PROVENTIL HFA;VENTOLIN HFA) 108 (90 BASE) MCG/ACT inhaler Inhale 2 puffs into the lungs every 6 (six) hours as needed for wheezing or shortness of breath.    Historical Provider, MD  allopurinol (ZYLOPRIM) 100 MG tablet TAKE (2) TABLETS BY MOUTH ONCE DAILY. 08/07/13   Mikey Kirschner, MD  alum & mag hydroxide-simeth [facility]) 200-200-20 MG/5ML suspension Take by mouth every 4 (four) hours as needed for indigestion or heartburn.    Historical Provider, MD  amLODipine (NORVASC) 5 MG tablet Take 1 tablet (5 mg total) by mouth daily. Patient taking differently: Take 10 mg by mouth daily.  04/19/14   Kathie Dike, MD  ASPIRIN LOW DOSE 81 MG EC tablet TAKE 1 TABLET BY MOUTH ONCE DAILY. 10/25/13   Mikey Kirschner, MD  benzonatate (TESSALON) 100 MG capsule Take 1 capsule (100 mg total) by mouth 3 (three) times daily as needed for cough. 04/19/14   Kathie Dike, MD  Cranberry 475 MG CAPS Take 1 capsule by mouth every 12 (twelve) hours.    Historical Provider, MD  Cranberry-Vitamin C-Inulin (UTI-STAT PO) Take 30 mLs by mouth daily.    Historical Provider, MD  Emollient (MOISTURIZING CREAM EX) Apply 1 application topically 2 (two) times daily.    Historical Provider, MD  fenofibrate 54 MG tablet Take 1 tablet (54 mg total) by mouth daily. 12/14/13   Radene Gunning, NP  guaiFENesin (MUCINEX) 600 MG 12 hr tablet Take 1 tablet (600 mg total) by mouth 2 (two) times daily. 04/19/14   Kathie Dike, MD  guaifenesin (TUSSIN) 100 MG/5ML syrup Take 200 mg by mouth every 6 (six) hours as needed for cough.    Historical Provider, MD  hydrOXYzine (ATARAX/VISTARIL) 25 MG tablet Take 12.5 mg by mouth every 6 (six) hours as needed for itching.    Historical Provider, MD  insulin glargine (LANTUS) 100 UNIT/ML injection Inject 5 Units into the skin at bedtime.    Historical Provider, MD  ipratropium-albuterol (DUONEB) 0.5-2.5 (3) MG/3ML SOLN Take 3 mLs by nebulization every 4 (four) hours as needed (shortness of breath).    Historical Provider, MD  magnesium hydroxide (MILK OF MAGNESIA) 400 MG/5ML suspension Take 30 mLs by mouth daily as needed for mild constipation.    Historical Provider, MD  metoprolol (LOPRESSOR) 50 MG tablet TAKE 1 TABLET BY MOUTH TWICE DAILY. 11/08/13   Mikey Kirschner, MD  omeprazole (PRILOSEC) 20 MG capsule Take 20 mg by mouth daily.    Historical Provider, MD  oxybutynin (DITROPAN-XL) 5 MG 24 hr tablet Take 1 tablet (5 mg total) by mouth at bedtime. 09/06/13   Mikey Kirschner, MD  PARoxetine (PAXIL) 20 MG tablet Take 20 mg by mouth every morning.    Historical Provider, MD  Polyethyl Glycol-Propyl Glycol (SYSTANE OP) Place 1 drop into both eyes 2 (two) times daily.    Historical Provider, MD  potassium chloride SA (K-DUR,KLOR-CON) 20 MEQ tablet Take 20 mEq by mouth daily.    Historical Provider, MD  simvastatin (ZOCOR) 20 MG tablet TAKE 1 TABLET  BY MOUTH AT BEDTIME. 11/08/13   Mikey Kirschner, MD  Spacer/Aero-Holding Chambers (AEROCHAMBER MV) inhaler Use as instructed. 2 puffs BID 09/28/13   Mikey Kirschner, MD  torsemide (DEMADEX) 20 MG tablet Take 80 mg by mouth daily. Take 4 tablets to equal 80 mg total  every morning and 60 mg every afternoon    Historical Provider, MD   BP 127/59 mmHg  Pulse 53  Temp(Src) 98.4 F (36.9 C) (Rectal)  Resp 28  Ht 5\' 5"  (1.651 m)  Wt 215 lb (97.523 kg)  BMI 35.78 kg/m2  SpO2 95% Physical Exam  Constitutional: She appears well-developed and well-nourished.  No distress.  Intermittently verbal. Responds to pain.  HENT:  Head: Normocephalic and atraumatic.  Moist mucus membranes.  Eyes: Conjunctivae and EOM are normal. Pupils are equal, round, and reactive to light.  Neck: Neck supple. No tracheal deviation present.  No meningismus.  Cardiovascular: Regular rhythm.  Bradycardia present.   Pulmonary/Chest: Effort normal. No respiratory distress.  Equal breath sounds in anterior lung fields.  Abdominal: Soft.  Musculoskeletal: She exhibits edema.  Grossly 4/5 equal strength in upper extremities. Unable to test arm drift. Able to move feet and toes equally.   Neurological: She is alert. A cranial nerve deficit is present. GCS eye subscore is 3. GCS verbal subscore is 3. GCS motor subscore is 4.  Difficult neuro exam. Not following all commands. Sensation intact.  Patient has 3-5 strength bilateral lower extremities, fortify strength bilateral upper extremity's. Sensation to pain intact.  Skin: Skin is warm and dry.  Psychiatric: She has a normal mood and affect. Her behavior is normal.  Nursing note and vitals reviewed.   ED Course  Procedures (including critical care time)  DIAGNOSTIC STUDIES: Oxygen Saturation is 98% on room air, normal by my interpretation.    COORDINATION OF CARE: 1:18 PM - Discussed treatment plan with pt's daughter at bedside which includes CAT scan, blood tests,  and UA, and pt's daughter agreed to plan.  2:13 PM - Patient's daughter informed of negative CT results and discussed treatment plan. Will call for hospital admission for MRI. Further neurological workup needed to evaluate possible seizure. Patient's daughter verbalized understanding and agreed to plan.   Labs Review Labs Reviewed  BASIC METABOLIC PANEL - Abnormal; Notable for the following:    Glucose, Bld 160 (*)    BUN 51 (*)    Creatinine, Ser 1.72 (*)    GFR calc non Af Amer 25 (*)    GFR calc Af Amer 29 (*)    All other components within normal limits  CBC WITH DIFFERENTIAL/PLATELET - Abnormal; Notable for the following:    Hemoglobin 11.6 (*)    RDW 15.8 (*)    All other components within normal limits  URINALYSIS, ROUTINE W REFLEX MICROSCOPIC - Abnormal; Notable for the following:    Hgb urine dipstick MODERATE (*)    All other components within normal limits  URINE MICROSCOPIC-ADD ON - Abnormal; Notable for the following:    Bacteria, UA FEW (*)    All other components within normal limits  URINE CULTURE  PROTIME-INR  ETHANOL  TROPONIN I  I-STAT CG4 LACTIC ACID, ED    Imaging Review Ct Head Wo Contrast  11/13/2014   CLINICAL DATA:  Code stroke, altered mental status  EXAM: CT HEAD WITHOUT CONTRAST  TECHNIQUE: Contiguous axial images were obtained from the base of the skull through the vertex without intravenous contrast.  COMPARISON:  04/03/2007  FINDINGS: Bilateral hearing aids are present with mild beam hardening artifact which partially obscures areas of the cerebellum and occipital lobes.  There is no evidence of mass effect, midline shift, or extra-axial fluid collections. There is no evidence of a space-occupying lesion or intracranial hemorrhage. There is no evidence of a cortical-based area of acute infarction. There is generalized cerebral atrophy. There is periventricular white matter low attenuation likely secondary to microangiopathy.  There is ventriculomegaly  which is commensurate with the degree cerebral atrophy, but can also be seen in normal pressure hydrocephalus. The basal cisterns are patent.  Visualized portions of the orbits are unremarkable. The visualized portions of the  paranasal sinuses and mastoid air cells are unremarkable. Cerebrovascular atherosclerotic calcifications are noted.  The osseous structures are unremarkable.  IMPRESSION: 1. No acute intracranial pathology. 2. Chronic microvascular disease and cerebral atrophy. 3. Ventriculomegaly which is commensurate with the degree cerebral atrophy, but can also be seen in normal pressure hydrocephalus. These results were called by telephone at the time of interpretation on 11/13/2014 at 1:46 pm to Dr. Elnora Morrison , who verbally acknowledged these results.   Electronically Signed   By: Kathreen Devoid   On: 11/13/2014 13:46     EKG Interpretation   Date/Time:  Sunday November 13 2014 12:51:53 EDT Ventricular Rate:  50 PR Interval:  200 QRS Duration: 101 QT Interval:  524 QTC Calculation: 478 R Axis:   70 Text Interpretation:  Sinus rhythm bradycardia Confirmed by Colman Birdwell  MD,  Sandeep Delagarza ([id number]) on 11/13/2014 1:54:09 PM      MDM   Final diagnoses:  Altered mental status  Acute encephalopathy  General weakness   I personally performed the services described in this documentation, which was scribed in my presence. The recorded information has been reviewed and is accurate.  Patient presents with acute mental status change decreased verbal and staring spell. Differential includes stroke, seizure, urine infection, other. CT head results reviewed and discussed with radiology, no acute bleed or obvious stroke.  Discussed with neurology, code stroke called as patient within time window. Patient a DO NOT RESUSCITATE and no aggressive measures requested by the daughter. Patient gradually improving in the ER and close to baseline on recheck and per daughter.  Discussed with neurology who recommended  MRI, admission and possible EEG for seizure like episode of staring.  The patients results and plan were reviewed and discussed.   Any x-rays performed were personally reviewed by myself.   Differential diagnosis were considered with the presenting HPI.  Medications  0.9 %  sodium chloride infusion (not administered)  aspirin chewable tablet 324 mg (324 mg Oral Given 11/13/14 1421)    Filed Vitals:   11/13/14 1330 11/13/14 1350 11/13/14 1353 11/13/14 1417  BP: 128/71 127/59    Pulse:  50 53   Temp:    98.4 F (36.9 C)  TempSrc:    Rectal  Resp:  17 28   Height:      Weight:      SpO2:  91% 95%     Final diagnoses:  Altered mental status  Acute encephalopathy  General weakness    Admission/ observation were discussed with the admitting physician, patient and/or family and they are comfortable with the plan.    Elnora Morrison, MD 11/13/14 [phone]

## 2014-11-13 NOTE — ED Notes (Signed)
Attempted to give report to receiving nurse but is unable and was told that nurse will call back in 5 minutes

## 2014-11-13 NOTE — ED Notes (Signed)
Reported that pt suddenly unable to follow commands with pursed lip breathing and "in a trance".  Pt is from [facility] and reported that family at bedside when occurred, reported that pt just finished an antibiotic for UTI yesterday

## 2014-11-13 NOTE — Progress Notes (Signed)
Patient ID: [name], female   DOB: [birth year], 81 y.o.   MRN: [id number] Facility; [facility] Chief complaint; found unresponsive today  History; the patient was apparently fine this morning Fetters ALT breakfast per dissipated and her morning routine. Later on was found unresponsive in her wheelchair. I'm not quite certain whether this was by the family or staff a period her vital signs have been stable however her blood pressure is at 93/52 O2 sats are 96%. Her blood sugar is over 200. She has had no prior history of seizures that I am aware of. He had recent problems with accumulating lower extremity edema probably secondary to right heart failure from chronic lung disease. She has had higher doses of diuretics with some weight loss. She was recently found to have a potassium of 3.2 and has been on replacement at the 40 mg once twice a day. Her current data Demadex doses 60 mg once twice a day.  Family reports that she has not been doing all well from a mental status point of view all week. The there was some unusual behavior for her including asking for people who are dead somewhat delusional. She was treated for a UTI with ciprofloxacin which ended yesterday at this point I don't have the actual culture result she has not had any recent trauma falls. As mentioned to the staff she was fine this morning  Past Medical History  Diagnosis Date  . Breast cancer   . Essential hypertension, benign   . Depression   . GERD (gastroesophageal reflux disease)   . Hypercholesteremia   . CKD (chronic kidney disease) stage 3, GFR 30-59 ml/min   . Pneumonia   . UTI (lower urinary tract infection)   . Arthritis   . Asthma   . Venous stasis   . Type 2 diabetes mellitus   . Chronic diastolic heart failure     LVEF 70%  . Mitral stenosis     Moderate  . Secondary pulmonary hypertension     PASP 64 mmHg   Current medications Allopurinol 200 a day ASA 81 daily Demadex 60 twice a day Humalog  sliding scale Hydralazine 12.5 every 6 when necessary Duo nebs every 4 when necessary Lopressor 50 twice a day Milk of magnesia 30 ML's once a day Mucinex 600 twice a day Potassium 40 mEq twice a day Proventil HFA 2 puffs every 6 when necessary Ranitidine 150 daily at bedtime Simvastatin 10 daily Tessalon 3 times a day when necessary Tylenol 650 every 6 when necessary Paxil 30 daily Lantus 5 units subcutaneous daily at bedtime  Physical examination Gen. the patient would not respond to speech. Temperature 98.2 pulse 65 respirations 18 blood pressure 93/52 CVG was greater than 200 O2 sat at 96% Respiratory exam was clear Cardiac heart sounds are normal no murmurs Abdomen soft without masses or tenderness GU no suprapubic or costovertebral angle tenderness. Neurologic; she would not spontaneously respond to commands however she did appear able to move both arms to stimuli she was able to wiggle her toes on both sides reflexes seem symmetric toes were downgoing. She seemed to become more alert as I was in the room however still not back to baseline.  Impression/plan #1 altered mental status this morning which was acute after being fairly functional through the breakfast hour. She apparently was not herself all week which was what led to the urine culture and the ciprofloxacin. His completed yesterday. I think she is going to need a CT  scan of her head although I'm not completely certain she has had a stroke. #2 the last 2 weeks without recent problems with increasing lower extremity edema right ventricular heart failure. She has a history of pulmonary hypertension. We are able to successfully diuresis her. Her potassium was due to be rechecked tomorrow  She will need CNS imaging. This reason I have sent her to the ER. Her electrolytes can be rechecked quickly. She is a bit hypotensive one would wonder whether this is prerenal and/or could represent an early septic picture.

## 2014-11-13 NOTE — H&P (Signed)
[facility] History and Physical  [name] MEQ:[id number] DOB: 1922-08-15 DOA: 11/13/2014  Referring physician: Dr. Reather Converse, ER  PCP: Cyndee Brightly, MD   Chief Complaint: encephalopathy  HPI: [name] is a 81 y.o. female who is a resident of SNF was brought to the ER today with change in mental status. Per patient's daughter she has not been herself for the last week. She has been hallucinating and increasingly confused. She was diagnosed with a UTI at the SNF and was treated appropriately with cipro. Urine culture was positive for E coli sensitive to cipro. Patient then apparently began to improve. Today, patient was found by family to have staring episode. There was no reported jerking movements or bowel/bladder incontinence, but family reports that she had a prolonged episode where she appeared "dazed". She was brought to the ED where initially there was concern for possible CVA and therefore code stroke was called. She was seen by tele neurologist and recommendations were for admission to the hospital for further evaluation. She was not felt to be a candidate for tPA by neurology   Review of Systems:  Limited due to patient's dementia.   Past Medical History  Diagnosis Date  . Breast cancer   . Essential hypertension, benign   . Depression   . GERD (gastroesophageal reflux disease)   . Hypercholesteremia   . CKD (chronic kidney disease) stage 3, GFR 30-59 ml/min   . Pneumonia   . UTI (lower urinary tract infection)   . Arthritis   . Asthma   . Venous stasis   . Type 2 diabetes mellitus   . Chronic diastolic heart failure     LVEF 70%  . Mitral stenosis     Moderate  . Secondary pulmonary hypertension     PASP 64 mmHg   Past Surgical History  Procedure Laterality Date  . Replacement total knee    . Mastectomy    . Total hip arthroplasty    . Carotid endarterectomy    . Cataracts     Social History:  reports that she has never smoked.  She has never used smokeless tobacco. She reports that she does not drink alcohol or use illicit drugs.  Allergies  Allergen Reactions  . Carbapenems Other (See Comments)    unknown  . Cephalosporins Other (See Comments)    unknown  . Amoxicillin Rash  . Penicillins Rash    Family History  Problem Relation Age of Onset  . Asthma Other   . Diabetes Other     Prior to Admission medications   Medication Sig Start Date End Date Taking? Authorizing Provider  acetaminophen (TYLENOL) 325 MG tablet Take 650 mg by mouth every 6 (six) hours as needed for mild pain.     Historical Provider, MD  albuterol (PROVENTIL HFA;VENTOLIN HFA) 108 (90 BASE) MCG/ACT inhaler Inhale 2 puffs into the lungs every 6 (six) hours as needed for wheezing or shortness of breath.    Historical Provider, MD  allopurinol (ZYLOPRIM) 100 MG tablet TAKE (2) TABLETS BY MOUTH ONCE DAILY. 08/07/13   Mikey Kirschner, MD  alum & mag hydroxide-simeth [facility]) 200-200-20 MG/5ML suspension Take by mouth every 4 (four) hours as needed for indigestion or heartburn.    Historical Provider, MD  amLODipine (NORVASC) 5 MG tablet Take 1 tablet (5 mg total) by mouth daily. Patient taking differently: Take 10 mg by mouth daily.  04/19/14   Kathie Dike, MD  ASPIRIN LOW DOSE 81 MG EC tablet TAKE  1 TABLET BY MOUTH ONCE DAILY. 10/25/13   Mikey Kirschner, MD  benzonatate (TESSALON) 100 MG capsule Take 1 capsule (100 mg total) by mouth 3 (three) times daily as needed for cough. 04/19/14   Kathie Dike, MD  Cranberry 475 MG CAPS Take 1 capsule by mouth every 12 (twelve) hours.    Historical Provider, MD  Cranberry-Vitamin C-Inulin (UTI-STAT PO) Take 30 mLs by mouth daily.    Historical Provider, MD  Emollient (MOISTURIZING CREAM EX) Apply 1 application topically 2 (two) times daily.    Historical Provider, MD  fenofibrate 54 MG tablet Take 1 tablet (54 mg total) by mouth daily. 12/14/13   Radene Gunning, NP  guaiFENesin (MUCINEX) 600 MG 12 hr  tablet Take 1 tablet (600 mg total) by mouth 2 (two) times daily. 04/19/14   Kathie Dike, MD  guaifenesin (TUSSIN) 100 MG/5ML syrup Take 200 mg by mouth every 6 (six) hours as needed for cough.    Historical Provider, MD  hydrOXYzine (ATARAX/VISTARIL) 25 MG tablet Take 12.5 mg by mouth every 6 (six) hours as needed for itching.    Historical Provider, MD  insulin glargine (LANTUS) 100 UNIT/ML injection Inject 5 Units into the skin at bedtime.    Historical Provider, MD  ipratropium-albuterol (DUONEB) 0.5-2.5 (3) MG/3ML SOLN Take 3 mLs by nebulization every 4 (four) hours as needed (shortness of breath).    Historical Provider, MD  magnesium hydroxide (MILK OF MAGNESIA) 400 MG/5ML suspension Take 30 mLs by mouth daily as needed for mild constipation.    Historical Provider, MD  metoprolol (LOPRESSOR) 50 MG tablet TAKE 1 TABLET BY MOUTH TWICE DAILY. 11/08/13   Mikey Kirschner, MD  omeprazole (PRILOSEC) 20 MG capsule Take 20 mg by mouth daily.    Historical Provider, MD  oxybutynin (DITROPAN-XL) 5 MG 24 hr tablet Take 1 tablet (5 mg total) by mouth at bedtime. 09/06/13   Mikey Kirschner, MD  PARoxetine (PAXIL) 20 MG tablet Take 20 mg by mouth every morning.    Historical Provider, MD  Polyethyl Glycol-Propyl Glycol (SYSTANE OP) Place 1 drop into both eyes 2 (two) times daily.    Historical Provider, MD  potassium chloride SA (K-DUR,KLOR-CON) 20 MEQ tablet Take 20 mEq by mouth daily.    Historical Provider, MD  simvastatin (ZOCOR) 20 MG tablet TAKE 1 TABLET BY MOUTH AT BEDTIME. 11/08/13   Mikey Kirschner, MD  Spacer/Aero-Holding Chambers (AEROCHAMBER MV) inhaler Use as instructed. 2 puffs BID 09/28/13   Mikey Kirschner, MD  torsemide (DEMADEX) 20 MG tablet Take 80 mg by mouth daily. Take 4 tablets to equal 80 mg total  every morning and 60 mg every afternoon    Historical Provider, MD   Physical Exam: Filed Vitals:   11/13/14 1353 11/13/14 1417 11/13/14 1528 11/13/14 1650  BP:   137/65 136/72  Pulse:  53  75 70  Temp:  98.4 F (36.9 C) 97.8 F (36.6 C) 97.9 F (36.6 C)  TempSrc:  Rectal Oral Oral  Resp: 28  17 18   Height:    5\' 5"  (1.651 m)  Weight:    96.525 kg (212 lb 12.8 oz)  SpO2: 95%  96% 96%    Wt Readings from Last 3 Encounters:  11/13/14 96.525 kg (212 lb 12.8 oz)  04/20/14 97.2 kg (214 lb 4.6 oz)  02/04/14 98.884 kg (218 lb)    General:  Appears calm and comfortable Eyes: PERRL, normal lids, irises & conjunctiva ENT: grossly normal hearing, lips &  tongue Neck: no LAD, masses or thyromegaly Cardiovascular: RRR, no m/r/g. trace LE edema. Telemetry: SR, no arrhythmias  Respiratory: crackles at bases, no w/r/r. Normal respiratory effort. Abdomen: soft, ntnd Skin: no rash or induration seen on limited exam Musculoskeletal: grossly normal tone BUE/BLE Psychiatric: grossly normal mood and affect, speech fluent and appropriate Neurologic: grossly non-focal.          Labs on Admission:  Basic Metabolic Panel:  Recent Labs Lab 11/07/14 0715 11/09/14 0720 11/13/14 1324  NA 141 138 137  K 4.5 3.2* 4.2  CL 107 105 105  CO2 26 26 23   GLUCOSE 148* 140* 160*  BUN 59* 48* 51*  CREATININE 1.60* 1.66* 1.72*  CALCIUM 9.0 8.6 9.1   Liver Function Tests: No results for input(s): AST, ALT, ALKPHOS, BILITOT, PROT, ALBUMIN in the last 168 hours. No results for input(s): LIPASE, AMYLASE in the last 168 hours. No results for input(s): AMMONIA in the last 168 hours. CBC:  Recent Labs Lab 11/13/14 1324  WBC 9.0  NEUTROABS 6.0  HGB 11.6*  HCT 36.0  MCV 88.9  PLT 201   Cardiac Enzymes:  Recent Labs Lab 11/13/14 1324  TROPONINI <0.03    BNP (last 3 results) No results for input(s): BNP in the last 8760 hours.  ProBNP (last 3 results)  Recent Labs  12/10/13 0928 04/15/14 0025  PROBNP 856.7* 2691.0*    CBG: No results for input(s): GLUCAP in the last 168 hours.  Radiological Exams on Admission: Ct Head Wo Contrast  11/13/2014   CLINICAL DATA:   Code stroke, altered mental status  EXAM: CT HEAD WITHOUT CONTRAST  TECHNIQUE: Contiguous axial images were obtained from the base of the skull through the vertex without intravenous contrast.  COMPARISON:  04/03/2007  FINDINGS: Bilateral hearing aids are present with mild beam hardening artifact which partially obscures areas of the cerebellum and occipital lobes.  There is no evidence of mass effect, midline shift, or extra-axial fluid collections. There is no evidence of a space-occupying lesion or intracranial hemorrhage. There is no evidence of a cortical-based area of acute infarction. There is generalized cerebral atrophy. There is periventricular white matter low attenuation likely secondary to microangiopathy.  There is ventriculomegaly which is commensurate with the degree cerebral atrophy, but can also be seen in normal pressure hydrocephalus. The basal cisterns are patent.  Visualized portions of the orbits are unremarkable. The visualized portions of the paranasal sinuses and mastoid air cells are unremarkable. Cerebrovascular atherosclerotic calcifications are noted.  The osseous structures are unremarkable.  IMPRESSION: 1. No acute intracranial pathology. 2. Chronic microvascular disease and cerebral atrophy. 3. Ventriculomegaly which is commensurate with the degree cerebral atrophy, but can also be seen in normal pressure hydrocephalus. These results were called by telephone at the time of interpretation on 11/13/2014 at 1:46 pm to Dr. Elnora Morrison , who verbally acknowledged these results.   Electronically Signed   By: Kathreen Devoid   On: 11/13/2014 13:46   Dg Chest Portable 1 View  11/13/2014   CLINICAL DATA:  Altered mental status.  Shoulder pain.  EXAM: PORTABLE CHEST - 1 VIEW  COMPARISON:  10/15/2014 and 09/17/2014  FINDINGS: Lungs are adequately inflated and demonstrate mild prominence of the perihilar markings suggesting minimal vascular congestion. There is mild stable cardiomegaly. There  is no evidence of effusion. Calcified mediastinal and right hilar lymph nodes unchanged. Remainder of the exam is unchanged.  IMPRESSION: Mild stable cardiomegaly with evidence of minimal vascular congestion.   Electronically Signed  By: Marin Olp M.D.   On: 11/13/2014 15:15    EKG: Independently reviewed. bradycardia  Assessment/Plan Principal Problem:   Acute encephalopathy Active Problems:   Essential hypertension, benign   Dyslipidemia   Type 2 diabetes with nephropathy   Chronic diastolic heart failure   Chronic kidney disease (CKD), stage IV (severe)   Encephalopathy   1. Encephalopathy. Unclear etiology. Based on history, there is concern for underlying seizure. Will check EEG. Will also check MRI brain to rule out other pathologies. Will request neurology for further input. She was also noted to be significantly bradycardic on admission with a heart rate in the 40-50s. Unclear whether this is playing a role in her presentation. We will continue to monitor her on telemetry for now. Cycle cardiac markers. 2. Chronic diastolic congestive heart failure. Appears compensated at this time. Continue outpatient regimen. 3. Chronic kidney disease stage IV. Creatinine appears to be at baseline. 4. Type 2 diabetes. Continue outpatient regimen and sliding scale insulin. 5. Hypertension. Appears stable 6. Dyslipidemia. Continue statin.   Code Status: DNR DVT Prophylaxis: lovenox Family Communication: discussed with daughter Fraser Din over the phone Disposition Plan: discharge back to [place] center once improved  Time spent: 32mins  Pavneet Markwood [facility] Pager [phone]

## 2014-11-13 NOTE — Progress Notes (Signed)
Patient ID: AIRELLE EVERDING, female   DOB: 05/05/1922, 81 y.o.   MRN: [id number]                PROGRESS NOTE  DATE:  11/09/2014           FACILITY: [facility]          LEVEL OF CARE:   SNF   Acute Visit                   HISTORY OF PRESENT ILLNESS:  We continue to have difficulties with Mrs. Apel, who is probably a patient with cor pulmonale secondary to COPD.    She also has severe chronic renal failure with a baseline creatinine of around 1.6, estimated GFR of 26, making this stage IV.    In February, she developed markedly increasing edema.  I think her Demadex got up to 60 mg b.i.d., with 2.5 of metolazone every other day.  Predictably, we developed considerable fluid loss as well as kaliuresis and hypokalemia.    LABORATORY DATA:  Lab work today shows a sodium of 138, potassium of 3.2.  Her BUN is 48, creatinine 1.66.  Her potassium was previously 4.5 on 11/07/2014, although I think the explanation behind this drop simply represents potassium redistribution intracellularly from the vascular space.  Her BUN and creatinine are relatively stable for her.    REVIEW OF SYSTEMS:   CHEST/RESPIRATORY:  She is not complaining of shortness of breath.   CARDIAC:  No chest pain.   GI:  No nausea or vomiting.    PHYSICAL EXAMINATION:   VITAL SIGNS:   O2 SATURATIONS:  95% on room air.   RESPIRATIONS:  18 and unlabored.   PULSE:  62 and regular.   WEIGHT:   I do not have a weight from today.  However, her weight on the 7th was 212 pounds, up roughly 2 pounds from the nadir of her reduction after the metolazone.   GENERAL APPEARANCE:  The patient is not in any distress.   CHEST/RESPIRATORY:  Shallow air entry, but no crackles or wheezes.      CARDIOVASCULAR:   CARDIAC:  Heart sounds are normal.  Her JVP is elevated.    EDEMA/VARICOSITIES:  However, there is no coccyx edema and her lower extremity edema is still markedly improved.   GASTROINTESTINAL:   ABDOMEN:   Obese, but no tenderness.  No masses.    ASSESSMENT/PLAN:                           Right heart failure predominantly.  I am not going to change her current dose of Demadex, which is 60 b.i.d.    I am going to change her potassium to 40 mEq b.i.d.   Repeat her basic metabolic panel next week.    Hypokalemia.  I think this is largely redistribution intracellularly.  I am going to increase her potassium slightly.       CPT CODE: 34742

## 2014-11-14 ENCOUNTER — Inpatient Hospital Stay (HOSPITAL_COMMUNITY): Payer: Medicare Other

## 2014-11-14 ENCOUNTER — Inpatient Hospital Stay (HOSPITAL_COMMUNITY)
Admit: 2014-11-14 | Discharge: 2014-11-14 | Disposition: A | Discharge status: Home / Self Care | Payer: Medicare Other | Attending: Internal Medicine | Admitting: Internal Medicine

## 2014-11-14 DIAGNOSIS — I10 Essential (primary) hypertension: Secondary | ICD-10-CM

## 2014-11-14 LAB — URINE CULTURE
Colony Count: NO GROWTH
Culture: NO GROWTH

## 2014-11-14 LAB — CBC
HCT: 35.4 % — ABNORMAL LOW (ref 36.0–46.0)
Hemoglobin: 11.2 g/dL — ABNORMAL LOW (ref 12.0–15.0)
MCH: 28.2 pg (ref 26.0–34.0)
MCHC: 31.6 g/dL (ref 30.0–36.0)
MCV: 89.2 fL (ref 78.0–100.0)
PLATELETS: 181 10*3/uL (ref 150–400)
RBC: 3.97 MIL/uL (ref 3.87–5.11)
RDW: 15.9 % — ABNORMAL HIGH (ref 11.5–15.5)
WBC: 8.2 10*3/uL (ref 4.0–10.5)

## 2014-11-14 LAB — BASIC METABOLIC PANEL
ANION GAP: 7 (ref 5–15)
BUN: 42 mg/dL — ABNORMAL HIGH (ref 6–23)
CALCIUM: 8.9 mg/dL (ref 8.4–10.5)
CO2: 25 mmol/L (ref 19–32)
Chloride: 108 mmol/L (ref 96–112)
Creatinine, Ser: 1.3 mg/dL — ABNORMAL HIGH (ref 0.50–1.10)
GFR calc Af Amer: 41 mL/min — ABNORMAL LOW (ref >90)
GFR calc non Af Amer: 35 mL/min — ABNORMAL LOW (ref >90)
GLUCOSE: 136 mg/dL — AB (ref 70–99)
Potassium: 3.5 mmol/L (ref 3.5–5.1)
SODIUM: 140 mmol/L (ref 135–145)

## 2014-11-14 LAB — GLUCOSE, CAPILLARY
GLUCOSE-CAPILLARY: 118 mg/dL — AB (ref 70–99)
Glucose-Capillary: 143 mg/dL — ABNORMAL HIGH (ref 70–99)
Glucose-Capillary: 192 mg/dL — ABNORMAL HIGH (ref 70–99)
Glucose-Capillary: 200 mg/dL — ABNORMAL HIGH (ref 70–99)

## 2014-11-14 LAB — TROPONIN I
Troponin I: <0.03 ng/mL (ref <0.031)
Troponin I: <0.03 ng/mL (ref <0.031)

## 2014-11-14 MED ORDER — AMLODIPINE BESYLATE 5 MG PO TABS
10.0000 mg | ORAL_TABLET | Freq: Every day | ORAL | Status: DC
  Administered 2014-11-14: 10 mg via ORAL
  Filled 2014-11-14 (×2): qty 2

## 2014-11-14 MED ORDER — PAROXETINE HCL 20 MG PO TABS
30.0000 mg | ORAL_TABLET | Freq: Every day | ORAL | Status: DC
  Administered 2014-11-14 – 2014-11-15 (×2): 30 mg via ORAL
  Filled 2014-11-14: qty 2

## 2014-11-14 MED ORDER — METOPROLOL TARTRATE 25 MG PO TABS
25.0000 mg | ORAL_TABLET | Freq: Two times a day (BID) | ORAL | Status: DC
  Administered 2014-11-14 – 2014-11-15 (×2): 25 mg via ORAL
  Filled 2014-11-14 (×2): qty 1

## 2014-11-14 MED ORDER — SIMVASTATIN 10 MG PO TABS
10.0000 mg | ORAL_TABLET | Freq: Every day | ORAL | Status: DC
  Administered 2014-11-14: 10 mg via ORAL
  Filled 2014-11-14: qty 1

## 2014-11-14 MED ORDER — ENOXAPARIN SODIUM 40 MG/0.4ML ~~LOC~~ SOLN
40.0000 mg | SUBCUTANEOUS | Status: DC
  Administered 2014-11-14: 40 mg via SUBCUTANEOUS
  Filled 2014-11-14: qty 0.4

## 2014-11-14 NOTE — Progress Notes (Signed)
EEG Completed; Results Pending  

## 2014-11-14 NOTE — Progress Notes (Signed)
[facility] PROGRESS NOTE  [name] MPN:[id number] DOB: 1922-07-12 DOA: 11/13/2014 PCP: Cyndee Brightly, MD  Assessment/Plan: 1. Encephalopathy. Etiology is not clear. Possible seizures with "staring spells". EEG has been done with report pending. The patient recently had a urinary tract infection and was appropriately treated. Follow-up urinalysis showed clearing. She's not had any further episodes since being in the hospital. Neurology is following. 2. Acute CVA. Found on MRI. Felt to be incidental per neurology. Patient has refused further imaging studies including echo, carotid Dopplers, as well as further blood testing. Her aspirin was increased from 81 mg to 325 mg. 3. Chronic kidney disease stage IV. Creatinine appears to be at baseline. 4. Chronic diastolic congestive heart failure. Appears compensated. Continue current regimen. 5. Type 2 diabetes. Blood sugars remained stable. Continue current regimen 6. Bradycardia. Patient has had episodes where her heart rate drops into the 40s. Will decrease Lopressor from 50 mg 25 mg twice a day. 7. Dyslipidemia. Continue statin. 8. Hypertension. Appears stable  Code Status: DNR Family Communication:  Discussed with daughter at the bedside Disposition Plan: discharge back to SNF when ready   Consultants:  Neurology  Procedures:  EEG results pending  Antibiotics:    HPI/Subjective: Daughter reports that patient is more disoriented and confused today  Objective: Filed Vitals:   11/14/14 0535  BP: 145/53  Pulse: 68  Temp: 98.1 F (36.7 C)  Resp: 20    Intake/Output Summary (Last 24 hours) at 11/14/14 1312 Last data filed at 11/14/14 0900  Gross per 24 hour  Intake    240 ml  Output    300 ml  Net    -60 ml   Filed Weights   11/13/14 1251 11/13/14 1650  Weight: 97.523 kg (215 lb) 96.525 kg (212 lb 12.8 oz)    Exam:   General:  NAD  Cardiovascular: s1, s2, rrr  Respiratory: crackles at  bases  Abdomen: soft, nd, nt,bs+  Musculoskeletal: 1+ edema b/l   Data Reviewed: Basic Metabolic Panel:  Recent Labs Lab 11/09/14 0720 11/13/14 1324 11/13/14 1902 11/14/14 0705  NA 138 137  --  140  K 3.2* 4.2  --  3.5  CL 105 105  --  108  CO2 26 23  --  25  GLUCOSE 140* 160*  --  136*  BUN 48* 51*  --  42*  CREATININE 1.66* 1.72* 1.60* 1.30*  CALCIUM 8.6 9.1  --  8.9   Liver Function Tests: No results for input(s): AST, ALT, ALKPHOS, BILITOT, PROT, ALBUMIN in the last 168 hours. No results for input(s): LIPASE, AMYLASE in the last 168 hours. No results for input(s): AMMONIA in the last 168 hours. CBC:  Recent Labs Lab 11/13/14 1324 11/13/14 1902 11/14/14 0705  WBC 9.0 8.7 8.2  NEUTROABS 6.0  --   --   HGB 11.6* 11.2* 11.2*  HCT 36.0 35.2* 35.4*  MCV 88.9 88.4 89.2  PLT 201 204 181   Cardiac Enzymes:  Recent Labs Lab 11/13/14 1324 11/13/14 1902 11/14/14 0101 11/14/14 0705  TROPONINI <0.03 <0.03 <0.03 <0.03   BNP (last 3 results) No results for input(s): BNP in the last 8760 hours.  ProBNP (last 3 results)  Recent Labs  12/10/13 0928 04/15/14 0025  PROBNP 856.7* 2691.0*    CBG:  Recent Labs Lab 11/13/14 2219 11/14/14 0730 11/14/14 1115  GLUCAP 204* 143* 192*    Recent Results (from the past 240 hour(s))  Culture, Urine     Status: None  Collection Time: 11/04/14  1:20 PM  Result Value Ref Range Status   Specimen Description URINE, CLEAN CATCH  Final   Special Requests NONE  Final   Colony Count   Final    >=100,000 COLONIES/ML Performed at [facility]    Culture   Final    ESCHERICHIA COLI Performed at [facility]    Report Status 11/07/2014 FINAL  Final   Organism ID, Bacteria ESCHERICHIA COLI  Final      Susceptibility   Escherichia coli - MIC*    AMPICILLIN >=32 RESISTANT Resistant     CEFAZOLIN <=4 SENSITIVE Sensitive     CEFTRIAXONE <=1 SENSITIVE Sensitive     CIPROFLOXACIN <=0.25 SENSITIVE  Sensitive     GENTAMICIN <=1 SENSITIVE Sensitive     LEVOFLOXACIN 1 SENSITIVE Sensitive     NITROFURANTOIN <=16 SENSITIVE Sensitive     TOBRAMYCIN <=1 SENSITIVE Sensitive     TRIMETH/SULFA >=320 RESISTANT Resistant     PIP/TAZO <=4 SENSITIVE Sensitive     * ESCHERICHIA COLI     Studies: Ct Head Wo Contrast  11/13/2014   CLINICAL DATA:  Code stroke, altered mental status  EXAM: CT HEAD WITHOUT CONTRAST  TECHNIQUE: Contiguous axial images were obtained from the base of the skull through the vertex without intravenous contrast.  COMPARISON:  04/03/2007  FINDINGS: Bilateral hearing aids are present with mild beam hardening artifact which partially obscures areas of the cerebellum and occipital lobes.  There is no evidence of mass effect, midline shift, or extra-axial fluid collections. There is no evidence of a space-occupying lesion or intracranial hemorrhage. There is no evidence of a cortical-based area of acute infarction. There is generalized cerebral atrophy. There is periventricular white matter low attenuation likely secondary to microangiopathy.  There is ventriculomegaly which is commensurate with the degree cerebral atrophy, but can also be seen in normal pressure hydrocephalus. The basal cisterns are patent.  Visualized portions of the orbits are unremarkable. The visualized portions of the paranasal sinuses and mastoid air cells are unremarkable. Cerebrovascular atherosclerotic calcifications are noted.  The osseous structures are unremarkable.  IMPRESSION: 1. No acute intracranial pathology. 2. Chronic microvascular disease and cerebral atrophy. 3. Ventriculomegaly which is commensurate with the degree cerebral atrophy, but can also be seen in normal pressure hydrocephalus. These results were called by telephone at the time of interpretation on 11/13/2014 at 1:46 pm to Dr. Elnora Morrison , who verbally acknowledged these results.   Electronically Signed   By: Kathreen Devoid   On: 11/13/2014 13:46    Mr Brain Wo Contrast  11/14/2014   CLINICAL DATA:  Altered mental status. Confusion. Personal history of breast cancer.  EXAM: MRI HEAD WITHOUT CONTRAST  TECHNIQUE: Multiplanar, multiecho pulse sequences of the brain and surrounding structures were obtained without intravenous contrast.  COMPARISON:  Head CT 11/13/2014 and previous  FINDINGS: There is a background pattern of advanced generalized brain atrophy. There is chronic ventriculomegaly related to deep white matter volume loss. There are multiple old infarctions throughout the brain. Chronic small-vessel changes are present throughout the pons. There is an old inferior cerebellar infarction on the left. There are extensive chronic small vessel ischemic changes throughout the hemispheric deep and subcortical white matter. There are small cortical and subcortical infarctions in the right frontal region in the left occipital region. There are chronic small-vessel ischemic changes affecting the thalami, basal ganglia and major white matter tracts. There is an acute 4 mm infarction in the left anterior body of  the caudate. No other acute infarction. No evidence of mass lesion, hemorrhage or extra-axial collection. No pituitary mass. No inflammatory sinus disease. No skull or skullbase lesion.  IMPRESSION: 4 mm acute infarction in the left anterior body of the caudate. No other acute infarction.  Background pattern of advanced brain atrophy and widespread chronic ischemic changes as outlined above.  Ventriculomegaly, consistent with central atrophy and in proportion to the degree of generalized brain volume loss.   Electronically Signed   By: Nelson Chimes M.D.   On: 11/14/2014 08:57   Dg Chest Portable 1 View  11/13/2014   CLINICAL DATA:  Altered mental status.  Shoulder pain.  EXAM: PORTABLE CHEST - 1 VIEW  COMPARISON:  10/15/2014 and 09/17/2014  FINDINGS: Lungs are adequately inflated and demonstrate mild prominence of the perihilar markings suggesting  minimal vascular congestion. There is mild stable cardiomegaly. There is no evidence of effusion. Calcified mediastinal and right hilar lymph nodes unchanged. Remainder of the exam is unchanged.  IMPRESSION: Mild stable cardiomegaly with evidence of minimal vascular congestion.   Electronically Signed   By: Marin Olp M.D.   On: 11/13/2014 15:15    Scheduled Meds: . allopurinol  200 mg Oral Daily  . amLODipine  5 mg Oral Daily  . aspirin  325 mg Oral Daily  . enoxaparin (LOVENOX) injection  30 mg Subcutaneous Q24H  . guaiFENesin  600 mg Oral BID  . insulin aspart  0-15 Units Subcutaneous TID WC  . insulin aspart  0-5 Units Subcutaneous QHS  . insulin glargine  5 Units Subcutaneous QHS  . metoprolol  50 mg Oral BID  . oxybutynin  5 mg Oral QHS  . pantoprazole  40 mg Oral Daily  . PARoxetine  20 mg Oral Daily  . polyvinyl alcohol  1 drop Both Eyes BID  . potassium chloride SA  20 mEq Oral Daily  . simvastatin  20 mg Oral QHS  . sodium chloride  3 mL Intravenous Q12H  . sodium chloride  3 mL Intravenous Q12H  . torsemide  80 mg Oral Daily   Continuous Infusions:   Principal Problem:   Acute encephalopathy Active Problems:   Essential hypertension, benign   Dyslipidemia   Type 2 diabetes with nephropathy   Chronic diastolic heart failure   Chronic kidney disease (CKD), stage IV (severe)   Encephalopathy    Time spent: 75mins    MEMON,JEHANZEB  [facility] Pager [phone]. If 7PM-7AM, please contact night-coverage at [email or web address], password [facility] 11/14/2014, 1:12 PM  LOS: 1 day

## 2014-11-14 NOTE — Care Management Note (Addendum)
    Page 1 of 1   11/15/2014     11:42:42 AM CARE MANAGEMENT NOTE 11/15/2014  Patient:  [name], [name]   Account Number:  [id number]  Date Initiated:  11/14/2014  Documentation initiated by:  Theophilus Kinds  Subjective/Objective Assessment:   Pt admitted from [facility] with encephalopathy and acute CVA. Pt will return to facility at discharge.     Action/Plan:   CSW is aware and will arrange discharge to facility when medically stable.   Anticipated DC Date:  11/16/2014   Anticipated DC Plan:  SKILLED NURSING FACILITY  In-house referral  Clinical Social Worker      DC Planning Services  CM consult      Choice offered to / List presented to:             Status of service:  Completed, signed off Medicare Important Message given?  NA - LOS <3 / Initial given by admissions (If response is "NO", the following Medicare IM given date fields will be blank) Date Medicare IM given:   Medicare IM given by:   Date Additional Medicare IM given:   Additional Medicare IM given by:    Discharge Disposition:  [place]  Per UR Regulation:    If discussed at Long Length of Stay Meetings, dates discussed:    Comments:  11/15/14 Wilson, RN BSN CM Pt discharged to [facility] today. CSW to arrange discharge to facility.  11/14/14 Noble, RN BSN CM

## 2014-11-14 NOTE — Progress Notes (Signed)
Patient refused labs to be drawn.

## 2014-11-14 NOTE — Consult Note (Signed)
Taylor A. Merlene Laughter, MD     [email or web address]          [name] is an 81 y.o. female.   ASSESSMENT/PLAN: 1. Encephalopathy. Likely multifactorial including UTI and possible seizures given the staring spells. Agree with EEG. 2. Possible underlying mild cognitive impairment/mild dementia. Dementia labs will be obtained. 3. 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 This is likely an incidental finding and unlikely to explain the confusion or hallucination or staring spells. Agree with aspirin 325 increased from 81 mg. Obtain carotid duplex Doppler and echocardiography. 4. Ventriculomegaly suspicious for normal pressure hydrocephalus. Consider further evaluation ( High volume spinal tap) depending on patient outcome.   This is a 81 year old white female who presents with confusion and hallucinations over the past week. She has been diagnosed with a UTI and treated with Cipro. It appears that she may have had some improvement in the confusion and hallucinations. However, over the last couple days or so the patient had been having spells of staring and unresponsiveness. This resulted the patient be taken to the emergency room for further evaluation. She apparently has stiffening hearing impairment which may. The evaluation. She is unable to provide history due to the confusion. She does complain of having significant pain on evaluation. The pain involves the right shoulder and extremities especially the knees.  GENERAL: The patient is in no acute distress. She does corporate at times with evaluation by the time she is quite resistant.  HEENT: Supple. Atraumatic normocephalic. There is significant hearing impairment. Hearing aids are in place.  ABDOMEN: soft  EXTREMITIES: There is mild ankle and foot edema. There is mild discoloration/Browning of the distal ankles. There is severe arthritic changes of the knees. There is pain on  motion of the right shoulder.   BACK: Normal.  SKIN: Normal by inspection.    MENTAL STATUS: She is awake and alert. She does coorperates with evaluation most times but at time she resists. She follows commands briskly by holding up 2 hands. However, at times he seemed to have difficulties following some commands that are more complex.  CRANIAL NERVES: Pupils are equal, round and reactive to light and accommodation; extra ocular movements are full, there is no significant nystagmus; visual fields are full - Limited due to difficulty following commands but seemed to be okay with direct threats; upper and lower facial muscles are normal in strength and symmetric, there is no flattening of the nasolabial folds; Unable to test shoulder movements, tongue or uvula due to lack of cooperation.  MOTOR: She appears to move both sides well and symmetrically. Bulk and tone are normal.  COORDINATION: Left finger to nose is normal, right finger to nose is normal, No rest tremor; no intention tremor; no postural tremor; no bradykinesia.  REFLEXES: Deep tendon reflexes are symmetrical and normal. Babinski reflexes are flexor bilaterally.   SENSATION: Normal to light touch and pain.   Brain MRI is reviewed in person. There is rather severe ventriculomegaly which seems out of proportion to atrophy raising the suspicion for normal pressure hydrocephalus. There is marked confluent periventricular leukoencephalopathy and deep white matter lesions. There is a tiny acute infarcts seen on DWI involving the head of the Caudate nucleus on the left side.   Blood pressure 145/53, pulse 68, temperature 98.1 F (36.7 C), temperature source Oral, resp. rate 20, height _0  (1.651 m), weight 96.525 kg (212 lb 12.8 oz), SpO2 97 %.  Past Medical  History  Diagnosis Date  . Breast cancer   . Essential hypertension, benign   . Depression   . GERD (gastroesophageal reflux disease)   . Hypercholesteremia   . CKD (chronic  kidney disease) stage 3, GFR 30-59 ml/min   . Pneumonia   . UTI (lower urinary tract infection)   . Arthritis   . Asthma   . Venous stasis   . Type 2 diabetes mellitus   . Chronic diastolic heart failure     LVEF 70%  . Mitral stenosis     Moderate  . Secondary pulmonary hypertension     PASP 64 mmHg    Past Surgical History  Procedure Laterality Date  . Replacement total knee    . Mastectomy    . Total hip arthroplasty    . Carotid endarterectomy    . Cataracts      Family History  Problem Relation Age of Onset  . Asthma Other   . Diabetes Other     Social History:  reports that she has never smoked. She has never used smokeless tobacco. She reports that she does not drink alcohol or use illicit drugs.  Allergies:  Allergies  Allergen Reactions  . Carbapenems Other (See Comments)    unknown  . Cephalosporins Other (See Comments)    unknown  . Amoxicillin Rash  . Penicillins Rash    Medications: Prior to Admission medications   Medication Sig Start Date End Date Taking? Authorizing Provider  acetaminophen (TYLENOL) 325 MG tablet Take 650 mg by mouth every 6 (six) hours as needed for mild pain.     Historical Provider, MD  albuterol (PROVENTIL HFA;VENTOLIN HFA) 108 (90 BASE) MCG/ACT inhaler Inhale 2 puffs into the lungs every 6 (six) hours as needed for wheezing or shortness of breath.    Historical Provider, MD  allopurinol (ZYLOPRIM) 100 MG tablet TAKE (2) TABLETS BY MOUTH ONCE DAILY. 08/07/13   Mikey Kirschner, MD  alum & mag hydroxide-simeth [facility]) 200-200-20 MG/5ML suspension Take by mouth every 4 (four) hours as needed for indigestion or heartburn.    Historical Provider, MD  amLODipine (NORVASC) 5 MG tablet Take 1 tablet (5 mg total) by mouth daily. Patient taking differently: Take 10 mg by mouth daily.  04/19/14   Kathie Dike, MD  ASPIRIN LOW DOSE 81 MG EC tablet TAKE 1 TABLET BY MOUTH ONCE DAILY. 10/25/13   Mikey Kirschner, MD  benzonatate (TESSALON)  100 MG capsule Take 1 capsule (100 mg total) by mouth 3 (three) times daily as needed for cough. 04/19/14   Kathie Dike, MD  Cranberry 475 MG CAPS Take 1 capsule by mouth every 12 (twelve) hours.    Historical Provider, MD  Cranberry-Vitamin C-Inulin (UTI-STAT PO) Take 30 mLs by mouth daily.    Historical Provider, MD  Emollient (MOISTURIZING CREAM EX) Apply 1 application topically 2 (two) times daily.    Historical Provider, MD  fenofibrate 54 MG tablet Take 1 tablet (54 mg total) by mouth daily. 12/14/13   Radene Gunning, NP  guaiFENesin (MUCINEX) 600 MG 12 hr tablet Take 1 tablet (600 mg total) by mouth 2 (two) times daily. 04/19/14   Kathie Dike, MD  guaifenesin (TUSSIN) 100 MG/5ML syrup Take 200 mg by mouth every 6 (six) hours as needed for cough.    Historical Provider, MD  hydrOXYzine (ATARAX/VISTARIL) 25 MG tablet Take 12.5 mg by mouth every 6 (six) hours as needed for itching.    Historical Provider, MD  insulin glargine (  LANTUS) 100 UNIT/ML injection Inject 5 Units into the skin at bedtime.    Historical Provider, MD  ipratropium-albuterol (DUONEB) 0.5-2.5 (3) MG/3ML SOLN Take 3 mLs by nebulization every 4 (four) hours as needed (shortness of breath).    Historical Provider, MD  magnesium hydroxide (MILK OF MAGNESIA) 400 MG/5ML suspension Take 30 mLs by mouth daily as needed for mild constipation.    Historical Provider, MD  metoprolol (LOPRESSOR) 50 MG tablet TAKE 1 TABLET BY MOUTH TWICE DAILY. 11/08/13   Mikey Kirschner, MD  omeprazole (PRILOSEC) 20 MG capsule Take 20 mg by mouth daily.    Historical Provider, MD  oxybutynin (DITROPAN-XL) 5 MG 24 hr tablet Take 1 tablet (5 mg total) by mouth at bedtime. 09/06/13   Mikey Kirschner, MD  PARoxetine (PAXIL) 20 MG tablet Take 20 mg by mouth every morning.    Historical Provider, MD  Polyethyl Glycol-Propyl Glycol (SYSTANE OP) Place 1 drop into both eyes 2 (two) times daily.    Historical Provider, MD  potassium chloride SA (K-DUR,KLOR-CON) 20  MEQ tablet Take 20 mEq by mouth daily.    Historical Provider, MD  simvastatin (ZOCOR) 20 MG tablet TAKE 1 TABLET BY MOUTH AT BEDTIME. 11/08/13   Mikey Kirschner, MD  Spacer/Aero-Holding Chambers (AEROCHAMBER MV) inhaler Use as instructed. 2 puffs BID 09/28/13   Mikey Kirschner, MD  torsemide (DEMADEX) 20 MG tablet Take 80 mg by mouth daily. Take 4 tablets to equal 80 mg total  every morning and 60 mg every afternoon    Historical Provider, MD    Scheduled Meds: . allopurinol  200 mg Oral Daily  . amLODipine  5 mg Oral Daily  . aspirin  325 mg Oral Daily  . enoxaparin (LOVENOX) injection  30 mg Subcutaneous Q24H  . guaiFENesin  600 mg Oral BID  . insulin aspart  0-15 Units Subcutaneous TID WC  . insulin aspart  0-5 Units Subcutaneous QHS  . insulin glargine  5 Units Subcutaneous QHS  . metoprolol  50 mg Oral BID  . oxybutynin  5 mg Oral QHS  . pantoprazole  40 mg Oral Daily  . PARoxetine  20 mg Oral Daily  . polyvinyl alcohol  1 drop Both Eyes BID  . potassium chloride SA  20 mEq Oral Daily  . simvastatin  20 mg Oral QHS  . sodium chloride  3 mL Intravenous Q12H  . sodium chloride  3 mL Intravenous Q12H  . torsemide  80 mg Oral Daily   Continuous Infusions:  PRN Meds:.sodium chloride, acetaminophen **OR** acetaminophen, benzonatate, ipratropium-albuterol, magnesium hydroxide, ondansetron **OR** ondansetron (ZOFRAN) IV, sodium chloride     Results for orders placed or performed during the hospital encounter of 11/13/14 (from the past 48 hour(s))  Basic metabolic panel     Status: Abnormal   Collection Time: 11/13/14  1:24 PM  Result Value Ref Range   Sodium 137 135 - 145 mmol/L   Potassium 4.2 3.5 - 5.1 mmol/L   Chloride 105 96 - 112 mmol/L   CO2 23 19 - 32 mmol/L   Glucose, Bld 160 (H) 70 - 99 mg/dL   BUN 51 (H) 6 - 23 mg/dL   Creatinine, Ser 1.72 (H) 0.50 - 1.10 mg/dL   Calcium 9.1 8.4 - 10.5 mg/dL   GFR calc non Af Amer 25 (L) >90 mL/min   GFR calc Af Amer 29 (L) >90  mL/min    Comment: (NOTE) The eGFR has been calculated using the CKD EPI  equation. This calculation has not been validated in all clinical situations. eGFR's persistently <90 mL/min signify possible Chronic Kidney Disease.    Anion gap 9 5 - 15  CBC with Differential/Platelet     Status: Abnormal   Collection Time: 11/13/14  1:24 PM  Result Value Ref Range   WBC 9.0 4.0 - 10.5 K/uL   RBC 4.05 3.87 - 5.11 MIL/uL   Hemoglobin 11.6 (L) 12.0 - 15.0 g/dL   HCT 36.0 36.0 - 46.0 %   MCV 88.9 78.0 - 100.0 fL   MCH 28.6 26.0 - 34.0 pg   MCHC 32.2 30.0 - 36.0 g/dL   RDW 15.8 (H) 11.5 - 15.5 %   Platelets 201 150 - 400 K/uL   Neutrophils Relative % 67 43 - 77 %   Neutro Abs 6.0 1.7 - 7.7 K/uL   Lymphocytes Relative 18 12 - 46 %   Lymphs Abs 1.6 0.7 - 4.0 K/uL   Monocytes Relative 10 3 - 12 %   Monocytes Absolute 0.9 0.1 - 1.0 K/uL   Eosinophils Relative 5 0 - 5 %   Eosinophils Absolute 0.4 0.0 - 0.7 K/uL   Basophils Relative 0 0 - 1 %   Basophils Absolute 0.0 0.0 - 0.1 K/uL  Troponin I     Status: None   Collection Time: 11/13/14  1:24 PM  Result Value Ref Range   Troponin I <0.03 <0.031 ng/mL    Comment:        NO INDICATION OF MYOCARDIAL INJURY.   Ethanol     Status: None   Collection Time: 11/13/14  1:47 PM  Result Value Ref Range   Alcohol, Ethyl (B) <5 0 - 9 mg/dL    Comment:        LOWEST DETECTABLE LIMIT FOR SERUM ALCOHOL IS 11 mg/dL FOR MEDICAL PURPOSES ONLY   Protime-INR     Status: None   Collection Time: 11/13/14  1:48 PM  Result Value Ref Range   Prothrombin Time 13.9 11.6 - 15.2 seconds   INR 1.06 0.00 - 1.49  I-Stat CG4 Lactic Acid, ED     Status: None   Collection Time: 11/13/14  1:50 PM  Result Value Ref Range   Lactic Acid, Venous 1.32 0.5 - 2.0 mmol/L  Urinalysis, Routine w reflex microscopic     Status: Abnormal   Collection Time: 11/13/14  2:17 PM  Result Value Ref Range   Color, Urine YELLOW YELLOW   APPearance CLEAR CLEAR   Specific Gravity,  Urine 1.010 1.005 - 1.030   pH 5.5 5.0 - 8.0   Glucose, UA NEGATIVE NEGATIVE mg/dL   Hgb urine dipstick MODERATE (A) NEGATIVE   Bilirubin Urine NEGATIVE NEGATIVE   Ketones, ur NEGATIVE NEGATIVE mg/dL   Protein, ur NEGATIVE NEGATIVE mg/dL   Urobilinogen, UA 0.2 0.0 - 1.0 mg/dL   Nitrite NEGATIVE NEGATIVE   Leukocytes, UA NEGATIVE NEGATIVE  Urine microscopic-add on     Status: Abnormal   Collection Time: 11/13/14  2:17 PM  Result Value Ref Range   Squamous Epithelial / LPF RARE RARE   RBC / HPF 0-2 <3 RBC/hpf   Bacteria, UA FEW (A) RARE  CBC     Status: Abnormal   Collection Time: 11/13/14  7:02 PM  Result Value Ref Range   WBC 8.7 4.0 - 10.5 K/uL   RBC 3.98 3.87 - 5.11 MIL/uL   Hemoglobin 11.2 (L) 12.0 - 15.0 g/dL   HCT 35.2 (L) 36.0 - 46.0 %  MCV 88.4 78.0 - 100.0 fL   MCH 28.1 26.0 - 34.0 pg   MCHC 31.8 30.0 - 36.0 g/dL   RDW 15.8 (H) 11.5 - 15.5 %   Platelets 204 150 - 400 K/uL  Creatinine, serum     Status: Abnormal   Collection Time: 11/13/14  7:02 PM  Result Value Ref Range   Creatinine, Ser 1.60 (H) 0.50 - 1.10 mg/dL   GFR calc non Af Amer 27 (L) >90 mL/min   GFR calc Af Amer 32 (L) >90 mL/min    Comment: (NOTE) The eGFR has been calculated using the CKD EPI equation. This calculation has not been validated in all clinical situations. eGFR's persistently <90 mL/min signify possible Chronic Kidney Disease.   TSH     Status: None   Collection Time: 11/13/14  7:02 PM  Result Value Ref Range   TSH 1.593 0.350 - 4.500 uIU/mL  Troponin I     Status: None   Collection Time: 11/13/14  7:02 PM  Result Value Ref Range   Troponin I <0.03 <0.031 ng/mL    Comment:        NO INDICATION OF MYOCARDIAL INJURY.   Glucose, capillary     Status: Abnormal   Collection Time: 11/13/14 10:19 PM  Result Value Ref Range   Glucose-Capillary 204 (H) 70 - 99 mg/dL   Comment 1 Notify RN    Comment 2 Document in Chart   Troponin I     Status: None   Collection Time: 11/14/14  1:01  AM  Result Value Ref Range   Troponin I <0.03 <0.031 ng/mL    Comment:        NO INDICATION OF MYOCARDIAL INJURY.   Basic metabolic panel     Status: Abnormal   Collection Time: 11/14/14  7:05 AM  Result Value Ref Range   Sodium 140 135 - 145 mmol/L   Potassium 3.5 3.5 - 5.1 mmol/L   Chloride 108 96 - 112 mmol/L   CO2 25 19 - 32 mmol/L   Glucose, Bld 136 (H) 70 - 99 mg/dL   BUN 42 (H) 6 - 23 mg/dL   Creatinine, Ser 1.30 (H) 0.50 - 1.10 mg/dL   Calcium 8.9 8.4 - 10.5 mg/dL   GFR calc non Af Amer 35 (L) >90 mL/min   GFR calc Af Amer 41 (L) >90 mL/min    Comment: (NOTE) The eGFR has been calculated using the CKD EPI equation. This calculation has not been validated in all clinical situations. eGFR's persistently <90 mL/min signify possible Chronic Kidney Disease.    Anion gap 7 5 - 15  CBC     Status: Abnormal   Collection Time: 11/14/14  7:05 AM  Result Value Ref Range   WBC 8.2 4.0 - 10.5 K/uL   RBC 3.97 3.87 - 5.11 MIL/uL   Hemoglobin 11.2 (L) 12.0 - 15.0 g/dL   HCT 35.4 (L) 36.0 - 46.0 %   MCV 89.2 78.0 - 100.0 fL   MCH 28.2 26.0 - 34.0 pg   MCHC 31.6 30.0 - 36.0 g/dL   RDW 15.9 (H) 11.5 - 15.5 %   Platelets 181 150 - 400 K/uL  Troponin I     Status: None   Collection Time: 11/14/14  7:05 AM  Result Value Ref Range   Troponin I <0.03 <0.031 ng/mL    Comment:        NO INDICATION OF MYOCARDIAL INJURY.   Glucose, capillary     Status:  Abnormal   Collection Time: 11/14/14  7:30 AM  Result Value Ref Range   Glucose-Capillary 143 (H) 70 - 99 mg/dL   Comment 1 Notify RN     Studies/Results:  HEAD CT 1. No acute intracranial pathology. 2. Chronic microvascular disease and cerebral atrophy. 3. Ventriculomegaly which is commensurate with the degree cerebral atrophy, but can also be seen in normal pressure hydrocephalus.      Kalin Amrhein A. Merlene Hamilton, M.D.  Diplomate, Tax adviser of Psychiatry and Neurology ( Neurology). 11/14/2014, 8:50 AM

## 2014-11-14 NOTE — Clinical Social Work Psychosocial (Signed)
Clinical Social Work Department BRIEF PSYCHOSOCIAL ASSESSMENT 11/14/2014  Patient:  [name], [name]     Account Number:  [id number]     Admit date:  11/13/2014  Clinical Social Worker:  [name]  Date/Time:  11/14/2014 01:08 PM  Referred by:  CSW  Date Referred:  11/14/2014 Referred for  SNF Placement   Other Referral:   Interview type:  Family Other interview type:   Daughter, Designer, multimedia    PSYCHOSOCIAL DATA Living Status:  FACILITY Admitted from facility:  [facility] Level of care:  [place] Primary support name:  [name] Primary support relationship to patient:  CHILD, ADULT Degree of support available:   [name] is very supportive.    CURRENT CONCERNS Current Concerns  Post-Acute Placement   Other Concerns:    SOCIAL WORK ASSESSMENT / PLAN CSW met with patient and daughter, [name] who was at patient's bedside.  Patient greeted CSW and fell asleep. [name] indicated that patient has been a resident at [facility] for a year. She stated that prior to that patient was at [facility] for five months.  [name] indicated that patient's needed a higher level of care while at [facility] and the family chose to place patient in [facility] and private pay.  She indicated that patient utilizes a wheelchair and she feels that patient has given up on trying to walk.  She indicated that patient feeds herself and takes herself to the bathroom.  [name] indicated that patient receives assistance with other ADLs from [facility] staff.  She stated that patient is now refusing blood work and indicated that patient states that she is tired of all the medical testing being done.  [name] indicated she desires for patient to return to [facility] upon discharge from this hospitalization.  CSW spoke with Keri at [facility].  Keri confirmed [name] statements.  She advised that patient could return to the facility.  She indicated that patient  would not need an FL2 unless she came back under insurance days rather than private pay.   Assessment/plan status:  Information/Referral to [employer] Other assessment/ plan:   Information/referral to community resources:    PATIENT'S/FAMILY'S RESPONSE TO PLAN OF CARE: Patient plans to return to [facility] upon discharge.    [name], [place]

## 2014-11-14 NOTE — Progress Notes (Signed)
UR chart review completed.  

## 2014-11-15 ENCOUNTER — Inpatient Hospital Stay (HOSPITAL_COMMUNITY): Payer: Medicare Other

## 2014-11-15 ENCOUNTER — Inpatient Hospital Stay
Admission: RE | Admit: 2014-11-15 | Discharge: 2015-03-05 | Disposition: A | Discharge status: Other Acute Inpatient Hospital | Payer: Medicare Other | Source: Ambulatory Visit | Attending: Internal Medicine | Admitting: Internal Medicine

## 2014-11-15 DIAGNOSIS — R0602 Shortness of breath: Secondary | ICD-10-CM

## 2014-11-15 DIAGNOSIS — R05 Cough: Principal | ICD-10-CM

## 2014-11-15 DIAGNOSIS — R059 Cough, unspecified: Principal | ICD-10-CM

## 2014-11-15 DIAGNOSIS — R5383 Other fatigue: Secondary | ICD-10-CM

## 2014-11-15 LAB — GLUCOSE, CAPILLARY
Glucose-Capillary: 142 mg/dL — ABNORMAL HIGH (ref 70–99)
Glucose-Capillary: 171 mg/dL — ABNORMAL HIGH (ref 70–99)

## 2014-11-15 LAB — VITAMIN B12: Vitamin B-12: 337 pg/mL (ref 211–911)

## 2014-11-15 MED ORDER — ASPIRIN 325 MG PO TABS: 325.0000 mg | ORAL_TABLET | Freq: Every day | ORAL | Status: DC

## 2014-11-15 NOTE — Progress Notes (Signed)
Pt discharged to [facility] today per Dr. Roderic Palau. Pt's IV site D/C'd and WDL. Pt's VSS. Report called to nurse at [facility]. Verbalized understanding. Pt left floor via WC in stable condition accompanied by NT.

## 2014-11-15 NOTE — Clinical Social Work Note (Signed)
CSW facilitated discharge.    CSW notified facility that patient was being discharged today.  CSW notified patient's daughter, Fraser Din, who was at bedside that patient was being discharged today and would be transported to [facility] via nursing staff.  CSW signing off.   Ambrose Pancoast, [place]

## 2014-11-15 NOTE — Procedures (Signed)
[place] A. Merlene Laughter, MD     [email or web address]           HISTORY: The patient is a 81 year old as episodes of confusion worrisome for complex partial seizures.  MEDICATIONS: Scheduled Meds: Continuous Infusions: PRN Meds:.  Prior to Admission medications   Medication Sig Start Date End Date Taking? Authorizing Provider  acetaminophen (TYLENOL) 325 MG tablet Take 650 mg by mouth every 6 (six) hours as needed for mild pain.     Historical Provider, MD  albuterol (PROVENTIL HFA;VENTOLIN HFA) 108 (90 BASE) MCG/ACT inhaler Inhale 2 puffs into the lungs every 6 (six) hours as needed for wheezing or shortness of breath.    Historical Provider, MD  allopurinol (ZYLOPRIM) 100 MG tablet TAKE (2) TABLETS BY MOUTH ONCE DAILY. 08/07/13   Mikey Kirschner, MD  alum & mag hydroxide-simeth [facility]) 200-200-20 MG/5ML suspension Take by mouth every 4 (four) hours as needed for indigestion or heartburn.    Historical Provider, MD  amLODipine (NORVASC) 10 MG tablet Take 10 mg by mouth daily.    Historical Provider, MD  aspirin 325 MG tablet Take 1 tablet (325 mg total) by mouth daily. 11/15/14   Kathie Dike, MD  benzonatate (TESSALON) 100 MG capsule Take 1 capsule (100 mg total) by mouth 3 (three) times daily as needed for cough. 04/19/14   Kathie Dike, MD  Cranberry 475 MG CAPS Take 1 capsule by mouth every 12 (twelve) hours.    Historical Provider, MD  Cranberry-Vitamin C-Inulin (UTI-STAT) LIQD Take 30 mLs by mouth daily.    Historical Provider, MD  fenofibrate 54 MG tablet Take 1 tablet (54 mg total) by mouth daily. Patient not taking: Reported on 11/14/2014 12/14/13   Radene Gunning, NP  guaiFENesin (MUCINEX) 600 MG 12 hr tablet Take 1 tablet (600 mg total) by mouth 2 (two) times daily. 04/19/14   Kathie Dike, MD  guaifenesin (TUSSIN) 100 MG/5ML syrup Take 200 mg by mouth 2 (two) times daily as needed for cough.     Historical Provider, MD  hydrOXYzine (ATARAX/VISTARIL) 25 MG  tablet Take 12.5 mg by mouth every 6 (six) hours as needed for itching.    Historical Provider, MD  insulin glargine (LANTUS) 100 UNIT/ML injection Inject 5 Units into the skin at bedtime.    Historical Provider, MD  insulin lispro (HUMALOG) 100 UNIT/ML injection Inject 2-6 Units into the skin 3 (three) times daily before meals. Sliding scale as follows: 200-250=2units 251-300=4units 301-350=6units If CBG greater than 350 give 6 units and call Provider    Historical Provider, MD  ipratropium-albuterol (DUONEB) 0.5-2.5 (3) MG/3ML SOLN Take 3 mLs by nebulization every 4 (four) hours as needed (shortness of breath).    Historical Provider, MD  magnesium hydroxide (MILK OF MAGNESIA) 400 MG/5ML suspension Take 30 mLs by mouth daily as needed for mild constipation.    Historical Provider, MD  metoprolol (LOPRESSOR) 50 MG tablet TAKE 1 TABLET BY MOUTH TWICE DAILY. 11/08/13   Mikey Kirschner, MD  oxybutynin (DITROPAN-XL) 5 MG 24 hr tablet Take 1 tablet (5 mg total) by mouth at bedtime. Patient not taking: Reported on 11/14/2014 09/06/13   Mikey Kirschner, MD  PARoxetine (PAXIL) 30 MG tablet Take 30 mg by mouth daily.    Historical Provider, MD  Polyethyl Glycol-Propyl Glycol (SYSTANE OP) Place 1 drop into both eyes 2 (two) times daily.    Historical Provider, MD  potassium chloride SA (K-DUR,KLOR-CON) 20 MEQ tablet Take 40 mEq by mouth 2 (two)  times daily.     Historical Provider, MD  ranitidine (ZANTAC) 150 MG tablet Take 150 mg by mouth at bedtime.    Historical Provider, MD  simvastatin (ZOCOR) 10 MG tablet Take 10 mg by mouth at bedtime.    Historical Provider, MD  Spacer/Aero-Holding Chambers (AEROCHAMBER MV) inhaler Use as instructed. 2 puffs BID Patient not taking: Reported on 11/14/2014 09/28/13   Mikey Kirschner, MD  torsemide (DEMADEX) 20 MG tablet Take 60 mg by mouth 2 (two) times daily.     Historical Provider, MD      ANALYSIS: A 16 channel recording using standard 10 20 measurements is  conducted for 20 minutes. There is a well-formed posterior dominant rhythm and gets as high as 7-1/2 Hz which attenuates with eye opening. There is beta activity observed in the frontal areas. Awake and drowsy activities are observed. There are prominent vertex sharp waves seen especially over the left central region. Photic stimulation and hyperventilation were not carried out. There are no focal or lateral slowing. There is no epileptiform activity observed.   IMPRESSION: 1. This is a normal recording for age.      Glennette Galster A. Merlene Laughter, M.D.  Diplomate, Tax adviser of Psychiatry and Neurology ( Neurology).

## 2014-11-15 NOTE — Discharge Summary (Signed)
Physician Discharge Summary  [name] TML:[id number] DOB: 21-Aug-1922 DOA: 11/13/2014  PCP: Cyndee Brightly, MD  Admit date: 11/13/2014 Discharge date: 11/15/2014  Time spent: 40 minutes  Recommendations for Outpatient Follow-up:  1. Return to skilled nursing facility. 2. Follow-up with neurology in 3-4 weeks.  Discharge Diagnoses:  Principal Problem:   Acute encephalopathy Active Problems:   Essential hypertension, benign   Dyslipidemia   Type 2 diabetes with nephropathy   Chronic diastolic heart failure   Chronic kidney disease (CKD), stage IV (severe)   Encephalopathy Acute CVA Sinus bradycardia  Discharge Condition: stable  Diet recommendation: low salt, low carb  Filed Weights   11/13/14 1251 11/13/14 1650  Weight: 97.523 kg (215 lb) 96.525 kg (212 lb 12.8 oz)    History of present illness:  This patient was brought to the hospital after a change in her mental status. Patient is a resident of a skilled nursing facility and apparently had an episode where she started having blank stares and appeared "dazed". There was concern for an underlying CVA and therefore she was admitted for further treatments.  Hospital Course:  MRI of the brain indicated acute infarction in the left anterior body of the caudate. Patient refused any further workup including MRA head, carotid Dopplers, echocardiogram and any blood work including lipid panel and hemoglobin A1c. Her 81 mg of aspirin was increased to 325 mg. She was seen by neurology who also ordered EEG. This is been done with report currently pending. Patient was monitored on telemetry and was noted to be bradycardic with heart rates often going into the 40s. Her metoprolol was decreased from 50 mg twice a day to 25 mg twice a day. Urine was otherwise unremarkable and she did not have any signs of pneumonia on chest x-ray. She's been afebrile during her hospital stay and did not have any recurrent episodes. Case was  discussed with neurology and she is been cleared for discharge by the skilled nursing facility. Dr. Merlene Laughter will review the EEG and if any abnormalities, he will recommend starting on antiseizure medications. Patient will return to skilled nursing facility today.  Procedures:    Consultations:  Neurology  Discharge Exam: Filed Vitals:   11/15/14 0517  BP: 123/37  Pulse: 63  Temp: 98.9 F (37.2 C)  Resp: 20    General: NAD Cardiovascular: S1, S2 RRR Respiratory: CTA B  Discharge Instructions   Discharge Instructions    Diet - low sodium heart healthy    Complete by:  As directed      Diet Carb Modified    Complete by:  As directed      Increase activity slowly    Complete by:  As directed           Current Discharge Medication List    START taking these medications   Details  aspirin 325 MG tablet Take 1 tablet (325 mg total) by mouth daily. Qty: 30 tablet, Refills: 0      CONTINUE these medications which have NOT CHANGED   Details  allopurinol (ZYLOPRIM) 100 MG tablet TAKE (2) TABLETS BY MOUTH ONCE DAILY. Qty: 60 tablet, Refills: 5    amLODipine (NORVASC) 10 MG tablet Take 10 mg by mouth daily.    Cranberry 475 MG CAPS Take 1 capsule by mouth every 12 (twelve) hours.    Cranberry-Vitamin C-Inulin (UTI-STAT) LIQD Take 30 mLs by mouth daily.    guaiFENesin (MUCINEX) 600 MG 12 hr tablet Take 1 tablet (600 mg total) by  mouth 2 (two) times daily.    insulin lispro (HUMALOG) 100 UNIT/ML injection Inject 2-6 Units into the skin 3 (three) times daily before meals. Sliding scale as follows: 200-250=2units 251-300=4units 301-350=6units If CBG greater than 350 give 6 units and call Provider    metoprolol (LOPRESSOR) 50 MG tablet TAKE 1 TABLET BY MOUTH TWICE DAILY. Qty: 60 tablet, Refills: 5    PARoxetine (PAXIL) 30 MG tablet Take 30 mg by mouth daily.    potassium chloride SA (K-DUR,KLOR-CON) 20 MEQ tablet Take 40 mEq by mouth 2 (two) times daily.      ranitidine (ZANTAC) 150 MG tablet Take 150 mg by mouth at bedtime.    simvastatin (ZOCOR) 10 MG tablet Take 10 mg by mouth at bedtime.    torsemide (DEMADEX) 20 MG tablet Take 60 mg by mouth 2 (two) times daily.     acetaminophen (TYLENOL) 325 MG tablet Take 650 mg by mouth every 6 (six) hours as needed for mild pain.     albuterol (PROVENTIL HFA;VENTOLIN HFA) 108 (90 BASE) MCG/ACT inhaler Inhale 2 puffs into the lungs every 6 (six) hours as needed for wheezing or shortness of breath.    alum & mag hydroxide-simeth (MYLANTA) 200-200-20 MG/5ML suspension Take by mouth every 4 (four) hours as needed for indigestion or heartburn.    benzonatate (TESSALON) 100 MG capsule Take 1 capsule (100 mg total) by mouth 3 (three) times daily as needed for cough. Qty: 20 capsule, Refills: 0    fenofibrate 54 MG tablet Take 1 tablet (54 mg total) by mouth daily. Qty: 30 tablet, Refills: 0    guaifenesin (TUSSIN) 100 MG/5ML syrup Take 200 mg by mouth 2 (two) times daily as needed for cough.     hydrOXYzine (ATARAX/VISTARIL) 25 MG tablet Take 12.5 mg by mouth every 6 (six) hours as needed for itching.    insulin glargine (LANTUS) 100 UNIT/ML injection Inject 5 Units into the skin at bedtime.    ipratropium-albuterol (DUONEB) 0.5-2.5 (3) MG/3ML SOLN Take 3 mLs by nebulization every 4 (four) hours as needed (shortness of breath).    magnesium hydroxide (MILK OF MAGNESIA) 400 MG/5ML suspension Take 30 mLs by mouth daily as needed for mild constipation.    oxybutynin (DITROPAN-XL) 5 MG 24 hr tablet Take 1 tablet (5 mg total) by mouth at bedtime. Qty: 30 tablet, Refills: 0    Polyethyl Glycol-Propyl Glycol (SYSTANE OP) Place 1 drop into both eyes 2 (two) times daily.    Spacer/Aero-Holding Chambers (AEROCHAMBER MV) inhaler Use as instructed. 2 puffs BID Qty: 1 each, Refills: 0      STOP taking these medications     ASPIRIN LOW DOSE 81 MG EC tablet      omeprazole (PRILOSEC) 20 MG capsule         Allergies  Allergen Reactions  . Aleve [Naproxen Sodium]   . Carbapenems Other (See Comments)    unknown  . Cephalosporins Other (See Comments)    unknown  . Codeine Nausea Only  . Amoxicillin Rash  . Penicillins Rash   Follow-up Information    Follow up with Phillips Odor, MD On 12/15/2014.   Specialty:  Neurology   Why:  Appointment scheduled for 3:15 PM.   Contact information:   [address] [place] [address] [phone]        The results of significant diagnostics from this hospitalization (including imaging, microbiology, ancillary and laboratory) are listed below for reference.    Significant Diagnostic Studies: Ct Head Wo  Contrast  11/13/2014   CLINICAL DATA:  Code stroke, altered mental status  EXAM: CT HEAD WITHOUT CONTRAST  TECHNIQUE: Contiguous axial images were obtained from the base of the skull through the vertex without intravenous contrast.  COMPARISON:  04/03/2007  FINDINGS: Bilateral hearing aids are present with mild beam hardening artifact which partially obscures areas of the cerebellum and occipital lobes.  There is no evidence of mass effect, midline shift, or extra-axial fluid collections. There is no evidence of a space-occupying lesion or intracranial hemorrhage. There is no evidence of a cortical-based area of acute infarction. There is generalized cerebral atrophy. There is periventricular white matter low attenuation likely secondary to microangiopathy.  There is ventriculomegaly which is commensurate with the degree cerebral atrophy, but can also be seen in normal pressure hydrocephalus. The basal cisterns are patent.  Visualized portions of the orbits are unremarkable. The visualized portions of the paranasal sinuses and mastoid air cells are unremarkable. Cerebrovascular atherosclerotic calcifications are noted.  The osseous structures are unremarkable.  IMPRESSION: 1. No acute intracranial pathology. 2. Chronic microvascular disease and  cerebral atrophy. 3. Ventriculomegaly which is commensurate with the degree cerebral atrophy, but can also be seen in normal pressure hydrocephalus. These results were called by telephone at the time of interpretation on 11/13/2014 at 1:46 pm to Dr. Elnora Morrison , who verbally acknowledged these results.   Electronically Signed   By: Kathreen Devoid   On: 11/13/2014 13:46   Mr Brain Wo Contrast  11/14/2014   CLINICAL DATA:  Altered mental status. Confusion. Personal history of breast cancer.  EXAM: MRI HEAD WITHOUT CONTRAST  TECHNIQUE: Multiplanar, multiecho pulse sequences of the brain and surrounding structures were obtained without intravenous contrast.  COMPARISON:  Head CT 11/13/2014 and previous  FINDINGS: There is a background pattern of advanced generalized brain atrophy. There is chronic ventriculomegaly related to deep white matter volume loss. There are multiple old infarctions throughout the brain. Chronic small-vessel changes are present throughout the pons. There is an old inferior cerebellar infarction on the left. There are extensive chronic small vessel ischemic changes throughout the hemispheric deep and subcortical white matter. There are small cortical and subcortical infarctions in the right frontal region in the left occipital region. There are chronic small-vessel ischemic changes affecting the thalami, basal ganglia and major white matter tracts. There is an acute 4 mm infarction in the left anterior body of the caudate. No other acute infarction. No evidence of mass lesion, hemorrhage or extra-axial collection. No pituitary mass. No inflammatory sinus disease. No skull or skullbase lesion.  IMPRESSION: 4 mm acute infarction in the left anterior body of the caudate. No other acute infarction.  Background pattern of advanced brain atrophy and widespread chronic ischemic changes as outlined above.  Ventriculomegaly, consistent with central atrophy and in proportion to the degree of generalized  brain volume loss.   Electronically Signed   By: Nelson Chimes M.D.   On: 11/14/2014 08:57   Dg Chest Portable 1 View  11/13/2014   CLINICAL DATA:  Altered mental status.  Shoulder pain.  EXAM: PORTABLE CHEST - 1 VIEW  COMPARISON:  10/15/2014 and 09/17/2014  FINDINGS: Lungs are adequately inflated and demonstrate mild prominence of the perihilar markings suggesting minimal vascular congestion. There is mild stable cardiomegaly. There is no evidence of effusion. Calcified mediastinal and right hilar lymph nodes unchanged. Remainder of the exam is unchanged.  IMPRESSION: Mild stable cardiomegaly with evidence of minimal vascular congestion.   Electronically Signed  By: Marin Olp M.D.   On: 11/13/2014 15:15    Microbiology: Recent Results (from the past 240 hour(s))  Urine culture     Status: None   Collection Time: 11/13/14  2:17 PM  Result Value Ref Range Status   Specimen Description URINE, CATHETERIZED  Final   Special Requests NONE  Final   Colony Count NO GROWTH Performed at [facility]   Final   Culture NO GROWTH Performed at [facility]   Final   Report Status 11/14/2014 FINAL  Final     Labs: Basic Metabolic Panel:  Recent Labs Lab 11/09/14 0720 11/13/14 1324 11/13/14 1902 11/14/14 0705  NA 138 137  --  140  K 3.2* 4.2  --  3.5  CL 105 105  --  108  CO2 26 23  --  25  GLUCOSE 140* 160*  --  136*  BUN 48* 51*  --  42*  CREATININE 1.66* 1.72* 1.60* 1.30*  CALCIUM 8.6 9.1  --  8.9   Liver Function Tests: No results for input(s): AST, ALT, ALKPHOS, BILITOT, PROT, ALBUMIN in the last 168 hours. No results for input(s): LIPASE, AMYLASE in the last 168 hours. No results for input(s): AMMONIA in the last 168 hours. CBC:  Recent Labs Lab 11/13/14 1324 11/13/14 1902 11/14/14 0705  WBC 9.0 8.7 8.2  NEUTROABS 6.0  --   --   HGB 11.6* 11.2* 11.2*  HCT 36.0 35.2* 35.4*  MCV 88.9 88.4 89.2  PLT 201 204 181   Cardiac Enzymes:  Recent Labs Lab  11/13/14 1324 11/13/14 1902 11/14/14 0101 11/14/14 0705  TROPONINI <0.03 <0.03 <0.03 <0.03   BNP: BNP (last 3 results) No results for input(s): BNP in the last 8760 hours.  ProBNP (last 3 results)  Recent Labs  12/10/13 0928 04/15/14 0025  PROBNP 856.7* 2691.0*    CBG:  Recent Labs Lab 11/14/14 1115 11/14/14 1644 11/14/14 2049 11/15/14 0735 11/15/14 1155  GLUCAP 192* 118* 200* 142* 171*       Signed:  Braxston Quinter  [facility] 11/15/2014, 1:57 PM

## 2014-11-16 ENCOUNTER — Non-Acute Institutional Stay (SKILLED_NURSING_FACILITY): Payer: Medicare Other | Admitting: Internal Medicine

## 2014-11-16 DIAGNOSIS — I5032 Chronic diastolic (congestive) heart failure: Secondary | ICD-10-CM | POA: Diagnosis not present

## 2014-11-16 DIAGNOSIS — I6990 Unspecified sequelae of unspecified cerebrovascular disease: Secondary | ICD-10-CM

## 2014-11-16 DIAGNOSIS — N184 Chronic kidney disease, stage 4 (severe): Secondary | ICD-10-CM | POA: Diagnosis not present

## 2014-11-16 NOTE — Progress Notes (Signed)
UR chart review completed.  

## 2014-11-20 NOTE — Progress Notes (Addendum)
Patient ID: [name], female   DOB: [birth year], 81 y.o.   MRN: [id number]                 HISTORY & PHYSICAL  DATE:  11/16/2014             FACILITY: [facility]                             LEVEL OF CARE:   SNF   CHIEF COMPLAINT:  Readmission to the facility, post stay at [facility], 11/13/2014 through 11/15/2014.                          HISTORY OF PRESENT ILLNESS:  This is a patient who is a longstanding resident in the facility, dating back to April of last year.    I had actually seen her when I was in the building on Sunday.  She had initially gotten up her usual self, ate breakfast, and then later in the day was observed, I think, by her family to have altered LOC.  I examined her and did not really see an obvious cause at the bedside or after careful exam, review of her medication list, etc.  She was sent to the hospital.  An MRI of the brain showed an acute infarction in the left inferior body of the caudate.  The family refused any further work-up and even the patient, I think, had said she did not want any work-up.  Her aspirin 81 mg was increased to 325.  Neurology saw the patient, picking up on a two-day history of "staring off into space", although I never did hear anything about this.  Her EEG was normal.    It was noted that she was bradycardic with heart rates in the 40s.  Her metoprolol was decreased from 50 twice a day to 25 twice a day.  Her urine was unremarkable.  Chest x-ray was clear.    The patient has been in the building since April 2015.  She was admitted at that point with, I believe, COPD acute.    She also has diastolic heart failure, pulmonary hypertension.  We had recently had problems with increasing edema and I had to increase her diuretics a fair amount, although we seem to have gotten that mostly under control.    PAST MEDICAL HISTORY/PROBLEM LIST:                            Osteoarthritis.     Lower extremity weakness,  essentially nonambulatory.    Community-acquired pneumonia.    History of breast cancer, status post left mastectomy.    Hypertension.    Type 2 diabetes.     Chronic COPD.    Carpal tunnel syndrome.    Stress urinary incontinence.    Dysphagia.    Acute-on-chronic diastolic heart failure.    Chronic renal failure with a baseline creatinine of around 1.6.      CURRENT MEDICATIONS:  Discharge medications on return from the hospital:    Enteric-coated aspirin 325 q.d.     Zyloprim 100 q.d. for gout.    Norvasc 10 q.d.      Cranberry q.12.     Guaifenesin 600 b.i.d.           Humalog sliding scale.      Metoprolol 25 b.i.d.  Paxil 30 q.d.       Potassium 40 b.i.d.         Zantac 150 q.d.          Demadex 60 b.i.d.        Proventil 2 puffs q.6 hours p.r.n.         Tylenol 650 q.6 p.r.n.         Tessalon three times daily p.r.n.       Hydroxyzine 12.5 q.6 p.r.n. pruritus.     Lantus 5 U at bedtime.                                                                                                                 SOCIAL HISTORY:                   HOUSING:  The patient, I believe was previously at an assisted living.   ADVANCED DIRECTIVES:  She does not have any advanced directives.   FUNCTIONAL STATUS:  She spends most of her time in a wheelchair now.  I think she can stand to transfer, but I may even be wrong about that.    REVIEW OF SYSTEMS:            GENERAL:  The patient does not remember anything from Sunday.   CHEST/RESPIRATORY:  She does not complain of cough or shortness of breath.     CARDIAC:  No chest pain.    GI:  No abdominal pain.   NEUROLOGICAL:  No headache.    MUSCULOSKELETAL:  She is complaining of right shoulder weakness and pain.    PHYSICAL EXAMINATION:   VITAL SIGNS:     PULSE:  In the 40s, 48 and regular.     RESPIRATIONS:  18 and unlabored.   02 SATURATIONS:  95% on room air.   GENERAL APPEARANCE:  The patient appears her  normal self.   CHEST/RESPIRATORY:  Surprisingly, again, clear air entry bilaterally.     CARDIOVASCULAR:   CARDIAC:  Heart sounds are normal.  I do not hear mitral stenosis findings, although I have never been able to in her.   EDEMA/VARICOSITIES:  Extremities:  She has edema, some degree of venous insufficiency.  No clear signs of congestive heart failure.   GASTROINTESTINAL:   ABDOMEN:  No masses.  There is obesity, but no tenderness.     LIVER/SPLEEN/KIDNEY:   No liver, no spleen.           MUSCULOSKELETAL:    EXTREMITIES:   RIGHT UPPER EXTREMITY:  No focal tenderness in the right shoulder.    BILATERAL LOWER EXTREMITIES:  She has significant osteoarthritis of both knees.   NEUROLOGICAL:          DEEP TENDON REFLEXES:  She is diffusely hyporeflexic, probably secondary to diabetic neuropathy.   SENSATION/STRENGTH:   She appears to have right arm weakness, although this may relate to shoulder pain more than anything else.  I cannot detect any other lateralizing findings.  She has anti-gravity  strength in her legs.     ASSESSMENT/PLAN:                               Acute CVA in the left caudate.  This may account for some weakness of the right arm.  It does not well account for presenting with delirium but, nevertheless, there were other findings.    Chronic right greater than left diastolically-mediated heart failure, pulmonary hypertension, mitral stenosis.  This does not appear to be unstable.  We will need to follow up on her lab work next week.    Chronic renal insufficiency stage IV.  Discharge lab work from the hospital revealed a potassium of 3.1, BUN of 42, and creatinine of 1.3.  We have had problems with hypokalemia here.  I will recheck this.    Sinus bradycardia.  I think her metoprolol is going to need to be reduced further, perhaps even stopped.

## 2014-11-21 ENCOUNTER — Encounter (HOSPITAL_COMMUNITY)
Admission: RE | Admit: 2014-11-21 | Discharge: 2014-11-21 | Disposition: A | Discharge status: Home / Self Care | Payer: Medicare Other | Source: Skilled Nursing Facility | Attending: Internal Medicine | Admitting: Internal Medicine

## 2014-11-21 DIAGNOSIS — I10 Essential (primary) hypertension: Secondary | ICD-10-CM | POA: Diagnosis present

## 2014-11-21 DIAGNOSIS — I5032 Chronic diastolic (congestive) heart failure: Secondary | ICD-10-CM | POA: Diagnosis not present

## 2014-11-21 DIAGNOSIS — E1121 Type 2 diabetes mellitus with diabetic nephropathy: Secondary | ICD-10-CM | POA: Diagnosis not present

## 2014-11-21 LAB — BASIC METABOLIC PANEL
ANION GAP: 9 (ref 5–15)
BUN: 39 mg/dL — ABNORMAL HIGH (ref 6–23)
CALCIUM: 8.8 mg/dL (ref 8.4–10.5)
CO2: 27 mmol/L (ref 19–32)
CREATININE: 1.28 mg/dL — AB (ref 0.50–1.10)
Chloride: 105 mmol/L (ref 96–112)
GFR, EST AFRICAN AMERICAN: 41 mL/min — AB (ref >90)
GFR, EST NON AFRICAN AMERICAN: 36 mL/min — AB (ref >90)
Glucose, Bld: 147 mg/dL — ABNORMAL HIGH (ref 70–99)
POTASSIUM: 3.7 mmol/L (ref 3.5–5.1)
SODIUM: 141 mmol/L (ref 135–145)

## 2014-11-24 ENCOUNTER — Encounter (HOSPITAL_COMMUNITY)
Admission: RE | Admit: 2014-11-24 | Discharge: 2014-11-24 | Disposition: A | Discharge status: Home / Self Care | Payer: Medicare Other | Source: Skilled Nursing Facility | Attending: Internal Medicine | Admitting: Internal Medicine

## 2014-11-24 LAB — BASIC METABOLIC PANEL
Anion gap: 10 (ref 5–15)
BUN: 37 mg/dL — ABNORMAL HIGH (ref 6–23)
CHLORIDE: 101 mmol/L (ref 96–112)
CO2: 28 mmol/L (ref 19–32)
Calcium: 9.5 mg/dL (ref 8.4–10.5)
Creatinine, Ser: 1.28 mg/dL — ABNORMAL HIGH (ref 0.50–1.10)
GFR calc Af Amer: 41 mL/min — ABNORMAL LOW (ref >90)
GFR calc non Af Amer: 36 mL/min — ABNORMAL LOW (ref >90)
GLUCOSE: 155 mg/dL — AB (ref 70–99)
POTASSIUM: 3.7 mmol/L (ref 3.5–5.1)
SODIUM: 139 mmol/L (ref 135–145)

## 2014-12-04 ENCOUNTER — Encounter (HOSPITAL_COMMUNITY)
Admission: AD | Admit: 2014-12-04 | Discharge: 2014-12-04 | Disposition: A | Discharge status: Home / Self Care | Payer: Medicare Other | Source: Skilled Nursing Facility | Attending: Internal Medicine | Admitting: Internal Medicine

## 2014-12-04 DIAGNOSIS — I10 Essential (primary) hypertension: Secondary | ICD-10-CM | POA: Insufficient documentation

## 2014-12-04 DIAGNOSIS — E1121 Type 2 diabetes mellitus with diabetic nephropathy: Secondary | ICD-10-CM | POA: Insufficient documentation

## 2014-12-04 DIAGNOSIS — I5032 Chronic diastolic (congestive) heart failure: Secondary | ICD-10-CM | POA: Insufficient documentation

## 2014-12-04 LAB — URINALYSIS, ROUTINE W REFLEX MICROSCOPIC
Bilirubin Urine: NEGATIVE
Glucose, UA: NEGATIVE mg/dL
KETONES UR: NEGATIVE mg/dL
Nitrite: POSITIVE — AB
Protein, ur: NEGATIVE mg/dL
SPECIFIC GRAVITY, URINE: 1.015 (ref 1.005–1.030)
Urobilinogen, UA: 0.2 mg/dL (ref 0.0–1.0)
pH: 6 (ref 5.0–8.0)

## 2014-12-04 LAB — URINE MICROSCOPIC-ADD ON

## 2014-12-07 LAB — URINE CULTURE: Colony Count: >=100000

## 2014-12-16 ENCOUNTER — Encounter (HOSPITAL_COMMUNITY)
Admission: RE | Admit: 2014-12-16 | Discharge: 2014-12-16 | Disposition: A | Discharge status: Home / Self Care | Payer: Medicare Other | Source: Ambulatory Visit | Attending: Internal Medicine | Admitting: Internal Medicine

## 2014-12-16 ENCOUNTER — Non-Acute Institutional Stay: Payer: Medicare Other | Admitting: Internal Medicine

## 2014-12-16 ENCOUNTER — Inpatient Hospital Stay (HOSPITAL_COMMUNITY): Payer: Medicare Other | Attending: Internal Medicine

## 2014-12-16 DIAGNOSIS — R531 Weakness: Secondary | ICD-10-CM | POA: Diagnosis not present

## 2014-12-16 DIAGNOSIS — J441 Chronic obstructive pulmonary disease with (acute) exacerbation: Secondary | ICD-10-CM | POA: Diagnosis not present

## 2014-12-16 DIAGNOSIS — J309 Allergic rhinitis, unspecified: Secondary | ICD-10-CM

## 2014-12-16 DIAGNOSIS — N184 Chronic kidney disease, stage 4 (severe): Secondary | ICD-10-CM | POA: Diagnosis not present

## 2014-12-16 LAB — COMPREHENSIVE METABOLIC PANEL
ALT: 13 U/L (ref 0–35)
AST: 25 U/L (ref 0–37)
Albumin: 3.4 g/dL — ABNORMAL LOW (ref 3.5–5.2)
Alkaline Phosphatase: 100 U/L (ref 39–117)
Anion gap: 10 (ref 5–15)
BILIRUBIN TOTAL: 0.8 mg/dL (ref 0.3–1.2)
BUN: 30 mg/dL — ABNORMAL HIGH (ref 6–23)
CO2: 27 mmol/L (ref 19–32)
Calcium: 8.9 mg/dL (ref 8.4–10.5)
Chloride: 102 mmol/L (ref 96–112)
Creatinine, Ser: 1.21 mg/dL — ABNORMAL HIGH (ref 0.50–1.10)
GFR, EST AFRICAN AMERICAN: 44 mL/min — AB (ref >90)
GFR, EST NON AFRICAN AMERICAN: 38 mL/min — AB (ref >90)
Glucose, Bld: 158 mg/dL — ABNORMAL HIGH (ref 70–99)
Potassium: 3.7 mmol/L (ref 3.5–5.1)
SODIUM: 139 mmol/L (ref 135–145)
TOTAL PROTEIN: 7.7 g/dL (ref 6.0–8.3)

## 2014-12-16 NOTE — Progress Notes (Signed)
Patient ID: [name], female   DOB: [birth year], 81 y.o.   MRN: [id number]           Is an acute visit             FACILITY: [facility]:   SNF   CHIEF COMPLAINT:  Acute visit secondary to sore throat-chest congestion-allergy symptoms.                          HISTORY OF PRESENT ILLNESS:  This is a patient who is a longstanding resident in the facility, dating back to April of last year.     Had a short hospitalization last month for suspected CVA.  An MRI of the brain showed an acute infarction in the left inferior body of the caudate.  The family refused any further work-up and even the patient, I think, had said she did not want any work-up.  Her aspirin 81 mg was increased to 325.  Neurology saw the patient, picking up on a two-day history of "staring off into space", a  Her EEG was normal.    It was noted that she was bradycardic with heart rates in the 40s.  Her metoprolol was decreased from 50 twice a day to 25 twice a day.  Her urine was unremarkable.  Chest x-ray was clear.    The patient has been in the building since April 2015.  She was admitted at that point with, I believe, COPD acute--.    She also has diastolic heart failure, pulmonary hypertension.    has been relatively stable however the past day or so according to her daughter she has had some changes appears to be just not feeling well complains of a sore throat and appears she's had some chest congestion.  Her vital signs appear to be stable she is saturating in the high 90s  I note she is on duo nebs every 4 hours when necessary also Mucinex routinely twice a day also has a Proventil inhaler every 6 hours when necessary.  Marland Kitchen    PAST MEDICAL HISTORY/PROBLEM LIST:                            Osteoarthritis.     Lower extremity weakness, essentially nonambulatory.    Community-acquired pneumonia.    History of breast cancer, status post left  mastectomy.    Hypertension.    Type 2 diabetes.     Chronic COPD.    Carpal tunnel syndrome.    Stress urinary incontinence.    Dysphagia.    Acute-on-chronic diastolic heart failure.    Chronic renal failure with a baseline creatinine of around 1.6.      CURRENT MEDICATIONS:  Discharge medications on return from the hospital:    Enteric-coated aspirin 325 q.d.     Zyloprim 100 q.d. for gout.    Norvasc 10 q.d.      Cranberry q.12.     Guaifenesin 600 b.i.d.           Humalog sliding scale.      Metoprolol 12.5  b.i.d.       Paxil 30 q.d.       Potassium 40 b.i.d.  Zantac 150 q.d.          Demadex 60 b.i.d.        Proventil 2 puffs q.6 hours p.r.n.         Tylenol 650 q.6 p.r.n.         Tessalon three times daily p.r.n.       Hydroxyzine 12.5 q.6 p.r.n. pruritus.     Lantus 5 U at bedtime.                                                                                                                 SOCIAL HISTORY:                   HOUSING:  The patient, I believe was previously at an assisted living.   ADVANCED DIRECTIVES:  She does not have any advanced directives.   FUNCTIONAL STATUS:  She spends most of her time in a wheelchair now.  I    REVIEW OF SYSTEMS:             GENERAL:  In general is not complaining of fever chills but just says she doesn't feel good.  Skin does not complain of rashes or itching.  Head ears eyes nose mouth and throat is complaining of a sore throat possibly some eye drainage.  .   CHEST/RESPIRATORY:  She does not complain of cough or shortness of breath.--But I have noticed some congestion     CARDIAC:  No chest pain.    GI:  No abdominal pain.   NEUROLOGICAL:  No headache. Dizziness    MUSCULOSKELETAL:  Does not complain of joint pain currently.  Neurologic is not complaining of dizziness or headache does appear to be somewhat weak appearing.  Psych she does have some history of dementia can be a poor  historian.    PHYSICAL EXAMINATION:    Temperature 98.2 pulse 85 respirations 20 blood pressure 134/72 O2 saturation 97%    GENERAL APPEARANCE: She is sitting comfortably in a wheelchair but appears to be somewhat weak and tired.  Her skin is warm and dry.  Eyes appears to have possibly some clear drainage visible acuity appears intact pupils are reactive.  Oropharynx is clear appears to have some clear drainage I do not really note significantly increased erythema.  This was somewhat limited patient would not open her mouth very wide.  Chest she does have some diffuse rhonchi wheezing there is no labored breathing somewhat poor effort.  Heart is regular rate and rhythm with frequent irregular beats she has 2+ lower extremity edema bilaterally.  Abdomen is obese soft does not appear to be tender.  GU cannot appreciate any overt suprapubic tenderness.  Musculoskeletal and she is largelyambulatesi in wheelchair which is her baseline did not appreciate any new focal deficits.  Neurologic as noted above her speech is clear she does appear to be somewhat lethargic but easily arousable and talking.--I could not really pick up on any lateralizing findings  Psych again she appears to have  some element of dementia and confusion which is not new    Labs.  11/24/2014.  Sodium 139 potassium 3.7 BUN 37 creatinine 1.28.  10/15/2014.  WBC 7.9 hemoglobin 11.2 platelets 202 .      ASSESSMENT/PLAN:     General weakness-with chest congestion sore throat-one would suspect possibly a respiratory issue here she is quite fragile this regards-also appears to have some element of allergic rhinitis.    With her respiratory history--COPD-- will check a chest x-ray-also will empirically start her on Levaquin 500 milligrams a day for 1 day 250 mg a day for 9 additional days secondary to concerns of possibly some bronchitis or pneumonia.    Also will start Claritin for short course 5 days  secondary to concerns of allergic rhinitis.    With her history of wheezing and respiratory failure Will start prednisone 30 mg for 2 days 20 mg for 2 days and 10 mg for 2 days.    Also will update lab work secondary to her weakness mild lethargy will check a CBC and metabolic panel-also will check a urinalysis and culture.    She will have to be monitored closely with vital signs pulse ox every shift for 48 hours.    Also will make her duo nebs routine every 6 hours for 48 hours and then back to when necessary                Chronic right greater than left diastolically-mediated heart failure, pulmonary hypertension, mitral stenosis.  As noted above I do note she is on Demadex 600 mg twice a day    Chronic renal insufficiency stage IV.--Again we will update her lab    [id number]-of note greater than 40 minutes spent assessing patient-reviewing her chart-discussing her status with nursing staff as well as with her daughter in the room-and coordinating and formulating a plan of care-of note greater than 50% of time spent coordinating plan of care         .

## 2014-12-17 ENCOUNTER — Other Ambulatory Visit (HOSPITAL_COMMUNITY)
Admission: RE | Admit: 2014-12-17 | Discharge: 2014-12-17 | Disposition: A | Discharge status: Home / Self Care | Payer: Medicare Other | Source: Skilled Nursing Facility | Attending: Internal Medicine | Admitting: Internal Medicine

## 2014-12-17 DIAGNOSIS — N184 Chronic kidney disease, stage 4 (severe): Secondary | ICD-10-CM | POA: Diagnosis present

## 2014-12-17 LAB — CBC WITH DIFFERENTIAL/PLATELET
Basophils Absolute: 0.1 10*3/uL (ref 0.0–0.1)
Basophils Relative: 1 % (ref 0–1)
EOS ABS: 0.5 10*3/uL (ref 0.0–0.7)
Eosinophils Relative: 4 % (ref 0–5)
HCT: 35.7 % — ABNORMAL LOW (ref 36.0–46.0)
Hemoglobin: 11.3 g/dL — ABNORMAL LOW (ref 12.0–15.0)
LYMPHS PCT: 12 % (ref 12–46)
Lymphs Abs: 1.3 10*3/uL (ref 0.7–4.0)
MCH: 28.3 pg (ref 26.0–34.0)
MCHC: 31.7 g/dL (ref 30.0–36.0)
MCV: 89.3 fL (ref 78.0–100.0)
Monocytes Absolute: 1.1 10*3/uL — ABNORMAL HIGH (ref 0.1–1.0)
Monocytes Relative: 11 % (ref 3–12)
Neutro Abs: 7.6 10*3/uL (ref 1.7–7.7)
Neutrophils Relative %: 72 % (ref 43–77)
PLATELETS: 226 10*3/uL (ref 150–400)
RBC: 4 MIL/uL (ref 3.87–5.11)
RDW: 15.2 % (ref 11.5–15.5)
WBC: 10.5 10*3/uL (ref 4.0–10.5)

## 2014-12-17 LAB — URINE MICROSCOPIC-ADD ON

## 2014-12-17 LAB — URINALYSIS, ROUTINE W REFLEX MICROSCOPIC
Bilirubin Urine: NEGATIVE
Glucose, UA: NEGATIVE mg/dL
Ketones, ur: NEGATIVE mg/dL
Nitrite: NEGATIVE
PH: 5.5 (ref 5.0–8.0)
Protein, ur: NEGATIVE mg/dL
Specific Gravity, Urine: 1.01 (ref 1.005–1.030)
Urobilinogen, UA: 0.2 mg/dL (ref 0.0–1.0)

## 2014-12-18 ENCOUNTER — Encounter: Payer: Self-pay | Admitting: Internal Medicine

## 2014-12-18 DIAGNOSIS — J309 Allergic rhinitis, unspecified: Secondary | ICD-10-CM | POA: Insufficient documentation

## 2014-12-19 LAB — URINE CULTURE

## 2014-12-23 ENCOUNTER — Encounter: Payer: Self-pay | Admitting: Internal Medicine

## 2014-12-23 ENCOUNTER — Non-Acute Institutional Stay: Payer: Medicare Other | Admitting: Internal Medicine

## 2014-12-23 DIAGNOSIS — H6121 Impacted cerumen, right ear: Secondary | ICD-10-CM | POA: Insufficient documentation

## 2014-12-23 DIAGNOSIS — I10 Essential (primary) hypertension: Secondary | ICD-10-CM | POA: Diagnosis not present

## 2014-12-23 DIAGNOSIS — J441 Chronic obstructive pulmonary disease with (acute) exacerbation: Secondary | ICD-10-CM | POA: Diagnosis not present

## 2014-12-23 NOTE — Progress Notes (Signed)
Patient ID: [name], female   DOB: [birth year], 81 y.o.   MRN: [id number]           This is an acute visit             FACILITY: [facility]:   SNF   CHIEF COMPLAINT:  Acute visit secondary to follow-up chest congestion hypertension-acute visit secondary to right ear discomfort-                          HISTORY OF PRESENT ILLNESS:  This is a patient who is a longstanding resident in the facility, dating back to April of last year.   Hospitalized for was thought to be a small CVA her aspirin was increased to 325 mg a day family patient did not want any aggressive workup   .    The patient has been in the building since April 2015.  She was admitted at that point with, I believe, COPD acute--.    She also has diastolic heart failure, pulmonary hypertension.   I saw her last weekfor some increased chest congestion with concerns about COPD exasperation-however she appears to be significantly improved she did receive a prednisone taper-also a course of Levaquin  I note she is on duo nebs every 4 hours when necessary also Mucinex routinely twice a day also has a Proventil inhaler every 6 hours when necessary  Apparently earlier today she was complaining of some right ear discomfort although she currently denies it at the moment but says she did have it earlier today  I also note per chart review she has variable blood pressure readings she is on Norvasc 10 mg a day-in addition to the Demadex  The last listed blood pressure see is 179/74 a also see 173/71-138/73 128/70 there is quite a bit of variability I did take it manually this evening and got 118/58  .  Marland Kitchen    PAST MEDICAL HISTORY/PROBLEM LIST:                            Osteoarthritis.     Lower extremity weakness, essentially nonambulatory.    Community-acquired pneumonia.    History of breast cancer, status post left mastectomy.    Hypertension.    Type  2 diabetes.     Chronic COPD.    Carpal tunnel syndrome.    Stress urinary incontinence.    Dysphagia.    Acute-on-chronic diastolic heart failure.    Chronic renal failure with a baseline creatinine of around 1.6.      CURRENT MEDICATIONS:  Discharge medications on return from the hospital:    Enteric-coated aspirin 325 q.d.     Zyloprim 100 q.d. for gout.    Norvasc 10 q.d.      Cranberry q.12.     Guaifenesin 600 b.i.d.           Humalog sliding scale.      Metoprolol 12.5  b.i.d.       Paxil 30 q.d.       Potassium 40 b.i.d.         Zantac 150 q.d.          Demadex 60 b.i.d.        Proventil  2 puffs q.6 hours p.r.n.         Tylenol 650 q.6 p.r.n.         Tessalon three times daily p.r.n.       Hydroxyzine 12.5 q.6 p.r.n. pruritus.     Lantus 5 U at bedtime.                                                                                                                 SOCIAL HISTORY:                   HOUSING:  The patient, I believe was previously at an assisted living.   ADVANCED DIRECTIVES:  She does not have any advanced directives.   FUNCTIONAL STATUS:  She spends most of her time in a wheelchair now.  I    REVIEW OF SYSTEMS:             GENERAL:  In general is not complaining of fever chills appears to be feeling better than when I saw her last week  Skin does not complain of rashes or itching.  Head ears eyes nose mouth and throat --is not complaining of a sore throat today previously did complain of some right ear pain.  .   CHEST/RESPIRATORY:  She does not complain of cough or shortness of breath.-     CARDIAC:  No chest pain.    GI:  No abdominal pain.   NEUROLOGICAL:  No headache. Dizziness    MUSCULOSKELETAL:  Does not complain of joint pain currently.  Neurologic is not complaining of dizziness or headache does appear to be somewhat weak appearing.  Psych she does have some history of dementia can be a poor historian.    PHYSICAL  EXAMINATION:       Temperature 97.8 pulse 80 respirations 22 blood pressure taken manually 118/58  weightrelatively stable per chart review at 14.4 GENERAL APPEARANCE: She is sitting comfortably in her recliner. No sign whatsoever of distress  Her skin is warm and dry.  Eyes sclera and conjunctiva appear clear.  Oropharynx is clear mucous membranes are moist  Ears-I do note some mild wax buildup bilaterally-right ear did not note any drainage or erythema in the ear canal-there was no tenderness to palpation of the area. Tympanic membranes bilaterally were somewhat difficult to visualize  .  Chest --has some mild expiratory wheezing posterior fields this is significantly improved from last week--there is no labored breathing  Heart is regular rate and rhythm with frequent irregular beats she has 2+ lower extremity edema bilaterally.  Abdomen is obese soft does not appear to be tender.  GU cannot appreciate any overt suprapubic tenderness.  Musculoskeletal and she  largelyambulatesi in wheelchair which is her baseline did not appreciate any new focal deficits.  Neurologic as noted above her speech is clear she is more alert and talkative tonight than she was when I saw her last week   Psych again she appears to have some element of dementia and confusion which  is not new    Labs.  12/17/2014.  WBC 10.5 hemoglobin 11.3 platelets 226  12/16/2014.  Sodium 139 potassium 3.7 BUN 30 creatinine 1.21.  CO2 27-.  Liver function tests within normal limits except albumin of 3.4.    11/24/2014.  Sodium 139 potassium 3.7 BUN 37 creatinine 1.28.  10/15/2014.  WBC 7.9 hemoglobin 11.2 platelets 202 .      ASSESSMENT/PLAN:    History of right ear pain-exam was quite benign she does appear to have some wax buildup will treat this with D Brox and monitor   Hypertension-variable blood pressure readings-one would wonder about machine variation-patient also has a large arm and  I suspect needs a large blood pressure cuff --taken manually with a large cuff blood pressure appears to be satisfactory        History COPD exasperation-this appears stable as noted above appears to be breathing normally less chest congestion afebrile         Chronic right greater than left diastolically-mediated heart failure, pulmonary hypertension, mitral stenosis.  I do note she is on Demadex 60 mg twice a day--this is stable weights appear to be stable    Chronic renal insufficiency stage IV.--Stable per recent lab with a creatinine of 1.21 BUN of 30      [id number]    .

## 2015-02-12 ENCOUNTER — Other Ambulatory Visit (HOSPITAL_COMMUNITY)
Admission: RE | Admit: 2015-02-12 | Discharge: 2015-02-12 | Disposition: A | Discharge status: Home / Self Care | Payer: Medicare Other | Source: Skilled Nursing Facility | Attending: Internal Medicine | Admitting: Internal Medicine

## 2015-02-12 DIAGNOSIS — M6281 Muscle weakness (generalized): Secondary | ICD-10-CM | POA: Insufficient documentation

## 2015-02-12 LAB — URINALYSIS, ROUTINE W REFLEX MICROSCOPIC
Bilirubin Urine: NEGATIVE
Glucose, UA: NEGATIVE mg/dL
Ketones, ur: NEGATIVE mg/dL
Nitrite: POSITIVE — AB
Protein, ur: NEGATIVE mg/dL
Specific Gravity, Urine: 1.01 (ref 1.005–1.030)
Urobilinogen, UA: 0.2 mg/dL (ref 0.0–1.0)
pH: 6 (ref 5.0–8.0)

## 2015-02-12 LAB — URINE MICROSCOPIC-ADD ON

## 2015-02-15 LAB — URINE CULTURE: Colony Count: >=100000

## 2015-02-25 ENCOUNTER — Inpatient Hospital Stay (HOSPITAL_COMMUNITY): Payer: Medicare Other | Attending: Internal Medicine

## 2015-03-05 ENCOUNTER — Inpatient Hospital Stay
Admission: RE | Admit: 2015-03-05 | Discharge: 2015-11-17 | Disposition: A | Discharge status: Still In Hospital | Payer: Medicare Other | Source: Ambulatory Visit | Attending: Internal Medicine | Admitting: Internal Medicine

## 2015-03-05 ENCOUNTER — Emergency Department (HOSPITAL_COMMUNITY)
Admission: EM | Admit: 2015-03-05 | Discharge: 2015-03-05 | Disposition: A | Discharge status: Home / Self Care | Payer: Medicare Other | Attending: Emergency Medicine | Admitting: Emergency Medicine

## 2015-03-05 ENCOUNTER — Encounter (HOSPITAL_COMMUNITY): Payer: Self-pay | Admitting: *Deleted

## 2015-03-05 DIAGNOSIS — Z7982 Long term (current) use of aspirin: Secondary | ICD-10-CM | POA: Diagnosis not present

## 2015-03-05 DIAGNOSIS — Z79899 Other long term (current) drug therapy: Secondary | ICD-10-CM | POA: Diagnosis not present

## 2015-03-05 DIAGNOSIS — E119 Type 2 diabetes mellitus without complications: Secondary | ICD-10-CM | POA: Insufficient documentation

## 2015-03-05 DIAGNOSIS — Z853 Personal history of malignant neoplasm of breast: Secondary | ICD-10-CM | POA: Insufficient documentation

## 2015-03-05 DIAGNOSIS — K219 Gastro-esophageal reflux disease without esophagitis: Secondary | ICD-10-CM | POA: Insufficient documentation

## 2015-03-05 DIAGNOSIS — E780 Pure hypercholesterolemia: Secondary | ICD-10-CM | POA: Diagnosis not present

## 2015-03-05 DIAGNOSIS — Z880 Allergy status to penicillin: Secondary | ICD-10-CM | POA: Insufficient documentation

## 2015-03-05 DIAGNOSIS — F329 Major depressive disorder, single episode, unspecified: Secondary | ICD-10-CM | POA: Insufficient documentation

## 2015-03-05 DIAGNOSIS — N390 Urinary tract infection, site not specified: Secondary | ICD-10-CM | POA: Insufficient documentation

## 2015-03-05 DIAGNOSIS — R509 Fever, unspecified: Secondary | ICD-10-CM

## 2015-03-05 DIAGNOSIS — M1990 Unspecified osteoarthritis, unspecified site: Secondary | ICD-10-CM | POA: Diagnosis not present

## 2015-03-05 DIAGNOSIS — I129 Hypertensive chronic kidney disease with stage 1 through stage 4 chronic kidney disease, or unspecified chronic kidney disease: Secondary | ICD-10-CM | POA: Diagnosis not present

## 2015-03-05 DIAGNOSIS — R059 Cough, unspecified: Principal | ICD-10-CM

## 2015-03-05 DIAGNOSIS — J45909 Unspecified asthma, uncomplicated: Secondary | ICD-10-CM | POA: Diagnosis not present

## 2015-03-05 DIAGNOSIS — Z8701 Personal history of pneumonia (recurrent): Secondary | ICD-10-CM | POA: Diagnosis not present

## 2015-03-05 DIAGNOSIS — R05 Cough: Principal | ICD-10-CM

## 2015-03-05 DIAGNOSIS — N183 Chronic kidney disease, stage 3 (moderate): Secondary | ICD-10-CM | POA: Diagnosis not present

## 2015-03-05 DIAGNOSIS — Z794 Long term (current) use of insulin: Secondary | ICD-10-CM | POA: Diagnosis not present

## 2015-03-05 DIAGNOSIS — F911 Conduct disorder, childhood-onset type: Secondary | ICD-10-CM | POA: Diagnosis present

## 2015-03-05 DIAGNOSIS — I5032 Chronic diastolic (congestive) heart failure: Secondary | ICD-10-CM | POA: Insufficient documentation

## 2015-03-05 LAB — CBC WITH DIFFERENTIAL/PLATELET
Basophils Absolute: 0 10*3/uL (ref 0.0–0.1)
Basophils Relative: 0 % (ref 0–1)
EOS ABS: 0.1 10*3/uL (ref 0.0–0.7)
Eosinophils Relative: 1 % (ref 0–5)
HCT: 39.4 % (ref 36.0–46.0)
Hemoglobin: 12.3 g/dL (ref 12.0–15.0)
LYMPHS PCT: 25 % (ref 12–46)
Lymphs Abs: 3.1 10*3/uL (ref 0.7–4.0)
MCH: 27.5 pg (ref 26.0–34.0)
MCHC: 31.2 g/dL (ref 30.0–36.0)
MCV: 88.1 fL (ref 78.0–100.0)
MONO ABS: 1 10*3/uL (ref 0.1–1.0)
MONOS PCT: 8 % (ref 3–12)
Neutro Abs: 7.8 10*3/uL — ABNORMAL HIGH (ref 1.7–7.7)
Neutrophils Relative %: 66 % (ref 43–77)
Platelets: 246 10*3/uL (ref 150–400)
RBC: 4.47 MIL/uL (ref 3.87–5.11)
RDW: 16 % — ABNORMAL HIGH (ref 11.5–15.5)
WBC: 12.1 10*3/uL — AB (ref 4.0–10.5)

## 2015-03-05 LAB — LACTIC ACID, PLASMA: LACTIC ACID, VENOUS: 1.1 mmol/L (ref 0.5–2.0)

## 2015-03-05 LAB — URINE MICROSCOPIC-ADD ON

## 2015-03-05 LAB — URINALYSIS, ROUTINE W REFLEX MICROSCOPIC
BILIRUBIN URINE: NEGATIVE
Glucose, UA: NEGATIVE mg/dL
Ketones, ur: NEGATIVE mg/dL
NITRITE: POSITIVE — AB
PROTEIN: NEGATIVE mg/dL
Specific Gravity, Urine: <1.005 — ABNORMAL LOW (ref 1.005–1.030)
UROBILINOGEN UA: 0.2 mg/dL (ref 0.0–1.0)
pH: 6 (ref 5.0–8.0)

## 2015-03-05 LAB — BASIC METABOLIC PANEL
Anion gap: 11 (ref 5–15)
BUN: 48 mg/dL — AB (ref 6–20)
CO2: 29 mmol/L (ref 22–32)
Calcium: 8.5 mg/dL — ABNORMAL LOW (ref 8.9–10.3)
Chloride: 98 mmol/L — ABNORMAL LOW (ref 101–111)
Creatinine, Ser: 1.3 mg/dL — ABNORMAL HIGH (ref 0.44–1.00)
GFR calc Af Amer: 41 mL/min — ABNORMAL LOW (ref >60)
GFR calc non Af Amer: 35 mL/min — ABNORMAL LOW (ref >60)
GLUCOSE: 134 mg/dL — AB (ref 65–99)
Potassium: 4 mmol/L (ref 3.5–5.1)
SODIUM: 138 mmol/L (ref 135–145)

## 2015-03-05 MED ORDER — NITROFURANTOIN MONOHYD MACRO 100 MG PO CAPS
100.0000 mg | ORAL_CAPSULE | Freq: Two times a day (BID) | ORAL | Status: DC
  Administered 2015-03-05: 100 mg via ORAL
  Filled 2015-03-05: qty 1

## 2015-03-05 MED ORDER — NITROFURANTOIN MONOHYD MACRO 100 MG PO CAPS: 100.0000 mg | ORAL_CAPSULE | Freq: Two times a day (BID) | ORAL | Status: DC

## 2015-03-05 NOTE — Discharge Instructions (Signed)

## 2015-03-05 NOTE — ED Notes (Signed)
Pt sent to er from [facility] for aggressive behavior, ringing in her ears, pt given 0.5mg  ativan im, prior to coming to er, per nursing notes sent with pt to er pt recently treated for uti, on arrival to er pt alert, will become agitated at times, able to be redirected,

## 2015-03-05 NOTE — ED Provider Notes (Signed)
CSN: [id number]     Arrival date & time 03/05/15  0120 History   First MD Initiated Contact with Patient 03/05/15 0134     Chief Complaint  Patient presents with  . Aggressive Behavior     (Consider location/radiation/quality/duration/timing/severity/associated sxs/prior Treatment) HPI  This is a 81 year old female with a history of recurrent UTIs who presents from the [facility] with aggressive behavior.  Patient is oriented 2 which is at her baseline. She reports that "they are out to get me and "they made me mad."  The daughter reports that over the last 2 days, her mother has had several outbursts including throwing dishes and grabbing the nurses. She was called earlier this evening for escalating behaviors. Police officers intervened. Patient received 0.5 mg of Ativan. She is currently pleasant and cooperative. Daughter states that she's had frequent urinary tract infections in the past and sometimes she gets agitated with these. Patient recently finished a course of Bactrim. She is also currently on prednisone for the last week for ongoing bronchitis. No known fevers.  Level V caveat  Patient had urinary tract infection on 6/12 that was Escherichia coli resistant to Cipro. Patient also has multiple allergies to penicillin, Cephlosporins and macrolides.  Past Medical History  Diagnosis Date  . Breast cancer   . Essential hypertension, benign   . Depression   . GERD (gastroesophageal reflux disease)   . Hypercholesteremia   . CKD (chronic kidney disease) stage 3, GFR 30-59 ml/min   . Pneumonia   . UTI (lower urinary tract infection)   . Arthritis   . Asthma   . Venous stasis   . Type 2 diabetes mellitus   . Chronic diastolic heart failure     LVEF 70%  . Mitral stenosis     Moderate  . Secondary pulmonary hypertension     PASP 64 mmHg   Past Surgical History  Procedure Laterality Date  . Replacement total knee    . Mastectomy    . Total hip arthroplasty    . Carotid  endarterectomy    . Cataracts     Family History  Problem Relation Age of Onset  . Asthma Other   . Diabetes Other    History  Substance Use Topics  . Smoking status: Never Smoker   . Smokeless tobacco: Never Used  . Alcohol Use: No   OB History    No data available     Review of Systems  Constitutional: Negative for fever.  Respiratory: Negative for chest tightness and shortness of breath.   Cardiovascular: Negative for chest pain.  Gastrointestinal: Negative for nausea, vomiting and abdominal pain.  Genitourinary: Negative for dysuria.  Neurological: Negative for headaches.  Psychiatric/Behavioral: Positive for behavioral problems, confusion and agitation.  All other systems reviewed and are negative.     Allergies  Aleve; Carbapenems; Cephalosporins; Codeine; Amoxicillin; and Penicillins  Home Medications   Prior to Admission medications   Medication Sig Start Date End Date Taking? Authorizing Provider  acetaminophen (TYLENOL) 325 MG tablet Take 650 mg by mouth every 6 (six) hours as needed for mild pain.     Historical Provider, MD  albuterol (PROVENTIL HFA;VENTOLIN HFA) 108 (90 BASE) MCG/ACT inhaler Inhale 2 puffs into the lungs every 6 (six) hours as needed for wheezing or shortness of breath.    Historical Provider, MD  allopurinol (ZYLOPRIM) 100 MG tablet TAKE (2) TABLETS BY MOUTH ONCE DAILY. 08/07/13   Mikey Kirschner, MD  alum & mag hydroxide-simeth [facility])  200-200-20 MG/5ML suspension Take by mouth every 4 (four) hours as needed for indigestion or heartburn.    Historical Provider, MD  amLODipine (NORVASC) 10 MG tablet Take 10 mg by mouth daily.    Historical Provider, MD  aspirin 325 MG tablet Take 1 tablet (325 mg total) by mouth daily. 11/15/14   Kathie Dike, MD  benzonatate (TESSALON) 100 MG capsule Take 1 capsule (100 mg total) by mouth 3 (three) times daily as needed for cough. 04/19/14   Kathie Dike, MD  Cranberry 475 MG CAPS Take 1 capsule by  mouth every 12 (twelve) hours.    Historical Provider, MD  Cranberry-Vitamin C-Inulin (UTI-STAT) LIQD Take 30 mLs by mouth daily.    Historical Provider, MD  fenofibrate 54 MG tablet Take 1 tablet (54 mg total) by mouth daily. Patient not taking: Reported on 11/14/2014 12/14/13   Radene Gunning, NP  guaiFENesin (MUCINEX) 600 MG 12 hr tablet Take 1 tablet (600 mg total) by mouth 2 (two) times daily. 04/19/14   Kathie Dike, MD  guaifenesin (TUSSIN) 100 MG/5ML syrup Take 200 mg by mouth 2 (two) times daily as needed for cough.     Historical Provider, MD  hydrOXYzine (ATARAX/VISTARIL) 25 MG tablet Take 12.5 mg by mouth every 6 (six) hours as needed for itching.    Historical Provider, MD  insulin glargine (LANTUS) 100 UNIT/ML injection Inject 5 Units into the skin at bedtime.    Historical Provider, MD  insulin lispro (HUMALOG) 100 UNIT/ML injection Inject 2-6 Units into the skin 3 (three) times daily before meals. Sliding scale as follows: 200-250=2units 251-300=4units 301-350=6units If CBG greater than 350 give 6 units and call Provider    Historical Provider, MD  ipratropium-albuterol (DUONEB) 0.5-2.5 (3) MG/3ML SOLN Take 3 mLs by nebulization every 4 (four) hours as needed (shortness of breath).    Historical Provider, MD  magnesium hydroxide (MILK OF MAGNESIA) 400 MG/5ML suspension Take 30 mLs by mouth daily as needed for mild constipation.    Historical Provider, MD  metoprolol (LOPRESSOR) 50 MG tablet TAKE 1 TABLET BY MOUTH TWICE DAILY. 11/08/13   Mikey Kirschner, MD  nitrofurantoin, macrocrystal-monohydrate, (MACROBID) 100 MG capsule Take 1 capsule (100 mg total) by mouth every 12 (twelve) hours. 03/05/15   Merryl Hacker, MD  oxybutynin (DITROPAN-XL) 5 MG 24 hr tablet Take 1 tablet (5 mg total) by mouth at bedtime. Patient not taking: Reported on 11/14/2014 09/06/13   Mikey Kirschner, MD  PARoxetine (PAXIL) 30 MG tablet Take 30 mg by mouth daily.    Historical Provider, MD  Polyethyl  Glycol-Propyl Glycol (SYSTANE OP) Place 1 drop into both eyes 2 (two) times daily.    Historical Provider, MD  potassium chloride SA (K-DUR,KLOR-CON) 20 MEQ tablet Take 40 mEq by mouth 2 (two) times daily.     Historical Provider, MD  ranitidine (ZANTAC) 150 MG tablet Take 150 mg by mouth at bedtime.    Historical Provider, MD  simvastatin (ZOCOR) 10 MG tablet Take 10 mg by mouth at bedtime.    Historical Provider, MD  Spacer/Aero-Holding Chambers (AEROCHAMBER MV) inhaler Use as instructed. 2 puffs BID Patient not taking: Reported on 11/14/2014 09/28/13   Mikey Kirschner, MD  torsemide (DEMADEX) 20 MG tablet Take 60 mg by mouth 2 (two) times daily.     Historical Provider, MD   BP 162/69 mmHg  Pulse 84  Temp(Src) 98.5 F (36.9 C) (Rectal)  Resp 16  SpO2 96% Physical Exam  Constitutional: No  distress.  Elderly, overweight  HENT:  Head: Normocephalic and atraumatic.  Cardiovascular: Normal rate, regular rhythm and normal heart sounds.   No murmur heard. Pulmonary/Chest: Effort normal. No respiratory distress. She has wheezes. She has no rales.  Abdominal: Soft. Bowel sounds are normal. There is no tenderness. There is no rebound.  Musculoskeletal: She exhibits edema.  1+ bilateral lower extremity edema  Neurological: She is alert.  Oriented to person and place  Skin: Skin is warm and dry.  Psychiatric:  Cooperative  Nursing note and vitals reviewed.   ED Course  Procedures (including critical care time) Labs Review Labs Reviewed  CBC WITH DIFFERENTIAL/PLATELET - Abnormal; Notable for the following:    WBC 12.1 (*)    RDW 16.0 (*)    Neutro Abs 7.8 (*)    All other components within normal limits  BASIC METABOLIC PANEL - Abnormal; Notable for the following:    Chloride 98 (*)    Glucose, Bld 134 (*)    BUN 48 (*)    Creatinine, Ser 1.30 (*)    Calcium 8.5 (*)    GFR calc non Af Amer 35 (*)    GFR calc Af Amer 41 (*)    All other components within normal limits   URINALYSIS, ROUTINE W REFLEX MICROSCOPIC (NOT AT [facility]) - Abnormal; Notable for the following:    APPearance HAZY (*)    Specific Gravity, Urine <1.005 (*)    Hgb urine dipstick SMALL (*)    Nitrite POSITIVE (*)    Leukocytes, UA SMALL (*)    All other components within normal limits  URINE MICROSCOPIC-ADD ON - Abnormal; Notable for the following:    Squamous Epithelial / LPF FEW (*)    Bacteria, UA MANY (*)    All other components within normal limits  URINE CULTURE  LACTIC ACID, PLASMA    Imaging Review No results found.   EKG Interpretation None      MDM   Final diagnoses:  UTI (lower urinary tract infection)    Patient presents with agitation and aggressive behavior. He is calm and cooperative on my exam.  At her baseline mental status. Afebrile and nontoxic. BMP is at his baseline. Mild leukocytosis which could be secondary to prednisone use. Patient has a nitrite positive urine with 7-10 white cells and many bacteria. Suspect new urinary tract infection versus persistent urinary tract infection. Discussed with the daughter options for treatment. Given that she just finished a course of Bactrim, it appears that [place] is her next best option given her most recent urine culture results. Urine culture is pending.  Will discharge back to [facility].  After history, exam, and medical workup I feel the patient has been appropriately medically screened and is safe for discharge home. Pertinent diagnoses were discussed with the patient. Patient was given return precautions.     Merryl Hacker, MD 03/05/15 [phone]

## 2015-03-06 ENCOUNTER — Non-Acute Institutional Stay: Payer: Medicare Other | Admitting: Internal Medicine

## 2015-03-06 DIAGNOSIS — N183 Chronic kidney disease, stage 3 unspecified: Secondary | ICD-10-CM

## 2015-03-06 DIAGNOSIS — F0391 Unspecified dementia with behavioral disturbance: Secondary | ICD-10-CM | POA: Diagnosis not present

## 2015-03-06 DIAGNOSIS — F03918 Unspecified dementia, unspecified severity, with other behavioral disturbance: Secondary | ICD-10-CM

## 2015-03-07 LAB — URINE CULTURE: Culture: >=100000

## 2015-03-08 ENCOUNTER — Telehealth (HOSPITAL_BASED_OUTPATIENT_CLINIC_OR_DEPARTMENT_OTHER): Payer: Self-pay | Admitting: Emergency Medicine

## 2015-03-08 NOTE — Progress Notes (Addendum)
Patient ID: [name], female   DOB: [birth year], 81 y.o.   MRN: [id number]                PROGRESS NOTE  DATE:  03/06/2015          FACILITY: [facility]                   LEVEL OF CARE:   SNF   Acute Visit                        CHIEF COMPLAINT:  Agitated behavior/trip to the ER.    HISTORY OF PRESENT ILLNESS:  On Friday night, [name] apparently became extremely agitated, violent, and according to the nurse, clearly psychotic.  She came up to the nursing station, tried to turn over the chart rack.  She was apparently screaming, "Turn the music off!".  She went to the emergency room.    Per discussion with the nurses, apparently she is agitated several times a week, often verbally assaultive to the staff with severe racial overtones.     The patient states to me that they would not give her anything to eat or drink.  Interestingly, she seems to remember throwing a plate on the floor but does not remember much of the other details, including the trip to the ER.     Of note, she seems to have been treated for a UTI earlier in June at which time she had greater than 100,000 E.coli.  It was quinolone-resistant, but sensitive to Septra.    LABORATORY DATA:  Lab work showed:    CBC:  White count of 12.1 with 66% neutrophils, hemoglobin 12.3, platelet count 246.     Basic metabolic panel:  Sodium 093, potassium 4, CO2 of 29, BUN 48, creatinine 1.3.    Urinalysis:  7-10 white cells, many bacteria, positive nitrite, small hemoglobin.  She was apparently given Macrobid for a UTI.    Urine culture:  Still pending.    PHYSICAL EXAMINATION:   VITAL SIGNS:     PULSE:  79.    RESPIRATIONS:  18 and unlabored.   02 SATURATIONS:  94% on room air.   GENERAL APPEARANCE:  The patient is not in any distress.  Calm, almost depressed-looking.   CHEST/RESPIRATORY:  Shallow, but otherwise clear air entry bilaterally.    CARDIOVASCULAR:   CARDIAC:  Heart sounds are  normal.  2/6 systolic ejection murmur.  She appears to be euvolemic.      GASTROINTESTINAL:   ABDOMEN:  Soft.   LIVER/SPLEEN/KIDNEYS:  No liver, no spleen.  No tenderness.   GENITOURINARY:   BLADDER:  No suprapubic or costovertebral angle tenderness.   CIRCULATION:   EDEMA/VARICOSITIES:  Extremities:  Severe venous stasis, but minimal edema.    ASSESSMENT/PLAN:                        Acute agitation.  She apparently has been followed by Psychiatry for depression.  Her Paxil was increased in February.  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  She hasd dementia.      Chronic renal failure.  This does not appear to be unstable.    COPD.  Again, not unstable.    ?Recurrent UTIs.  At this point, I am not at all sure that this patient has been symptomatic from any of this.  I am especially doubtful that Macrobid  is at this point making all the difference in her behavior.    Pulmonary hypertension.  Likely secondary to COPD, although she also has moderate mitral stenosis.  Her O2 sats are reasonable on room air.    Disinhibited behavior may be secondary to her recent cerebral vascular disease, dementia with depression.  When she gets violent like this, she is going to need something to calm her down.  I am going to leave p.r.n. Haldol.  I am doubtful that her urine had anything to do with this and I think it is likely she has asymptomatic bacteriuria.

## 2015-03-08 NOTE — Telephone Encounter (Signed)
Post ED Visit - Positive Culture Follow-up  Culture report reviewed by antimicrobial stewardship pharmacist: []  Wes Dulaney, Pharm.D., BCPS []  Heide Guile, Pharm.D., BCPS []  Alycia Rossetti, Pharm.D., BCPS []  Nicolaus, Pharm.D., BCPS, AAHIVP []  Legrand Como, Pharm.D., BCPS, AAHIVP []  Isac Sarna, Pharm.D., BCPS Parks Neptune PharmD  Positive urine culture E. coli Treated with nitrofurantoin, organism sensitive to the same and no further patient follow-up is required at this time.  Hazle Nordmann 03/08/2015, 1:38 PM

## 2015-04-26 ENCOUNTER — Encounter: Payer: Self-pay | Admitting: Internal Medicine

## 2015-04-26 ENCOUNTER — Non-Acute Institutional Stay: Payer: Medicare Other | Admitting: Internal Medicine

## 2015-04-26 DIAGNOSIS — I5032 Chronic diastolic (congestive) heart failure: Secondary | ICD-10-CM

## 2015-04-26 DIAGNOSIS — R627 Adult failure to thrive: Secondary | ICD-10-CM | POA: Diagnosis not present

## 2015-04-26 DIAGNOSIS — E785 Hyperlipidemia, unspecified: Secondary | ICD-10-CM

## 2015-04-26 DIAGNOSIS — Z8673 Personal history of transient ischemic attack (TIA), and cerebral infarction without residual deficits: Secondary | ICD-10-CM

## 2015-04-26 DIAGNOSIS — I10 Essential (primary) hypertension: Secondary | ICD-10-CM

## 2015-04-26 DIAGNOSIS — J441 Chronic obstructive pulmonary disease with (acute) exacerbation: Secondary | ICD-10-CM

## 2015-04-26 DIAGNOSIS — E1121 Type 2 diabetes mellitus with diabetic nephropathy: Secondary | ICD-10-CM | POA: Diagnosis not present

## 2015-04-26 NOTE — Progress Notes (Signed)
Patient ID: [name], female   DOB: 15-Sep-1921, 81 y.o.   MRN: [id number]                  DATE: 04/26/2015            FACILITY: [facility]                             LEVEL OF CARE:   SNF   CHIEF COMPLAINT:   Medical management of chronic medical conditions including COPD-diastolic CHFCVA-renal insufficiency-diabetes type 2-GERD-depression-history of agitation                         HISTORY OF PRESENT ILLNESS:  This is a patient who is a longstanding resident in the facility, dating back to April of last year.   She has a complex medical history as noted above-earlier this year she did go to the ER with altered mental status    An MRI of the brain showed an acute infarction in the left inferior body of the caudate.  The family refused any further work-up and even the patient, I think, had said she did not want any work-up.  Her aspirin 81 mg was increased to 325.     It was noted that she was bradycardic with heart rates in the 40s.  Her metoprolol was decreased from 50 twice a day to 25 twice a day.  It is has been downgraded to 12.5 mg twice a day pulse rates appear to be stable.     The patient has been in the building since April 2015.  She was admitted at that point with, I believe, COPD acute.    She also has diastolic heart failure, pulmonary hypertension. She is on Demadex 60 mg twice a day this appears to have stabilized the edema.  She most recently has been seen for some increased agitation Dr. Dellia Nims did start her on when necessary Haldol apparently she does not require this often she also is on low-dose Depakote 125 mg twice a day.  Her daughter has requested a medication review in an attempt to minimize medications-I have discussed this as well with clinical pharmacist consultant who also evaluated her medicines-per consult it does not appear she has significantly sedating medications on board most of these appear to be warranted I do note she  is on a statin which with her advanced age and comorbidities I suspect we can discontinue there is hesitancy to reduce her Depakote secondary to a history of behaviors which apparently have stabilized significantly since she has been on this again her Haldol use is minimal according to nursing staff.  She has a significant COPD history on duo nebs as well as Mucinex.  She also is a type II diabetic on low-dose Lantus.  Patient has had a gradual decline per nursing staff and her daughter her appetite has decreased somewhat she appears to be losing weight-I have discussed this with her daughter responsible party-she is aware this and does not want any aggressive interventions including a feeding tube or possible appetite stimulant-her main goal is that her mother has a quality of life and is comfortable    PAST MEDICAL HISTORY/PROBLEM LIST:                            Osteoarthritis.     Lower extremity weakness, essentially  nonambulatory.    Community-acquired pneumonia.    History of breast cancer, status post left mastectomy.    Hypertension.    Type 2 diabetes.     Chronic COPD.    Carpal tunnel syndrome.    Stress urinary incontinence.    Dysphagia.    Acute-on-chronic diastolic heart failure.    Chronic renal failure with a baseline creatinine of around 1.6.      CURRENT MEDICATIONS:  Discharge medications on return from the hospital:    Enteric-coated aspirin 325 q.d.     Zyloprim 100 q.d. for gout.    Norvasc 10 q.d.      Cranberry q.12.     Guaifenesin 600 b.i.d.           Humalog sliding scale.      Metoprolol 12.5   b.i.d.       Paxil 30 q.d.       Potassium 40 b.i.d.         Zantac 150 q.d.          Demadex 60 b.i.d.        Proventil 2 puffs q.6 hours p.r.n.         Tylenol 650 q.6 p.r.n.         Tessalon three times daily p.r.n.       Hydroxyzine 12.5 q.6 p.r.n. pruritus.     Lantus 5 U at bedtime.                                                                                                                  SOCIAL HISTORY:                   HOUSING:  The patient, I believe was previously at an assisted living.   ADVANCED DIRECTIVES:  She does not have any advanced directives.   FUNCTIONAL STATUS:  She spends most of her time in a wheelchair now.  I.    REVIEW OF SYSTEMS: As is limited since patient is a poor historian and is not talking much.  In general there been no fever or chills noted.  Skin she does have somewhat itching of her back she has received Benadryl however daughter would like this discontinued secondary to concerns about sedation-.  Head ears eyes nose mouth and throat again she has significant visual impairment which is not new does not complain of sore throat                CHEST/RESPIRATORY:  She does not complain of cough or shortness of breath.     CARDIAC:  No chest pain.    GI:  No abdominal pain.   GU history of UTIs but is not complaining of dysuria today NEUROLOGICAL:  No headache or dizziness noted.    MUSCULOSKELETAL:  She is complaining of right shoulder weakness and pain.  --This has been some chronic. Psych she has had some increased behaviors which have moderated some still has occasional periods of agitation but apparently the Depakote  has been beneficial  PHYSICAL EXAMINATION :   VITAL SIGNS:     Temperature 97.6 pulse 66 respirations 18 blood pressure 134/54 all these appear to be stable weight is 191.4 this appears to be trending down low although there are variabilities here appears her weight was around 200 at the beginning of August    GENERAL APPEARANCE: This is an obese elderly female in no distress she has somewhat of a flat affect but is responsive does respond to verbal commands and does speak some Her skin is warm and dry did not note any rashes on her back she has numerous seborrheic keratoses diffuse.  Eyes appear reactive to light again she has diminished visual acuity  which is not new sclera and conjunctiva are clear.  Oropharynx is clear mucous membranes are fairly moist.   CHEST/RESPIRATORY: Shallow but clear air entry bilaterally.     CARDIOVASCULAR:   CARDIAC:   Regular irregular rate and rhythm without murmur gallop or rub she has chronic I would say 2+ lower extremity edema bilaterally     some degree of venous insufficiency.  No clear signs of congestive heart failure.   GASTROINTESTINAL:   ABDOMEN:  No masses.  There is obesity, but no tenderness. I'll sounds are positive                 MUSCULOSKELETAL:    EXTREMITIES:   RIGHT UPPER EXTREMITY:  No focal tenderness in the right shoulder is complaining of pain with abduction and adduction.    BILATERAL LOWER EXTREMITIES:  She has significant osteoarthritis of both knees.   NEUROLOGICAL:          DEEP TENDON REFLEXES:  She is diffusely hyporeflexic, probably secondary to diabetic neuropathy.   SENSATION/STRENGTH:   She appears to have right arm weakness, although this may relate to shoulder pain more than anything else.  I cannot detect any other lateralizing findings.  She has anti-gravity strength in her legs. Neurologic-as stated above her speech is clear but somewhat limited.  Psych she is oriented to self does respond to verbal commands was able to identify her daughter by name did respond largely yes or no  to my questions appropriately --has a somewhat flat affect.  Labs.  03/05/2015.  WBC 12.1 hemoglobin 12.3 platelets 246.  Sodium 138 potassium 4 BUN 48 creatinine 1.3.  12/16/2014.  Liver function tests within normal limits except albumin of 3.4.        ASSESSMENT/PLAN: Failure to thrive? Weight loss-as noted above patient gradually appears to be declining I suspect this is due to comorbidities and I suspect an element of dementia-her daughter as stated above does not want aggressive interventions here basically to be kept comfortable with as high quality of life as  possible-will try to reduce the medications would feel safe reducing including discontinuing the statin simvastatin also will discontinue the when necessary Benadryl and Haldol secondary to concerns of family this may be contributing at times to confusion and somnolence Her daughter is okay with getting some updated labs here again I didn't doubt these will be overtly concerning but would like to see where we stand-.                                 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  This may account for some weakness of the right arm. Otherwise no lateralizing findings she continues on aspirin   sion,  mitral stenosis.  This does not appear to be unstable.    .    Chronic renal insufficiency stage IV.  Discharge lab work from the hospital revealed a potassium of 3.1, BUN of 42, and creatinine of 1.3.  This is stable on lab done last month will update this.    Sinus bradycardia.    Her metoprolol has been reduced recent pulses appear to run from 60- 90.    Hypertension-again this appears to be relatively well controlled with recent systolics in the 975O on Lopressor as well as Norvasc.    History COPD this has been stable recently she is on when necessary nebulizers as well as inhaler she also is on routine Mucinex.    History of diastolic CHF she is on significant dose of Demadex 60 mg twice a day with aggressive potassium supplementation with history of low potassium currently on 40 mEq twice a day Will update a metabolic panel.    History of dementia with agitation she has been started on low-dose Depakote and appears her beneficial effect will update a Depakote level although I think it is quite unlikely this will be elevated-will discontinue the Haldol since that uses minimal and family is concerned about the side effects family is not adverse to having this on occasion as needed but at this point will monitor.    History of gout she does continue on enalapril and all this is  been stable without recent flares.    History diabetes type 2 she is on low-dose Lantus as well as sliding scale.--CBGs run in the lower to mid 100s will update a hemoglobin A1c    History of hyperlipidemia again as noted in attempt to minimize medications Will discontinue the simvastatin.    History of depression she is on Paxil she is followed by psychiatric services.    History of GERD she continues on Zantac at some point possibly consider discontinuing this as well.    History of anemia I suspect there is an element of chronic renal insufficiency year actually recent hemoglobin was 12.3 Will update this as well.    [id number]--of note greater than 45 minutes spent assessing patient-reviewing her chart-discussing her status with her daughter at bedside-and coordinating and formulating a plan of care with family and put-of note greater than 50% of time spent coordinating plan of care.

## 2015-04-28 ENCOUNTER — Encounter (HOSPITAL_COMMUNITY)
Admission: AD | Admit: 2015-04-28 | Discharge: 2015-04-28 | Disposition: A | Discharge status: Home / Self Care | Payer: Medicare Other | Source: Skilled Nursing Facility | Attending: Internal Medicine | Admitting: Internal Medicine

## 2015-04-28 ENCOUNTER — Encounter: Payer: Self-pay | Admitting: Internal Medicine

## 2015-04-28 ENCOUNTER — Non-Acute Institutional Stay: Payer: Medicare Other | Admitting: Internal Medicine

## 2015-04-28 DIAGNOSIS — I10 Essential (primary) hypertension: Secondary | ICD-10-CM | POA: Diagnosis present

## 2015-04-28 DIAGNOSIS — D631 Anemia in chronic kidney disease: Secondary | ICD-10-CM | POA: Diagnosis not present

## 2015-04-28 DIAGNOSIS — Z79899 Other long term (current) drug therapy: Secondary | ICD-10-CM | POA: Insufficient documentation

## 2015-04-28 DIAGNOSIS — I5032 Chronic diastolic (congestive) heart failure: Secondary | ICD-10-CM

## 2015-04-28 DIAGNOSIS — N189 Chronic kidney disease, unspecified: Secondary | ICD-10-CM

## 2015-04-28 DIAGNOSIS — E039 Hypothyroidism, unspecified: Secondary | ICD-10-CM | POA: Diagnosis not present

## 2015-04-28 LAB — COMPREHENSIVE METABOLIC PANEL
ALBUMIN: 3.8 g/dL (ref 3.5–5.0)
ALK PHOS: 98 U/L (ref 38–126)
ALT: 12 U/L — AB (ref 14–54)
AST: 23 U/L (ref 15–41)
Anion gap: 10 (ref 5–15)
BUN: 45 mg/dL — ABNORMAL HIGH (ref 6–20)
CALCIUM: 9.1 mg/dL (ref 8.9–10.3)
CHLORIDE: 100 mmol/L — AB (ref 101–111)
CO2: 28 mmol/L (ref 22–32)
Creatinine, Ser: 1.62 mg/dL — ABNORMAL HIGH (ref 0.44–1.00)
GFR calc Af Amer: 31 mL/min — ABNORMAL LOW (ref >60)
GFR calc non Af Amer: 27 mL/min — ABNORMAL LOW (ref >60)
GLUCOSE: 151 mg/dL — AB (ref 65–99)
Potassium: 4.4 mmol/L (ref 3.5–5.1)
Sodium: 138 mmol/L (ref 135–145)
Total Bilirubin: 0.6 mg/dL (ref 0.3–1.2)
Total Protein: 7.8 g/dL (ref 6.5–8.1)

## 2015-04-28 LAB — CBC WITH DIFFERENTIAL/PLATELET
BASOS PCT: 1 % (ref 0–1)
Basophils Absolute: 0.1 10*3/uL (ref 0.0–0.1)
Eosinophils Absolute: 0.9 10*3/uL — ABNORMAL HIGH (ref 0.0–0.7)
Eosinophils Relative: 9 % — ABNORMAL HIGH (ref 0–5)
HCT: 42.3 % (ref 36.0–46.0)
HEMOGLOBIN: 13.5 g/dL (ref 12.0–15.0)
LYMPHS ABS: 2.4 10*3/uL (ref 0.7–4.0)
Lymphocytes Relative: 22 % (ref 12–46)
MCH: 28.4 pg (ref 26.0–34.0)
MCHC: 31.9 g/dL (ref 30.0–36.0)
MCV: 89.1 fL (ref 78.0–100.0)
MONO ABS: 0.9 10*3/uL (ref 0.1–1.0)
MONOS PCT: 8 % (ref 3–12)
Neutro Abs: 6.6 10*3/uL (ref 1.7–7.7)
Neutrophils Relative %: 61 % (ref 43–77)
Platelets: 165 10*3/uL (ref 150–400)
RBC: 4.75 MIL/uL (ref 3.87–5.11)
RDW: 15.7 % — ABNORMAL HIGH (ref 11.5–15.5)
WBC: 10.9 10*3/uL — ABNORMAL HIGH (ref 4.0–10.5)

## 2015-04-28 LAB — TSH: TSH: 3.775 u[IU]/mL (ref 0.350–4.500)

## 2015-04-28 LAB — VALPROIC ACID LEVEL: Valproic Acid Lvl: 35 ug/mL — ABNORMAL LOW (ref 50.0–100.0)

## 2015-04-28 NOTE — Progress Notes (Signed)
Patient ID: [name], female   DOB: [birth year], 81 y.o.   MRN: [id number]                   DATE: 04/28/2015            FACILITY: [facility]                             LEVEL OF CARE:   SNF   CHIEF COMPLAINT:    acute visit -follow-up renal insufficiency                         HISTORY OF PRESENT ILLNESS:  .     The patient has been in the building since April 2015.  She was admitted at that point with, I believe, COPD acute.    She also has diastolic heart failure, pulmonary hypertension. and dementia which appears to be progressing.  I did see patient and had an extensive discussion with her daughter earlier this week her daughter does not want any aggressive measures basically wants supportive care for her mother and medications minimize which we have tried to do.  Her daughter was okay with ordering lab work for updated values since she does have a history of renal insufficiency creatinine on lab done today is 1.62 with a BUN of 45 per chart review it appears her baseline creatinine is 1.3-1.6 recently.  She is on Demadex as well as potassium  Today she actually is more alert talking more in fact when I entered the room she called me by name which I found quite surprising.  Apparently her appetite eating and drinking is somewhat sporadic     PAST MEDICAL HISTORY/PROBLEM LIST:                            Osteoarthritis.     Lower extremity weakness, essentially nonambulatory.    Community-acquired pneumonia.    History of breast cancer, status post left mastectomy.    Hypertension.    Type 2 diabetes.     Chronic COPD.    Carpal tunnel syndrome.    Stress urinary incontinence.    Dysphagia.    Acute-on-chronic diastolic heart failure.    Chronic renal failure with a baseline creatinine of around 1.6.      CURRENT MEDICATIONS:  :    Enteric-coated aspirin 325 q.d.     Zyloprim 100 q.d. for gout.    Norvasc 10 q.d.       Cranberry q.12.     Guaifenesin 600 b.i.d.           Humalog sliding scale.      Metoprolol 12.5   b.i.d.       Paxil 30 q.d.       Potassium 40 b.i.d.         Zantac 150 q.d.          Demadex 60 b.i.d.        Proventil 2 puffs q.6 hours p.r.n.         Tylenol 650 q.6 p.r.n.         Tessalon three times daily p.r.n.       Hydroxyzine 12.5 q.6 p.r.n. pruritus.     Lantus 5 U at bedtime.  SOCIAL HISTORY:                   HOUSING:  The patient, I believe was previously at an assisted living.   ADVANCED DIRECTIVES:  She does not have any advanced directives.   FUNCTIONAL STATUS:  She spends most of her time in a wheelchair now.  I.    REVIEW OF SYSTEMS: is limited since patient is a poor historian a.  In general there   hasbeen no fever or chills noted.   Marland Kitchen  Head ears eyes nose mouth and throat again she has significant visual impairment which is not new does not complain of sore throat                CHEST/RESPIRATORY:  She does not complain of cough or shortness of breath.     CARDIAC:  No chest pain.    GI:  No abdominal pain.   GU history of UTIs but is not complaining of dysuria today NEUROLOGICAL:  No headache or dizziness noted appears more alert today.    MUSCULOSKELETAL:  She is complaining of right shoulder weakness and pain.  --This has been some chronic. Psych she has had some increased behaviors which have moderated some still has occasional periods of agitation but apparently the Depakote has been beneficial  PHYSICAL EXAMINATION :   VITAL SIGNS:      Is afebrile pulse of 80 blood pressure 134/54 weight is 191.4     GENERAL APPEARANCE: This is an obese elderly female in no distress  Lying in bed comfortably actually called me by name when I entered the room was more talkative alert today Her skin is warm and dry -- she has numerous  seborrheic keratoses diffuse.  Eyes appear reactive to light again she has diminished visual acuity which is not new sclera and conjunctiva are clear.  Oropharynx is clear mucous membranes are fairly moist.   CHEST/RESPIRATORY: Shallow but clear air entry bilaterally.     CARDIOVASCULAR:   CARDIAC:   Regular irregular rate and rhythm without murmur gallop or rub she has chronic I would say 2+ lower extremity edema bilaterally     some degree of venous insufficiency.  No clear signs of congestive heart failure.   GASTROINTESTINAL:   ABDOMEN:  No masses.  There is obesity, but no tenderness. I'll sounds are positive                 MUSCULOSKELETAL:    EXTREMITIES:    Moves all extremities 4 with lower extremity weakness with history of right shoulder limitation    BILATERAL LOWER EXTREMITIES:  She has significant osteoarthritis of both knees.   NEUROLOGICAL:           Has some right arm weakness I suspect is secondary to her shoulder issues otherwise no lateralizing findings has continued lower extremity weakness  Neurologic-as stated above her speech is clear but somewhat limited.  Psych she is oriented to self does respond to verbal commands was able to identify her daughter by name did respond largely yes or no  to my questions appropriately --has a somewhat flat affect.  Labs.  04/28/2015.  Sodium 138 potassium 4.4 BUN 45 creatinine 1.62  WBC 10.9 hemoglobin 13.5 platelets 165-neutrophils within normal limits.  Of note albumin was 3.8 liver function tests within normal limits  03/05/2015.  WBC 12.1 hemoglobin 12.3 platelets 246.  Sodium 138 potassium 4 BUN 48 creatinine 1.3.  12/16/2014.  Liver function tests  within normal limits except albumin of 3.4.        ASSESSMENT/PLAN:    .    Marland Kitchen    Chronic renal insufficiency stage-her creatinine appears to be on the upper end of baseline at 1.6 patient appears actually more alert today again emphasis here is on  more comfort and supportive measures no aggressive intervention at this point will monitor and recheck this next week at this point continue the Demadex with her history of CHF           History of diastolic CHF she is on significant dose of Demadex 60 mg twice a day with aggressive potassium supplementation with history of low potassium currently on 40 mEq twice a day Will update a metabolic panel week as noted above .    History of dementia with agitation she has been started on low-dose Depakote and appears her beneficial effect --Depakote level was 35 on lab done today  I suspect this is a big contribuor to her failure to thrive although she is more alert and talkative today I suspect she will continue to have some good days and some not challenging days and her daughter is aware of this and comfortable-she is really desiring more supportive measu         History of anemia I suspect there is an element of chronic renal insufficiency reaction-hemoglobin stable at 13.5 on lab done today           [id number]

## 2015-04-29 LAB — HEPATITIS B SURFACE ANTIGEN: Hepatitis B Surface Ag: NEGATIVE

## 2015-05-01 ENCOUNTER — Encounter (HOSPITAL_COMMUNITY)
Admission: RE | Admit: 2015-05-01 | Discharge: 2015-05-01 | Disposition: A | Discharge status: Home / Self Care | Payer: Medicare Other | Source: Skilled Nursing Facility | Attending: Internal Medicine | Admitting: Internal Medicine

## 2015-05-01 DIAGNOSIS — I10 Essential (primary) hypertension: Secondary | ICD-10-CM | POA: Diagnosis not present

## 2015-05-01 LAB — BASIC METABOLIC PANEL
ANION GAP: 11 (ref 5–15)
BUN: 35 mg/dL — ABNORMAL HIGH (ref 6–20)
CALCIUM: 9.3 mg/dL (ref 8.9–10.3)
CHLORIDE: 101 mmol/L (ref 101–111)
CO2: 29 mmol/L (ref 22–32)
Creatinine, Ser: 1.37 mg/dL — ABNORMAL HIGH (ref 0.44–1.00)
GFR calc Af Amer: 38 mL/min — ABNORMAL LOW (ref >60)
GFR calc non Af Amer: 33 mL/min — ABNORMAL LOW (ref >60)
GLUCOSE: 136 mg/dL — AB (ref 65–99)
Potassium: 4.2 mmol/L (ref 3.5–5.1)
Sodium: 141 mmol/L (ref 135–145)

## 2015-05-12 ENCOUNTER — Non-Acute Institutional Stay: Payer: Medicare Other | Admitting: Internal Medicine

## 2015-05-12 DIAGNOSIS — I5033 Acute on chronic diastolic (congestive) heart failure: Secondary | ICD-10-CM | POA: Diagnosis not present

## 2015-05-12 DIAGNOSIS — N184 Chronic kidney disease, stage 4 (severe): Secondary | ICD-10-CM

## 2015-05-12 NOTE — Progress Notes (Signed)
Patient ID: [name], female   DOB: [birth year], 81 y.o.   MRN: [id number]                   This is an acute visit            FACILITY: [facility]:   SNF   CHIEF COMPLAINT:    acute visit -secondary to weight gain-follow-up renal insufficiency                         HISTORY OF PRESENT ILLNESS:  .     The patient has been in the building since April 2015.  She was admitted at that point with, I believe, COPD acute.    She also has diastolic heart failure, pulmonary hypertension. and dementia which appears to be progressing.  I did see patient and had an extensive discussion with her daughter last month her daughter does not want any aggressive measures basically wants supportive care for her mother and medications minimize which we have tried to do.  Patient actually has been doing well she appears to be more alert and talkative-nursing staff has noted a weight gain currently 199 it appears previous weights have been more in the mid 190 range-she has chronic venous stasis edema that does not appear to be grossly changed.  She does have a history of diastolic CHF is on Demadex 60 mg twice a day  She is a poor historian secondary to dementia but is not complaining of any shortness of breath.  We've also been following her renal function she does have some baseline renal insufficiency creatinine most recently of 1.37 which actually appears to be improved from previous creatinine of 1.6.        PAST MEDICAL HISTORY/PROBLEM LIST:                            Osteoarthritis.     Lower extremity weakness, essentially nonambulatory.    Community-acquired pneumonia.    History of breast cancer, status post left mastectomy.    Hypertension.    Type 2 diabetes.     Chronic COPD.    Carpal tunnel syndrome.    Stress urinary incontinence.    Dysphagia.    Acute-on-chronic diastolic heart failure.     Chronic renal failure with a baseline creatinine of around 1.6.      CURRENT MEDICATIONS:  :    Enteric-coated aspirin 325 q.d.     Zyloprim 100 q.d. for gout.    Norvasc 10 q.d.      Cranberry q.12.     Guaifenesin 600 b.i.d.           Humalog sliding scale.      Metoprolol 12.5   b.i.d.       Paxil 30 q.d.       Potassium 40 b.i.d.         Zantac 150 q.d.          Demadex 60 b.i.d.        Proventil 2 puffs q.6 hours p.r.n.         Tylenol 650 q.6 p.r.n.         Tessalon three times daily p.r.n.  Hydroxyzine 12.5 q.6 p.r.n. pruritus.     Lantus 5 U at bedtime.                                                                                                                 SOCIAL HISTORY:                   HOUSING:  The patient, I believe was previously at an assisted living.   ADVANCED DIRECTIVES:  She does not have any advanced directives.   FUNCTIONAL STATUS:  She spends most of her time in a wheelchair now.  I.    REVIEW OF SYSTEMS: is limited since patient is a poor historian a.  In general there   hasbeen no fever or chills noted. Has had some weight gain it appears   .  Head ears eyes nose mouth and throat again she has significant visual impairment which is not new does not complain of sore throat                CHEST/RESPIRATORY:  She does not complain of cough or shortness of breath.     CARDIAC:  No chest pain.    GI:  No abdominal pain.   GU history of UTIs but is not complaining of dysuria today NEUROLOGICAL:  No headache or dizziness noted appears more alert today.    MUSCULOSKELETAL: Continues to complain at times of some right shoulder pain which is chronic A medications appear to be fairly effective. Psych she has had some increased behaviors which have moderated some still has occasional periods of agitation but apparently the Depakote has been beneficial  PHYSICAL EXAMINATION :   VITAL SIGNS:      She is afebrile pulse is 64  respirations 18 blood pressure 144/60.  Weight is 199.2 per review of weights that appears to average more in the lower mid 190s    GENERAL APPEARANCE: This is an obese elderly female in no distress   Sitting up in wheelchair today she is somewhat talkative which is encouraging Her skin is warm and dry -- she has numerous seborrheic keratoses diffuse.  Eyes appear reactive to light again she has diminished visual acuity which is not new sclera and conjunctiva are clear.  Oropharynx is clear mucous membranes are fairly moist.   CHEST/RESPIRATORY: Shallow but clear air entry bilaterally.     CARDIOVASCULAR:   CARDIAC:   Regular irregular rate and rhythm without murmur gallop or rub she has chronic I would say 2+ lower extremity edema bilaterally this is possibly slightly increased but not dramatically from previous exams     some degree of venous insufficiency.  No clear signs of congestive heart failure.   GASTROINTESTINAL:   ABDOMEN:  No masses.  There is obesity, but no tenderness. Ibowel sounds are positive                 MUSCULOSKELETAL:    EXTREMITIES:    Moves all extremities 4 with lower extremity weakness with history  of right shoulder limitation    BILATERAL LOWER EXTREMITIES:  She has significant osteoarthritis of both knees.   NEUROLOGICAL:           Has some right arm weakness I suspect is secondary to her shoulder issues otherwise no lateralizing findings has continued lower extremity weakness  Neurologic-as stated above her speech is clear but somewhat limited.  Psych she is oriented to self is talking somewhat confused but follows verbal commands easily today .  Labs.  04/28/2015.  Sodium 141 potassium 4.2 BUN 35 creatinine 1.37.  WBC 10.9 hemoglobin 13.5 platelets 165  04/28/2015.  Sodium 138 potassium 4.4 BUN 45 creatinine 1.62  WBC 10.9 hemoglobin 13.5 platelets 165-neutrophils within normal limits.  Of note albumin was 3.8 liver function tests  within normal limits  03/05/2015.  WBC 12.1 hemoglobin 12.3 platelets 246.  Sodium 138 potassium 4 BUN 48 creatinine 1.3.  12/16/2014.  Liver function tests within normal limits except albumin of 3.4.        ASSESSMENT/PLAN:    .    Marland Kitchen    Chronic renal insufficiency stage-  This appears relatively baseline with creatinine 1.37-it had been up over 1.6 recently this appears to vary at atimes         History of diastolic CHF she is on significant dose of Demadex 60 mg twice a day with aggressive potassium supplementation with history of low potassium currently on 40 mEq twice a day  With what appears to be some weight gain which appears to be legitimate will increase her Demadex slightly to 80 mg in the a.m. Continue60 mg later in the day and update a metabolic panel--on Monday, September 12   History of dementia with behaviors this has been stable she is on Depakote which appears to have helped-she actually is more bright and alert the last couple times I seen her which is encouraging again family does not want overly aggressive measures-she actually appears to be stable in this regards which is encouraging.  [id number]          History of anemia I suspect there is an element of chronic renal insufficiency reaction-hemoglobin stable at 13.5 on lab done today           [id number]

## 2015-05-13 ENCOUNTER — Encounter: Payer: Self-pay | Admitting: Internal Medicine

## 2015-05-15 ENCOUNTER — Encounter (HOSPITAL_COMMUNITY)
Admission: AD | Admit: 2015-05-15 | Discharge: 2015-05-15 | Disposition: A | Discharge status: Home / Self Care | Payer: Medicare Other | Source: Skilled Nursing Facility | Attending: Internal Medicine | Admitting: Internal Medicine

## 2015-05-15 DIAGNOSIS — N184 Chronic kidney disease, stage 4 (severe): Secondary | ICD-10-CM | POA: Insufficient documentation

## 2015-05-15 LAB — BASIC METABOLIC PANEL
ANION GAP: 7 (ref 5–15)
BUN: 37 mg/dL — ABNORMAL HIGH (ref 6–20)
CHLORIDE: 104 mmol/L (ref 101–111)
CO2: 30 mmol/L (ref 22–32)
CREATININE: 1.17 mg/dL — AB (ref 0.44–1.00)
Calcium: 8.6 mg/dL — ABNORMAL LOW (ref 8.9–10.3)
GFR calc non Af Amer: 40 mL/min — ABNORMAL LOW (ref >60)
GFR, EST AFRICAN AMERICAN: 46 mL/min — AB (ref >60)
Glucose, Bld: 120 mg/dL — ABNORMAL HIGH (ref 65–99)
Potassium: 3.6 mmol/L (ref 3.5–5.1)
SODIUM: 141 mmol/L (ref 135–145)

## 2015-05-19 ENCOUNTER — Non-Acute Institutional Stay: Payer: Medicare Other | Admitting: Internal Medicine

## 2015-05-19 ENCOUNTER — Encounter (HOSPITAL_COMMUNITY)
Admission: RE | Admit: 2015-05-19 | Discharge: 2015-05-19 | Disposition: A | Discharge status: Home / Self Care | Payer: Medicare Other | Source: Skilled Nursing Facility | Attending: Internal Medicine | Admitting: Internal Medicine

## 2015-05-19 DIAGNOSIS — N184 Chronic kidney disease, stage 4 (severe): Secondary | ICD-10-CM | POA: Diagnosis not present

## 2015-05-19 DIAGNOSIS — L298 Other pruritus: Secondary | ICD-10-CM

## 2015-05-19 DIAGNOSIS — N898 Other specified noninflammatory disorders of vagina: Secondary | ICD-10-CM

## 2015-05-19 DIAGNOSIS — I5033 Acute on chronic diastolic (congestive) heart failure: Secondary | ICD-10-CM | POA: Diagnosis not present

## 2015-05-19 LAB — BASIC METABOLIC PANEL
Anion gap: 8 (ref 5–15)
BUN: 37 mg/dL — AB (ref 6–20)
CHLORIDE: 104 mmol/L (ref 101–111)
CO2: 30 mmol/L (ref 22–32)
Calcium: 9.2 mg/dL (ref 8.9–10.3)
Creatinine, Ser: 1.29 mg/dL — ABNORMAL HIGH (ref 0.44–1.00)
GFR calc Af Amer: 41 mL/min — ABNORMAL LOW (ref >60)
GFR calc non Af Amer: 35 mL/min — ABNORMAL LOW (ref >60)
Glucose, Bld: 130 mg/dL — ABNORMAL HIGH (ref 65–99)
Potassium: 3.8 mmol/L (ref 3.5–5.1)
Sodium: 142 mmol/L (ref 135–145)

## 2015-05-19 NOTE — Progress Notes (Signed)
Patient ID: [name], female   DOB: 1922-07-24, 81 y.o.   MRN: [id number]                    This is an acute visit            FACILITY: [facility]:   SNF   CHIEF COMPLAINT:     Acute visit follow-up vaginal itching follow-up renal insufficiency and weight gain                         HISTORY OF PRESENT ILLNESS:  .     The patient has been in the building since April 2015.  She was admitted at that point with, I believe,  COPD complication    She also has diastolic heart failure, pulmonary hypertension. and dementia which appears to be progressing.  I did see patient and had an extensive discussion with her daughter last month her daughter does not want any aggressive measures basically wants supportive care for her mother and medications minimize which we have tried to do.  Patient actually has been doing well she appears to be more alert and talkative-nursing staff has noted a weight gain at 199 it     She does have a history of diastolic CHF was on Demadex 60 mg twice a day-- increased this to 80 mg in the morning 60 mg p.m. And her weight appears to have moderated currently 195  She is a poor historian secondary to dementia but is not complaining of any shortness of breath.  We've also been following her renal function she does have some baseline renal insufficiency creatinine most recently of 1.29 which actually appears to be improved from previous creatinine of 1.6 and 1.37.   patient's main complaint today is vaginal itching also apparently some hemorrhoid discomfort        PAST MEDICAL HISTORY/PROBLEM LIST:                            Osteoarthritis.     Lower extremity weakness, essentially nonambulatory.    Community-acquired pneumonia.    History of breast cancer, status post left mastectomy.    Hypertension.    Type 2 diabetes.     Chronic COPD.    Carpal tunnel syndrome.     Stress urinary incontinence.    Dysphagia.    Acute-on-chronic diastolic heart failure.    Chronic renal failure with a baseline creatinine of around 1.6.      CURRENT MEDICATIONS:  :    Enteric-coated aspirin 325 q.d.     Zyloprim 100 q.d. for gout.    Norvasc 10 q.d.      Cranberry q.12.     Guaifenesin 600 b.i.d.           Humalog sliding scale.      Metoprolol 12.5   b.i.d.       Paxil 30 q.d.       Potassium 40 b.i.d.         Zantac 150 q.d.          Demadex 60 b.i.d.        Proventil 2 puffs q.6 hours p.r.n.         Tylenol 650  q.6 p.r.n.         Tessalon three times daily p.r.n.       Hydroxyzine 12.5 q.6 p.r.n. pruritus.     Lantus 5 U at bedtime.                                                                                                                 SOCIAL HISTORY:                   HOUSING:  The patient, I believe was previously at an assisted living.   ADVANCED DIRECTIVES:  She does not have any advanced directives.   FUNCTIONAL STATUS:  She spends most of her time in a wheelchair now.  I.    REVIEW OF SYSTEMS: is limited since patient is a poor historian .  In general there   hasbeen no fever or chills noted. Her weight appears to have stabilized   .  Head ears eyes nose mouth and throat again she has significant visual impairment which is not new does not complain of sore throat                CHEST/RESPIRATORY:  She does not complain of cough or shortness of breath.     CARDIAC:  No chest pain. Chronic  Edema and venous stasis changes    GI:  No abdominal pain.   GU history of UTIs  Currently complaining of vaginal itching NEUROLOGICAL:  No headache or dizziness noted appears more alert today.    MUSCULOSKELETAL: Continues to complain at times of some right shoulder pain which is chronic  medications appear to be fairly effective. Psych she has had some increased behaviors which have moderated some still has occasional periods  of agitation but apparently the Depakote has been beneficial  PHYSICAL EXAMINATION :   VITAL SIGNS:      She is afebrile pulse is 62 respirations 18 blood pressure his pending  Weight is 195 per review of weights  Appears down about 4 pounds since Demadex was increased    GENERAL APPEARANCE: This is an obese elderly female in no distress    Her skin is warm and dry -- she has numerous seborrheic keratoses diffuse.  Eyes appear reactive to light again she has diminished visual acuity which is not new sclera and conjunctiva are clear.  Oropharynx is clear mucous membranes are fairly moist.   CHEST/RESPIRATORY: Shallow but clear air entry bilaterally.     CARDIOVASCULAR:   CARDIAC:   Regular irregular rate and rhythm without murmur gallop or rub she has chronic I would say 2+ lower extremity edema bilaterally this appears relatively baseline--- not increased from previous exam     some degree of venous insufficiency.  No clear signs of congestive heart failure.   GASTROINTESTINAL:   ABDOMEN:  No masses.  There is obesity, but no tenderness. Ibowel sounds are positive   Rectal-she does appear to have an external hemorrhoid   GU- cannot really appreciate any vaginal discharge or  rash appears to possibly have some vaginal atrophy                 MUSCULOSKELETAL:    EXTREMITIES:    Moves all extremities 4 with lower extremity weakness with history of right shoulder limitation    BILATERAL LOWER EXTREMITIES:  She has significant osteoarthritis of both knees.   NEUROLOGICAL:           Has some right arm weakness I suspect is secondary to her shoulder issues otherwise no lateralizing findings has continued lower extremity weakness  Neurologic-as stated above her speech is clear but somewhat limited.  Psych she is oriented to self is talking somewhat confused but follows verbal commands easily today .  Labs.   05/19/2015.  Sodium 142 potassium 3.8 BUN 37 creatinine 1.29-- CO2  is 30   04/28/2015.  Sodium 141 potassium 4.2 BUN 35 creatinine 1.37.  WBC 10.9 hemoglobin 13.5 platelets 165  04/28/2015.  Sodium 138 potassium 4.4 BUN 45 creatinine 1.62  WBC 10.9 hemoglobin 13.5 platelets 165-neutrophils within normal limits.  Of note albumin was 3.8 liver function tests within normal limits  03/05/2015.  WBC 12.1 hemoglobin 12.3 platelets 246.  Sodium 138 potassium 4 BUN 48 creatinine 1.3.  12/16/2014.  Liver function tests within normal limits except albumin of 3.4.        ASSESSMENT/PLAN:   vaginal discomfort-appears she does have some atrophy Will treat with Estrace cream 1 g 2 times a week and monitor this helps also will obtain a UA CNS with her history of UTIs    .    Marland Kitchen    Chronic renal insufficiency stage-  This appears relatively baseline with creatinine 1.29-it had been up over 1.6 recently this appears to vary att imes -- will update his next week                                                              History of diastolic CHF she is on significant dose of  Demadex at 80 mg every morning 60 mg every afternoon with potassium supplementation-her weight appears to have stabilized--actually gone down a bit which I suspect fluid related at this point monitor again will update a metabolic panel next week     hemorrhoid discomfort- will prescribe Anusol when necessary and monitor  [id number] -- of note greater than 30 minutes spent assessing patient-reviewing her chart-discussing her status with nursing staff- and coordinating formulating a plan of care for numerous diagnoses-- of note greater than 50% of time spent coordinating plan of care

## 2015-05-20 ENCOUNTER — Encounter (HOSPITAL_COMMUNITY)
Admission: RE | Admit: 2015-05-20 | Discharge: 2015-05-20 | Disposition: A | Discharge status: Home / Self Care | Payer: Medicare Other | Source: Skilled Nursing Facility | Attending: Internal Medicine | Admitting: Internal Medicine

## 2015-05-20 DIAGNOSIS — N184 Chronic kidney disease, stage 4 (severe): Secondary | ICD-10-CM | POA: Diagnosis not present

## 2015-05-20 LAB — URINALYSIS, ROUTINE W REFLEX MICROSCOPIC
Bilirubin Urine: NEGATIVE
GLUCOSE, UA: NEGATIVE mg/dL
Ketones, ur: NEGATIVE mg/dL
Nitrite: NEGATIVE
PH: 6 (ref 5.0–8.0)
Protein, ur: NEGATIVE mg/dL
SPECIFIC GRAVITY, URINE: 1.01 (ref 1.005–1.030)
Urobilinogen, UA: 0.2 mg/dL (ref 0.0–1.0)

## 2015-05-20 LAB — URINE MICROSCOPIC-ADD ON

## 2015-05-22 LAB — URINE CULTURE

## 2015-05-24 ENCOUNTER — Non-Acute Institutional Stay: Payer: Medicare Other | Admitting: Internal Medicine

## 2015-05-24 ENCOUNTER — Encounter: Payer: Self-pay | Admitting: Internal Medicine

## 2015-05-24 DIAGNOSIS — L299 Pruritus, unspecified: Secondary | ICD-10-CM | POA: Diagnosis not present

## 2015-05-24 DIAGNOSIS — J441 Chronic obstructive pulmonary disease with (acute) exacerbation: Secondary | ICD-10-CM

## 2015-05-24 DIAGNOSIS — E1121 Type 2 diabetes mellitus with diabetic nephropathy: Secondary | ICD-10-CM | POA: Diagnosis not present

## 2015-05-24 DIAGNOSIS — R635 Abnormal weight gain: Secondary | ICD-10-CM | POA: Diagnosis not present

## 2015-05-24 DIAGNOSIS — R627 Adult failure to thrive: Secondary | ICD-10-CM | POA: Diagnosis not present

## 2015-05-24 DIAGNOSIS — I10 Essential (primary) hypertension: Secondary | ICD-10-CM

## 2015-05-24 DIAGNOSIS — I5033 Acute on chronic diastolic (congestive) heart failure: Secondary | ICD-10-CM

## 2015-05-24 NOTE — Progress Notes (Signed)
Patient ID: [name], female   DOB: Jul 07, 1922, 81 y.o.   MRN: [id number]                   DATE: 04/26/2015            FACILITY: [facility]                             LEVEL OF CARE:   SNF   CHIEF COMPLAINT:   Medical management of chronic medical conditions including COPD-diastolic CHFCVA-renal insufficiency-diabetes type 2-GERD-depression-history of agitation-- acute visit secondary to generalized itching                        HISTORY OF PRESENT ILLNESS:  This is a patient who is a longstanding resident in the facility, dating back to April of last year.   She has a complex medical history as noted above-earlier this year she did go to the ER with altered mental status    An MRI of the brain showed an acute infarction in the left inferior body of the caudate.  The family refused any further work-up and even the patient, I think, had said she did not want any work-up.  Her aspirin 81 mg was increased to 325.     It was noted that she was bradycardic with heart rates in the 40s.  Her metoprolol was decreased from 50 twice a day to 25 twice a day.  It is has been downgraded to 12.5 mg twice a day pulse rates appear to be stable.     The patient has been in the building since April 2015.  She was admitted at that point with, I believe, COPD acute.    She also has diastolic heart failure, pulmonary hypertension. She is currently on Demadex 80 mg in the morning and 60 mg later in the day-this was recently increased secondary to weight gain with her weight rising up into the upper 190s it is now 191.6  edema does appear to be improved.  S  Last month Her daughter has requested a medication review in an attempt to minimize medications There was concern of sedation-she also had failure thrive concerns with poor appetite increased confusion-she appeared to be declining.  Medication adjustments were made her when necessary Haldol was discontinued-she was  continued on Depakote secondary to history of behaviors.  Her Benadryl also was discontinued.  Over the last few weeks patient actually has improved she is more alert is eating and drinking better appears to be back more like her previous self.  However she has developed significant generalized itching-I did discuss this with her daughter via phone today and she is okay with restarting Benadryl we'll start this at a lower dose however and see how she does  She was also seen last week for some itching more sort of vaginal area and has been started on Estrace cream apparently this is helping a urine analysis and culture was ordered as well secondary due again some complaints of possible dysuria and  has grown out greater than 100,000 colonies of Escherichia coli  In regards to other issues She has a significant COPD history on duo nebs as well as Mucinex.  She also is a type II diabetic on low-dose Lantus.--This has been stable for some time      PAST MEDICAL HISTORY/PROBLEM LIST:  Osteoarthritis.     Lower extremity weakness, essentially nonambulatory.    Community-acquired pneumonia.    History of breast cancer, status post left mastectomy.    Hypertension.    Type 2 diabetes.     Chronic COPD.    Carpal tunnel syndrome.    Stress urinary incontinence.    Dysphagia.    Acute-on-chronic diastolic heart failure.    Chronic renal failure with a baseline creatinine of around 1.6.      CURRENT MEDICATIONS:  Discharge medications on return from the hospital:    Enteric-coated aspirin 325 q.d.     Zyloprim 100 q.d. for gout.    Norvasc 10 q.d.      Cranberry q.12.     Guaifenesin 600 b.i.d.           Humalog sliding scale.      Metoprolol 12.5   b.i.d.       Paxil 30 q.d.       Potassium 40 b.i.d.         Zantac 150 q.d.          Demadex 80 mg every morning-60 mg every afternoon.        Proventil 2 puffs q.6 hours p.r.n.          Tylenol 650 q.6 p.r.n.         Tessalon three times daily p.r.n.      .     Lantus 5 U at bedtime.                                                                                                                 SOCIAL HISTORY:                   HOUSING:  The patient, I believe was previously at an assisted living.   ADVANCED DIRECTIVES:  She does not have any advanced directives.   FUNCTIONAL STATUS:  She spends most of her time in a wheelchair now.  I.    REVIEW OF SYSTEMS: limited since patient is a poor historian but per nursing appears to be stable eating and drinking better more alert talkative.  In general there been no fever or chills noted.  Skin  She does have a history of somewhat chronic generalized itching.  Head ears eyes nose mouth and throat again she has significant visual impairment which is not new does not complain of sore throat                CHEST/RESPIRATORY:  She does not complain of cough or shortness of breath.     CARDIAC:  No chest pain.    GI:  No abdominal pain.   GU history of UTIs?? dysuria today she had complain of some vaginal itching last week and has been started on Estrace cream--this appears to be helping  NEUROLOGICAL:  No headache or dizziness noted.    MUSCULOSKELETAL:  She is complaining of right shoulder weakness and pain.  --This has been chronic. Psych she  has had some increased behaviors which have moderated some still has occasional periods of agitation but apparently the Depakote has been beneficial--she has been more alert and interactive last few weeks   PHYSICAL EXAMINATION :   VITAL SIGNS:     T She is afebrile pulses 70 respirations 24 blood pressure 152/62 --weight is 191.6     GENERAL APPEARANCE: This is an obese elderly female in no distress Resting comfortably in her wheelchair she is pleasant and cooperative talking  Her skin is warm and dry did not note any rashes on her back she has numerous seborrheic keratoses  diffuse--.  Eyes appear reactive to light again she has diminished visual acuity which is not new sclera and conjunctiva are clear.  Oropharynx is clear mucous membranes are fairly moist.   CHEST/RESPIRATORY: Shallow but clear air entry bilaterally.     CARDIOVASCULAR:   CARDIAC:   Regular irregular rate and rhythm without murmur gallop or rub she has chronic I would say1- 2+ lower extremity edema bilaterally --she has compression hose on this actually appears to be improved from previous exam    some degree of venous insufficiency.  No clear signs of congestive heart failure.   GASTROINTESTINAL:   ABDOMEN:  No masses.  There is obesity, but no tenderness. bowel sounds are positive                 MUSCULOSKELETAL:    EXTREMITIES:    Results release 4 with lower extremity weakness largely ambulates in a wheelchair limited range of motion of her right shoulder which is not new    BILATERAL LOWER EXTREMITIES:  She has significant osteoarthritis of both knees generalized weakness.   NEUROLOGICAL:              SENSATION/STRENGTH:   She appears to have right arm weakness, although this may relate to shoulder pain more than anything else.  I cannot detect any other lateralizing findings.  She has anti-gravity strength in her legs. Neurologic her speech is clear .  Psych she is oriented to self this pleasant does follow simple verbal commands-keeps asking "How am I  doing"    Labs.  05/19/2015.  Sodium 142 potassium 3.8 BUN 37 creatinine 1.29.  04/28/2015.  Liver function tests within normal limits except ALT 12.  WBC 10.9 hemoglobin 13.5 platelets 165  03/05/2015.  WBC 12.1 hemoglobin 12.3 platelets 246.  Sodium 138 potassium 4 BUN 48 creatinine 1.3.  12/16/2014.  Liver function tests within normal limits except albumin of 3.4.        ASSESSMENT/PLAN:  Generalized itching this is not a new issue however Benadryl has been helpful in the past were concerns about  sedation although unclear whether this was truly causing it in her case-I discussed this with her daughter via phone and will restart Benadryl at a lower dose 12.5 mg every 8 hours when necessary monitor for sedation.  UTI-I have checked her culture is positive for Escherichia coli Will treat with Cipro 250 mg twice a day for 7 days E Coli is sensitive to Cipro   Failure to thrive? Weight loss--this has stabilized as suspect recent weight loss tfluid related-she appears to be doing well more alert is eating and drinking better .                                 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  This may account  for some weakness of the right arm. Otherwise no lateralizing findings she continues on aspirin   sion, mitral stenosis.  This does not appear to be unstable.    .    Chronic renal insufficiency stage IV.    Appears stable recent creatinine 1.29 BUN of 37 .    Sinus bradycardia.    Her metoprolol has been reduced recent pulses appear stable    Hypertensionon Lopressor as well as Norvasc--I see occasional systolic spikes 161W most recently but this does not appear to be consistent.    History COPD this has been stable recently she is on when necessary nebulizers as well as inhaler she also is on routine Mucinex.    History of diastolic CHF she is on significant dose of Demadex 60 mg twice a day with aggressive potassium supplementation with history of low potassium currently on 40 mEq twice a day Will update a metabolic panel.                                                                                       History of dementia with agitation-this appears to have stabilized on Depakote-again Haldol has been discontinued    History of gout she does continue on enalapril and all this is been stable without recent flares.    History diabetes type 2 she is on low-dose Lantus as well as sliding scale.--CBGs run in the lower to mid 100s --update hemoglobin A1c                              History of hyperlipidemia --she is now off a statin per family wishes to minimize medications    History of depression she is on Paxil she is followed by psychiatric services.                                           History of GERD she continues on Zantac --this has been stable    History of anemia I suspect there is an element of chronic renal insufficiency  actually recent hemoglobin was13.5 Will update this as well.    [id number]--of note greater than 40 minutes spent assessing patient-reviewing her chart-discussing her status with her daughter -and coordinating and formulating a plan of care with family in put-of note greater than 50% of time spent coordinating plan of care.for numerous diafnosis

## 2015-05-25 ENCOUNTER — Encounter (HOSPITAL_COMMUNITY)
Admission: AD | Admit: 2015-05-25 | Discharge: 2015-05-25 | Disposition: A | Discharge status: Home / Self Care | Payer: Medicare Other | Source: Skilled Nursing Facility | Attending: Internal Medicine | Admitting: Internal Medicine

## 2015-05-25 DIAGNOSIS — N184 Chronic kidney disease, stage 4 (severe): Secondary | ICD-10-CM | POA: Diagnosis not present

## 2015-05-25 LAB — BASIC METABOLIC PANEL
Anion gap: 10 (ref 5–15)
BUN: 45 mg/dL — ABNORMAL HIGH (ref 6–20)
CO2: 25 mmol/L (ref 22–32)
Calcium: 9.2 mg/dL (ref 8.9–10.3)
Chloride: 104 mmol/L (ref 101–111)
Creatinine, Ser: 1.2 mg/dL — ABNORMAL HIGH (ref 0.44–1.00)
GFR calc non Af Amer: 39 mL/min — ABNORMAL LOW (ref >60)
GFR, EST AFRICAN AMERICAN: 45 mL/min — AB (ref >60)
Glucose, Bld: 124 mg/dL — ABNORMAL HIGH (ref 65–99)
POTASSIUM: 3.6 mmol/L (ref 3.5–5.1)
SODIUM: 139 mmol/L (ref 135–145)

## 2015-05-25 LAB — CBC WITH DIFFERENTIAL/PLATELET
BASOS ABS: 0.1 10*3/uL (ref 0.0–0.1)
Basophils Relative: 1 %
EOS ABS: 0.7 10*3/uL (ref 0.0–0.7)
Eosinophils Relative: 8 %
HCT: 41.8 % (ref 36.0–46.0)
Hemoglobin: 13.5 g/dL (ref 12.0–15.0)
LYMPHS PCT: 22 %
Lymphs Abs: 1.8 10*3/uL (ref 0.7–4.0)
MCH: 28.5 pg (ref 26.0–34.0)
MCHC: 32.3 g/dL (ref 30.0–36.0)
MCV: 88.2 fL (ref 78.0–100.0)
Monocytes Absolute: 0.7 10*3/uL (ref 0.1–1.0)
Monocytes Relative: 9 %
NEUTROS PCT: 60 %
Neutro Abs: 5.2 10*3/uL (ref 1.7–7.7)
PLATELETS: 209 10*3/uL (ref 150–400)
RBC: 4.74 MIL/uL (ref 3.87–5.11)
RDW: 16 % — ABNORMAL HIGH (ref 11.5–15.5)
WBC: 8.5 10*3/uL (ref 4.0–10.5)

## 2015-05-26 ENCOUNTER — Encounter (HOSPITAL_COMMUNITY)
Admission: RE | Admit: 2015-05-26 | Discharge: 2015-05-26 | Disposition: A | Discharge status: Home / Self Care | Payer: Medicare Other | Source: Skilled Nursing Facility | Attending: Internal Medicine | Admitting: Internal Medicine

## 2015-05-26 DIAGNOSIS — N184 Chronic kidney disease, stage 4 (severe): Secondary | ICD-10-CM | POA: Diagnosis not present

## 2015-05-26 LAB — BASIC METABOLIC PANEL
ANION GAP: 9 (ref 5–15)
BUN: 49 mg/dL — AB (ref 6–20)
CALCIUM: 8.9 mg/dL (ref 8.9–10.3)
CO2: 26 mmol/L (ref 22–32)
CREATININE: 1.68 mg/dL — AB (ref 0.44–1.00)
Chloride: 104 mmol/L (ref 101–111)
GFR calc Af Amer: 30 mL/min — ABNORMAL LOW (ref >60)
GFR calc non Af Amer: 26 mL/min — ABNORMAL LOW (ref >60)
GLUCOSE: 126 mg/dL — AB (ref 65–99)
Potassium: 4.2 mmol/L (ref 3.5–5.1)
Sodium: 139 mmol/L (ref 135–145)

## 2015-05-31 ENCOUNTER — Encounter (HOSPITAL_COMMUNITY)
Admission: AD | Admit: 2015-05-31 | Discharge: 2015-05-31 | Disposition: A | Discharge status: Home / Self Care | Payer: Medicare Other | Source: Skilled Nursing Facility | Attending: Internal Medicine | Admitting: Internal Medicine

## 2015-05-31 DIAGNOSIS — N184 Chronic kidney disease, stage 4 (severe): Secondary | ICD-10-CM | POA: Diagnosis not present

## 2015-05-31 LAB — BASIC METABOLIC PANEL
Anion gap: 8 (ref 5–15)
BUN: 49 mg/dL — AB (ref 6–20)
CHLORIDE: 100 mmol/L — AB (ref 101–111)
CO2: 29 mmol/L (ref 22–32)
Calcium: 8.6 mg/dL — ABNORMAL LOW (ref 8.9–10.3)
Creatinine, Ser: 1.46 mg/dL — ABNORMAL HIGH (ref 0.44–1.00)
GFR calc non Af Amer: 30 mL/min — ABNORMAL LOW (ref >60)
GFR, EST AFRICAN AMERICAN: 35 mL/min — AB (ref >60)
Glucose, Bld: 120 mg/dL — ABNORMAL HIGH (ref 65–99)
POTASSIUM: 3.9 mmol/L (ref 3.5–5.1)
SODIUM: 137 mmol/L (ref 135–145)

## 2015-06-03 ENCOUNTER — Other Ambulatory Visit (HOSPITAL_COMMUNITY)
Admission: AD | Admit: 2015-06-03 | Discharge: 2015-06-03 | Disposition: A | Discharge status: Home / Self Care | Payer: Medicare Other | Source: Skilled Nursing Facility | Attending: Internal Medicine | Admitting: Internal Medicine

## 2015-06-03 DIAGNOSIS — N184 Chronic kidney disease, stage 4 (severe): Secondary | ICD-10-CM | POA: Insufficient documentation

## 2015-06-03 LAB — BASIC METABOLIC PANEL
Anion gap: 6 (ref 5–15)
BUN: 57 mg/dL — AB (ref 6–20)
CO2: 28 mmol/L (ref 22–32)
CREATININE: 1.48 mg/dL — AB (ref 0.44–1.00)
Calcium: 8.4 mg/dL — ABNORMAL LOW (ref 8.9–10.3)
Chloride: 103 mmol/L (ref 101–111)
GFR calc Af Amer: 35 mL/min — ABNORMAL LOW (ref >60)
GFR, EST NON AFRICAN AMERICAN: 30 mL/min — AB (ref >60)
Glucose, Bld: 132 mg/dL — ABNORMAL HIGH (ref 65–99)
Potassium: 4.1 mmol/L (ref 3.5–5.1)
Sodium: 137 mmol/L (ref 135–145)

## 2015-06-09 ENCOUNTER — Non-Acute Institutional Stay: Payer: Medicare Other | Admitting: Internal Medicine

## 2015-06-09 ENCOUNTER — Ambulatory Visit (HOSPITAL_COMMUNITY)
Admission: RE | Admit: 2015-06-09 | Discharge: 2015-06-09 | Disposition: A | Discharge status: Home / Self Care | Payer: Medicare Other | Source: Ambulatory Visit | Attending: Internal Medicine | Admitting: Internal Medicine

## 2015-06-09 ENCOUNTER — Other Ambulatory Visit: Payer: Self-pay | Admitting: Internal Medicine

## 2015-06-09 DIAGNOSIS — R05 Cough: Secondary | ICD-10-CM

## 2015-06-09 DIAGNOSIS — J441 Chronic obstructive pulmonary disease with (acute) exacerbation: Secondary | ICD-10-CM

## 2015-06-09 DIAGNOSIS — R059 Cough, unspecified: Secondary | ICD-10-CM

## 2015-06-09 DIAGNOSIS — I5032 Chronic diastolic (congestive) heart failure: Secondary | ICD-10-CM | POA: Diagnosis not present

## 2015-06-09 DIAGNOSIS — J449 Chronic obstructive pulmonary disease, unspecified: Secondary | ICD-10-CM | POA: Insufficient documentation

## 2015-06-09 DIAGNOSIS — I517 Cardiomegaly: Secondary | ICD-10-CM | POA: Diagnosis not present

## 2015-06-09 NOTE — Progress Notes (Signed)
Patient ID: [name], female   DOB: 1922/05/15, 81 y.o.   MRN: [id number]                             FACILITY: [facility]                             LEVEL OF CARE:   SNF   CHIEF COMPLAINT:   Acute visit secondary to cough                        HISTORY OF PRESENT ILLNESS:  This is a patient who is a longstanding resident in the facility,       The patient has been in the building since April 2015.  She was admitted at that point with, I believe, COPD acute.    She also has diastolic heart failure, pulmonary hypertension. She is currently on Demadex 80 mg in the morning and 60 mg later in the day-this was recently increased secondary to weight gain with her weight rising up into the upper 190s it is now 191.4  edema does appear to be improved.  She does have a history of COPD she is on nebulizers-nursing staff has noted a dry cough-she does not complain of any shortness of breath-she is on Mucinex as well as when necessary nebulizers and Proventil  Vital signs are stable O2 saturations are in the 90s           PAST MEDICAL HISTORY/PROBLEM LIST:                            Osteoarthritis.     Lower extremity weakness, essentially nonambulatory.    Community-acquired pneumonia.    History of breast cancer, status post left mastectomy.    Hypertension.    Type 2 diabetes.     Chronic COPD.    Carpal tunnel syndrome.    Stress urinary incontinence.    Dysphagia.    Acute-on-chronic diastolic heart failure.    Chronic renal failure with a baseline creatinine of around 1.6.      CURRENT MEDICATIONS:  Discharge medications on return from the hospital:    Enteric-coated aspirin 325 q.d.     Zyloprim 100 q.d. for gout.    Norvasc 10 q.d.      Cranberry q.12.     Guaifenesin 600 b.i.d.           Humalog sliding scale.      Metoprolol 12.5   b.i.d.       Paxil 30 q.d.       Potassium 40 b.i.d.         Zantac 150 q.d.           Demadex 80 mg every morning-60 mg every afternoon.        Proventil 2 puffs q.6 hours p.r.n.         Tylenol 650 q.6 p.r.n.         Tessalon three times daily p.r.n.      .     Lantus 5 U at bedtime.  SOCIAL HISTORY:                   HOUSING:  The patient, I believe was previously at an assisted living.   ADVANCED DIRECTIVES:  She does not have any advanced directives.   FUNCTIONAL STATUS:  She spends most of her time in a wheelchair now.  I.    REVIEW OF SYSTEMS: limited since patient is a poor historian but per nursing appears to be stable --have noticed a dry cough no complaint shortness of breath or chest pain.  In general there been no fever or chills noted.   Marland Kitchen  Head ears eyes nose mouth and throat again she has significant visual impairment which is not new does not complain of sore throat                CHEST/RESPIRATORY:  She does not complain of cough or shortness of breath.     CARDIAC:  No chest pain.    GI:  No abdominal pain.      PHYSICAL EXAMINATION :   VITAL SIGNS:      Temperature 98.6 pulse 82 respirations 24 weight is 191.4    GENERAL APPEARANCE: This is an obese elderly female in no distress Resting comfortably in her wheelchair she is pleasant and cooperative talking  Her skin is warm and dry did not note any rashes on her back she has numerous seborrheic keratoses diffuse--.  Eyes appear reactive to light again she has diminished visual acuity which is not new sclera and conjunctiva are clear.  Oropharynx is clear mucous membranes are fairly moist.   CHEST/RESPIRATORY: Shallow air entry she does have a cough with deep inspiration there is minimal wheeze the right upper lung area.     CARDIOVASCULAR:   CARDIAC:   Regular irregular rate and rhythm without murmur gallop or rub she has chronic I would say1- 2+ lower extremity edema  bilaterally --     some degree of venous insufficiency.  No clear signs of congestive heart failure.   GASTROINTESTINAL:   ABDOMEN:  No masses.  There is obesity, but no tenderness. bowel sounds are positive                 MUSCULOSKELETAL:    EXTREMITIES:    Results release 4 with lower extremity weakness largely ambulates in a wheelchair limited range of motion of her right shoulder which is not new    BILATERAL LOWER EXTREMITIES:  She has significant osteoarthritis of both knees generalized weakness.    Marland Kitchen  Psych she is oriented to self this pleasant does follow simple verbal commands-keeps asking "How am I  doing"    Labs.  06/03/2015.  Sodium 137 potassium 4.1 BUN 57 creatinine 1.48.  05/25/2015.  WBC 8.5 hemoglobin 13.5 platelets 209   05/19/2015.  Sodium 142 potassium 3.8 BUN 37 creatinine 1.29.  04/28/2015.  Liver function tests within normal limits except ALT 12.  WBC 10.9 hemoglobin 13.5 platelets 165  03/05/2015.  WBC 12.1 hemoglobin 12.3 platelets 246.  Sodium 138 potassium 4 BUN 48 creatinine 1.3.  12/16/2014.  Liver function tests within normal limits except albumin of 3.4.        ASSESSMENT/PLAN  Dry cough-clinically she appears to be at baseline certainly no sign of distress with her history will check a chest x-ray she continues on Mucinex 600 mg twice a day as well as when necessary nebulizers and Proventil.--These will have to be encouraged-she will have to be  monitored closely but she looks to be doing well  :       History COPD this has been stable recently she is on when necessary nebulizers as well as inhaler she also is on routine Mucinex.    History of diastolic CHF she is on significant dose of Demadex 60 mg twice a day with aggressive potassium supplementation with history of low potassium currently on 40 mEq twice a day--her weight has been stable edema appears to be at baseline at this point continue to  monitor  [id number]

## 2015-06-12 ENCOUNTER — Encounter (HOSPITAL_COMMUNITY)
Admission: RE | Admit: 2015-06-12 | Discharge: 2015-06-12 | Disposition: A | Discharge status: Home / Self Care | Payer: Medicare Other | Source: Skilled Nursing Facility | Attending: Internal Medicine | Admitting: Internal Medicine

## 2015-06-12 DIAGNOSIS — I10 Essential (primary) hypertension: Secondary | ICD-10-CM | POA: Diagnosis not present

## 2015-06-12 LAB — CBC WITH DIFFERENTIAL/PLATELET
BASOS PCT: 1 %
Basophils Absolute: 0.1 10*3/uL (ref 0.0–0.1)
EOS ABS: 0.9 10*3/uL — AB (ref 0.0–0.7)
Eosinophils Relative: 10 %
HEMATOCRIT: 33.7 % — AB (ref 36.0–46.0)
HEMOGLOBIN: 10.5 g/dL — AB (ref 12.0–15.0)
Lymphocytes Relative: 17 %
Lymphs Abs: 1.5 10*3/uL (ref 0.7–4.0)
MCH: 28.2 pg (ref 26.0–34.0)
MCHC: 31.2 g/dL (ref 30.0–36.0)
MCV: 90.3 fL (ref 78.0–100.0)
Monocytes Absolute: 0.9 10*3/uL (ref 0.1–1.0)
Monocytes Relative: 11 %
NEUTROS ABS: 5.1 10*3/uL (ref 1.7–7.7)
NEUTROS PCT: 61 %
Platelets: 161 10*3/uL (ref 150–400)
RBC: 3.73 MIL/uL — AB (ref 3.87–5.11)
RDW: 16.4 % — ABNORMAL HIGH (ref 11.5–15.5)
WBC: 8.5 10*3/uL (ref 4.0–10.5)

## 2015-06-12 LAB — BASIC METABOLIC PANEL
ANION GAP: 8 (ref 5–15)
BUN: 45 mg/dL — ABNORMAL HIGH (ref 6–20)
CALCIUM: 8.1 mg/dL — AB (ref 8.9–10.3)
CHLORIDE: 102 mmol/L (ref 101–111)
CO2: 26 mmol/L (ref 22–32)
CREATININE: 1.29 mg/dL — AB (ref 0.44–1.00)
GFR calc non Af Amer: 35 mL/min — ABNORMAL LOW (ref >60)
GFR, EST AFRICAN AMERICAN: 41 mL/min — AB (ref >60)
Glucose, Bld: 143 mg/dL — ABNORMAL HIGH (ref 65–99)
Potassium: 4.1 mmol/L (ref 3.5–5.1)
SODIUM: 136 mmol/L (ref 135–145)

## 2015-06-18 DIAGNOSIS — R05 Cough: Secondary | ICD-10-CM | POA: Insufficient documentation

## 2015-06-18 DIAGNOSIS — R059 Cough, unspecified: Secondary | ICD-10-CM | POA: Insufficient documentation

## 2015-08-09 ENCOUNTER — Encounter: Payer: Self-pay | Admitting: Internal Medicine

## 2015-08-09 ENCOUNTER — Non-Acute Institutional Stay: Payer: Medicare Other | Admitting: Internal Medicine

## 2015-08-09 DIAGNOSIS — I10 Essential (primary) hypertension: Secondary | ICD-10-CM | POA: Diagnosis not present

## 2015-08-09 DIAGNOSIS — J9601 Acute respiratory failure with hypoxia: Secondary | ICD-10-CM

## 2015-08-09 DIAGNOSIS — R627 Adult failure to thrive: Secondary | ICD-10-CM

## 2015-08-09 DIAGNOSIS — E1121 Type 2 diabetes mellitus with diabetic nephropathy: Secondary | ICD-10-CM

## 2015-08-09 DIAGNOSIS — I5032 Chronic diastolic (congestive) heart failure: Secondary | ICD-10-CM | POA: Diagnosis not present

## 2015-08-09 NOTE — Progress Notes (Signed)
Patient ID: [name], female   DOB: 1921-12-29, 81 y.o.   MRN: [id number]                    DATE: 08/09/2015          FACILITY: [facility]                             LEVEL OF CARE:   SNF   CHIEF COMPLAINT:   Medical management of chronic medical conditions including COPD-diastolic CHFCVA-renal insufficiency-diabetes type 2-GERD-depression-history of agitation-- acute visit secondary to generalized itching                        HISTORY OF PRESENT ILLNESS:  This is a patient who is a longstanding resident in the facility, dating back to April of last year.   She has a complex medical history as noted above-earlier this year she did go to the ER with altered mental status    An MRI of the brain showed an acute infarction in the left inferior body of the caudate.  The family refused any further work-up and even the patient, I think, had said she did not want any work-up.  Her aspirin 81 mg was increased to 325.     It was noted that she was bradycardic with heart rates in the 40s.  Her metoprolol was decreased from 50 twice a day to 25 twice a day.  It is has been downgraded to 12.5 mg twice a day pulse rates appear to be stable.     The patient has been in the building since April 2015.  She was admitted at that point with, I believe, COPD acute.    She also has diastolic heart failure, pulmonary hypertension. She is currently on Demadex 80 mg in the morning and 60 mg later in the day- Her weight currently is 197.6 on low this appears to be relatively baseline with recent labs over the past few weeks-her baseline has been in the 190s more in the lower 190s although she appears to be stable  S  Earlier this year... Her daughter had requested a medication review in an attempt to minimize medications There was concern of sedation-she also had failure thrive concerns with poor appetite increased confusion-she appeared to be declining.  Medication adjustments  were made her when necessary Haldol was discontinued-she was continued on Depakote secondary to history of behaviors.   Actually her clinical status has significantly improved she is more bright alert and interactive. This has now persisted for some time.  She is followed by psychiatric services and thought be doing well she is on Depakote 125 mg twice a day as well as Paxil and again this is followed by psychiatric services    In regards to other issues She has a significant COPD history on duo nebs as well as Mucinex.  She also is a type II diabetic on low-dose Lantus.--This has been stable for some time--CBGs consistently in the mid 100s      PAST MEDICAL HISTORY/PROBLEM LIST:                            Osteoarthritis.     Lower extremity weakness, essentially nonambulatory.    Community-acquired pneumonia.    History of breast cancer, status post left mastectomy.    Hypertension.    Type  2 diabetes.     Chronic COPD.    Carpal tunnel syndrome.    Stress urinary incontinence.    Dysphagia.    Acute-on-chronic diastolic heart failure.    Chronic renal failure with a baseline creatinine of around 1.6.      CURRENT MEDICATIONS:  Discharge medications on return from the hospital:    Enteric-coated aspirin 325 q.d.     Zyloprim 100 q.d. for gout.    Norvasc 10 q.d.      Cranberry q.12.     Guaifenesin 600 b.i.d.           Humalog sliding scale.      Metoprolol 12.5   b.i.d.       Paxil 30 q.d.       Potassium 40 b.i.d.         Zantac 150 q.d.          Demadex 80 mg every morning-60 mg every afternoon.        Proventil 2 puffs q.6 hours p.r.n.         Tylenol 650 q.6 p.r.n.         Tessalon three times daily p.r.n.      .     Lantus 5 U at bedtime.                                                                                                                 SOCIAL HISTORY:                   HOUSING:  The patient, I believe was previously at an  assisted living.   ADVANCED DIRECTIVES:  She does not have any advanced directives.   FUNCTIONAL STATUS:  She spends most of her time in a wheelchair now.  I.    REVIEW OF SYSTEMS: limited since patient is a poor historian but per nursing appears to be stable eating and drinking better more alert talkative.  In general there been no fever or chills noted.--Some mild weight gain I suspect this is more due to appetite  Skin  She does have a history of somewhat chronic generalized itching but this has not been an issue recently.  Head ears eyes nose mouth and throat again she has significant visual impairment which is not new does not complain of sore throat                CHEST/RESPIRATORY:  She does not complain of cough or shortness of breath.     CARDIAC:  No chest pain.    GI:  No abdominal pain.   GU history of UTIs Treated for Escherichia coli back in September this is been stable relatively since then NEUROLOGICAL:  No headache or dizziness noted.    MUSCULOSKELETAL:  Some chronic right shoulder pain but this appears to be controlled at this time does not really complaining of that this evening Psych  Has had a history of behaviors in the past but this has been quite stable for  some time which is quite encouraging--no recent significant behaviors to my knowledge   PHYSICAL EXAMINATION :   VITAL SIGNS:     T- 98.3 pulse 80 respirations 18 blood pressure -140/66 manually tonight-I see variable blood pressures ranging from 156/88-128/52-I do not see consistent elevations weight is 197.6 this is on the higher end of her baseline however since her mental status has improved apparently she is eating better per chart review and this may be converting to some weight gain here     GENERAL APPEARANCE: This is an obese elderly female in no distress lying comfortably in her recliner she is bright alert oriented to self conversational   Her skin is warm and dry did not note any rashes on her  back she has numerous seborrheic keratoses diffuse--.  Eyes appear reactive to light again she has diminished visual acuity which is not new sclera and conjunctiva are clear.  Oropharynx is clear mucous membranes are fairly moist.   CHEST/RESPIRATORY: Shallow but clear air entry bilaterally.     CARDIOVASCULAR:   CARDIAC:   Regular irregular rate and rhythm without murmur gallop or rub she has chronic I would say1- 2+ lower extremity edema bilaterally this appears stable from last exam    some degree of venous insufficiency.  No clear signs of congestive heart failure.   GASTROINTESTINAL:   ABDOMEN:  No masses.  There is obesity, but no tenderness. bowel sounds are positive                 MUSCULOSKELETAL:    EXTREMITIES:    Moves all ext  4 with lower extremity weakness largely ambulates in a wheelchair limited range of motion of her right shoulder which is not new    BILATERAL LOWER EXTREMITIES:  She has significant osteoarthritis of both knees generalized weakness.   NEUROLOGICAL:              SENSATION/STRENGTH:   She appears to have right arm weakness, although this may relate to shoulder pain more than anything else.  I cannot detect any other lateralizing findings.  She has anti-gravity strength in her legs. Neurologic her speech is clear .  Psych she is oriented to self this pleasant does follow simple verbal commands--asked me tonight if I think she'll make it to 100-again she is significantly improved in this regards over the past several months    Labs.  06/12/2015.  Sodium 136 potassium 4.1 BUN 45 creatinine 1.29.  WBC 8.5 hemoglobin 10.5 platelets 161.    05/19/2015.  Sodium 142 potassium 3.8 BUN 37 creatinine 1.29.  04/28/2015.  Liver function tests within normal limits except ALT 12.  WBC 10.9 hemoglobin 13.5 platelets 165  03/05/2015.  WBC 12.1 hemoglobin 12.3 platelets 246.  Sodium 138 potassium 4 BUN 48 creatinine 1.3.  12/16/2014.  Liver  function tests within normal limits except albumin of 3.4.        ASSESSMENT/PLAN:      Failure to thrive? Weight loss- This actually has significantly improved she is eating better more alert and interactive-again medication adjustments were made which appeared to have had a beneficial effect .                                 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  This may account for some weakness of the right arm. Otherwise no lateralizing findings she continues on aspirin   , mitral  stenosis.  This does not appear to be unstable.    .    Chronic renal insufficiency stage IV.    Appears stable recent creatinine 1.29 BUN of 45--will update this .    Sinus bradycardia.    Her metoprolol has been reduced recent pulses appear stable                                               Hypertensionon--on Lopressor as well as Norvasc-as noted above I do not see consistent elevations at this point will monitor    History COPD this has been stable recently she is on when necessary nebulizers as well as inhaler she also is on routine Mucinex.                                                         History of diastolic CHF she is on significant dose of Demadex 80 mg in the morning 60 mg in the p.m.-she is also on 40 mEq twice a day of potassium-she has gained  a mild amount of weight sinceour last visit however this may reflect more increased appetite as noted above edema appears to be at baseline she has significant venous stasis-at this point will monitor again will update a metabolic panel                                                                                       History of dementia with agitation-this appears to have stabilized on Depakote-again Haldol has been discontinued    History of gout she does continue on allopurinol l this is been stable without recent flares.    History diabetes type 2 she is on low-dose Lantus as well as sliding scale.- --update  hemoglobin A1c--CBGs appear to be satisfactory quite stable in the mid 100s a.m. and p.m.                             History of hyperlipidemia --she is now off a statin per family wishes to minimize medications    History of depression she is on Paxil--is doing quite well-- she is followed by psychiatric services.                                           History of GERD she continues on Zantac --this has been stable    History of anemia I suspect there is an element of chronic renal insufficiency  ,HBG most recently in October was 10.5 which is on the lower end of her baseline baseline appears to run 11-13 range will update this.              [id number]--  of note  greater than 35 minutes spent assessing patient reviewing her chart-and coordinating and formulating a plan of care for numerous diagnoses-of note greater than 50% of time spent coordinating plan of care

## 2015-08-10 ENCOUNTER — Encounter (HOSPITAL_COMMUNITY)
Admission: AD | Admit: 2015-08-10 | Discharge: 2015-08-10 | Disposition: A | Discharge status: Home / Self Care | Payer: Medicare Other | Source: Skilled Nursing Facility | Attending: Internal Medicine | Admitting: Internal Medicine

## 2015-08-10 DIAGNOSIS — N184 Chronic kidney disease, stage 4 (severe): Secondary | ICD-10-CM | POA: Insufficient documentation

## 2015-08-10 DIAGNOSIS — I5032 Chronic diastolic (congestive) heart failure: Secondary | ICD-10-CM | POA: Diagnosis not present

## 2015-08-10 DIAGNOSIS — N390 Urinary tract infection, site not specified: Secondary | ICD-10-CM | POA: Diagnosis present

## 2015-08-10 LAB — CBC WITH DIFFERENTIAL/PLATELET
BASOS ABS: 0 10*3/uL (ref 0.0–0.1)
Basophils Relative: 1 %
EOS PCT: 8 %
Eosinophils Absolute: 0.7 10*3/uL (ref 0.0–0.7)
HEMATOCRIT: 35.8 % — AB (ref 36.0–46.0)
Hemoglobin: 11.4 g/dL — ABNORMAL LOW (ref 12.0–15.0)
LYMPHS ABS: 1.7 10*3/uL (ref 0.7–4.0)
LYMPHS PCT: 22 %
MCH: 29.5 pg (ref 26.0–34.0)
MCHC: 31.8 g/dL (ref 30.0–36.0)
MCV: 92.7 fL (ref 78.0–100.0)
MONO ABS: 0.7 10*3/uL (ref 0.1–1.0)
MONOS PCT: 9 %
Neutro Abs: 4.7 10*3/uL (ref 1.7–7.7)
Neutrophils Relative %: 60 %
PLATELETS: 158 10*3/uL (ref 150–400)
RBC: 3.86 MIL/uL — ABNORMAL LOW (ref 3.87–5.11)
RDW: 15.2 % (ref 11.5–15.5)
WBC: 7.8 10*3/uL (ref 4.0–10.5)

## 2015-08-10 LAB — COMPREHENSIVE METABOLIC PANEL
ALT: 11 U/L — AB (ref 14–54)
AST: 18 U/L (ref 15–41)
Albumin: 3.6 g/dL (ref 3.5–5.0)
Alkaline Phosphatase: 97 U/L (ref 38–126)
Anion gap: 7 (ref 5–15)
BUN: 47 mg/dL — AB (ref 6–20)
CHLORIDE: 104 mmol/L (ref 101–111)
CO2: 28 mmol/L (ref 22–32)
Calcium: 8.8 mg/dL — ABNORMAL LOW (ref 8.9–10.3)
Creatinine, Ser: 1.24 mg/dL — ABNORMAL HIGH (ref 0.44–1.00)
GFR, EST AFRICAN AMERICAN: 43 mL/min — AB (ref >60)
GFR, EST NON AFRICAN AMERICAN: 37 mL/min — AB (ref >60)
GLUCOSE: 127 mg/dL — AB (ref 65–99)
Potassium: 4.1 mmol/L (ref 3.5–5.1)
Sodium: 139 mmol/L (ref 135–145)
TOTAL PROTEIN: 6.7 g/dL (ref 6.5–8.1)
Total Bilirubin: 0.6 mg/dL (ref 0.3–1.2)

## 2015-08-11 LAB — HEMOGLOBIN A1C
Hgb A1c MFr Bld: 6.6 % — ABNORMAL HIGH (ref 4.8–5.6)
MEAN PLASMA GLUCOSE: 143 mg/dL

## 2015-08-25 ENCOUNTER — Encounter (HOSPITAL_COMMUNITY)
Admission: RE | Admit: 2015-08-25 | Discharge: 2015-08-25 | Disposition: A | Discharge status: Home / Self Care | Payer: Medicare Other | Source: Skilled Nursing Facility | Attending: Internal Medicine | Admitting: Internal Medicine

## 2015-08-25 DIAGNOSIS — I5032 Chronic diastolic (congestive) heart failure: Secondary | ICD-10-CM | POA: Diagnosis not present

## 2015-08-25 LAB — URINALYSIS, ROUTINE W REFLEX MICROSCOPIC
Bilirubin Urine: NEGATIVE
GLUCOSE, UA: NEGATIVE mg/dL
KETONES UR: NEGATIVE mg/dL
Nitrite: POSITIVE — AB
PH: 6 (ref 5.0–8.0)
Protein, ur: NEGATIVE mg/dL
Specific Gravity, Urine: 1.01 (ref 1.005–1.030)

## 2015-08-25 LAB — URINE MICROSCOPIC-ADD ON: Squamous Epithelial / LPF: NONE SEEN

## 2015-08-27 LAB — URINE CULTURE

## 2015-09-02 ENCOUNTER — Encounter (HOSPITAL_COMMUNITY)
Admission: RE | Admit: 2015-09-02 | Discharge: 2015-09-02 | Disposition: A | Discharge status: Home / Self Care | Payer: Medicare Other | Source: Skilled Nursing Facility | Attending: Internal Medicine | Admitting: Internal Medicine

## 2015-09-02 DIAGNOSIS — I5032 Chronic diastolic (congestive) heart failure: Secondary | ICD-10-CM | POA: Diagnosis not present

## 2015-09-02 LAB — URINE MICROSCOPIC-ADD ON

## 2015-09-02 LAB — URINALYSIS, ROUTINE W REFLEX MICROSCOPIC
BILIRUBIN URINE: NEGATIVE
Glucose, UA: NEGATIVE mg/dL
Ketones, ur: NEGATIVE mg/dL
NITRITE: POSITIVE — AB
PH: 5.5 (ref 5.0–8.0)
Protein, ur: NEGATIVE mg/dL
SPECIFIC GRAVITY, URINE: 1.01 (ref 1.005–1.030)

## 2015-09-05 ENCOUNTER — Non-Acute Institutional Stay (SKILLED_NURSING_FACILITY): Payer: Medicare HMO | Admitting: Internal Medicine

## 2015-09-05 ENCOUNTER — Encounter: Payer: Self-pay | Admitting: Internal Medicine

## 2015-09-05 ENCOUNTER — Other Ambulatory Visit (HOSPITAL_COMMUNITY)
Admission: RE | Admit: 2015-09-05 | Discharge: 2015-09-05 | Disposition: A | Discharge status: Home / Self Care | Payer: Medicare HMO | Source: Skilled Nursing Facility | Attending: Internal Medicine | Admitting: Internal Medicine

## 2015-09-05 ENCOUNTER — Ambulatory Visit (HOSPITAL_COMMUNITY)
Admission: RE | Admit: 2015-09-05 | Discharge: 2015-09-05 | Disposition: A | Discharge status: Home / Self Care | Payer: Medicare HMO | Source: Skilled Nursing Facility | Attending: Internal Medicine | Admitting: Internal Medicine

## 2015-09-05 DIAGNOSIS — J189 Pneumonia, unspecified organism: Secondary | ICD-10-CM | POA: Diagnosis not present

## 2015-09-05 DIAGNOSIS — J441 Chronic obstructive pulmonary disease with (acute) exacerbation: Secondary | ICD-10-CM

## 2015-09-05 DIAGNOSIS — R0989 Other specified symptoms and signs involving the circulatory and respiratory systems: Secondary | ICD-10-CM | POA: Insufficient documentation

## 2015-09-05 DIAGNOSIS — R918 Other nonspecific abnormal finding of lung field: Secondary | ICD-10-CM | POA: Insufficient documentation

## 2015-09-05 DIAGNOSIS — I10 Essential (primary) hypertension: Secondary | ICD-10-CM

## 2015-09-05 DIAGNOSIS — Z8701 Personal history of pneumonia (recurrent): Secondary | ICD-10-CM | POA: Insufficient documentation

## 2015-09-05 DIAGNOSIS — I5033 Acute on chronic diastolic (congestive) heart failure: Secondary | ICD-10-CM

## 2015-09-05 DIAGNOSIS — I059 Rheumatic mitral valve disease, unspecified: Secondary | ICD-10-CM | POA: Insufficient documentation

## 2015-09-05 DIAGNOSIS — R05 Cough: Secondary | ICD-10-CM | POA: Insufficient documentation

## 2015-09-05 DIAGNOSIS — I509 Heart failure, unspecified: Secondary | ICD-10-CM | POA: Insufficient documentation

## 2015-09-05 DIAGNOSIS — J45909 Unspecified asthma, uncomplicated: Secondary | ICD-10-CM | POA: Insufficient documentation

## 2015-09-05 DIAGNOSIS — N184 Chronic kidney disease, stage 4 (severe): Secondary | ICD-10-CM

## 2015-09-05 LAB — COMPREHENSIVE METABOLIC PANEL
ALT: 15 U/L (ref 14–54)
AST: 23 U/L (ref 15–41)
Albumin: 3.3 g/dL — ABNORMAL LOW (ref 3.5–5.0)
Alkaline Phosphatase: 81 U/L (ref 38–126)
Anion gap: 12 (ref 5–15)
BILIRUBIN TOTAL: 0.7 mg/dL (ref 0.3–1.2)
BUN: 55 mg/dL — ABNORMAL HIGH (ref 6–20)
CALCIUM: 8.7 mg/dL — AB (ref 8.9–10.3)
CHLORIDE: 98 mmol/L — AB (ref 101–111)
CO2: 25 mmol/L (ref 22–32)
CREATININE: 1.65 mg/dL — AB (ref 0.44–1.00)
GFR, EST AFRICAN AMERICAN: 30 mL/min — AB (ref >60)
GFR, EST NON AFRICAN AMERICAN: 26 mL/min — AB (ref >60)
Glucose, Bld: 194 mg/dL — ABNORMAL HIGH (ref 65–99)
Potassium: 4.2 mmol/L (ref 3.5–5.1)
Sodium: 135 mmol/L (ref 135–145)
TOTAL PROTEIN: 7.2 g/dL (ref 6.5–8.1)

## 2015-09-05 LAB — CBC WITH DIFFERENTIAL/PLATELET
Basophils Absolute: 0 10*3/uL (ref 0.0–0.1)
Basophils Relative: 0 %
EOS PCT: 2 %
Eosinophils Absolute: 0.2 10*3/uL (ref 0.0–0.7)
HCT: 32.2 % — ABNORMAL LOW (ref 36.0–46.0)
Hemoglobin: 10.4 g/dL — ABNORMAL LOW (ref 12.0–15.0)
LYMPHS ABS: 1 10*3/uL (ref 0.7–4.0)
LYMPHS PCT: 11 %
MCH: 29.1 pg (ref 26.0–34.0)
MCHC: 32.3 g/dL (ref 30.0–36.0)
MCV: 90.2 fL (ref 78.0–100.0)
MONO ABS: 1.4 10*3/uL — AB (ref 0.1–1.0)
Monocytes Relative: 14 %
Neutro Abs: 6.9 10*3/uL (ref 1.7–7.7)
Neutrophils Relative %: 73 %
PLATELETS: 185 10*3/uL (ref 150–400)
RBC: 3.57 MIL/uL — AB (ref 3.87–5.11)
RDW: 14.6 % (ref 11.5–15.5)
WBC: 9.5 10*3/uL (ref 4.0–10.5)

## 2015-09-05 LAB — URINE CULTURE

## 2015-09-05 NOTE — Progress Notes (Signed)
Patient ID: [name], female   DOB: 02-25-1922, 82 y.o.   MRN: [id number]                     DATE: 09/05/15          FACILITY: [facility]                             LEVEL OF CARE:   SNF  This is an acute-routine visit   CHIEF COMPLAINT:   Medical management of chronic medical conditions including COPD-diastolic CHFCVA-renal insufficiency-diabetes type 2-GERD-depression-history of agitation-- acute visit secondary to wheezing-just not feeling well                        HISTORY OF PRESENT ILLNESS:  This is a patient who is a longstanding resident in the facility, dating back to April of last year.   She has a complex medical history as noted above-earlier this year she did go to the ER with altered mental status    An MRI of the brain showed an acute infarction in the left inferior body of the caudate.  The family refused any further work-up and even the patient, I think, had said she did not want any work-up.  Her aspirin 81 mg was increased to 325.     It was noted that she was bradycardic with heart rates in the 40s.  Her metoprolol was decreased from 50 twice a day to 25 twice a day.  It is has been downgraded to 12.5 mg twice a day pulse rates appear to be stable.     The patient has been in the building since April 2015.  She was admitted at that point with, I believe, COPD acute.    She also has diastolic heart failure, pulmonary hypertension. She is currently on Demadex 80 mg in the morning and 60 mg later in the day- Her weight currently is 194.6 --this appears stable to actually slightly a couple pounds below her recent baseline -- She also is a type II diabetic on Lantus 5 units a day and CBGs appear to be stable largely in the mid 100s    Earlier this year... Her daughter had requested a medication review in an attempt to minimize medications There was concern of sedation-she also had failure thrive concerns with poor appetite increased  confusion-she appeared to be declining.  Medication adjustments were made her when necessary Haldol was discontinued-she was continued on Depakote secondary to history of behaviors.   Actually her clinical status significantly improved she was more bright alert and interactive. This has now persisted for some time.  She is followed by psychiatric services and thought be doing well she is on Depakote 125 mg twice a day as well as Paxil and again this is followed by psychiatric services  Today however if not a very good day-patient has had some increased wheezing appears fatigued says she just doesn't feel well.  She is on ciprofloxacin for an Escherichia coli UTI.  She is afebrile vital signs are stable O2 saturation is in the mid 90s on room air.--She does have a history of COPD with exasperation she currently is on Mucinex 600 mg twice a day-duo nebs every 4 hours when necessary-and Proventil inhaler  She apparently also has had a couple nosebleeds lately  We did order a chest x-ray which has already come back and shows  a new increased density in the right lower hemithorax likely representing pneumonia-also so CHF with mild pulmonary interstitial edema somewhat worse since previous x-ray.  Shows evidence also of previous granulomatous infection-           PAST MEDICAL HISTORY/PROBLEM LIST:                            Osteoarthritis.     Lower extremity weakness, essentially nonambulatory.    Community-acquired pneumonia.    History of breast cancer, status post left mastectomy.    Hypertension.    Type 2 diabetes.     Chronic COPD.    Carpal tunnel syndrome.    Stress urinary incontinence.    Dysphagia.    Acute-on-chronic diastolic heart failure.    Chronic renal failure with a baseline creatinine of around 1.6.      CURRENT MEDICATIONS:  Discharge medications on return from the hospital:    Enteric-coated aspirin 325 q.d.     Zyloprim 100 q.d. for gout.      Norvasc 10 q.d.      Cranberry q.12.     Guaifenesin 600 b.i.d.           Humalog sliding scale.      Metoprolol 12.5   b.i.d.       Paxil 30 q.d.       Potassium 40 b.i.d.         Zantac 150 q.d.          Demadex 80 mg every morning-60 mg every afternoon.        Proventil 2 puffs q.6 hours p.r.n.         Tylenol 650 q.6 p.r.n.         Tessalon three times daily p.r.n.      .     Lantus 5 U at bedtime.                                                                                                                 SOCIAL HISTORY:                   HOUSING:  The patient, I believe was previously at an assisted living.   ADVANCED DIRECTIVES:  She does not have any advanced directives.   FUNCTIONAL STATUS:  She spends most of her time in a wheelchair now.  I.    REVIEW OF SYSTEMS: limited since patient is a poor historian Obtain from patient and nursing staff.  In general there been no fever or chills noted.--Her weight has been relatively stable--says she just doesn't feel very well  Skin  She does have a history of somewhat chronic generalized itching but this has not been an issue recently.  Head ears eyes nose mouth and throat again she has significant visual impairment which is not new does not complain of sore throat  has had a couple episodes of nose bleeding does not appear to have an issue  with that today                CHEST/RESPIRATORY: Does not really complain of shortness of breath-she does have some wheezing however.     CARDIAC:  No chest pain.    GI:  No abdominal pain. Nausea vomiting diarrhea or constipation noted   GU history of UTIs but not specifically complaining of dysuria is being treated for UTI Treated for Escherichia coli back in September as well NEUROLOGICAL:  No headache or dizziness noted.    MUSCULOSKELETAL:  Some chronic right shoulder pain but this appears to be controlled at this time does not really complaining of that this  afternoon-- Psych  Has had a history of behaviors in the past but this has been quite stable for some time which is quite encouraging--no recent significant behaviors to my knowledge   PHYSICAL EXAMINATION :   VITAL SIGNS:      Temperature 98.1 pulse 60 respirations 20 blood pressure 132/69 O2 saturation 94% on room air weight is 194.6 this appears to be stable to a couple pounds below her baseline GENERAL APPEARANCE: This is an obese elderly female in no distress lying in her recliner however she does appear somewhat fatigued week   Her skin is warm and dry did not note any rashe she has numerous seborrheic keratoses diffuse--.  Eyes appear reactive to light again she has diminished visual acuity which is not new sclera and conjunctiva are clear.  Oropharynx is clear mucous membranes are fairly moist. Nose-- do see some dried blood in her turbinates there is no active bleeding   CHEST/RESPIRATORY: Has some expiratory wheezing shallow air entry do not really appreciate labored breathing however she does speak in full sentences     CARDIOVASCULAR:   CARDIAC:   Regular irregular rate and rhythm without murmur gallop or rub she has chronic I would say1- 2+ lower extremity edema bilaterally this appears stable from last exam she has compression hose on    some degree of venous insufficiency.  No clear signs of congestive heart failure.   GASTROINTESTINAL:   ABDOMEN:  No masses.  There is obesity, but no tenderness. bowel sounds are positive                 MUSCULOSKELETAL:    EXTREMITIES:    Moves all ext  4 with lower extremity weakness largely ambulates in a wheelchair limited range of motion of her right shoulder which is not new    BILATERAL LOWER EXTREMITIES:  She has significant osteoarthritis of both knees generalized weakness.   NEUROLOGICAL:              SENSATION/STRENGTH:   She appears to have right arm weakness, although this may relate to shoulder pain more than anything  else.  I cannot detect any other lateralizing findings.  She has anti-gravity strength in her legs. Neurologic her speech is clear .  Psych she is oriented to self this pleasant does follow simple verbal commands--I really don't see much difference day from her recent baseline     Labs  Labs obtained today 09/04/2014.  WBC 9.5 hemoglobin 10.4 platelets 185.  Sodium 135 potassium 4.2 BUN 55 creatinine 1.65.  Albumin 3.3 otherwise liver function tests are within normal limits.  06/12/2015.  Sodium 136 potassium 4.1 BUN 45 creatinine 1.29.  WBC 8.5 hemoglobin 10.5 platelets 161.    05/19/2015.  Sodium 142 potassium 3.8 BUN 37 creatinine 1.29.  04/28/2015.  Liver function tests  within normal limits except ALT 12.  WBC 10.9 hemoglobin 13.5 platelets 165  03/05/2015.  WBC 12.1 hemoglobin 12.3 platelets 246.  Sodium 138 potassium 4 BUN 48 creatinine 1.3.  12/16/2014.  Liver function tests within normal limits except albumin of 3.4.        ASSESSMENT/PLAN:  History COPD with x-ray today showing an element of pneumonia most likely-she is on ciprofloxacin for UTI will switch this to Levaquin for her respiratory issues-we will continue this for 7 days hopefully this will also have coverage for her UTI sensitive to quinolone.  Also will start a prednisone taper 40 mg for 2 days 30 mg 2 days 20 mg daily to 10 mg for 2 days to help with her wheezing-we will make her duo nebs routine every 4 hours for 72 hours and then when necessary. She does continue on Mucinex as well as her Proventil inhaler  Also monitor her vital signs every shift with pulse ox  History of UTI-positive Escherichia coli-again will switch her antibiotic to Levaquin secondary to coverage for pneumonia-  History of diastolic CHF-again x-ray did show mild pulmonary interstitial edema somewhat worse since previous x-ray-will increase her Demadex to 80 mg twice a day-she is on potassium 40 mEq twice a day  as well-this is complicated with her history of renal insufficiency creatinine today is at the upper end of her baseline at 1.65-this will have to be monitored-challenging situation-at this point will update a metabolic panel later in the week to keep an eye on this -- edema and weight appears to be relatively baseline        Failure to thrive? Weight loss-  She actually has been better recently in this regards her weight has stabilized has been eating better although again she has a poor appetite today I suspect secondary to not feeling well .                                 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  This may account for some weakness of the right arm. Otherwise no lateralizing findings she continues on aspirin   , mitral stenosis.  This does not appear to be unstable.    .    Chronic renal insufficiency stage IV.    Labs today show she is on the upper end of her baseline with a creatinine 1.65 BUN of 55 will have to watch this especially since her Demadex is being increase slightly will update this later in the week .    Sinus bradycardia.    Her metoprolol has been reduced recent pulses appear stable                                               Hypertensionon--on Lopressor as well as Norvasc-as noted above I do not see consistent elevations at this point will monitor--recent blood pressures 132/69-123/67 I do see occasional systolics in the [id number] but this does not appear to be consistent      el  History of dementia with agitation-this appears to have stabilized on Depakote-again Haldol has been discontinued    History of gout she does continue on allopurinol l this is been stable without recent flares.    History diabetes type 2 she is on low-dose Lantus as well as sliding scale.- -CBGs appear to be quite stable in the mid 100s--hemoglobin A1c 6.6 last month                             History of hyperlipidemia --she is now off a statin per family wishes to minimize medications    History of depression she is on Paxil--is doing quite well-- she is followed by psychiatric services.                                           History of GERD she continues on Zantac --this has been stable                                                                                    History of anemia I suspect there is an element of chronic renal insufficiency --HGBof 10.4 today appears to be relatively stable ,              [id number]--  of note greater than 40 minutes spent assessing patient reviewing her chart-discussing her status with nursing staff--and coordinating and formulating a plan of care for numerous diagnoses-of note greater than 50% of time spent coordinating plan of care

## 2015-09-08 ENCOUNTER — Encounter (HOSPITAL_COMMUNITY)
Admission: RE | Admit: 2015-09-08 | Discharge: 2015-09-08 | Disposition: A | Discharge status: Home / Self Care | Payer: Medicare HMO | Source: Skilled Nursing Facility | Attending: Internal Medicine | Admitting: Internal Medicine

## 2015-09-08 DIAGNOSIS — N184 Chronic kidney disease, stage 4 (severe): Secondary | ICD-10-CM | POA: Insufficient documentation

## 2015-09-08 DIAGNOSIS — N390 Urinary tract infection, site not specified: Secondary | ICD-10-CM | POA: Diagnosis present

## 2015-09-08 DIAGNOSIS — I5032 Chronic diastolic (congestive) heart failure: Secondary | ICD-10-CM | POA: Insufficient documentation

## 2015-09-08 LAB — BASIC METABOLIC PANEL
ANION GAP: 12 (ref 5–15)
BUN: 78 mg/dL — ABNORMAL HIGH (ref 6–20)
BUN: 78 mg/dL — AB (ref 4–21)
CO2: 24 mmol/L (ref 22–32)
Calcium: 8.6 mg/dL — ABNORMAL LOW (ref 8.9–10.3)
Chloride: 98 mmol/L — ABNORMAL LOW (ref 101–111)
Creatinine, Ser: 1.57 mg/dL — ABNORMAL HIGH (ref 0.44–1.00)
Creatinine: 1.6 mg/dL — AB (ref 0.5–1.1)
GFR calc Af Amer: 32 mL/min — ABNORMAL LOW (ref >60)
GFR, EST NON AFRICAN AMERICAN: 28 mL/min — AB (ref >60)
GLUCOSE: 192 mg/dL — AB (ref 65–99)
GLUCOSE: 195 mg/dL
POTASSIUM: 4.5 mmol/L (ref 3.5–5.1)
Potassium: 4.5 mmol/L (ref 3.4–5.3)
SODIUM: 134 mmol/L — AB (ref 137–147)
Sodium: 134 mmol/L — ABNORMAL LOW (ref 135–145)

## 2015-09-13 ENCOUNTER — Encounter (HOSPITAL_COMMUNITY)
Admission: AD | Admit: 2015-09-13 | Discharge: 2015-09-13 | Disposition: A | Discharge status: Home / Self Care | Payer: Medicare HMO | Source: Skilled Nursing Facility | Attending: Internal Medicine | Admitting: Internal Medicine

## 2015-09-13 DIAGNOSIS — I5032 Chronic diastolic (congestive) heart failure: Secondary | ICD-10-CM | POA: Diagnosis not present

## 2015-09-13 LAB — BASIC METABOLIC PANEL
ANION GAP: 9 (ref 5–15)
BUN: 65 mg/dL — AB (ref 4–21)
BUN: 65 mg/dL — AB (ref 6–20)
CHLORIDE: 101 mmol/L (ref 101–111)
CO2: 29 mmol/L (ref 22–32)
CREATININE: 1.3 mg/dL — AB (ref 0.5–1.1)
Calcium: 8.2 mg/dL — ABNORMAL LOW (ref 8.9–10.3)
Creatinine, Ser: 1.32 mg/dL — ABNORMAL HIGH (ref 0.44–1.00)
GFR calc Af Amer: 40 mL/min — ABNORMAL LOW (ref >60)
GFR, EST NON AFRICAN AMERICAN: 34 mL/min — AB (ref >60)
GLUCOSE: 134 mg/dL
GLUCOSE: 134 mg/dL — AB (ref 65–99)
POTASSIUM: 4 mmol/L (ref 3.4–5.3)
POTASSIUM: 4 mmol/L (ref 3.5–5.1)
SODIUM: 139 mmol/L (ref 137–147)
Sodium: 139 mmol/L (ref 135–145)

## 2015-11-16 LAB — BASIC METABOLIC PANEL
GLUCOSE: 131 mg/dL
Glucose: 130 mg/dL

## 2015-11-17 ENCOUNTER — Emergency Department (HOSPITAL_COMMUNITY): Payer: Medicare HMO

## 2015-11-17 ENCOUNTER — Inpatient Hospital Stay (HOSPITAL_COMMUNITY)
Admission: EM | Admit: 2015-11-17 | Discharge: 2015-11-21 | DRG: 871 | Disposition: A | Discharge status: Skilled Nursing Facility | Payer: Medicare HMO | Attending: Family Medicine | Admitting: Family Medicine

## 2015-11-17 ENCOUNTER — Inpatient Hospital Stay (HOSPITAL_COMMUNITY)
Admit: 2015-11-17 | Discharge: 2015-11-17 | Disposition: A | Discharge status: Home / Self Care | Payer: Medicare Other | Attending: Internal Medicine | Admitting: Internal Medicine

## 2015-11-17 ENCOUNTER — Other Ambulatory Visit: Payer: Self-pay

## 2015-11-17 ENCOUNTER — Non-Acute Institutional Stay (SKILLED_NURSING_FACILITY): Payer: Medicare HMO | Admitting: Internal Medicine

## 2015-11-17 ENCOUNTER — Other Ambulatory Visit (HOSPITAL_COMMUNITY)
Admission: AD | Admit: 2015-11-17 | Discharge: 2015-11-17 | Disposition: A | Discharge status: Home / Self Care | Payer: Medicare HMO | Source: Skilled Nursing Facility | Attending: Internal Medicine | Admitting: Internal Medicine

## 2015-11-17 ENCOUNTER — Encounter (HOSPITAL_COMMUNITY): Payer: Self-pay

## 2015-11-17 DIAGNOSIS — G92 Toxic encephalopathy: Secondary | ICD-10-CM | POA: Diagnosis present

## 2015-11-17 DIAGNOSIS — A4151 Sepsis due to Escherichia coli [E. coli]: Principal | ICD-10-CM | POA: Diagnosis present

## 2015-11-17 DIAGNOSIS — Z825 Family history of asthma and other chronic lower respiratory diseases: Secondary | ICD-10-CM

## 2015-11-17 DIAGNOSIS — R5383 Other fatigue: Secondary | ICD-10-CM | POA: Diagnosis not present

## 2015-11-17 DIAGNOSIS — J45909 Unspecified asthma, uncomplicated: Secondary | ICD-10-CM | POA: Diagnosis present

## 2015-11-17 DIAGNOSIS — Z880 Allergy status to penicillin: Secondary | ICD-10-CM

## 2015-11-17 DIAGNOSIS — Z7982 Long term (current) use of aspirin: Secondary | ICD-10-CM

## 2015-11-17 DIAGNOSIS — Z87440 Personal history of urinary (tract) infections: Secondary | ICD-10-CM

## 2015-11-17 DIAGNOSIS — K8070 Calculus of gallbladder and bile duct without cholecystitis without obstruction: Secondary | ICD-10-CM | POA: Diagnosis present

## 2015-11-17 DIAGNOSIS — N179 Acute kidney failure, unspecified: Secondary | ICD-10-CM | POA: Diagnosis present

## 2015-11-17 DIAGNOSIS — E7800 Pure hypercholesterolemia, unspecified: Secondary | ICD-10-CM | POA: Diagnosis present

## 2015-11-17 DIAGNOSIS — R509 Fever, unspecified: Secondary | ICD-10-CM

## 2015-11-17 DIAGNOSIS — K219 Gastro-esophageal reflux disease without esophagitis: Secondary | ICD-10-CM | POA: Diagnosis present

## 2015-11-17 DIAGNOSIS — I130 Hypertensive heart and chronic kidney disease with heart failure and stage 1 through stage 4 chronic kidney disease, or unspecified chronic kidney disease: Secondary | ICD-10-CM | POA: Diagnosis present

## 2015-11-17 DIAGNOSIS — Z853 Personal history of malignant neoplasm of breast: Secondary | ICD-10-CM

## 2015-11-17 DIAGNOSIS — E1121 Type 2 diabetes mellitus with diabetic nephropathy: Secondary | ICD-10-CM | POA: Diagnosis present

## 2015-11-17 DIAGNOSIS — R6521 Severe sepsis with septic shock: Secondary | ICD-10-CM

## 2015-11-17 DIAGNOSIS — Z833 Family history of diabetes mellitus: Secondary | ICD-10-CM

## 2015-11-17 DIAGNOSIS — R634 Abnormal weight loss: Secondary | ICD-10-CM | POA: Diagnosis present

## 2015-11-17 DIAGNOSIS — Z6829 Body mass index (BMI) 29.0-29.9, adult: Secondary | ICD-10-CM

## 2015-11-17 DIAGNOSIS — R9431 Abnormal electrocardiogram [ECG] [EKG]: Secondary | ICD-10-CM | POA: Diagnosis present

## 2015-11-17 DIAGNOSIS — R6520 Severe sepsis without septic shock: Secondary | ICD-10-CM | POA: Diagnosis present

## 2015-11-17 DIAGNOSIS — R410 Disorientation, unspecified: Secondary | ICD-10-CM | POA: Diagnosis not present

## 2015-11-17 DIAGNOSIS — E1122 Type 2 diabetes mellitus with diabetic chronic kidney disease: Secondary | ICD-10-CM | POA: Diagnosis present

## 2015-11-17 DIAGNOSIS — R945 Abnormal results of liver function studies: Secondary | ICD-10-CM

## 2015-11-17 DIAGNOSIS — Z794 Long term (current) use of insulin: Secondary | ICD-10-CM

## 2015-11-17 DIAGNOSIS — I10 Essential (primary) hypertension: Secondary | ICD-10-CM | POA: Diagnosis present

## 2015-11-17 DIAGNOSIS — N390 Urinary tract infection, site not specified: Secondary | ICD-10-CM | POA: Diagnosis present

## 2015-11-17 DIAGNOSIS — K8020 Calculus of gallbladder without cholecystitis without obstruction: Secondary | ICD-10-CM

## 2015-11-17 DIAGNOSIS — K8050 Calculus of bile duct without cholangitis or cholecystitis without obstruction: Secondary | ICD-10-CM

## 2015-11-17 DIAGNOSIS — Z66 Do not resuscitate: Secondary | ICD-10-CM | POA: Diagnosis present

## 2015-11-17 DIAGNOSIS — N2889 Other specified disorders of kidney and ureter: Secondary | ICD-10-CM | POA: Diagnosis present

## 2015-11-17 DIAGNOSIS — F0391 Unspecified dementia with behavioral disturbance: Secondary | ICD-10-CM | POA: Diagnosis present

## 2015-11-17 DIAGNOSIS — Z96642 Presence of left artificial hip joint: Secondary | ICD-10-CM | POA: Diagnosis present

## 2015-11-17 DIAGNOSIS — C641 Malignant neoplasm of right kidney, except renal pelvis: Secondary | ICD-10-CM | POA: Diagnosis present

## 2015-11-17 DIAGNOSIS — E43 Unspecified severe protein-calorie malnutrition: Secondary | ICD-10-CM | POA: Diagnosis present

## 2015-11-17 DIAGNOSIS — J449 Chronic obstructive pulmonary disease, unspecified: Secondary | ICD-10-CM | POA: Diagnosis present

## 2015-11-17 DIAGNOSIS — R32 Unspecified urinary incontinence: Secondary | ICD-10-CM | POA: Diagnosis present

## 2015-11-17 DIAGNOSIS — G9340 Encephalopathy, unspecified: Secondary | ICD-10-CM | POA: Diagnosis present

## 2015-11-17 DIAGNOSIS — N184 Chronic kidney disease, stage 4 (severe): Secondary | ICD-10-CM | POA: Diagnosis present

## 2015-11-17 DIAGNOSIS — I272 Other secondary pulmonary hypertension: Secondary | ICD-10-CM | POA: Diagnosis present

## 2015-11-17 DIAGNOSIS — R7989 Other specified abnormal findings of blood chemistry: Secondary | ICD-10-CM

## 2015-11-17 DIAGNOSIS — I5032 Chronic diastolic (congestive) heart failure: Secondary | ICD-10-CM | POA: Diagnosis present

## 2015-11-17 DIAGNOSIS — Z8673 Personal history of transient ischemic attack (TIA), and cerebral infarction without residual deficits: Secondary | ICD-10-CM

## 2015-11-17 DIAGNOSIS — A419 Sepsis, unspecified organism: Secondary | ICD-10-CM

## 2015-11-17 DIAGNOSIS — E876 Hypokalemia: Secondary | ICD-10-CM | POA: Diagnosis present

## 2015-11-17 HISTORY — DX: Calculus of bile duct without cholangitis or cholecystitis without obstruction: K80.50

## 2015-11-17 HISTORY — DX: Heart failure, unspecified: I50.9

## 2015-11-17 LAB — URINALYSIS, ROUTINE W REFLEX MICROSCOPIC
GLUCOSE, UA: NEGATIVE mg/dL
KETONES UR: NEGATIVE mg/dL
NITRITE: NEGATIVE
PH: 5.5 (ref 5.0–8.0)
Protein, ur: 30 mg/dL — AB
SPECIFIC GRAVITY, URINE: 1.01 (ref 1.005–1.030)

## 2015-11-17 LAB — COMPREHENSIVE METABOLIC PANEL
ALBUMIN: 3.5 g/dL (ref 3.5–5.0)
ALBUMIN: 3.3 g/dL — AB (ref 3.5–5.0)
ALT: 140 U/L — AB (ref 14–54)
ALT: 136 U/L — ABNORMAL HIGH (ref 14–54)
ANION GAP: 12 (ref 5–15)
AST: 216 U/L — AB (ref 15–41)
AST: 194 U/L — AB (ref 15–41)
Alkaline Phosphatase: 135 U/L — ABNORMAL HIGH (ref 38–126)
Alkaline Phosphatase: 139 U/L — ABNORMAL HIGH (ref 38–126)
Anion gap: 13 (ref 5–15)
BUN: 35 mg/dL — ABNORMAL HIGH (ref 6–20)
BUN: 39 mg/dL — AB (ref 6–20)
CHLORIDE: 103 mmol/L (ref 101–111)
CHLORIDE: 104 mmol/L (ref 101–111)
CO2: 24 mmol/L (ref 22–32)
CO2: 24 mmol/L (ref 22–32)
Calcium: 8.7 mg/dL — ABNORMAL LOW (ref 8.9–10.3)
Calcium: 9 mg/dL (ref 8.9–10.3)
Creatinine, Ser: 1.48 mg/dL — ABNORMAL HIGH (ref 0.44–1.00)
Creatinine, Ser: 1.87 mg/dL — ABNORMAL HIGH (ref 0.44–1.00)
GFR calc Af Amer: 26 mL/min — ABNORMAL LOW (ref >60)
GFR calc non Af Amer: 30 mL/min — ABNORMAL LOW (ref >60)
GFR, EST AFRICAN AMERICAN: 34 mL/min — AB (ref >60)
GFR, EST NON AFRICAN AMERICAN: 22 mL/min — AB (ref >60)
Glucose, Bld: 178 mg/dL — ABNORMAL HIGH (ref 65–99)
Glucose, Bld: 184 mg/dL — ABNORMAL HIGH (ref 65–99)
POTASSIUM: 3.9 mmol/L (ref 3.5–5.1)
Potassium: 3.4 mmol/L — ABNORMAL LOW (ref 3.5–5.1)
SODIUM: 139 mmol/L (ref 135–145)
Sodium: 141 mmol/L (ref 135–145)
Total Bilirubin: 2.7 mg/dL — ABNORMAL HIGH (ref 0.3–1.2)
Total Bilirubin: 2.6 mg/dL — ABNORMAL HIGH (ref 0.3–1.2)
Total Protein: 7.6 g/dL (ref 6.5–8.1)
Total Protein: 7.2 g/dL (ref 6.5–8.1)

## 2015-11-17 LAB — CBC WITH DIFFERENTIAL/PLATELET
BASOS ABS: 0 10*3/uL (ref 0.0–0.1)
Basophils Absolute: 0 10*3/uL (ref 0.0–0.1)
Basophils Relative: 0 %
Basophils Relative: 0 %
EOS ABS: 0 10*3/uL (ref 0.0–0.7)
EOS PCT: 0 %
EOS PCT: 0 %
Eosinophils Absolute: 0 10*3/uL (ref 0.0–0.7)
HCT: 39.3 % (ref 36.0–46.0)
HEMATOCRIT: 38.1 % (ref 36.0–46.0)
HEMOGLOBIN: 12.3 g/dL (ref 12.0–15.0)
Hemoglobin: 12.7 g/dL (ref 12.0–15.0)
LYMPHS ABS: 1.1 10*3/uL (ref 0.7–4.0)
LYMPHS PCT: 4 %
Lymphocytes Relative: 3 %
Lymphs Abs: 0.8 10*3/uL (ref 0.7–4.0)
MCH: 28.7 pg (ref 26.0–34.0)
MCH: 28.4 pg (ref 26.0–34.0)
MCHC: 32.3 g/dL (ref 30.0–36.0)
MCHC: 32.3 g/dL (ref 30.0–36.0)
MCV: 88.7 fL (ref 78.0–100.0)
MCV: 88 fL (ref 78.0–100.0)
Monocytes Absolute: 2.1 10*3/uL — ABNORMAL HIGH (ref 0.1–1.0)
Monocytes Absolute: 2.2 10*3/uL — ABNORMAL HIGH (ref 0.1–1.0)
Monocytes Relative: 6 %
Monocytes Relative: 8 %
NEUTROS ABS: 25.4 10*3/uL — AB (ref 1.7–7.7)
Neutro Abs: 29.8 10*3/uL — ABNORMAL HIGH (ref 1.7–7.7)
Neutrophils Relative %: 91 %
Neutrophils Relative %: 88 %
PLATELETS: 202 10*3/uL (ref 150–400)
PLATELETS: 185 10*3/uL (ref 150–400)
RBC: 4.43 MIL/uL (ref 3.87–5.11)
RBC: 4.33 MIL/uL (ref 3.87–5.11)
RDW: 15 % (ref 11.5–15.5)
RDW: 15 % (ref 11.5–15.5)
WBC: 32.7 10*3/uL — AB (ref 4.0–10.5)
WBC: 28.7 10*3/uL — AB (ref 4.0–10.5)

## 2015-11-17 LAB — URINE MICROSCOPIC-ADD ON

## 2015-11-17 LAB — I-STAT CG4 LACTIC ACID, ED: LACTIC ACID, VENOUS: 3.17 mmol/L — AB (ref 0.5–2.0)

## 2015-11-17 LAB — BASIC METABOLIC PANEL
Glucose: 142 mg/dL
Glucose: 209 mg/dL

## 2015-11-17 LAB — VALPROIC ACID LEVEL: Valproic Acid Lvl: <10 ug/mL — ABNORMAL LOW (ref 50.0–100.0)

## 2015-11-17 MED ORDER — CIPROFLOXACIN IN D5W 400 MG/200ML IV SOLN
400.0000 mg | Freq: Once | INTRAVENOUS | Status: DC
  Filled 2015-11-17: qty 200

## 2015-11-17 MED ORDER — SODIUM CHLORIDE 0.9 % IV BOLUS (SEPSIS)
2000.0000 mL | Freq: Once | INTRAVENOUS | Status: AC
  Administered 2015-11-18: 2000 mL via INTRAVENOUS

## 2015-11-17 MED ORDER — VANCOMYCIN HCL IN DEXTROSE 1-5 GM/200ML-% IV SOLN
1000.0000 mg | Freq: Once | INTRAVENOUS | Status: AC
  Administered 2015-11-18: 1000 mg via INTRAVENOUS
  Filled 2015-11-17: qty 200

## 2015-11-17 MED ORDER — METRONIDAZOLE IN NACL 5-0.79 MG/ML-% IV SOLN
500.0000 mg | Freq: Once | INTRAVENOUS | Status: AC
  Administered 2015-11-18: 500 mg via INTRAVENOUS

## 2015-11-17 MED ORDER — METRONIDAZOLE IN NACL 5-0.79 MG/ML-% IV SOLN
500.0000 mg | Freq: Once | INTRAVENOUS | Status: DC
  Filled 2015-11-17: qty 100

## 2015-11-17 NOTE — ED Notes (Signed)
Pt sent over from [facility] for abnormal labs and sob with history of chf.  Per staff at [facility], pt has decreased loc and increased weakness

## 2015-11-17 NOTE — Progress Notes (Signed)
Patient ID: [name], female   DOB: 1921/11/30, 82 y.o.   MRN: [id number]                          FACILITY: [facility]                             LEVEL OF CARE:   SNF  This is an acute visit   CHIEF COMPLAINT:    Acute visit secondary fever of unknown origin-lethargy                      HISTORY OF PRESENT ILLNESS:  This is a patient who is a longstanding resident in the facility, dating back to April of last year.   She has a complex medical history including COPD-CVA-diastolic CHF-and failure to thrive.  She has had periods where she appears to weak and not eating as well appears more weakand then get stronger eats better is more alert.  Apparently she's had a gradual decline her over the past several weeks however talking with her daughter and nursing staff.  Today however apparently nursing staff has noticed some increased lethargy-she also has been noted to have a temperature of 101-slightly tachycardic she feels warm to touch.  CBG is 203 she does have a history of diabetes type 2 is on low-dose long-acting insulin-O2 saturation is in the low 90s.  When you call patient's name she will attempt to make eye contact and moves her head but does not really follow verbal instructions well         PAST MEDICAL HISTORY/PROBLEM LIST:                            Osteoarthritis.     Lower extremity weakness, essentially nonambulatory.    Community-acquired pneumonia.    History of breast cancer, status post left mastectomy.    Hypertension.    Type 2 diabetes.     Chronic COPD.    Carpal tunnel syndrome.    Stress urinary incontinence.    Dysphagia.    Acute-on-chronic diastolic heart failure.    Chronic renal failure with a baseline creatinine of around 1.6.      CURRENT MEDICATIONS:  Discharge medications on return from the hospital:    Enteric-coated aspirin 325 q.d.     Zyloprim 100 q.d. for gout.    Norvasc 10 q.d.       Cranberry q.12.     Guaifenesin 600 b.i.d.           Humalog sliding scale.      Metoprolol 12.5   b.i.d.       Paxil 30 q.d.       Potassium 40 b.i.d.         Zantac 150 q.d.          Demadex 80 mg every morning-60 mg every afternoon.        Proventil 2 puffs q.6 hours p.r.n.         Tylenol 650 q.6 p.r.n.         Tessalon three times daily p.r.n.      .     Lantus 5 U at bedtime.  SOCIAL HISTORY:                   HOUSING:  The patient, I believe was previously at an assisted living.   ADVANCED DIRECTIVES:  She does not have any advanced directives.   FUNCTIONAL STATUS:  She spends most of her time in a wheelchair -- speaking with nursing and daughter apparently there's been a gradual decline here  I.    REVIEW OF SYSTEMS: li Please see history of present illness again essentially unattainable secondary patient's really not speaking    PHYSICAL EXAMINATION :    Temperatures 101.1 pulse 102 respirations 20 blood pressure 129/58 CBGs 203 weight is 179 GENERAL APPEARANCE: This is an obese elderly female She is sitting in her recliner appears lethargic does respond when her name is called opens her eyes but is very weak appearing Her skin is warm and dry with some increased warmth to touch--.  Eyes appear reactive to light again she has diminished visual acuity which is not new sclera and conjunctiva are clear.  Oropharynx difficult exam since patient did not really open her mouth very wide I did not note any acute abnormaliti   CHEST/RESPIRATORY:  shallow air entry do not really appreciate labored breathing     CARDIOVASCULAR:   CARDIAC:   Regular irregular rate and rhythm without murmur gallop or rub she has chronic I would say1- 2+ lower extremity edema bilaterally this appears stable from last exam she has compression hose on    some degree of venous  insufficiency.  No clear signs of congestive heart failure.   GASTROINTESTINAL:   ABDOMEN:  No masses.  There is obesity, but no tenderness. bowel sounds are positive does not appear to have overt tenderness here                 MUSCULOSKELETAL:    EXTREMITIES:     Difficult exam since patient is not really following verbal commands but appears able to move all her extremities with significant lower extremity weakness which is not new again she generally is weak-appearing here NEUROLOGICAL:            I did not appreciate lateralizing findings although again she is generally quite weak and sluggish .  Psych  Is not really speaking does acknowledgeone1 when her name is called but does not appear really strong enough to speak     Labs  09/13/2015.  Sodium 139 potassium 4 BUN 65 creatinine 1.32.     09/04/2014.  WBC 9.5 hemoglobin 10.4 platelets 185.  Sodium 135 potassium 4.2 BUN 55 creatinine 1.65.  Albumin 3.3 otherwise liver function tests are within normal limits.  06/12/2015.  Sodium 136 potassium 4.1 BUN 45 creatinine 1.29.  WBC 8.5 hemoglobin 10.5 platelets 161.    05/19/2015.  Sodium 142 potassium 3.8 BUN 37 creatinine 1.29.  04/28/2015.  Liver function tests within normal limits except ALT 12.  WBC 10.9 hemoglobin 13.5 platelets 165  03/05/2015.  WBC 12.1 hemoglobin 12.3 platelets 246.  Sodium 138 potassium 4 BUN 48 creatinine 1.3.  12/16/2014.  Liver function tests within normal limits except albumin of 3.4.        ASSESSMENT/PLAN:  Lethargy-fever of unknown origin-I suspect patient has an infectious process going on here-she does have a history of UTIs as well as respiratory issues-I did discuss patient's status with her responsible party her daughter via phone-her daughter wishes her to be kept in the facility for now-.  Will order stat  blood work including a CBC with differential and comprehensive metabolic panel panel notify provider  of results-we will also order a UA C&S as well as a 2 view chest x-ray.  We are empirically start her on antibiotic Levaquin 500 mg daily for 7 days-she will have to be monitored closely with vital signs pulse ox every 4 hours for now.  Also will hold her Lantus since her appetite is quite poor and monitor her CBGs before meals and at bedtime call provider for CBG less than 60 or greater than 250.  Again patient will have to be monitored very closely I did speak extensively with her daughter via phone who has wished essentially to keep her in the facility for now if at all possible.  Again will await diagnostic studies and labs-.  [id number] note greater than 40 minutes spent assessing patient-and coordinating and formulating a plan of care --of note greater than 50% of time spent coordinating plan of care with family input

## 2015-11-17 NOTE — ED Provider Notes (Signed)
CSN: [id number]     Arrival date & time 11/17/15  2246 History  By signing my name below, I, Helane Gunther, attest that this documentation has been prepared under the direction and in the presence of Varney Biles, MD. Electronically Signed: Helane Gunther, ED Scribe. 11/17/2015. 11:18 PM.      Chief Complaint  Patient presents with  . Fever   The history is provided by the patient, the nursing home and a relative. No language interpreter was used.   HPI Comments: Level 5 Caveat (Senior Dementia) [name] is a 82 y.o. female with a PMHx of breast cancer, CHF, chronic diastolic heart failure, mitral stenosis, pulmonary HTN, hypercholesteremia, CKD, asthma, and DM who presents to the Emergency Department complaining of fever onset today. Per staff at the [facility], pt's white count was over 30,000 and pt is more confused than at baseline. Per daughter, pt has had very strong allergic reactions to "somethign related to penicillin" that was prescribed by pt's dentist 3 years ago, but pt has been given Cipro in the past. She notes pt is able to feed herself and sometimes has "a little bit of aspiration." She notes pt has a PMHx of UTI and has been confused with these in the past. Pt is allergic to cephalosporins, penicillins, amoxicillin, carbapenems, codeine, and aleve.   Pt has a DNR.  Past Medical History  Diagnosis Date  . Breast cancer ([place])   . Essential hypertension, benign   . Depression   . GERD (gastroesophageal reflux disease)   . Hypercholesteremia   . CKD (chronic kidney disease) stage 3, GFR 30-59 ml/min   . Pneumonia   . UTI (lower urinary tract infection)   . Arthritis   . Asthma   . Venous stasis   . Type 2 diabetes mellitus ([place])   . Chronic diastolic heart failure (HCC)     LVEF 70%  . Mitral stenosis     Moderate  . Secondary pulmonary hypertension (HCC)     PASP 64 mmHg  . CHF (congestive heart failure) [facility])    Past Surgical History  Procedure  Laterality Date  . Replacement total knee    . Mastectomy    . Total hip arthroplasty    . Carotid endarterectomy    . Cataracts     Family History  Problem Relation Age of Onset  . Asthma Other   . Diabetes Other    Social History  Substance Use Topics  . Smoking status: Never Smoker   . Smokeless tobacco: Never Used  . Alcohol Use: No   OB History    No data available     Review of Systems  Unable to perform ROS: Dementia    Allergies  Aleve; Carbapenems; Cephalosporins; Codeine; Amoxicillin; and Penicillins  Home Medications   Prior to Admission medications   Medication Sig Start Date End Date Taking? Authorizing Provider  acetaminophen (TYLENOL) 325 MG tablet Take 650 mg by mouth every 6 (six) hours as needed for mild pain.     Historical Provider, MD  albuterol (PROVENTIL HFA;VENTOLIN HFA) 108 (90 BASE) MCG/ACT inhaler Inhale 2 puffs into the lungs every 6 (six) hours as needed for wheezing or shortness of breath.    Historical Provider, MD  allopurinol (ZYLOPRIM) 100 MG tablet TAKE (2) TABLETS BY MOUTH ONCE DAILY. 08/07/13   Mikey Kirschner, MD  alum & mag hydroxide-simeth [facility]) 200-200-20 MG/5ML suspension Take by mouth every 4 (four) hours as needed for indigestion or heartburn.  Historical Provider, MD  amLODipine (NORVASC) 10 MG tablet Take 10 mg by mouth daily.    Historical Provider, MD  aspirin 325 MG tablet Take 1 tablet (325 mg total) by mouth daily. 11/15/14   Kathie Dike, MD  benzonatate (TESSALON) 100 MG capsule Take 1 capsule (100 mg total) by mouth 3 (three) times daily as needed for cough. 04/19/14   Kathie Dike, MD  Cranberry 475 MG CAPS Take 1 capsule by mouth every 12 (twelve) hours.    Historical Provider, MD  Cranberry-Vitamin C-Inulin (UTI-STAT) LIQD Take 30 mLs by mouth daily.    Historical Provider, MD  divalproex (DEPAKOTE) 125 MG DR tablet Take 125 mg by mouth 2 (two) times daily.    Historical Provider, MD  fenofibrate 54 MG tablet  Take 1 tablet (54 mg total) by mouth daily. Patient not taking: Reported on 11/14/2014 12/14/13   Radene Gunning, NP  guaiFENesin (MUCINEX) 600 MG 12 hr tablet Take 1 tablet (600 mg total) by mouth 2 (two) times daily. 04/19/14   Kathie Dike, MD  guaifenesin (TUSSIN) 100 MG/5ML syrup Take 200 mg by mouth 2 (two) times daily as needed for cough.     Historical Provider, MD  insulin glargine (LANTUS) 100 UNIT/ML injection Inject 5 Units into the skin at bedtime.    Historical Provider, MD  insulin lispro (HUMALOG) 100 UNIT/ML injection Inject 2-6 Units into the skin 3 (three) times daily before meals. Sliding scale as follows: 200-250=2units 251-300=4units 301-350=6units If CBG greater than 350 give 6 units and call Provider    Historical Provider, MD  ipratropium-albuterol (DUONEB) 0.5-2.5 (3) MG/3ML SOLN Take 3 mLs by nebulization every 4 (four) hours as needed (shortness of breath).    Historical Provider, MD  magnesium hydroxide (MILK OF MAGNESIA) 400 MG/5ML suspension Take 30 mLs by mouth daily as needed for mild constipation.    Historical Provider, MD  metoprolol tartrate (LOPRESSOR) 25 MG tablet Take 25 mg by mouth 2 (two) times daily. Take one-half tab equal 12.5 mg bid    Historical Provider, MD  oxybutynin (DITROPAN-XL) 5 MG 24 hr tablet Take 1 tablet (5 mg total) by mouth at bedtime. Patient not taking: Reported on 11/14/2014 09/06/13   Mikey Kirschner, MD  PARoxetine (PAXIL) 30 MG tablet Take 30 mg by mouth daily.    Historical Provider, MD  Polyethyl Glycol-Propyl Glycol (SYSTANE OP) Place 1 drop into both eyes 2 (two) times daily.    Historical Provider, MD  potassium chloride SA (K-DUR,KLOR-CON) 20 MEQ tablet Take 40 mEq by mouth 2 (two) times daily.     Historical Provider, MD  ranitidine (ZANTAC) 150 MG tablet Take 150 mg by mouth at bedtime.    Historical Provider, MD  simvastatin (ZOCOR) 10 MG tablet Take 10 mg by mouth at bedtime.    Historical Provider, MD  Spacer/Aero-Holding  Chambers (AEROCHAMBER MV) inhaler Use as instructed. 2 puffs BID Patient not taking: Reported on 11/14/2014 09/28/13   Mikey Kirschner, MD  torsemide (DEMADEX) 20 MG tablet Take 80 mg by mouth 2 (two) times daily. Takes  80 mg every morning 60 mg every afternoon    Historical Provider, MD   BP 116/66 mmHg  Pulse 87  Temp(Src) 98.8 F (37.1 C) (Rectal)  Resp 19  Ht 5\' 5"  (1.651 m)  Wt 200 lb (90.719 kg)  BMI 33.28 kg/m2  SpO2 94% Physical Exam  Constitutional: She appears well-developed and well-nourished.  HENT:  Head: Normocephalic and atraumatic.  Eyes: Conjunctivae  are normal. Right eye exhibits no discharge. Left eye exhibits no discharge.  Cardiovascular: Normal rate, regular rhythm, normal heart sounds and intact distal pulses.   Pulmonary/Chest: Effort normal and breath sounds normal. No respiratory distress.  Anterior lungs are CTA  Abdominal: Soft. There is tenderness (LLQ).  Neurological: Coordination normal.  Following simple commands, oriented to self only  Skin: Skin is warm and dry. No rash noted. She is not diaphoretic. No erythema.  No pressure ulcers appreciated  Psychiatric: She has a normal mood and affect.  Nursing note and vitals reviewed.   ED Course  .Critical Care Performed by: Varney Biles Authorized by: Varney Biles Total critical care time: 48 minutes Critical care time was exclusive of separately billable procedures and treating other patients. Critical care was necessary to treat or prevent imminent or life-threatening deterioration of the following conditions: sepsis. Critical care was time spent personally by me on the following activities: development of treatment plan with patient or surrogate, discussions with consultants, evaluation of patient's response to treatment, examination of patient, obtaining history from patient or surrogate, ordering and performing treatments and interventions, ordering and review of laboratory studies, ordering  and review of radiographic studies, re-evaluation of patient's condition and review of old charts.    DIAGNOSTIC STUDIES: Oxygen Saturation is 93% on RA, low by my interpretation.    COORDINATION OF CARE: 11:18 PM - Discussed plans to wait on diagnostic studies and imaging, as well as to order vancomycin. Will consult with pharmacist about pt's allergies. Pt advised of plan for treatment and pt agrees.  Labs Review Labs Reviewed  COMPREHENSIVE METABOLIC PANEL - Abnormal; Notable for the following:    Glucose, Bld 184 (*)    BUN 39 (*)    Creatinine, Ser 1.87 (*)    Albumin 3.3 (*)    AST 194 (*)    ALT 136 (*)    Alkaline Phosphatase 139 (*)    Total Bilirubin 2.6 (*)    GFR calc non Af Amer 22 (*)    GFR calc Af Amer 26 (*)    All other components within normal limits  CBC WITH DIFFERENTIAL/PLATELET - Abnormal; Notable for the following:    WBC 28.7 (*)    Neutro Abs 25.4 (*)    Monocytes Absolute 2.2 (*)    All other components within normal limits  URINALYSIS, ROUTINE W REFLEX MICROSCOPIC (NOT AT [facility] - Abnormal; Notable for the following:    APPearance CLOUDY (*)    Hgb urine dipstick SMALL (*)    Bilirubin Urine MODERATE (*)    Ketones, ur TRACE (*)    Protein, ur 100 (*)    Leukocytes, UA MODERATE (*)    All other components within normal limits  TROPONIN I - Abnormal; Notable for the following:    Troponin I 0.06 (*)    All other components within normal limits  URINE MICROSCOPIC-ADD ON - Abnormal; Notable for the following:    Squamous Epithelial / LPF 0-5 (*)    Bacteria, UA MANY (*)    All other components within normal limits  I-STAT CG4 LACTIC ACID, ED - Abnormal; Notable for the following:    Lactic Acid, Venous 3.17 (*)    All other components within normal limits  CULTURE, BLOOD (ROUTINE X 2)  CULTURE, BLOOD (ROUTINE X 2)  URINE CULTURE  I-STAT CG4 LACTIC ACID, ED    Imaging Review Ct Abdomen Pelvis Wo Contrast  11/18/2015  CLINICAL DATA:  Acute  onset of fever  and leukocytosis. Confusion. Left lower quadrant abdominal pain. Initial encounter. EXAM: CT ABDOMEN AND PELVIS WITHOUT CONTRAST TECHNIQUE: Multidetector CT imaging of the abdomen and pelvis was performed following the standard protocol without IV contrast. COMPARISON:  None. FINDINGS: Mild bibasilar atelectasis or scarring is noted. Calcification is noted at the mitral valve. The liver and spleen are unremarkable in appearance. Stones are noted dependently within the gallbladder. The gallbladder is otherwise unremarkable. The pancreas and adrenal glands are unremarkable. There appears be a somewhat complex 4.1 cm mass arising at the lower pole of the right kidney. This is concerning for renal cell carcinoma. A 3.0 cm cyst is noted near the upper pole of the right kidney. Nonspecific perinephric stranding is noted bilaterally. There is no evidence of hydronephrosis. No renal or ureteral stones are identified. No free fluid is identified. The small bowel is unremarkable in appearance. The stomach is within normal limits. No acute vascular abnormalities are seen. Scattered calcification is noted along the abdominal aorta and its branches. The appendix is not well characterized; there is no evidence of appendicitis. Mild diverticulosis is noted along the mid sigmoid colon. The colon is otherwise unremarkable. The bladder is mildly distended and grossly unremarkable. A mildly calcified uterine fibroid is seen. The uterus is otherwise unremarkable. No suspicious adnexal masses are seen. No inguinal lymphadenopathy is seen. No acute osseous abnormalities are identified. The patient's left hip arthroplasty is incompletely imaged but appears grossly unremarkable. Multilevel vacuum phenomenon is noted along the lumbar spine. IMPRESSION: 1. Apparent complex 4.1 cm mass at the lower pole of the right kidney. This is concerning for renal cell carcinoma. Renal ultrasound would be helpful for further evaluation, as  deemed clinically appropriate. 2. Cholelithiasis.  Gallbladder otherwise unremarkable. 3. Mild bibasilar atelectasis or scarring noted. 4. Calcification noted at the mitral valve. 5. Right renal cyst noted. 6. Scattered calcification along the abdominal aorta and its branches. 7. Mild diverticulosis along the mid sigmoid colon. 8. Mildly calcified uterine fibroid noted. 9. Mild degenerative change along the lumbar spine. Electronically Signed   By: Garald Balding M.D.   On: 11/18/2015 02:47   Dg Chest 2 View  11/17/2015  CLINICAL DATA:  Fever.  Altered mental status. EXAM: CHEST  2 VIEW COMPARISON:  09/05/2015. FINDINGS: Stable enlarged cardiac silhouette. Interval small amount of linear atelectasis in the left lower lung zone. Mild diffuse peribronchial thickening without significant change. Stable calcified mediastinal and right hilar lymph nodes. Thoracic spine degenerative changes. Left axillary surgical clips. IMPRESSION: 1. No acute abnormality. 2. Stable cardiomegaly and chronic bronchitic changes. Electronically Signed   By: Claudie Revering M.D.   On: 11/17/2015 20:08   Dg Chest Port 1 View  11/17/2015  CLINICAL DATA:  Acute onset of fever and shortness of breath. Code sepsis. Initial encounter. EXAM: PORTABLE CHEST 1 VIEW COMPARISON:  Chest radiograph performed earlier today at 7:48 p.m. FINDINGS: The lungs are mildly hypoexpanded. Mild peribronchial thickening is noted, with minimal bilateral atelectasis. There is no evidence of pleural effusion or pneumothorax. The cardiomediastinal silhouette is within normal limits. No acute osseous abnormalities are seen. Mild degenerative change is noted at the glenohumeral joints bilaterally. Clips are seen overlying the left axilla. IMPRESSION: Lungs mildly hypoexpanded. Mild chronic peribronchial thickening, with minimal bilateral atelectasis. Electronically Signed   By: Garald Balding M.D.   On: 11/17/2015 23:55   I have personally reviewed and evaluated  these images and lab results as part of my medical decision-making.   EKG Interpretation  Date/Time:  Friday November 17 2015 22:58:32 EDT Ventricular Rate:  82 PR Interval:  188 QRS Duration: 97 QT Interval:  476 QTC Calculation: 556 R Axis:   70 Text Interpretation:  Sinus rhythm Atrial premature complex Prolonged QT  interval No acute changes Confirmed by Kathrynn Humble, MD, Thelma Comp [phone]) on  11/18/2015 1:04:57 AM      MDM   Final diagnoses:  Severe sepsis ([place])  UTI (lower urinary tract infection)  Renal mass, left  Delirium   I personally performed the services described in this documentation, which was scribed in my presence. The recorded information has been reviewed and is accurate.  Pt comes in with cc of fevers, confusion and elevated WC.  DDx: Sepsis syndrome ACS syndrome DKA ICH Stroke Infection - pneumonia/UTI/Cellulitis PE Dehydration Electrolyte abnormality Tox syndrome Cancer of unknown origin  Pt has significantly elevated WC, and fevers at the SNF - with those SIRS she has a lactate of > 3, bilirubin elevation. Will initiate code sepsis - the source likely urine, but of course with AMS and fever meningitis also possible. Abd exam reveals mild L sided tenderness, will get Ct to ensure there is no intra-abd abscess or micro-perforation.   @3 :30 am Repeat exam reveals patient being more alert. Daughter at bedside. Made aware of malignancy possibility. Daughter endorses weight loss. Will admit. Lactate has cleared.    Varney Biles, MD 11/18/15 [phone]

## 2015-11-18 ENCOUNTER — Inpatient Hospital Stay (HOSPITAL_COMMUNITY): Payer: Medicare HMO

## 2015-11-18 ENCOUNTER — Encounter (HOSPITAL_COMMUNITY): Payer: Self-pay | Admitting: *Deleted

## 2015-11-18 DIAGNOSIS — N390 Urinary tract infection, site not specified: Secondary | ICD-10-CM

## 2015-11-18 DIAGNOSIS — R7989 Other specified abnormal findings of blood chemistry: Secondary | ICD-10-CM

## 2015-11-18 DIAGNOSIS — Z7982 Long term (current) use of aspirin: Secondary | ICD-10-CM | POA: Diagnosis not present

## 2015-11-18 DIAGNOSIS — Z6829 Body mass index (BMI) 29.0-29.9, adult: Secondary | ICD-10-CM | POA: Diagnosis not present

## 2015-11-18 DIAGNOSIS — N179 Acute kidney failure, unspecified: Secondary | ICD-10-CM | POA: Diagnosis not present

## 2015-11-18 DIAGNOSIS — N2889 Other specified disorders of kidney and ureter: Secondary | ICD-10-CM | POA: Diagnosis present

## 2015-11-18 DIAGNOSIS — G9340 Encephalopathy, unspecified: Secondary | ICD-10-CM

## 2015-11-18 DIAGNOSIS — N184 Chronic kidney disease, stage 4 (severe): Secondary | ICD-10-CM

## 2015-11-18 DIAGNOSIS — I130 Hypertensive heart and chronic kidney disease with heart failure and stage 1 through stage 4 chronic kidney disease, or unspecified chronic kidney disease: Secondary | ICD-10-CM | POA: Diagnosis present

## 2015-11-18 DIAGNOSIS — R6520 Severe sepsis without septic shock: Secondary | ICD-10-CM

## 2015-11-18 DIAGNOSIS — J45909 Unspecified asthma, uncomplicated: Secondary | ICD-10-CM | POA: Diagnosis present

## 2015-11-18 DIAGNOSIS — K219 Gastro-esophageal reflux disease without esophagitis: Secondary | ICD-10-CM | POA: Diagnosis present

## 2015-11-18 DIAGNOSIS — I5032 Chronic diastolic (congestive) heart failure: Secondary | ICD-10-CM | POA: Diagnosis present

## 2015-11-18 DIAGNOSIS — A419 Sepsis, unspecified organism: Secondary | ICD-10-CM | POA: Diagnosis present

## 2015-11-18 DIAGNOSIS — I272 Other secondary pulmonary hypertension: Secondary | ICD-10-CM | POA: Diagnosis present

## 2015-11-18 DIAGNOSIS — E1122 Type 2 diabetes mellitus with diabetic chronic kidney disease: Secondary | ICD-10-CM | POA: Diagnosis present

## 2015-11-18 DIAGNOSIS — E43 Unspecified severe protein-calorie malnutrition: Secondary | ICD-10-CM | POA: Insufficient documentation

## 2015-11-18 DIAGNOSIS — E1121 Type 2 diabetes mellitus with diabetic nephropathy: Secondary | ICD-10-CM | POA: Diagnosis present

## 2015-11-18 DIAGNOSIS — Z825 Family history of asthma and other chronic lower respiratory diseases: Secondary | ICD-10-CM | POA: Diagnosis not present

## 2015-11-18 DIAGNOSIS — E7800 Pure hypercholesterolemia, unspecified: Secondary | ICD-10-CM | POA: Diagnosis present

## 2015-11-18 DIAGNOSIS — Z794 Long term (current) use of insulin: Secondary | ICD-10-CM | POA: Diagnosis not present

## 2015-11-18 DIAGNOSIS — C641 Malignant neoplasm of right kidney, except renal pelvis: Secondary | ICD-10-CM | POA: Diagnosis present

## 2015-11-18 DIAGNOSIS — R634 Abnormal weight loss: Secondary | ICD-10-CM | POA: Diagnosis present

## 2015-11-18 DIAGNOSIS — I4581 Long QT syndrome: Secondary | ICD-10-CM | POA: Diagnosis not present

## 2015-11-18 DIAGNOSIS — K8070 Calculus of gallbladder and bile duct without cholecystitis without obstruction: Secondary | ICD-10-CM | POA: Diagnosis present

## 2015-11-18 DIAGNOSIS — Z96642 Presence of left artificial hip joint: Secondary | ICD-10-CM | POA: Diagnosis present

## 2015-11-18 DIAGNOSIS — Z833 Family history of diabetes mellitus: Secondary | ICD-10-CM | POA: Diagnosis not present

## 2015-11-18 DIAGNOSIS — E876 Hypokalemia: Secondary | ICD-10-CM | POA: Diagnosis present

## 2015-11-18 DIAGNOSIS — K8050 Calculus of bile duct without cholangitis or cholecystitis without obstruction: Secondary | ICD-10-CM | POA: Diagnosis not present

## 2015-11-18 DIAGNOSIS — F0391 Unspecified dementia with behavioral disturbance: Secondary | ICD-10-CM | POA: Diagnosis present

## 2015-11-18 DIAGNOSIS — Z66 Do not resuscitate: Secondary | ICD-10-CM | POA: Diagnosis present

## 2015-11-18 DIAGNOSIS — J449 Chronic obstructive pulmonary disease, unspecified: Secondary | ICD-10-CM | POA: Diagnosis present

## 2015-11-18 DIAGNOSIS — A4151 Sepsis due to Escherichia coli [E. coli]: Secondary | ICD-10-CM | POA: Diagnosis present

## 2015-11-18 DIAGNOSIS — Z853 Personal history of malignant neoplasm of breast: Secondary | ICD-10-CM | POA: Diagnosis not present

## 2015-11-18 DIAGNOSIS — R945 Abnormal results of liver function studies: Secondary | ICD-10-CM

## 2015-11-18 DIAGNOSIS — G92 Toxic encephalopathy: Secondary | ICD-10-CM | POA: Diagnosis present

## 2015-11-18 DIAGNOSIS — Z87440 Personal history of urinary (tract) infections: Secondary | ICD-10-CM | POA: Diagnosis not present

## 2015-11-18 DIAGNOSIS — R32 Unspecified urinary incontinence: Secondary | ICD-10-CM | POA: Diagnosis present

## 2015-11-18 DIAGNOSIS — R410 Disorientation, unspecified: Secondary | ICD-10-CM | POA: Diagnosis present

## 2015-11-18 DIAGNOSIS — Z880 Allergy status to penicillin: Secondary | ICD-10-CM | POA: Diagnosis not present

## 2015-11-18 DIAGNOSIS — Z8673 Personal history of transient ischemic attack (TIA), and cerebral infarction without residual deficits: Secondary | ICD-10-CM | POA: Diagnosis not present

## 2015-11-18 DIAGNOSIS — K8020 Calculus of gallbladder without cholecystitis without obstruction: Secondary | ICD-10-CM | POA: Diagnosis not present

## 2015-11-18 DIAGNOSIS — R7881 Bacteremia: Secondary | ICD-10-CM | POA: Diagnosis not present

## 2015-11-18 DIAGNOSIS — I10 Essential (primary) hypertension: Secondary | ICD-10-CM

## 2015-11-18 DIAGNOSIS — R9431 Abnormal electrocardiogram [ECG] [EKG]: Secondary | ICD-10-CM | POA: Diagnosis present

## 2015-11-18 LAB — MAGNESIUM: MAGNESIUM: 1.5 mg/dL — AB (ref 1.7–2.4)

## 2015-11-18 LAB — URINALYSIS, ROUTINE W REFLEX MICROSCOPIC
Glucose, UA: NEGATIVE mg/dL
Nitrite: NEGATIVE
PROTEIN: 100 mg/dL — AB
Specific Gravity, Urine: 1.015 (ref 1.005–1.030)
pH: 6 (ref 5.0–8.0)

## 2015-11-18 LAB — CBC WITH DIFFERENTIAL/PLATELET
BASOS ABS: 0 10*3/uL (ref 0.0–0.1)
Basophils Relative: 0 %
Eosinophils Absolute: 0 10*3/uL (ref 0.0–0.7)
Eosinophils Relative: 0 %
HEMATOCRIT: 34.5 % — AB (ref 36.0–46.0)
Hemoglobin: 11.1 g/dL — ABNORMAL LOW (ref 12.0–15.0)
LYMPHS PCT: 4 %
Lymphs Abs: 0.8 10*3/uL (ref 0.7–4.0)
MCH: 28.5 pg (ref 26.0–34.0)
MCHC: 32.2 g/dL (ref 30.0–36.0)
MCV: 88.7 fL (ref 78.0–100.0)
Monocytes Absolute: 1.7 10*3/uL — ABNORMAL HIGH (ref 0.1–1.0)
Monocytes Relative: 9 %
NEUTROS ABS: 16.5 10*3/uL — AB (ref 1.7–7.7)
Neutrophils Relative %: 87 %
PLATELETS: 162 10*3/uL (ref 150–400)
RBC: 3.89 MIL/uL (ref 3.87–5.11)
RDW: 15.2 % (ref 11.5–15.5)
WBC: 19.1 10*3/uL — AB (ref 4.0–10.5)

## 2015-11-18 LAB — GLUCOSE, CAPILLARY
GLUCOSE-CAPILLARY: 137 mg/dL — AB (ref 65–99)
GLUCOSE-CAPILLARY: 110 mg/dL — AB (ref 65–99)
Glucose-Capillary: 122 mg/dL — ABNORMAL HIGH (ref 65–99)
Glucose-Capillary: 159 mg/dL — ABNORMAL HIGH (ref 65–99)

## 2015-11-18 LAB — URINE MICROSCOPIC-ADD ON

## 2015-11-18 LAB — MRSA PCR SCREENING: MRSA BY PCR: POSITIVE — AB

## 2015-11-18 LAB — TROPONIN I
TROPONIN I: 0.06 ng/mL — AB (ref <0.031)
TROPONIN I: 0.05 ng/mL — AB (ref <0.031)

## 2015-11-18 LAB — PROTIME-INR
INR: 1.26 (ref 0.00–1.49)
Prothrombin Time: 15.9 seconds — ABNORMAL HIGH (ref 11.6–15.2)

## 2015-11-18 LAB — LIPASE, BLOOD: Lipase: 17 U/L (ref 11–51)

## 2015-11-18 LAB — PROCALCITONIN: PROCALCITONIN: 6.01 ng/mL

## 2015-11-18 LAB — LACTIC ACID, PLASMA: Lactic Acid, Venous: 1.4 mmol/L (ref 0.5–2.0)

## 2015-11-18 MED ORDER — ONDANSETRON HCL 4 MG/2ML IJ SOLN
4.0000 mg | Freq: Four times a day (QID) | INTRAMUSCULAR | Status: DC | PRN
Start: 2015-11-18 — End: 2015-11-21
  Administered 2015-11-19: 4 mg via INTRAVENOUS
  Filled 2015-11-18: qty 2

## 2015-11-18 MED ORDER — METRONIDAZOLE IN NACL 5-0.79 MG/ML-% IV SOLN
500.0000 mg | Freq: Three times a day (TID) | INTRAVENOUS | Status: DC
  Administered 2015-11-18 – 2015-11-19 (×3): 500 mg via INTRAVENOUS
  Filled 2015-11-18 (×3): qty 100

## 2015-11-18 MED ORDER — SIMVASTATIN 10 MG PO TABS
10.0000 mg | ORAL_TABLET | Freq: Every day | ORAL | Status: DC
  Administered 2015-11-19 – 2015-11-20 (×3): 10 mg via ORAL
  Filled 2015-11-18 (×3): qty 1

## 2015-11-18 MED ORDER — PAROXETINE HCL 20 MG PO TABS
30.0000 mg | ORAL_TABLET | Freq: Every day | ORAL | Status: DC
  Administered 2015-11-18 – 2015-11-21 (×4): 30 mg via ORAL
  Filled 2015-11-18 (×4): qty 2

## 2015-11-18 MED ORDER — MAGNESIUM SULFATE 2 GM/50ML IV SOLN
2.0000 g | Freq: Once | INTRAVENOUS | Status: AC
  Administered 2015-11-18: 2 g via INTRAVENOUS
  Filled 2015-11-18: qty 50

## 2015-11-18 MED ORDER — POTASSIUM CHLORIDE 10 MEQ/100ML IV SOLN
10.0000 meq | INTRAVENOUS | Status: AC
  Filled 2015-11-18: qty 100

## 2015-11-18 MED ORDER — AZTREONAM 1 G IJ SOLR
1.0000 g | Freq: Three times a day (TID) | INTRAMUSCULAR | Status: DC
  Administered 2015-11-18 – 2015-11-20 (×7): 1 g via INTRAVENOUS
  Filled 2015-11-18 (×11): qty 1

## 2015-11-18 MED ORDER — ALLOPURINOL 300 MG PO TABS
150.0000 mg | ORAL_TABLET | Freq: Every day | ORAL | Status: DC
  Administered 2015-11-18 – 2015-11-21 (×4): 150 mg via ORAL
  Filled 2015-11-18 (×4): qty 1

## 2015-11-18 MED ORDER — LACTATED RINGERS IV SOLN
INTRAVENOUS | Status: DC
Start: 2015-11-18 — End: 2015-11-18

## 2015-11-18 MED ORDER — DEXTROSE 5 % IV SOLN
INTRAVENOUS | Status: AC
  Filled 2015-11-18: qty 1

## 2015-11-18 MED ORDER — POLYETHYLENE GLYCOL 3350 17 G PO PACK: 17.0000 g | PACK | Freq: Every day | ORAL | Status: DC | PRN

## 2015-11-18 MED ORDER — ENSURE ENLIVE PO LIQD
237.0000 mL | Freq: Two times a day (BID) | ORAL | Status: DC
  Administered 2015-11-18 – 2015-11-21 (×6): 237 mL via ORAL

## 2015-11-18 MED ORDER — POTASSIUM CHLORIDE CRYS ER 20 MEQ PO TBCR
40.0000 meq | EXTENDED_RELEASE_TABLET | Freq: Two times a day (BID) | ORAL | Status: DC
  Administered 2015-11-18 – 2015-11-21 (×7): 40 meq via ORAL
  Filled 2015-11-18 (×7): qty 2

## 2015-11-18 MED ORDER — POLYETHYL GLYCOL-PROPYL GLYCOL 0.4-0.3 % OP GEL
Freq: Two times a day (BID) | OPHTHALMIC | Status: DC
  Filled 2015-11-18: qty 10

## 2015-11-18 MED ORDER — MAGNESIUM SULFATE 2 GM/50ML IV SOLN: 2.0000 g | Freq: Once | INTRAVENOUS | Status: DC

## 2015-11-18 MED ORDER — MAGNESIUM HYDROXIDE 400 MG/5ML PO SUSP: 30.0000 mL | Freq: Every day | ORAL | Status: DC | PRN

## 2015-11-18 MED ORDER — VANCOMYCIN HCL IN DEXTROSE 1-5 GM/200ML-% IV SOLN
1000.0000 mg | INTRAVENOUS | Status: DC
  Administered 2015-11-19: 1000 mg via INTRAVENOUS
  Filled 2015-11-18: qty 200

## 2015-11-18 MED ORDER — SODIUM CHLORIDE 0.9% FLUSH
3.0000 mL | Freq: Two times a day (BID) | INTRAVENOUS | Status: DC
  Administered 2015-11-18 – 2015-11-21 (×5): 3 mL via INTRAVENOUS

## 2015-11-18 MED ORDER — ALUM & MAG HYDROXIDE-SIMETH 200-200-20 MG/5ML PO SUSP: 30.0000 mL | ORAL | Status: DC | PRN

## 2015-11-18 MED ORDER — UTI-STAT PO LIQD: 30.0000 mL | Freq: Every day | ORAL | Status: DC

## 2015-11-18 MED ORDER — ASPIRIN 325 MG PO TABS
325.0000 mg | ORAL_TABLET | Freq: Every day | ORAL | Status: DC
Start: 2015-11-18 — End: 2015-11-21
  Administered 2015-11-18 – 2015-11-21 (×4): 325 mg via ORAL
  Filled 2015-11-18 (×4): qty 1

## 2015-11-18 MED ORDER — GUAIFENESIN ER 600 MG PO TB12
600.0000 mg | ORAL_TABLET | Freq: Two times a day (BID) | ORAL | Status: DC
  Administered 2015-11-18 – 2015-11-21 (×6): 600 mg via ORAL
  Filled 2015-11-18 (×7): qty 1

## 2015-11-18 MED ORDER — IPRATROPIUM-ALBUTEROL 0.5-2.5 (3) MG/3ML IN SOLN: 3.0000 mL | RESPIRATORY_TRACT | Status: DC | PRN

## 2015-11-18 MED ORDER — LACTATED RINGERS IV BOLUS (SEPSIS)
1000.0000 mL | Freq: Once | INTRAVENOUS | Status: AC
  Administered 2015-11-18: 1000 mL via INTRAVENOUS

## 2015-11-18 MED ORDER — AZTREONAM 1 G IJ SOLR
500.0000 mg | Freq: Once | INTRAMUSCULAR | Status: AC
  Administered 2015-11-18: 500 mg via INTRAVENOUS
  Filled 2015-11-18: qty 0.5

## 2015-11-18 MED ORDER — DIVALPROEX SODIUM 125 MG PO DR TAB
125.0000 mg | DELAYED_RELEASE_TABLET | Freq: Two times a day (BID) | ORAL | Status: DC
  Administered 2015-11-18 – 2015-11-21 (×7): 125 mg via ORAL
  Filled 2015-11-18 (×9): qty 1

## 2015-11-18 MED ORDER — POTASSIUM CHLORIDE IN NACL 40-0.9 MEQ/L-% IV SOLN
INTRAVENOUS | Status: DC
  Administered 2015-11-18 – 2015-11-19 (×3): 100 mL/h via INTRAVENOUS

## 2015-11-18 MED ORDER — ONDANSETRON HCL 4 MG PO TABS: 4.0000 mg | ORAL_TABLET | Freq: Four times a day (QID) | ORAL | Status: DC | PRN

## 2015-11-18 MED ORDER — FAMOTIDINE 20 MG PO TABS
20.0000 mg | ORAL_TABLET | Freq: Two times a day (BID) | ORAL | Status: DC
Start: 2015-11-18 — End: 2015-11-21
  Administered 2015-11-18 – 2015-11-21 (×7): 20 mg via ORAL
  Filled 2015-11-18 (×7): qty 1

## 2015-11-18 MED ORDER — POLYVINYL ALCOHOL 1.4 % OP SOLN
1.0000 [drp] | Freq: Two times a day (BID) | OPHTHALMIC | Status: DC
  Administered 2015-11-18 – 2015-11-21 (×7): 1 [drp] via OPHTHALMIC
  Filled 2015-11-18 (×2): qty 15

## 2015-11-18 MED ORDER — HEPARIN SODIUM (PORCINE) 5000 UNIT/ML IJ SOLN
5000.0000 [IU] | Freq: Three times a day (TID) | INTRAMUSCULAR | Status: DC
  Administered 2015-11-18 – 2015-11-21 (×9): 5000 [IU] via SUBCUTANEOUS
  Filled 2015-11-18 (×9): qty 1

## 2015-11-18 MED ORDER — ACETAMINOPHEN 325 MG PO TABS
650.0000 mg | ORAL_TABLET | Freq: Four times a day (QID) | ORAL | Status: DC | PRN
  Administered 2015-11-19 (×2): 650 mg via ORAL
  Filled 2015-11-18 (×3): qty 2

## 2015-11-18 MED ORDER — INSULIN ASPART 100 UNIT/ML ~~LOC~~ SOLN
0.0000 [IU] | SUBCUTANEOUS | Status: DC
  Administered 2015-11-18: 3 [IU] via SUBCUTANEOUS
  Administered 2015-11-18 – 2015-11-20 (×6): 2 [IU] via SUBCUTANEOUS

## 2015-11-18 MED ORDER — BENZONATATE 100 MG PO CAPS: 100.0000 mg | ORAL_CAPSULE | Freq: Three times a day (TID) | ORAL | Status: DC | PRN

## 2015-11-18 MED ORDER — POTASSIUM CHLORIDE 10 MEQ/100ML IV SOLN
10.0000 meq | INTRAVENOUS | Status: AC
  Administered 2015-11-18 (×3): 10 meq via INTRAVENOUS
  Filled 2015-11-18 (×2): qty 100

## 2015-11-18 NOTE — Progress Notes (Addendum)
[place] for vancomycin, aztreonam Indication: sepsis  Allergies  Allergen Reactions  . Aleve [Naproxen Sodium]   . Carbapenems Other (See Comments)    unknown  . Cephalosporins Other (See Comments)    unknown  . Codeine Nausea Only  . Amoxicillin Rash  . Penicillins Rash    Patient Measurements: Height: 5\' 5"  (165.1 cm) Weight: 200 lb (90.719 kg) IBW/kg (Calculated) : 57 Adjusted Body Weight:   Vital Signs: Temp: 98.8 F (37.1 C) (03/18 0002) Temp Source: Rectal (03/18 0002) BP: 116/66 mmHg (03/18 0300) Pulse Rate: 87 (03/18 0300)  Labs:  Recent Labs  11/17/15 1910 11/17/15 2315  WBC 32.7* 28.7*  HGB 12.7 12.3  PLT 202 185  CREATININE 1.48* 1.87*    Estimated Creatinine Clearance: 21.8 mL/min (by C-G formula based on Cr of 1.87).  No results for input(s): VANCOTROUGH, VANCOPEAK, VANCORANDOM, GENTTROUGH, GENTPEAK, GENTRANDOM, TOBRATROUGH, TOBRAPEAK, TOBRARND, AMIKACINPEAK, AMIKACINTROU, AMIKACIN in the last 72 hours.   Microbiology: Recent Results (from the past 720 hour(s))  Blood Culture (routine x 2)     Status: None (Preliminary result)   Collection Time: 11/17/15 11:15 PM  Result Value Ref Range Status   Specimen Description BLOOD LEFT HAND  Final   Special Requests BOTTLES DRAWN AEROBIC AND ANAEROBIC [facility] EACH  Final   Culture PENDING  Incomplete   Report Status PENDING  Incomplete  Blood Culture (routine x 2)     Status: None (Preliminary result)   Collection Time: 11/17/15 11:54 PM  Result Value Ref Range Status   Specimen Description RIGHT ANTECUBITAL  Final   Special Requests BOTTLES DRAWN AEROBIC AND ANAEROBIC [facility] EACH  Final   Culture PENDING  Incomplete   Report Status PENDING  Incomplete    Medical History: Past Medical History  Diagnosis Date  . Breast cancer ([place])   . Essential hypertension, benign   . Depression   . GERD (gastroesophageal reflux disease)   . Hypercholesteremia   . CKD (chronic  kidney disease) stage 3, GFR 30-59 ml/min   . Pneumonia   . UTI (lower urinary tract infection)   . Arthritis   . Asthma   . Venous stasis   . Type 2 diabetes mellitus ([place])   . Chronic diastolic heart failure (HCC)     LVEF 70%  . Mitral stenosis     Moderate  . Secondary pulmonary hypertension (HCC)     PASP 64 mmHg  . CHF (congestive heart failure) (HCC)     Medications:   (Not in a hospital admission) Scheduled:   Infusions:  . lactated ringers 1,000 mL (11/18/15 0239)    Assessment: 82 yo admitted from [facility] center for elevated white count and increased confusion. Starting vancomycin and aztreonam for presumed sepsis.  Vanc 1 gm and aztreonam 500 mg IV given  Goal of Therapy:  Vancomycin trough level 15-20 mcg/ml  Plan:  Cont aztreonam 1gm IV q8 hours Cont vanc 1gm IV q48 hours F/u renal function, cultures and clinical course  Thanks for allowing pharmacy to be a part of this patient's care.  Excell Seltzer, PharmD Clinical Pharmacist 11/18/2015,3:19 AM

## 2015-11-18 NOTE — Progress Notes (Signed)
PROGRESS NOTE  [name] [id number] DOB: 11-07-1921 DOA: 11/17/2015 PCP: Cyndee Brightly, MD  Summary: 82 year old woman complex past medical history resident of skilled nursing facility presented with history fever, acute confusion, aspiration, multiple allergies including cephalosporins penicillins amoxicillin, carbopenems. Initial evaluation revealed significant leukocytosis, elevated lactic acid, bilirubin. Consider for sepsis secondary to UTI.  Assessment/Plan: 1. Possible early sepsis on admission: however, on admission euthermic, pulse normal, respirations normal. SIRS criteria 1 (leukocytosis). Lactic acid cleared with fluids. CT abd/pelvis no acute features. CXR no acute disease. 2. UTI. Culture pending. 3. AKI superimposed on CKD stage IV. 4. Elevated LFTs, mixed pattern. Etiology unclear. Patient has no abdominal pain on exam. No right upper quadrant pain. Eating breakfast. 5. Elevated troponin, flat consider demand ischemia vs elevation with AKI. 6. Acute encephalopathy superimposed on dementia with behavioral disturbance. Perhaps improved. She does follow commands but speaks very little. 7. Hypomagnesemia.  8. COPD appears stable. 9. DM type 2 with nephropathy. Stable. AG 14, random glucose 184. 10. Chronic diastolic CHF, appears compensated 11. Prolonged QT 12. Right renal mass concerning for RCC. Ultrasound pending. 77. PMH breast cancer, stroke, pulmonary HTN 14. Weight loss 15. Severe malnutrition   Appears hemodynamically stable. No evidence to suggest sepsis. We'll continue antibiotics for UTI. Doubt biliary pathology at this point.  CMP pending  RUQ u/s  Replace magnesium   Renal u/s.  Code Status: DNR DVT prophylaxis: Heparin Family Communication: No family bedside Disposition Plan: Anticipate discharge in 1-2 days if continues to improve  Murray Hodgkins, MD  [facility]  Direct contact number: see [email or web address]; password  [email or web address] If 7PM-7AM, please contact night-coverage at [email or web address] 11/18/2015, 10:35 AM  LOS: 0 days   Consultants:  None  Procedures:  None  Antibiotics:  Aztreonam 3/18>>   vancomycin 3/18 >>  HPI/Subjective: Feels good. Breathing is fine. Denies nausea and vomiting.   Objective: Filed Vitals:   11/18/15 0130 11/18/15 0230 11/18/15 0300 11/18/15 0437  BP: 106/47 106/50 116/66 106/43  Pulse: 81  87 89  Temp:      TempSrc:      Resp: 18 18 19 20   Height:    5\' 5"  (1.651 m)  Weight:    81.4 kg (179 lb 7.3 oz)  SpO2: 98%  94% 95%    Intake/Output Summary (Last 24 hours) at 11/18/15 1035 Last data filed at 11/18/15 0203  Gross per 24 hour  Intake   2000 ml  Output      0 ml  Net   2000 ml     Filed Weights   11/17/15 2249 11/18/15 0437  Weight: 90.719 kg (200 lb) 81.4 kg (179 lb 7.3 oz)    Exam: General:  Appears calm and comfortable. Sitting up in chair.  Eyes: PERRL, normal lids, irises & conjunctiva. Bilateral blepharitis. ENT: grossly normal hearing, lips & tongue Cardiovascular: RRR, 2/6 systolic murmur. No rub or gallop. No LE edema. Telemetry: SR, no arrhythmias  Respiratory: CTA bilaterally, no w/r/r. Normal respiratory effort. Abdomen: soft, ntnd Skin: no rash or induration noted Musculoskeletal: grossly normal tone BUE/BLE Psychiatric: speech fluent and appropriate But speaks very little. Follows simple commands.  Neurologic: grossly non-focal.  New data reviewed:  CBG stable  Mg 1.5   Lipase normal  Troponins flat, 0.05   If he lactic acid within normal limits  WBC down to 19.1   Scheduled Meds: . allopurinol  150 mg Oral Daily  . aspirin  325 mg Oral Daily  . aztreonam  1  g Intravenous 3 times per day  . divalproex  125 mg Oral BID  . famotidine  20 mg Oral BID  . feeding supplement (ENSURE ENLIVE)  237 mL Oral BID BM  . guaiFENesin  600 mg Oral BID  . heparin  5,000 Units Subcutaneous 3 times per day  . insulin aspart  0-15  Units Subcutaneous 6 times per day  . magnesium sulfate 1 - 4 g bolus IVPB  2 g Intravenous Once  . PARoxetine  30 mg Oral Daily  . polyvinyl alcohol  1 drop Both Eyes BID  . potassium chloride  10 mEq Intravenous Q1 Hr x 3  . potassium chloride SA  40 mEq Oral BID  . simvastatin  10 mg Oral QHS  . sodium chloride flush  3 mL Intravenous Q12H  . [START ON 11/19/2015] vancomycin  1,000 mg Intravenous Q48H   Continuous Infusions: . 0.9 % NaCl with KCl 40 mEq / L 100 mL/hr (11/18/15 0646)    Principal Problem:   UTI (lower urinary tract infection) Active Problems:   Essential hypertension, benign   Type 2 diabetes with nephropathy (HCC)   Chronic diastolic heart failure (HCC)   Chronic kidney disease (CKD), stage IV (severe) (HCC)   Acute encephalopathy   Prolonged Q-T interval on ECG   Loss of weight   Renal mass, right   Protein-calorie malnutrition, severe   AKI (acute kidney injury) ([place])   Elevated LFTs   Time spent 25 minutes   By signing my name below, I, Rennis Harding attest that this documentation has been prepared under the direction and in the presence of Murray Hodgkins, MD Electronically signed: Rennis Harding  11/18/2015 10:20am   I personally performed the services described in this documentation. All medical record entries made by the scribe were at my direction. I have reviewed the chart and agree that the record reflects my personal performance and is accurate and complete. Murray Hodgkins, MD

## 2015-11-18 NOTE — Progress Notes (Signed)
Initial Nutrition Assessment  DOCUMENTATION CODES:  Severe malnutrition in context of chronic illness   Pt meets criteria for SEVERE MALNUTRITION in the context of CHRONIC ILLNESS as evidenced by loss of >7.5% bw in 3 months and an estimated oral intake that met < or equal to 75% of needs for > or equal to 1 month.  INTERVENTION:  Ensure Enlive po BID, each supplement provides 350 kcal and 20 grams of protein   [employer] at [employer]  NUTRITION DIAGNOSIS:  Increased nutrient needs related to acute illness as evidenced by estimated nutritional requirements for this condition  GOAL:  Patient will meet greater than or equal to 90% of their needs  MONITOR:  PO intake, Supplement acceptance, Labs  REASON FOR ASSESSMENT:  Malnutrition Screening Tool    ASSESSMENT:  82 y/o female PMHx Breast cancer, CHF, htn, ckd 3, Dm2, recurrent UTI, gerd, Depression, dementia, COPD, hypercholesterolemia, HLD presents with AMS and severe leukocytosis. Admitted for severe sepsis.   RD operating remotely, but well known to this RD as she has been followed closely by him recently at [facility].   There is a very good weight history on her as she is weighed roughly every other day due to her hx of HF/CKD/Diuretic use. Her weight has been slowly trending down with brief period of weight rebounding, only to subsequently drop to a new low a couple weeks later. She has lost 7 lbs (4.1% bw) x 30 days, 19 lbs (9.7%) in 90 days, and 37 lbs (17.1%) x 1 year.   Pt's appetite has always been dependant on her mood. In the mornings she also typically eats poorly due to difficulty of staying awake.   Of note, RD had discussion w/ daughter 2 days ago about continual decline. Daughter reported that the resident had stated to her that "none of this is helping", in regards to her medication/care, and daughter believes that resident is "giving up".   Pt was receiving a Dysphagia 3, consistent CC diet. She was receiving  Ensure Enlive BID mixed in ice cream. Her supplement intake also varied with her mood. . Also received KCL, Uti stat, cranberry.  Labs reviewed: Hypomagnesemia, lactic acid now WDL, elevated liver enzymes, leukocytosis, elevated bilirubin, hypoalbuminemia  NPFE: Unable to conduct  Diet Order:  Diet heart healthy/carb modified Room service appropriate?: Yes; Fluid consistency:: Thin  Skin:Dry  Last BM:  3/18- incontinent  Height:  Ht Readings from Last 1 Encounters:  11/18/15 5' 5" (1.651 m)   Weight:  Wt Readings from Last 1 Encounters:  11/18/15 179 lb 7.3 oz (81.4 kg)   Wt Readings from Last 10 Encounters:  11/18/15 179 lb 7.3 oz (81.4 kg)  11/13/14 212 lb 12.8 oz (96.525 kg)  11/14/14 212 lb (96.163 kg)  04/20/14 214 lb 4.6 oz (97.2 kg)  02/04/14 218 lb (98.884 kg)  01/22/14 219 lb (99.338 kg)  12/13/13 220 lb 0.3 oz (99.8 kg)  09/28/13 218 lb (98.884 kg)  09/06/13 218 lb 4 oz (98.998 kg)  05/31/13 203 lb (92.08 kg)   Ideal Body Weight:  56.82 kg  BMI:  Body mass index is 29.86 kg/(m^2).  Estimated Nutritional Needs:  Kcal:  1650-1800 (20-22 kcal/kg bw) Protein:  74-85 g (1.3-1.5 g/kg ibw) Fluid:  1.7-1.9 liters fluid  EDUCATION NEEDS:  No education needs identified at this time  Burtis Junes RD, LDN Nutrition Pager: [phone] 11/18/2015 8:54 AM

## 2015-11-18 NOTE — ED Notes (Signed)
[facility] advised pt is being admitted to the hospital.

## 2015-11-18 NOTE — Progress Notes (Signed)
Received critical lab report. Pt's blood cultures showing gram negative rods aerobic and anaerobic bottle 1 and gram negative rods anaerobic bottle 2. MD notified and made aware.

## 2015-11-18 NOTE — H&P (Signed)
[facility] History and Physical  [name] UXL:[id number] DOB: [birth year] DOA: 11/17/2015  Referring physician: ED physician PCP: Cyndee Brightly, MD  Specialists: Dr. Domenic Polite (cardiology)   Chief Complaint:  Respiratory distress, decreased LOC   HPI: [name] is a 82 y.o. female with PMH of chronic diastolic CHF, CKD stage III, hypertension, breast cancer in remission, and type 2 diabetes mellitus who presents from her SNF with respiratory distress and depressed level of consciousness. Patient is unable to provide a history, but her daughter is at bedside who helps fill in. Additional history obtained through chart review, discussion with the ED personnel, and SNF report. Patient has undergone progressive weight loss over the past few months of up to 60 pounds per the report of her daughter. Over this same interval, she has become increasingly somnolent and not interested in participating with her treatment at the SNF. She has been refusing medications with increasing frequency and her daughter feels as though she is "giving up." SNF personnel of noted increased work of breathing and markedly depressed level of consciousness today with the patient not verbally responding. She will apparently look to voice and make brief eye contact before returning to sleep. There's been no fevers, diarrhea, or vomiting reported. No productive cough has been noted. She has recurrent UTI and is incontinent of urine at baseline.  In ED, patient was found to be afebrile, saturating adequately on room air, and with vital signs stable. Chest x-ray was negative for acute cardiopulmonary disease and initial blood work was notable for a leukocytosis to 33,000. Urinalysis features many bacteria, trace leukocytes, moderate nitrites, small hemoglobin, and too numerous to count white blood cells. EKG was obtained and features a sinus rhythm with QTc prolonged at 566 ms. Lactic acid returned  elevated to a value of 3.17 and a CT of the abdomen and pelvis was obtained. CT demonstrates an approximately 4.3 cm mass in the lower pole of the right kidney which is felt to most likely represent a renal cell carcinoma. Patient was bolused with 2 L of lactated Ringer's in the emergency department, blood and urine cultures were obtained, and empiric antibiotics were initiated with vancomycin and aztreonam. Patient remained stable in the ED and will be admitted to telemetry for ongoing evaluation and management of newly identified renal mass and suspected severe sepsis with UTI and altered mental status.  Where does patient live?  SNF      Can patient participate in ADLs?  Barely    Review of Systems:  Unable to obtain ROS secondary to patient's clinical condition with acute encephalopathy and non-verbal state.     Allergy:  Allergies  Allergen Reactions  . Aleve [Naproxen Sodium]   . Carbapenems Other (See Comments)    unknown  . Cephalosporins Other (See Comments)    unknown  . Codeine Nausea Only  . Amoxicillin Rash  . Penicillins Rash    Past Medical History  Diagnosis Date  . Breast cancer ([place])   . Essential hypertension, benign   . Depression   . GERD (gastroesophageal reflux disease)   . Hypercholesteremia   . CKD (chronic kidney disease) stage 3, GFR 30-59 ml/min   . Pneumonia   . UTI (lower urinary tract infection)   . Arthritis   . Asthma   . Venous stasis   . Type 2 diabetes mellitus ([place])   . Chronic diastolic heart failure (HCC)     LVEF 70%  . Mitral stenosis  Moderate  . Secondary pulmonary hypertension (HCC)     PASP 64 mmHg  . CHF (congestive heart failure) [facility])     Past Surgical History  Procedure Laterality Date  . Replacement total knee    . Mastectomy    . Total hip arthroplasty    . Carotid endarterectomy    . Cataracts      Social History:  reports that she has never smoked. She has never used smokeless tobacco. She reports that she  does not drink alcohol or use illicit drugs.  Family History:  Family History  Problem Relation Age of Onset  . Asthma Other   . Diabetes Other      Prior to Admission medications   Medication Sig Start Date End Date Taking? Authorizing Provider  acetaminophen (TYLENOL) 325 MG tablet Take 650 mg by mouth every 6 (six) hours as needed for mild pain.     Historical Provider, MD  albuterol (PROVENTIL HFA;VENTOLIN HFA) 108 (90 BASE) MCG/ACT inhaler Inhale 2 puffs into the lungs every 6 (six) hours as needed for wheezing or shortness of breath.    Historical Provider, MD  allopurinol (ZYLOPRIM) 100 MG tablet TAKE (2) TABLETS BY MOUTH ONCE DAILY. 08/07/13   Mikey Kirschner, MD  alum & mag hydroxide-simeth [facility]) 200-200-20 MG/5ML suspension Take by mouth every 4 (four) hours as needed for indigestion or heartburn.    Historical Provider, MD  amLODipine (NORVASC) 10 MG tablet Take 10 mg by mouth daily.    Historical Provider, MD  aspirin 325 MG tablet Take 1 tablet (325 mg total) by mouth daily. 11/15/14   Kathie Dike, MD  benzonatate (TESSALON) 100 MG capsule Take 1 capsule (100 mg total) by mouth 3 (three) times daily as needed for cough. 04/19/14   Kathie Dike, MD  Cranberry 475 MG CAPS Take 1 capsule by mouth every 12 (twelve) hours.    Historical Provider, MD  Cranberry-Vitamin C-Inulin (UTI-STAT) LIQD Take 30 mLs by mouth daily.    Historical Provider, MD  divalproex (DEPAKOTE) 125 MG DR tablet Take 125 mg by mouth 2 (two) times daily.    Historical Provider, MD  fenofibrate 54 MG tablet Take 1 tablet (54 mg total) by mouth daily. Patient not taking: Reported on 11/14/2014 12/14/13   Radene Gunning, NP  guaiFENesin (MUCINEX) 600 MG 12 hr tablet Take 1 tablet (600 mg total) by mouth 2 (two) times daily. 04/19/14   Kathie Dike, MD  guaifenesin (TUSSIN) 100 MG/5ML syrup Take 200 mg by mouth 2 (two) times daily as needed for cough.     Historical Provider, MD  insulin glargine (LANTUS) 100  UNIT/ML injection Inject 5 Units into the skin at bedtime.    Historical Provider, MD  insulin lispro (HUMALOG) 100 UNIT/ML injection Inject 2-6 Units into the skin 3 (three) times daily before meals. Sliding scale as follows: 200-250=2units 251-300=4units 301-350=6units If CBG greater than 350 give 6 units and call Provider    Historical Provider, MD  ipratropium-albuterol (DUONEB) 0.5-2.5 (3) MG/3ML SOLN Take 3 mLs by nebulization every 4 (four) hours as needed (shortness of breath).    Historical Provider, MD  magnesium hydroxide (MILK OF MAGNESIA) 400 MG/5ML suspension Take 30 mLs by mouth daily as needed for mild constipation.    Historical Provider, MD  metoprolol tartrate (LOPRESSOR) 25 MG tablet Take 25 mg by mouth 2 (two) times daily. Take one-half tab equal 12.5 mg bid    Historical Provider, MD  oxybutynin (DITROPAN-XL) 5 MG 24  hr tablet Take 1 tablet (5 mg total) by mouth at bedtime. Patient not taking: Reported on 11/14/2014 09/06/13   Mikey Kirschner, MD  PARoxetine (PAXIL) 30 MG tablet Take 30 mg by mouth daily.    Historical Provider, MD  Polyethyl Glycol-Propyl Glycol (SYSTANE OP) Place 1 drop into both eyes 2 (two) times daily.    Historical Provider, MD  potassium chloride SA (K-DUR,KLOR-CON) 20 MEQ tablet Take 40 mEq by mouth 2 (two) times daily.     Historical Provider, MD  ranitidine (ZANTAC) 150 MG tablet Take 150 mg by mouth at bedtime.    Historical Provider, MD  simvastatin (ZOCOR) 10 MG tablet Take 10 mg by mouth at bedtime.    Historical Provider, MD  Spacer/Aero-Holding Chambers (AEROCHAMBER MV) inhaler Use as instructed. 2 puffs BID Patient not taking: Reported on 11/14/2014 09/28/13   Mikey Kirschner, MD  torsemide (DEMADEX) 20 MG tablet Take 80 mg by mouth 2 (two) times daily. Takes  80 mg every morning 60 mg every afternoon    Historical Provider, MD    Physical Exam: Filed Vitals:   11/18/15 0130 11/18/15 0230 11/18/15 0300 11/18/15 0437  BP: 106/47 106/50  116/66 106/43  Pulse: 81  87 89  Temp:      TempSrc:      Resp: '18 18 19 20  '$ Height:    '5\' 5"'$  (1.651 m)  Weight:    81.4 kg (179 lb 7.3 oz)  SpO2: 98%  94% 95%   General: Not in acute distress HEENT:       Eyes: PERRL, EOMI, no scleral icterus or conjunctival pallor. Yellow crust to b/l eyelashes       ENT: No discharge from the ears or nose, oral mucosa moist.        Neck: No JVD, no bruit, no appreciable mass Heme: No cervical adenopathy, no pallor Cardiac: S1/S2, RRR, grade III systolic murmur at LSB, No gallops or rubs. Pulm: Good air movement bilaterally. No rales, wheezing, rhonchi or rubs. Abd: Soft, nondistended, nontender, no rebound pain or gaurding, BS present. Ext: Trace LE edema bilaterally. 2+DP/PT pulse bilaterally. Musculoskeletal: No gross deformity, no red, hot, swollen joints   Skin: No rashes or wounds on exposed surfaces  Neuro: Somnolent, easily roused, non-verbal, makes brief eye-contact, no facial asymmetry, PERRL, EOMI, patellar DTRs 2+ b/l. Babinski is down-going b/l.   Psych: Unable to assess given the clinical scenario with lethargy and confusion.   Labs on Admission:  Basic Metabolic Panel:  Recent Labs Lab 11/17/15 1910 11/17/15 2315  NA 139 141  K 3.4* 3.9  CL 103 104  CO2 24 24  GLUCOSE 178* 184*  BUN 35* 39*  CREATININE 1.48* 1.87*  CALCIUM 8.7* 9.0   Liver Function Tests:  Recent Labs Lab 11/17/15 1910 11/17/15 2315  AST 216* 194*  ALT 140* 136*  ALKPHOS 135* 139*  BILITOT 2.7* 2.6*  PROT 7.6 7.2  ALBUMIN 3.5 3.3*   No results for input(s): LIPASE, AMYLASE in the last 168 hours. No results for input(s): AMMONIA in the last 168 hours. CBC:  Recent Labs Lab 11/17/15 1910 11/17/15 2315  WBC 32.7* 28.7*  NEUTROABS 29.8* 25.4*  HGB 12.7 12.3  HCT 39.3 38.1  MCV 88.7 88.0  PLT 202 185   Cardiac Enzymes:  Recent Labs Lab 11/17/15 2315  TROPONINI 0.06*    BNP (last 3 results) No results for input(s): BNP in the  last 8760 hours.  ProBNP (last 3 results) No  results for input(s): PROBNP in the last 8760 hours.  CBG: No results for input(s): GLUCAP in the last 168 hours.  Radiological Exams on Admission: Ct Abdomen Pelvis Wo Contrast  11/18/2015  CLINICAL DATA:  Acute onset of fever and leukocytosis. Confusion. Left lower quadrant abdominal pain. Initial encounter. EXAM: CT ABDOMEN AND PELVIS WITHOUT CONTRAST TECHNIQUE: Multidetector CT imaging of the abdomen and pelvis was performed following the standard protocol without IV contrast. COMPARISON:  None. FINDINGS: Mild bibasilar atelectasis or scarring is noted. Calcification is noted at the mitral valve. The liver and spleen are unremarkable in appearance. Stones are noted dependently within the gallbladder. The gallbladder is otherwise unremarkable. The pancreas and adrenal glands are unremarkable. There appears be a somewhat complex 4.1 cm mass arising at the lower pole of the right kidney. This is concerning for renal cell carcinoma. A 3.0 cm cyst is noted near the upper pole of the right kidney. Nonspecific perinephric stranding is noted bilaterally. There is no evidence of hydronephrosis. No renal or ureteral stones are identified. No free fluid is identified. The small bowel is unremarkable in appearance. The stomach is within normal limits. No acute vascular abnormalities are seen. Scattered calcification is noted along the abdominal aorta and its branches. The appendix is not well characterized; there is no evidence of appendicitis. Mild diverticulosis is noted along the mid sigmoid colon. The colon is otherwise unremarkable. The bladder is mildly distended and grossly unremarkable. A mildly calcified uterine fibroid is seen. The uterus is otherwise unremarkable. No suspicious adnexal masses are seen. No inguinal lymphadenopathy is seen. No acute osseous abnormalities are identified. The patient's left hip arthroplasty is incompletely imaged but appears  grossly unremarkable. Multilevel vacuum phenomenon is noted along the lumbar spine. IMPRESSION: 1. Apparent complex 4.1 cm mass at the lower pole of the right kidney. This is concerning for renal cell carcinoma. Renal ultrasound would be helpful for further evaluation, as deemed clinically appropriate. 2. Cholelithiasis.  Gallbladder otherwise unremarkable. 3. Mild bibasilar atelectasis or scarring noted. 4. Calcification noted at the mitral valve. 5. Right renal cyst noted. 6. Scattered calcification along the abdominal aorta and its branches. 7. Mild diverticulosis along the mid sigmoid colon. 8. Mildly calcified uterine fibroid noted. 9. Mild degenerative change along the lumbar spine. Electronically Signed   By: Garald Balding M.D.   On: 11/18/2015 02:47   Dg Chest 2 View  11/17/2015  CLINICAL DATA:  Fever.  Altered mental status. EXAM: CHEST  2 VIEW COMPARISON:  09/05/2015. FINDINGS: Stable enlarged cardiac silhouette. Interval small amount of linear atelectasis in the left lower lung zone. Mild diffuse peribronchial thickening without significant change. Stable calcified mediastinal and right hilar lymph nodes. Thoracic spine degenerative changes. Left axillary surgical clips. IMPRESSION: 1. No acute abnormality. 2. Stable cardiomegaly and chronic bronchitic changes. Electronically Signed   By: Claudie Revering M.D.   On: 11/17/2015 20:08   Dg Chest Port 1 View  11/17/2015  CLINICAL DATA:  Acute onset of fever and shortness of breath. Code sepsis. Initial encounter. EXAM: PORTABLE CHEST 1 VIEW COMPARISON:  Chest radiograph performed earlier today at 7:48 p.m. FINDINGS: The lungs are mildly hypoexpanded. Mild peribronchial thickening is noted, with minimal bilateral atelectasis. There is no evidence of pleural effusion or pneumothorax. The cardiomediastinal silhouette is within normal limits. No acute osseous abnormalities are seen. Mild degenerative change is noted at the glenohumeral joints bilaterally.  Clips are seen overlying the left axilla. IMPRESSION: Lungs mildly hypoexpanded. Mild chronic peribronchial thickening, with minimal  bilateral atelectasis. Electronically Signed   By: Garald Balding M.D.   On: 11/17/2015 23:55    EKG: Independently reviewed.  Abnormal findings:  Sinus rhythm, QTc 566 ms    Assessment/Plan  1. Severe sepsis secondary to UTI    - Criteria met on admission with leukocytosis to 33,000, lactate 3.17, UTI, and acute encephalopathy  - Bolus completed in ED  - Blood and urine cxs incubating  - Continue empiric tx with vanc and aztreonam while awaiting culture data  - Trend lactate, procalcitonin    2. Acute toxic-metabolic encephalopathy   - Suspected secondary to acute cystitis with sepsis  - If fails to improve as expected with treatment of infection, may need to extend w/u    3. Type II DM  - Using low-dose insulin at SNF, Lantus 5 units qHS and sliding-scale Humalog  - Will continue with a moderate-intensity SSI correctional for now, no basal  - Monitor CBG q4h while not eating, adjust SSI or add basal prn  - Carb-modified diet when appropriate   4. CKD stage III  - SCr 1.48 on admission, up from apparent baseline of ~1.2  - Received fluid bolus in ED, continuing with gentle IVF hydration  - Trend, avoid nephrotoxins where possible    5. Right renal mass  - CT appearance suggests likely RCC  - Renal US ordered to further characterize  - Discussed findings with patient's daughter (POA); she is more interested in a palliative approach, believing this would be in line with pt's wishes   - Daughter agrees with renal US, non-invasive studies to aid prognosis  - Palliative consultation requested   6. Prolonged QT interval  - QTc prolonged to 566 ms  - Hypokalemia may be contributing, will replace  - Check mag level and replete prn  - Monitor on telemetry, avoid agents known to prolong (eg. anti-emetics, opiate, FQ)   DVT ppx: SQ Heparin    Code  Status: Full code Family Communication:  Yes, patient's daughter at bed side Disposition Plan: Admit to inpatient   Date of Service 11/18/2015    Vianne Bulls, MD [facility] Pager [phone]  If 7PM-7AM, please contact night-coverage [email or web address] Password [email or web address] 11/18/2015, 6:00 AM

## 2015-11-18 NOTE — ED Notes (Signed)
CRITICAL VALUE ALERT  Critical value received:  Lactic acid 2.32  Date of notification:  11-18-15  Time of notification:  0248  Critical value read back:Yes.    Nurse who received alert:  Derek Mound, RN  MD notified (1st page):  Kathrynn Humble  Time of first page:  0248  MD notified (2nd page):  Time of second page:  Responding MD:  Kathrynn Humble  Time MD responded:  [phone]

## 2015-11-19 ENCOUNTER — Encounter (HOSPITAL_COMMUNITY): Payer: Self-pay | Admitting: Family Medicine

## 2015-11-19 ENCOUNTER — Encounter: Payer: Self-pay | Admitting: Internal Medicine

## 2015-11-19 ENCOUNTER — Inpatient Hospital Stay (HOSPITAL_COMMUNITY): Payer: Medicare HMO

## 2015-11-19 DIAGNOSIS — K8050 Calculus of bile duct without cholangitis or cholecystitis without obstruction: Secondary | ICD-10-CM

## 2015-11-19 DIAGNOSIS — R6521 Severe sepsis with septic shock: Secondary | ICD-10-CM

## 2015-11-19 DIAGNOSIS — E1121 Type 2 diabetes mellitus with diabetic nephropathy: Secondary | ICD-10-CM

## 2015-11-19 DIAGNOSIS — N2889 Other specified disorders of kidney and ureter: Secondary | ICD-10-CM

## 2015-11-19 DIAGNOSIS — K8021 Calculus of gallbladder without cholecystitis with obstruction: Secondary | ICD-10-CM

## 2015-11-19 DIAGNOSIS — A419 Sepsis, unspecified organism: Secondary | ICD-10-CM

## 2015-11-19 DIAGNOSIS — K8020 Calculus of gallbladder without cholecystitis without obstruction: Secondary | ICD-10-CM

## 2015-11-19 HISTORY — DX: Calculus of bile duct without cholangitis or cholecystitis without obstruction: K80.50

## 2015-11-19 LAB — COMPREHENSIVE METABOLIC PANEL
ALK PHOS: 138 U/L — AB (ref 38–126)
ALT: 74 U/L — ABNORMAL HIGH (ref 14–54)
ANION GAP: 7 (ref 5–15)
AST: 61 U/L — ABNORMAL HIGH (ref 15–41)
Albumin: 2.8 g/dL — ABNORMAL LOW (ref 3.5–5.0)
BUN: 24 mg/dL — ABNORMAL HIGH (ref 6–20)
CALCIUM: 8.4 mg/dL — AB (ref 8.9–10.3)
CO2: 22 mmol/L (ref 22–32)
Chloride: 109 mmol/L (ref 101–111)
Creatinine, Ser: 1.09 mg/dL — ABNORMAL HIGH (ref 0.44–1.00)
GFR calc non Af Amer: 43 mL/min — ABNORMAL LOW (ref >60)
GFR, EST AFRICAN AMERICAN: 50 mL/min — AB (ref >60)
Glucose, Bld: 157 mg/dL — ABNORMAL HIGH (ref 65–99)
Potassium: 4.2 mmol/L (ref 3.5–5.1)
SODIUM: 138 mmol/L (ref 135–145)
Total Bilirubin: 1.6 mg/dL — ABNORMAL HIGH (ref 0.3–1.2)
Total Protein: 6.4 g/dL — ABNORMAL LOW (ref 6.5–8.1)

## 2015-11-19 LAB — GLUCOSE, CAPILLARY
GLUCOSE-CAPILLARY: 126 mg/dL — AB (ref 65–99)
GLUCOSE-CAPILLARY: 146 mg/dL — AB (ref 65–99)
GLUCOSE-CAPILLARY: 113 mg/dL — AB (ref 65–99)
GLUCOSE-CAPILLARY: 118 mg/dL — AB (ref 65–99)
Glucose-Capillary: 124 mg/dL — ABNORMAL HIGH (ref 65–99)
Glucose-Capillary: 130 mg/dL — ABNORMAL HIGH (ref 65–99)

## 2015-11-19 LAB — CBC
HCT: 33.3 % — ABNORMAL LOW (ref 36.0–46.0)
HEMOGLOBIN: 10.9 g/dL — AB (ref 12.0–15.0)
MCH: 28.8 pg (ref 26.0–34.0)
MCHC: 32.7 g/dL (ref 30.0–36.0)
MCV: 87.9 fL (ref 78.0–100.0)
Platelets: 152 10*3/uL (ref 150–400)
RBC: 3.79 MIL/uL — AB (ref 3.87–5.11)
RDW: 15.3 % (ref 11.5–15.5)
WBC: 13 10*3/uL — AB (ref 4.0–10.5)

## 2015-11-19 NOTE — Progress Notes (Addendum)
PROGRESS NOTE  [name] [id number] DOB: 31-Oct-1921 DOA: 11/17/2015 PCP: Taylor Brightly, MD  Summary: 82 year old woman complex past medical history resident of skilled nursing facility presented with history fever, acute confusion, aspiration, multiple allergies including cephalosporins penicillins amoxicillin, carbopenems. Initial evaluation revealed significant leukocytosis, elevated lactic acid, bilirubin. Consider for sepsis secondary to UTI.  Assessment/Plan: 1. Gram-negative bacteremia secondary to UTI. Lactic acid cleared with fluids. CT abd/pelvis no acute features. CXR no acute disease. 2. Gram-negative rod UTI. Culture pending. Daughter bedside reports frequent UTIs. 3. Elevated LFTs, mixed pattern. Asymptomatic, no abdominal pain. Trending downwards. Consideration can certainly be given to sepsis, gallbladder ultrasound shows multiple stones. Suspect transient choledocholithiasis.  4. AKI superimposed on CKD stage IV, resolved 5. Elevated troponin, flat consider demand ischemia vs elevation with AKI. No further evaluation suggested. 6. Acute encephalopathy superimposed on dementia with behavioral disturbance. Acute component resolved. Appears to be at baseline. 7. COPD, remains stable 8. DM type 2 with nephropathy, remains stable 9. Chronic diastolic CHF, compensated 10. Prolonged QT 11. Right renal mass concerning for RCC. Follow up MRI as recommended. Daughter wishes conservative approach. 12. PMH breast cancer, stroke, pulmonary HTN 13. Weight loss 14. Severe malnutrition, in the context of chronic illness. Nutrition following.    Appears to be improving rapidly. Sepsis symptomatology resolved. Narrowed to aztreonam based on cultures. Hopefully can transition to oral therapy in 24 hours.   Discussed in detail with daughter who has healthcare power of attorney, [name]. We discussed renal mass suspicious for cancer. She wishes to pursue a conservative course,  no surgery or further evaluation. Wishes also to avoid surgery for gallbladder.   Anticipate discharge next 24 hours depending on culture data availability.  Code Status: DNR DVT prophylaxis: Heparin Family Communication: Daughter, [name] bedside. Disposition Plan:  Taylor Hodgkins, MD  [facility]  Direct contact number: see [email or web address]; password [email or web address] If 7PM-7AM, please contact night-coverage at [email or web address] 11/19/2015, 7:30 AM  LOS: 1 day   Consultants:  None  Procedures:  None  Antibiotics:  Aztreonam 3/18>>   vancomycin 3/18 >>  HPI/Subjective: Feeling tired with mild nausea. Denies vomiting, pain or breathing difficulty.  Per daughter she has been intermittently refusing eating and medication outside of hospitalization. She reports right arm weakness for about six months. She does not ambulate, at baseline uses wheelchair to go to the bathroom, is able to transfer.   Objective: Filed Vitals:   11/18/15 0437 11/18/15 1449 11/18/15 2159 11/19/15 0614  BP: 106/43 118/87  157/70  Pulse: 89 89 103 74  Temp:   96.8 F (36 C) 98.6 F (37 C)  TempSrc:   Oral Oral  Resp: 20 20 20 20   Height: 5\' 5"  (1.651 m)     Weight: 81.4 kg (179 lb 7.3 oz)     SpO2: 95% 100% 98% 95%    Intake/Output Summary (Last 24 hours) at 11/19/15 0730 Last data filed at 11/18/15 1835  Gross per 24 hour  Intake 2296.33 ml  Output      0 ml  Net 2296.33 ml     Filed Weights   11/17/15 2249 11/18/15 0437  Weight: 90.719 kg (200 lb) 81.4 kg (179 lb 7.3 oz)    Exam: General:  Appears calm and comfortable.   Cardiovascular: RRR, No rub or gallop. No LE edema. Respiratory: CTA bilaterally, no w/r/r. Normal respiratory effort. Abdomen: soft, ntnd positive bowel sounds.  Musculoskeletal: grossly normal tone BUE/BLE. Moves all extremities.  Psychiatric: speech fluent and appropriate. Follows simple  commands.   New data reviewed:  Blood sugars stable.   BUN and creatinine of  nearly normalized  LFTs trending down  WBC trending towards normal, 13.0  Hgb stable, 10.9  Scheduled Meds: . allopurinol  150 mg Oral Daily  . aspirin  325 mg Oral Daily  . aztreonam  1 g Intravenous 3 times per day  . divalproex  125 mg Oral BID  . famotidine  20 mg Oral BID  . feeding supplement (ENSURE ENLIVE)  237 mL Oral BID BM  . guaiFENesin  600 mg Oral BID  . heparin  5,000 Units Subcutaneous 3 times per day  . insulin aspart  0-15 Units Subcutaneous 6 times per day  . metronidazole  500 mg Intravenous Q8H  . PARoxetine  30 mg Oral Daily  . polyvinyl alcohol  1 drop Both Eyes BID  . potassium chloride SA  40 mEq Oral BID  . simvastatin  10 mg Oral QHS  . sodium chloride flush  3 mL Intravenous Q12H  . vancomycin  1,000 mg Intravenous Q48H   Continuous Infusions: . 0.9 % NaCl with KCl 40 mEq / L 100 mL/hr (11/19/15 0056)    Principal Problem:   Bacteremia Active Problems:   Essential hypertension, benign   Type 2 diabetes with nephropathy (HCC)   Chronic diastolic heart failure (HCC)   Chronic kidney disease (CKD), stage IV (severe) (HCC)   Prolonged Q-T interval on ECG   Loss of weight   Renal mass, right   UTI (lower urinary tract infection)   Protein-calorie malnutrition, severe   Elevated LFTs   Cholelithiasis   Choledocholithiasis   Time spent 25 minutes   By signing my name below, I, [name] attest that this documentation has been prepared under the direction and in the presence of Taylor Hodgkins, MD Electronically signed: Rennis Hamilton  11/19/2015 11:35am    I personally performed the services described in this documentation. All medical record entries made by the scribe were at my direction. I have reviewed the chart and agree that the record reflects my personal performance and is accurate and complete. Taylor Hodgkins, MD

## 2015-11-20 DIAGNOSIS — R7881 Bacteremia: Secondary | ICD-10-CM

## 2015-11-20 DIAGNOSIS — K8020 Calculus of gallbladder without cholecystitis without obstruction: Secondary | ICD-10-CM

## 2015-11-20 DIAGNOSIS — R634 Abnormal weight loss: Secondary | ICD-10-CM

## 2015-11-20 DIAGNOSIS — K8050 Calculus of bile duct without cholangitis or cholecystitis without obstruction: Secondary | ICD-10-CM

## 2015-11-20 LAB — CG4 I-STAT (LACTIC ACID): Lactic Acid, Venous: 2.32 mmol/L (ref 0.5–2.0)

## 2015-11-20 LAB — CULTURE, BLOOD (ROUTINE X 2)

## 2015-11-20 LAB — COMPREHENSIVE METABOLIC PANEL
ALBUMIN: 2.6 g/dL — AB (ref 3.5–5.0)
ALT: 54 U/L (ref 14–54)
AST: 38 U/L (ref 15–41)
Alkaline Phosphatase: 145 U/L — ABNORMAL HIGH (ref 38–126)
Anion gap: 6 (ref 5–15)
BUN: 21 mg/dL — AB (ref 6–20)
CHLORIDE: 108 mmol/L (ref 101–111)
CO2: 22 mmol/L (ref 22–32)
CREATININE: 1.04 mg/dL — AB (ref 0.44–1.00)
Calcium: 8.3 mg/dL — ABNORMAL LOW (ref 8.9–10.3)
GFR calc Af Amer: 53 mL/min — ABNORMAL LOW (ref >60)
GFR, EST NON AFRICAN AMERICAN: 46 mL/min — AB (ref >60)
GLUCOSE: 134 mg/dL — AB (ref 65–99)
Potassium: 4.6 mmol/L (ref 3.5–5.1)
Sodium: 136 mmol/L (ref 135–145)
Total Bilirubin: 0.8 mg/dL (ref 0.3–1.2)
Total Protein: 6.2 g/dL — ABNORMAL LOW (ref 6.5–8.1)

## 2015-11-20 LAB — URINE CULTURE

## 2015-11-20 LAB — CBC
HEMATOCRIT: 32.3 % — AB (ref 36.0–46.0)
Hemoglobin: 10.4 g/dL — ABNORMAL LOW (ref 12.0–15.0)
MCH: 28.7 pg (ref 26.0–34.0)
MCHC: 32.2 g/dL (ref 30.0–36.0)
MCV: 89 fL (ref 78.0–100.0)
PLATELETS: 158 10*3/uL (ref 150–400)
RBC: 3.63 MIL/uL — ABNORMAL LOW (ref 3.87–5.11)
RDW: 15.5 % (ref 11.5–15.5)
WBC: 9.6 10*3/uL (ref 4.0–10.5)

## 2015-11-20 LAB — GLUCOSE, CAPILLARY
GLUCOSE-CAPILLARY: 150 mg/dL — AB (ref 65–99)
GLUCOSE-CAPILLARY: 87 mg/dL (ref 65–99)
GLUCOSE-CAPILLARY: 87 mg/dL (ref 65–99)
Glucose-Capillary: 101 mg/dL — ABNORMAL HIGH (ref 65–99)
Glucose-Capillary: 94 mg/dL (ref 65–99)

## 2015-11-20 MED ORDER — TORSEMIDE 20 MG PO TABS
20.0000 mg | ORAL_TABLET | Freq: Every day | ORAL | Status: DC
  Administered 2015-11-21: 20 mg via ORAL
  Filled 2015-11-20: qty 1

## 2015-11-20 MED ORDER — DEXTROSE 5 % IV SOLN
2.0000 g | INTRAVENOUS | Status: DC
  Administered 2015-11-20: 2 g via INTRAVENOUS
  Filled 2015-11-20 (×3): qty 2

## 2015-11-20 NOTE — Progress Notes (Signed)
ANTIBIOTIC CONSULT NOTE  Pharmacy Consult for ROCEPHIN Indication: sepsis /BACTEREMIA  Allergies  Allergen Reactions  . Aleve [Naproxen Sodium]   . Carbapenems Other (See Comments)    unknown  . Cephalosporins Other (See Comments)    Unknown/ pt has had rocephin in the past and tolerated  . Codeine Nausea Only  . Amoxicillin Rash  . Penicillins Rash   Pt has received Rocephin in 2013, Cefprozil in 2014, and PCN po in 2015 per past medical records in [facility].  No problems noted despite current allergies listed.  Discussed with Dr Sarajane Jews.  Patient Measurements: Height: 5\' 5"  (165.1 cm) Weight: 179 lb 7.3 oz (81.4 kg) IBW/kg (Calculated) : 57  Vital Signs: Temp: 97.5 F (36.4 C) (03/20 0619) Temp Source: Oral (03/20 0619) BP: 165/76 mmHg (03/20 0619) Pulse Rate: 87 (03/20 0619)  Labs:  Recent Labs  11/17/15 2315 11/18/15 0702 11/19/15 0704 11/20/15 0920  WBC 28.7* 19.1* 13.0* 9.6  HGB 12.3 11.1* 10.9* 10.4*  PLT 185 162 152 158  CREATININE 1.87*  --  1.09* 1.04*   Estimated Creatinine Clearance: 37.2 mL/min (by C-G formula based on Cr of 1.04).  No results for input(s): VANCOTROUGH, VANCOPEAK, VANCORANDOM, GENTTROUGH, GENTPEAK, GENTRANDOM, TOBRATROUGH, TOBRAPEAK, TOBRARND, AMIKACINPEAK, AMIKACINTROU, AMIKACIN in the last 72 hours.   Microbiology: Recent Results (from the past 720 hour(s))  Culture, Urine     Status: None   Collection Time: 11/17/15  7:10 PM  Result Value Ref Range Status   Specimen Description URINE, CATHETERIZED  Final   Special Requests NONE  Final   Culture   Final    >=100,000 COLONIES/mL ESCHERICHIA COLI SUSCEPTIBILITIES PERFORMED ON PREVIOUS CULTURE WITHIN THE LAST 5 DAYS. Performed at [facility]    Report Status 11/20/2015 FINAL  Final  Blood Culture (routine x 2)     Status: None   Collection Time: 11/17/15 11:15 PM  Result Value Ref Range Status   Specimen Description BLOOD LEFT HAND  Final   Special Requests BOTTLES DRAWN  AEROBIC AND ANAEROBIC 6CC EACH  Final   Culture  Setup Time   Final    GRAM NEGATIVE RODS IN BOTH AEROBIC AND ANAEROBIC BOTTLES CRITICAL RESULT CALLED TO, READ BACK BY AND VERIFIED WITH: GIBSON, K. AT 1606 ON03/18/2017 BY AGUNDIZ,E.    Culture   Final    ESCHERICHIA COLI Performed at [facility]    Report Status 11/20/2015 FINAL  Final   Organism ID, Bacteria ESCHERICHIA COLI  Final      Susceptibility   Escherichia coli - MIC*    AMPICILLIN >=32 RESISTANT Resistant     CEFAZOLIN 8 SENSITIVE Sensitive     CEFEPIME <=1 SENSITIVE Sensitive     CEFTAZIDIME <=1 SENSITIVE Sensitive     CEFTRIAXONE <=1 SENSITIVE Sensitive     CIPROFLOXACIN 2 INTERMEDIATE Intermediate     GENTAMICIN <=1 SENSITIVE Sensitive     IMIPENEM <=0.25 SENSITIVE Sensitive     TRIMETH/SULFA >=320 RESISTANT Resistant     AMPICILLIN/SULBACTAM >=32 RESISTANT Resistant     PIP/TAZO <=4 SENSITIVE Sensitive     * ESCHERICHIA COLI  Urine culture     Status: None   Collection Time: 11/17/15 11:50 PM  Result Value Ref Range Status   Specimen Description URINE, CATHETERIZED  Final   Special Requests NONE  Final   Culture   Final    >=100,000 COLONIES/mL ESCHERICHIA COLI Performed at [facility]    Report Status 11/20/2015 FINAL  Final   Organism ID,  Bacteria ESCHERICHIA COLI  Final      Susceptibility   Escherichia coli - MIC*    AMPICILLIN 16 INTERMEDIATE Intermediate     CEFAZOLIN <=4 SENSITIVE Sensitive     CEFTRIAXONE <=1 SENSITIVE Sensitive     CIPROFLOXACIN >=4 RESISTANT Resistant     GENTAMICIN <=1 SENSITIVE Sensitive     IMIPENEM <=0.25 SENSITIVE Sensitive     NITROFURANTOIN <=16 SENSITIVE Sensitive     TRIMETH/SULFA >=320 RESISTANT Resistant     AMPICILLIN/SULBACTAM 4 INTERMEDIATE Intermediate     PIP/TAZO <=4 SENSITIVE Sensitive     * >=100,000 COLONIES/mL ESCHERICHIA COLI  Blood Culture (routine x 2)     Status: None (Preliminary result)   Collection Time: 11/17/15 11:54 PM  Result  Value Ref Range Status   Specimen Description RIGHT ANTECUBITAL  Final   Special Requests BOTTLES DRAWN AEROBIC AND ANAEROBIC 6CC EACH  Final   Culture  Setup Time   Final    GRAM NEGATIVE RODS AEROBIC BOTTLE ONLY CRITICAL RESULT CALLED TO, READ BACK BY AND VERIFIED WITH: BULLINS, L. AT 1851 ON 11/18/2015 BY AGUNDIZ, E.    Culture   Final    GRAM NEGATIVE RODS CULTURE REINCUBATED FOR BETTER GROWTH Performed at [facility]    Report Status PENDING  Incomplete  MRSA PCR Screening     Status: Abnormal   Collection Time: 11/18/15  5:12 AM  Result Value Ref Range Status   MRSA by PCR POSITIVE (A) NEGATIVE Final    Comment:        The GeneXpert MRSA Assay (FDA approved for NASAL specimens only), is one component of a comprehensive MRSA colonization surveillance program. It is not intended to diagnose MRSA infection nor to guide or monitor treatment for MRSA infections. RESULT CALLED TO, READ BACK BY AND VERIFIED WITH: BULLINS L. AT 1014A ON [id number] BY THOMPSON S.    Medical History: Past Medical History  Diagnosis Date  . Breast cancer ([place])   . Essential hypertension, benign   . Depression   . GERD (gastroesophageal reflux disease)   . Hypercholesteremia   . CKD (chronic kidney disease) stage 3, GFR 30-59 ml/min   . Pneumonia   . UTI (lower urinary tract infection)   . Arthritis   . Asthma   . Venous stasis   . Type 2 diabetes mellitus ([place])   . Chronic diastolic heart failure (HCC)     LVEF 70%  . Mitral stenosis     Moderate  . Secondary pulmonary hypertension (HCC)     PASP 64 mmHg  . CHF (congestive heart failure) ([place])   . Cholelithiasis 11/19/2015  . Choledocholithiasis 11/19/2015   Medications:  Prescriptions prior to admission  Medication Sig Dispense Refill Last Dose  . acetaminophen (TYLENOL) 325 MG tablet Take 650 mg by mouth every 6 (six) hours as needed for mild pain.    unknown  . albuterol (PROVENTIL HFA;VENTOLIN HFA) 108 (90 BASE) MCG/ACT  inhaler Inhale 2 puffs into the lungs every 6 (six) hours as needed for wheezing or shortness of breath.   unknown  . albuterol (PROVENTIL) (2.5 MG/3ML) 0.083% nebulizer solution Take 2.5 mg by nebulization every 6 (six) hours as needed for wheezing or shortness of breath.   11/09/2015  . allopurinol (ZYLOPRIM) 100 MG tablet TAKE (2) TABLETS BY MOUTH ONCE DAILY. 60 tablet 5 11/17/2015 at Unknown time  . alum & mag hydroxide-simeth (MYLANTA) 200-200-20 MG/5ML suspension Take by mouth every 4 (four) hours as needed for indigestion or  heartburn.   unknown  . amLODipine (NORVASC) 10 MG tablet Take 10 mg by mouth daily.   11/17/2015 at Unknown time  . aspirin 325 MG tablet Take 1 tablet (325 mg total) by mouth daily. 30 tablet 0 11/17/2015 at 0900  . benzonatate (TESSALON) 100 MG capsule Take 1 capsule (100 mg total) by mouth 3 (three) times daily as needed for cough. 20 capsule 0 unknown  . Cranberry 475 MG CAPS Take 1 capsule by mouth every 12 (twelve) hours.   11/17/2015 at Unknown time  . Cranberry-Vitamin C-Inulin (UTI-STAT) LIQD Take 30 mLs by mouth daily.   11/17/2015 at Unknown time  . diphenhydrAMINE (BENADRYL) 25 MG tablet Take 12.5 mg by mouth every 8 (eight) hours as needed for itching.   11/14/2015  . divalproex (DEPAKOTE SPRINKLE) 125 MG capsule Take 125 mg by mouth 2 (two) times daily.   11/17/2015 at Unknown time  . ENSURE (ENSURE) Take 237 mLs by mouth 2 (two) times daily between meals. Please mix with ice cream.   unknown at Unknown time  . guaiFENesin (MUCINEX) 600 MG 12 hr tablet Take 1 tablet (600 mg total) by mouth 2 (two) times daily.   11/17/2015 at Unknown time  . guaifenesin (TUSSIN) 100 MG/5ML syrup Take 200 mg by mouth 2 (two) times daily as needed for cough.    11/05/2015  . levofloxacin (LEVAQUIN) 500 MG tablet Take 500 mg by mouth daily.   11/17/2015 at Unknown time  . magnesium hydroxide (MILK OF MAGNESIA) 400 MG/5ML suspension Take 30 mLs by mouth daily as needed for mild constipation.    unknown  . metoprolol tartrate (LOPRESSOR) 25 MG tablet Take 12.5 mg by mouth 2 (two) times daily. Take one-half tab equal 12.5 mg bid   11/17/2015 at 2100  . PARoxetine (PAXIL) 30 MG tablet Take 30 mg by mouth daily.   11/17/2015 at Unknown time  . phenol (CHLORASEPTIC) 1.4 % LIQD Use as directed 2 sprays in the mouth or throat as needed for throat irritation / pain.   unknown  . Polyethyl Glycol-Propyl Glycol (SYSTANE OP) Place 1 drop into both eyes 2 (two) times daily.   11/17/2015 at Unknown time  . potassium chloride SA (K-DUR,KLOR-CON) 20 MEQ tablet Take 40 mEq by mouth 2 (two) times daily.    11/17/2015 at Unknown time  . ranitidine (ZANTAC) 150 MG tablet Take 150 mg by mouth at bedtime.   11/17/2015 at Unknown time  . torsemide (DEMADEX) 20 MG tablet Take 80 mg by mouth 2 (two) times daily. Takes  80 mg every morning 60 mg every afternoon   11/17/2015 at Unknown time  . insulin glargine (LANTUS) 100 UNIT/ML injection Inject 5 Units into the skin at bedtime.   11/16/2015  . insulin lispro (HUMALOG) 100 UNIT/ML injection Inject 2-6 Units into the skin 3 (three) times daily before meals. Sliding scale as follows: 200-250=2units 251-300=4units 301-350=6units If CBG greater than 350 give 6 units and call Provider   11/13/2014 at Unknown time  . Spacer/Aero-Holding Chambers (AEROCHAMBER MV) inhaler Use as instructed. 2 puffs BID (Patient not taking: Reported on 11/14/2014) 1 each 0    Scheduled:  . allopurinol  150 mg Oral Daily  . aspirin  325 mg Oral Daily  . cefTRIAXone (ROCEPHIN)  IV  2 g Intravenous Q24H  . divalproex  125 mg Oral BID  . famotidine  20 mg Oral BID  . feeding supplement (ENSURE ENLIVE)  237 mL Oral BID BM  . guaiFENesin  600 mg Oral BID  . heparin  5,000 Units Subcutaneous 3 times per day  . insulin aspart  0-15 Units Subcutaneous 6 times per day  . PARoxetine  30 mg Oral Daily  . polyvinyl alcohol  1 drop Both Eyes BID  . potassium chloride SA  40 mEq Oral BID  .  simvastatin  10 mg Oral QHS  . sodium chloride flush  3 mL Intravenous Q12H   Infusions:  . 0.9 % NaCl with KCl 40 mEq / L 100 mL/hr (11/19/15 2340)   Assessment: 82 yo admitted from [facility] center for elevated white count and increased confusion.  Blood cx's and Ucx reported as (+).  Cultures listed above.  D/W Dr Sarajane Jews, allergies noted but pt has received PCN and cephalosporins in 2013-2015 with no problems noted.  Goal of Therapy:  Eradicate infection.  Plan:   Rocephin 2gm IV q24hrs  F/u cultures, progress and clinical course  Thanks for allowing pharmacy to be a part of this patient's care.  Hart Robinsons, PharmD Clinical Pharmacist 11/20/2015,1:19 PM

## 2015-11-20 NOTE — Clinical Social Work Note (Signed)
Clinical Social Work Assessment  Patient Details  Name: [name] MRN: [id number] Date of Birth: [birth year]  Date of referral:  11/20/15               Reason for consult:  Other (Comment Required) (From [facility])                Permission sought to share information with:  Case Manager, Customer service manager, Family Supports Permission granted to share information::  Yes, Verbal Permission Granted  Name::        Agency::  [facility]  Relationship::  Electrical engineer (patient daughter at bedside)  Contact Information:     Housing/Transportation Living arrangements for the past 2 months:  [facility] (LTC) Source of Information:  Medical Team, Adult Children Patient Interpreter Needed:  None Criminal Activity/Legal Involvement Pertinent to Current Situation/Hospitalization:  No - Comment as needed Significant Relationships:  Adult Children, Warehouse manager, Other Family Members Lives with:  Facility Resident Do you feel safe going back to the place where you live?  Yes Need for family participation in patient care:  Yes (Comment)  Care giving concerns:  No care giving concerns per daughter. Reports patient is a permanent resident at facility and identifies no safety concerns or problems.   Social Worker assessment / plan:  LCSW received consult that patient is from SNF: [facility]. Daughter at bedside and completes assessment with LCSW.  Daughter reports patient will return at DC.  No barriers to plan. Call placed to facility and sent clinicals for review.  Will facilitate DC  Once patient is medically stable.  No other needs identified   Employment status:  Retired Nurse, adult PT Recommendations:  Not assessed at this time Information / Referral to community resources:  Other (Comment Required) (none reported needed at this time.)  Patient/Family's Response to care:  Agreeable to plan  Patient/Family's Understanding of  and Emotional Response to Diagnosis, Current Treatment, and Prognosis:  Daughter agreeable with plan and aware of prognosis and understanding for medical treatment.   Emotional Assessment Appearance:  Appears stated age Attitude/Demeanor/Rapport:  Other (sleeping in room, met with daughter at bedside) Affect (typically observed):  Quiet, Unable to Assess Orientation:  Oriented to Self Alcohol / Substance use:  Not Applicable Psych involvement (Current and /or in the community):  No (Comment)  Discharge Needs  Concerns to be addressed:  No discharge needs identified Readmission within the last 30 days:  No Current discharge risk:  None Barriers to Discharge:  No Barriers Identified, Continued Medical Work up (Return to SNF when medically stable)   Lilly [place], LCSW 11/20/2015, 10:53 AM

## 2015-11-20 NOTE — NC FL2 (Signed)
[place] LEVEL OF CARE SCREENING TOOL     IDENTIFICATION  Patient Name: [name] Birthdate: 05-Nov-1921 Sex: female Admission Date (Current Location): 11/17/2015  [facility] and [place] Number:  [employer] and Address:  [place] [address], [place]      Provider Number: [id number]  Attending Physician Name and Address:  Samuella Cota, MD  Relative Name and Phone Number:       Current Level of Care: Hospital Recommended Level of Care: Nursing Facility Prior Approval Number:    Date Approved/Denied:   PASRR Number:    Discharge Plan: SNF    Current Diagnoses: Patient Active Problem List   Diagnosis Date Noted  . Cholelithiasis 11/19/2015  . Choledocholithiasis 11/19/2015  . Bacteremia 11/19/2015  . Prolonged Q-T interval on ECG 11/18/2015  . Loss of weight 11/18/2015  . Renal mass, right 11/18/2015  . Protein-calorie malnutrition, severe 11/18/2015  . Elevated LFTs 11/18/2015  . UTI (lower urinary tract infection)   . Cough 06/18/2015  . Itching 05/24/2015  . Itching in the vaginal area 05/19/2015  . FTT (failure to thrive) in adult 04/26/2015  . Right ear impacted cerumen 12/23/2014  . Allergic rhinitis 12/18/2014  . Encephalopathy 11/13/2014  . Hypokalemia 10/28/2014  . Weight gain 07/21/2014  . Edema 05/22/2014  . Chronic kidney disease (CKD), stage IV (severe) ([place]) 04/16/2014  . COPD exacerbation ([place]) 04/15/2014  . Acute on chronic diastolic CHF (congestive heart failure) ([place]) 04/15/2014  . Acute renal insufficiency 04/15/2014  . Acute respiratory failure with hypoxia ([place]) 04/15/2014  . Secondary renovascular hypertension, benign 04/14/2014  . Mitral stenosis 02/03/2014  . Urinary frequency 02/03/2014  . Hip pain 02/03/2014  . Abnormality of gait 12/22/2013  . Chronic diastolic heart failure ([place]) 12/10/2013  . Dysphagia, pharyngoesophageal phase 10/03/2013  . Stress  incontinence 09/06/2013  . Type 2 diabetes with nephropathy ([place]) 03/28/2013  . Asthma, chronic 03/28/2013  . Arthritis 12/11/2011  . Essential hypertension, benign 12/11/2011  . Dyslipidemia 12/11/2011    Orientation RESPIRATION BLADDER Height & Weight     Self  Normal Incontinent Weight: 179 lb 7.3 oz (81.4 kg) Height:  5\' 5"  (165.1 cm)  BEHAVIORAL SYMPTOMS/MOOD NEUROLOGICAL BOWEL NUTRITION STATUS  Other (Comment) (none)   Incontinent Diet (heart healthy)  AMBULATORY STATUS COMMUNICATION OF NEEDS Skin   Extensive Assist Verbally Normal                       Personal Care Assistance Level of Assistance  Bathing, Feeding, Dressing Bathing Assistance: Limited assistance Feeding assistance: Limited assistance Dressing Assistance: Limited assistance     Functional Limitations Info  Sight, Hearing, Speech Sight Info: Impaired Hearing Info: Impaired Speech Info: Adequate    SPECIAL CARE FACTORS FREQUENCY                       Contractures Contractures Info: Not present    Additional Factors Info  Code Status, Allergies, Psychotropic, Insulin Sliding Scale, Isolation Precautions Code Status Info: DNR Allergies Info: Aleve, Carbapenems, Cephalosporins, Codeine, Amoxicillin, Penicillins Psychotropic Info: Paxil, Depakote Insulin Sliding Scale Info: 6x Isolation Precautions Info: MRSA, on contact percautions     Current Medications (11/20/2015):  This is the current hospital active medication list Current Facility-Administered Medications  Medication Dose Route Frequency Provider Last Rate Last Dose  . 0.9 % NaCl with KCl 40 mEq / L  infusion   Intravenous Continuous Christia Reading  S Opyd, MD 100 mL/hr at 11/19/15 2340 100 mL/hr at 11/19/15 2340  . acetaminophen (TYLENOL) tablet 650 mg  650 mg Oral Q6H PRN Vianne Bulls, MD   650 mg at 11/19/15 2343  . allopurinol (ZYLOPRIM) tablet 150 mg  150 mg Oral Daily Vianne Bulls, MD   150 mg at 11/19/15 0852  . alum & mag  hydroxide-simeth (MAALOX/MYLANTA) 200-200-20 MG/5ML suspension 30 mL  30 mL Oral Q4H PRN Vianne Bulls, MD      . aspirin tablet 325 mg  325 mg Oral Daily Vianne Bulls, MD   325 mg at 11/19/15 0852  . aztreonam (AZACTAM) 1 g in dextrose 5 % 50 mL IVPB  1 g Intravenous 3 times per day Samuella Cota, MD   1 g at 11/20/15 0603  . benzonatate (TESSALON) capsule 100 mg  100 mg Oral TID PRN Vianne Bulls, MD      . divalproex (DEPAKOTE) DR tablet 125 mg  125 mg Oral BID Vianne Bulls, MD   125 mg at 11/19/15 2341  . famotidine (PEPCID) tablet 20 mg  20 mg Oral BID Vianne Bulls, MD   20 mg at 11/19/15 2342  . feeding supplement (ENSURE ENLIVE) (ENSURE ENLIVE) liquid 237 mL  237 mL Oral BID BM Ilene Qua Opyd, MD   237 mL at 11/19/15 1000  . guaiFENesin (MUCINEX) 12 hr tablet 600 mg  600 mg Oral BID Ilene Qua Opyd, MD   600 mg at 11/19/15 2343  . heparin injection 5,000 Units  5,000 Units Subcutaneous 3 times per day Vianne Bulls, MD   5,000 Units at 11/20/15 0603  . insulin aspart (novoLOG) injection 0-15 Units  0-15 Units Subcutaneous 6 times per day Vianne Bulls, MD   2 Units at 11/20/15 0900  . ipratropium-albuterol (DUONEB) 0.5-2.5 (3) MG/3ML nebulizer solution 3 mL  3 mL Nebulization Q4H PRN Ilene Qua Opyd, MD      . magnesium hydroxide (MILK OF MAGNESIA) suspension 30 mL  30 mL Oral Daily PRN Ilene Qua Opyd, MD      . ondansetron (ZOFRAN) tablet 4 mg  4 mg Oral Q6H PRN Vianne Bulls, MD       Or  . ondansetron (ZOFRAN) injection 4 mg  4 mg Intravenous Q6H PRN Vianne Bulls, MD   4 mg at 11/19/15 1800  . PARoxetine (PAXIL) tablet 30 mg  30 mg Oral Daily Vianne Bulls, MD   30 mg at 11/19/15 0841  . polyethylene glycol (MIRALAX / GLYCOLAX) packet 17 g  17 g Oral Daily PRN Vianne Bulls, MD      . polyvinyl alcohol (LIQUIFILM TEARS) 1.4 % ophthalmic solution 1 drop  1 drop Both Eyes BID Samuella Cota, MD   1 drop at 11/19/15 2200  . potassium chloride SA (K-DUR,KLOR-CON) CR tablet  40 mEq  40 mEq Oral BID Vianne Bulls, MD   40 mEq at 11/19/15 2342  . simvastatin (ZOCOR) tablet 10 mg  10 mg Oral QHS Vianne Bulls, MD   10 mg at 11/19/15 2342  . sodium chloride flush (NS) 0.9 % injection 3 mL  3 mL Intravenous Q12H Vianne Bulls, MD   3 mL at 11/19/15 [id number]     Discharge Medications: Please see discharge summary for a list of discharge medications.  Relevant Imaging Results:  Relevant Lab Results:   Additional Information    Lilly [place], LCSW

## 2015-11-20 NOTE — Discharge Summary (Signed)
Physician Discharge Summary  [name] [id number] DOB: [birth year] DOA: 11/17/2015  PCP: Cyndee Brightly, MD  Admit date: 11/17/2015 Discharge date: 11/21/2015  Recommendations for Outpatient Follow-up:   Follow-up resolution of UTI and bacteremia. Reportedly has frequent UTIs. Consider suppressive antibiotic therapy in the future. Defer to PCP.  Continue Ceftin until 3/27  Note renal mass discussion below.  Note discussion regarding cholelithiasis as below.  Note that the patient has a listed allergy to penicillin and carbapenems and cephalosporins however she has tolerated multiple cephalosporins in the past per pharmacy review and is currently tolerating a cephalosporin.   Follow-up Information    Follow up with Cyndee Brightly, MD In 1 week.   Specialty:  Internal Medicine   Contact information:   [place] [address] [phone]      Discharge Diagnoses:  1. E coli bacteremia secondary to UTI 2. E coli UTI 3. Acute kidney injury superimposed on CKD stage III 4. Elevated LFTs, suspected choledocholithiasis 5. Cholelithiasis 6. Elevated troponin. 7. Acute encephalopathy superimposed on dementia with behavioral disturbance.  8. DM type 2 with nephropathy 9. Chronic diastolic CHF 10. Prolonged QT 11. Right renal mass concerning for RCC.  12. Severe malnutrition.  Discharge Condition: Improved Disposition: Discharge to [place] recommendation: Heart healthy   Filed Weights   11/17/15 2249 11/18/15 0437  Weight: 90.719 kg (200 lb) 81.4 kg (179 lb 7.3 oz)    History of present illness:  82 year old woman complex past medical history resident of skilled nursing facility presented with history fever, acute confusion, aspiration, multiple allergies including cephalosporins penicillins amoxicillin, carbopenems. Initial evaluation revealed significant leukocytosis, elevated lactic acid, bilirubin. Consider for sepsis  secondary to UTI.  Hospital Course:  Patient was treated with broad-spectrum antibiotics with rapid clinical improvement. Cultures grew out Escherichia coli with bacteremia secondary to UTI. History of frequent UTI. Of note incidental finding of renal mass, suspicious for renal cell carcinoma. This was discussed in detail with the patient's daughter Fraser Din who is healthcare power of attorney, understands that this likely represents cancer but desires no further evaluation and requests conservative management. Also discussed cholelithiasis with suspected choledocholithiasis. We discussed the potential implications of recurrent obstruction which can result in severe illness and death. Again, daughter wishes conservative management, no further evaluation. Given the patient's advanced age and comorbidities this is reasonable. Antibiotics were narrowed to cephalosporin which the patient tolerated well.   Individual issues as below:  1. E. Coli bacteremia secondary to UTI. Clinically resolved. Afebrile greater than 24 hours. Lactic acid cleared with fluids. CT abd/pelvis no acute features.  2. E. Coli UTI. Stable. History of frequent UTIs. 3. Elevated LFTs, mixed pattern. Asymptomatic, no abdominal pain. Trending downwards. Gallbladder shows cholelithiasis. Suspect transient choledocholithiasis. 4. AKI superimposed on CKD stage IV, resolved 5. Elevated troponin, suspect elevation with AKI. No further evaluation suggested. 6. Acute encephalopathy superimposed on dementia with behavioral disturbance. Acute component resolved. Remains at baseline. 7. COPD, stable 8. DM type 2 with nephropathy, stable. 9. Chronic diastolic CHF, compensated 10. Prolonged QT 11. Right renal mass concerning for RCC. Discussed in detail with daughter. She desires no further investigation. She understands that this likely represents cancer. 12. PMH breast cancer, stroke, pulmonary HTN 13. Weight loss likely multifactorial  including poor oral intake. 14. Severe malnutrition, in the context of chronic illness. Nutrition following.   Consultants:  None  Procedures:  None  Discharge Instructions   Current Discharge Medication List    START  taking these medications   Details  cefUROXime (CEFTIN) 500 MG tablet Take 1 tablet (500 mg total) by mouth 2 (two) times daily with a meal. Last day 3/27.      CONTINUE these medications which have NOT CHANGED   Details  acetaminophen (TYLENOL) 325 MG tablet Take 650 mg by mouth every 6 (six) hours as needed for mild pain.     albuterol (PROVENTIL HFA;VENTOLIN HFA) 108 (90 BASE) MCG/ACT inhaler Inhale 2 puffs into the lungs every 6 (six) hours as needed for wheezing or shortness of breath.    albuterol (PROVENTIL) (2.5 MG/3ML) 0.083% nebulizer solution Take 2.5 mg by nebulization every 6 (six) hours as needed for wheezing or shortness of breath.    allopurinol (ZYLOPRIM) 100 MG tablet TAKE (2) TABLETS BY MOUTH ONCE DAILY. Qty: 60 tablet, Refills: 5    alum & mag hydroxide-simeth (MYLANTA) [id number] MG/5ML suspension Take by mouth every 4 (four) hours as needed for indigestion or heartburn.    amLODipine (NORVASC) 10 MG tablet Take 10 mg by mouth daily.    aspirin 325 MG tablet Take 1 tablet (325 mg total) by mouth daily. Qty: 30 tablet, Refills: 0    Cranberry 475 MG CAPS Take 1 capsule by mouth every 12 (twelve) hours.    Cranberry-Vitamin C-Inulin (UTI-STAT) LIQD Take 30 mLs by mouth daily.    diphenhydrAMINE (BENADRYL) 25 MG tablet Take 12.5 mg by mouth every 8 (eight) hours as needed for itching.    divalproex (DEPAKOTE SPRINKLE) 125 MG capsule Take 125 mg by mouth 2 (two) times daily.    ENSURE (ENSURE) Take 237 mLs by mouth 2 (two) times daily between meals. Please mix with ice cream.    guaiFENesin (MUCINEX) 600 MG 12 hr tablet Take 1 tablet (600 mg total) by mouth 2 (two) times daily.    magnesium hydroxide (MILK OF MAGNESIA) 400 MG/5ML  suspension Take 30 mLs by mouth daily as needed for mild constipation.    metoprolol tartrate (LOPRESSOR) 25 MG tablet Take 12.5 mg by mouth 2 (two) times daily. Take one-half tab equal 12.5 mg bid    PARoxetine (PAXIL) 30 MG tablet Take 30 mg by mouth daily.    phenol (CHLORASEPTIC) 1.4 % LIQD Use as directed 2 sprays in the mouth or throat as needed for throat irritation / pain.    Polyethyl Glycol-Propyl Glycol (SYSTANE OP) Place 1 drop into both eyes 2 (two) times daily.    potassium chloride SA (K-DUR,KLOR-CON) 20 MEQ tablet Take 40 mEq by mouth 2 (two) times daily.     ranitidine (ZANTAC) 150 MG tablet Take 150 mg by mouth at bedtime.    torsemide (DEMADEX) 20 MG tablet Take 80 mg by mouth 2 (two) times daily. Takes  80 mg every morning 60 mg every afternoon    insulin glargine (LANTUS) 100 UNIT/ML injection Inject 5 Units into the skin at bedtime.    insulin lispro (HUMALOG) 100 UNIT/ML injection Inject 2-6 Units into the skin 3 (three) times daily before meals. Sliding scale as follows: 200-250=2units 251-300=4units 301-350=6units If CBG greater than 350 give 6 units and call Provider    Spacer/Aero-Holding Chambers (AEROCHAMBER MV) inhaler Use as instructed. 2 puffs BID Qty: 1 each, Refills: 0      STOP taking these medications     benzonatate (TESSALON) 100 MG capsule      guaifenesin (TUSSIN) 100 MG/5ML syrup      levofloxacin (LEVAQUIN) 500 MG tablet      divalproex (DEPAKOTE)  125 MG DR tablet      ipratropium-albuterol (DUONEB) 0.5-2.5 (3) MG/3ML SOLN      simvastatin (ZOCOR) 10 MG tablet        Allergies  Allergen Reactions  . Aleve [Naproxen Sodium]   . Carbapenems Other (See Comments)    unknown  . Cephalosporins Other (See Comments)    Unknown/ pt has had rocephin in the past and tolerated  . Codeine Nausea Only  . Amoxicillin Rash  . Penicillins Rash    The results of significant diagnostics from this hospitalization (including imaging,  microbiology, ancillary and laboratory) are listed below for reference.    Significant Diagnostic Studies: Ct Abdomen Pelvis Wo Contrast  11/18/2015  CLINICAL DATA:  Acute onset of fever and leukocytosis. Confusion. Left lower quadrant abdominal pain. Initial encounter. EXAM: CT ABDOMEN AND PELVIS WITHOUT CONTRAST TECHNIQUE: Multidetector CT imaging of the abdomen and pelvis was performed following the standard protocol without IV contrast. COMPARISON:  None. FINDINGS: Mild bibasilar atelectasis or scarring is noted. Calcification is noted at the mitral valve. The liver and spleen are unremarkable in appearance. Stones are noted dependently within the gallbladder. The gallbladder is otherwise unremarkable. The pancreas and adrenal glands are unremarkable. There appears be a somewhat complex 4.1 cm mass arising at the lower pole of the right kidney. This is concerning for renal cell carcinoma. A 3.0 cm cyst is noted near the upper pole of the right kidney. Nonspecific perinephric stranding is noted bilaterally. There is no evidence of hydronephrosis. No renal or ureteral stones are identified. No free fluid is identified. The small bowel is unremarkable in appearance. The stomach is within normal limits. No acute vascular abnormalities are seen. Scattered calcification is noted along the abdominal aorta and its branches. The appendix is not well characterized; there is no evidence of appendicitis. Mild diverticulosis is noted along the mid sigmoid colon. The colon is otherwise unremarkable. The bladder is mildly distended and grossly unremarkable. A mildly calcified uterine fibroid is seen. The uterus is otherwise unremarkable. No suspicious adnexal masses are seen. No inguinal lymphadenopathy is seen. No acute osseous abnormalities are identified. The patient's left hip arthroplasty is incompletely imaged but appears grossly unremarkable. Multilevel vacuum phenomenon is noted along the lumbar spine. IMPRESSION:  1. Apparent complex 4.1 cm mass at the lower pole of the right kidney. This is concerning for renal cell carcinoma. Renal ultrasound would be helpful for further evaluation, as deemed clinically appropriate. 2. Cholelithiasis.  Gallbladder otherwise unremarkable. 3. Mild bibasilar atelectasis or scarring noted. 4. Calcification noted at the mitral valve. 5. Right renal cyst noted. 6. Scattered calcification along the abdominal aorta and its branches. 7. Mild diverticulosis along the mid sigmoid colon. 8. Mildly calcified uterine fibroid noted. 9. Mild degenerative change along the lumbar spine. Electronically Signed   By: Garald Balding M.D.   On: 11/18/2015 02:47   Dg Chest 2 View  11/17/2015  CLINICAL DATA:  Fever.  Altered mental status. EXAM: CHEST  2 VIEW COMPARISON:  09/05/2015. FINDINGS: Stable enlarged cardiac silhouette. Interval small amount of linear atelectasis in the left lower lung zone. Mild diffuse peribronchial thickening without significant change. Stable calcified mediastinal and right hilar lymph nodes. Thoracic spine degenerative changes. Left axillary surgical clips. IMPRESSION: 1. No acute abnormality. 2. Stable cardiomegaly and chronic bronchitic changes. Electronically Signed   By: Claudie Revering M.D.   On: 11/17/2015 20:08   US Renal  11/18/2015  CLINICAL DATA:  Right renal mass seen on CT scan. EXAM:  RENAL / URINARY TRACT ULTRASOUND COMPLETE COMPARISON:  CT scan from earlier today FINDINGS: Right Kidney: Length: 10.9 cm. There is a complex, apparently solid mass off the medial lower right kidney also seen on today's CT scan. This mass measures 5 x 4 x 4.3 cm. A 4 cm cyst is seen in the upper pole. No hydronephrosis. Left Kidney: Length: 10.3 cm.  No suspicious masses or hydronephrosis. Bladder: The bladder is not well assessed due to poor distention. IMPRESSION: 1. The lower pole right renal mass seen on recent CT imaging is heterogeneous in appearance today and appears to have  internal blood flow. This is highly suspicious for a renal cell carcinoma. A contrast-enhanced MRI or CT would be the most definitive imaging studies for complete evaluation. Electronically Signed   By: Dorise Bullion III M.D   On: 11/18/2015 12:17   Dg Chest Port 1 View  11/17/2015  CLINICAL DATA:  Acute onset of fever and shortness of breath. Code sepsis. Initial encounter. EXAM: PORTABLE CHEST 1 VIEW COMPARISON:  Chest radiograph performed earlier today at 7:48 p.m. FINDINGS: The lungs are mildly hypoexpanded. Mild peribronchial thickening is noted, with minimal bilateral atelectasis. There is no evidence of pleural effusion or pneumothorax. The cardiomediastinal silhouette is within normal limits. No acute osseous abnormalities are seen. Mild degenerative change is noted at the glenohumeral joints bilaterally. Clips are seen overlying the left axilla. IMPRESSION: Lungs mildly hypoexpanded. Mild chronic peribronchial thickening, with minimal bilateral atelectasis. Electronically Signed   By: Garald Balding M.D.   On: 11/17/2015 23:55   US Abdomen Limited Ruq  11/19/2015  CLINICAL DATA:  Elevated liver enzymes.  History of breast carcinoma EXAM: US ABDOMEN LIMITED - RIGHT UPPER QUADRANT COMPARISON:  CT abdomen and pelvis November 18, 2015 FINDINGS: Gallbladder: Within the gallbladder, there are multiple echogenic foci which move and shadow consistent with gallstones. Largest gallstone measures approximately 1 cm in length. There is no gallbladder wall thickening or pericholecystic fluid. No sonographic Murphy sign noted by sonographer. Common bile duct: Diameter: 4 mm. There is no intrahepatic or extrahepatic biliary duct dilatation. Liver: No focal lesion identified. Within normal limits in parenchymal echogenicity. IMPRESSION: Cholelithiasis.  Study otherwise unremarkable. Electronically Signed   By: Lowella Grip III M.D.   On: 11/19/2015 11:49    Microbiology: Recent Results (from the past 240  hour(s))  Culture, Urine     Status: None   Collection Time: 11/17/15  7:10 PM  Result Value Ref Range Status   Specimen Description URINE, CATHETERIZED  Final   Special Requests NONE  Final   Culture   Final    >=100,000 COLONIES/mL ESCHERICHIA COLI SUSCEPTIBILITIES PERFORMED ON PREVIOUS CULTURE WITHIN THE LAST 5 DAYS. Performed at [facility]    Report Status 11/20/2015 FINAL  Final  Blood Culture (routine x 2)     Status: None   Collection Time: 11/17/15 11:15 PM  Result Value Ref Range Status   Specimen Description BLOOD LEFT HAND  Final   Special Requests BOTTLES DRAWN AEROBIC AND ANAEROBIC [facility] EACH  Final   Culture  Setup Time   Final    GRAM NEGATIVE RODS IN BOTH AEROBIC AND ANAEROBIC BOTTLES CRITICAL RESULT CALLED TO, READ BACK BY AND VERIFIED WITH: GIBSON, K. AT 1606 ON03/18/2017 BY AGUNDIZ,E.    Culture   Final    ESCHERICHIA COLI Performed at [facility]    Report Status 11/20/2015 FINAL  Final   Organism ID, Bacteria ESCHERICHIA COLI  Final  Susceptibility   Escherichia coli - MIC*    AMPICILLIN >=32 RESISTANT Resistant     CEFAZOLIN 8 SENSITIVE Sensitive     CEFEPIME <=1 SENSITIVE Sensitive     CEFTAZIDIME <=1 SENSITIVE Sensitive     CEFTRIAXONE <=1 SENSITIVE Sensitive     CIPROFLOXACIN 2 INTERMEDIATE Intermediate     GENTAMICIN <=1 SENSITIVE Sensitive     IMIPENEM <=0.25 SENSITIVE Sensitive     TRIMETH/SULFA >=320 RESISTANT Resistant     AMPICILLIN/SULBACTAM >=32 RESISTANT Resistant     PIP/TAZO <=4 SENSITIVE Sensitive     * ESCHERICHIA COLI  Urine culture     Status: None   Collection Time: 11/17/15 11:50 PM  Result Value Ref Range Status   Specimen Description URINE, CATHETERIZED  Final   Special Requests NONE  Final   Culture   Final    >=100,000 COLONIES/mL ESCHERICHIA COLI Performed at [facility]    Report Status 11/20/2015 FINAL  Final   Organism ID, Bacteria ESCHERICHIA COLI  Final      Susceptibility    Escherichia coli - MIC*    AMPICILLIN 16 INTERMEDIATE Intermediate     CEFAZOLIN <=4 SENSITIVE Sensitive     CEFTRIAXONE <=1 SENSITIVE Sensitive     CIPROFLOXACIN >=4 RESISTANT Resistant     GENTAMICIN <=1 SENSITIVE Sensitive     IMIPENEM <=0.25 SENSITIVE Sensitive     NITROFURANTOIN <=16 SENSITIVE Sensitive     TRIMETH/SULFA >=320 RESISTANT Resistant     AMPICILLIN/SULBACTAM 4 INTERMEDIATE Intermediate     PIP/TAZO <=4 SENSITIVE Sensitive     * >=100,000 COLONIES/mL ESCHERICHIA COLI  Blood Culture (routine x 2)     Status: None   Collection Time: 11/17/15 11:54 PM  Result Value Ref Range Status   Specimen Description RIGHT ANTECUBITAL  Final   Special Requests BOTTLES DRAWN AEROBIC AND ANAEROBIC 6CC EACH  Final   Culture  Setup Time   Final    GRAM NEGATIVE RODS AEROBIC BOTTLE ONLY CRITICAL RESULT CALLED TO, READ BACK BY AND VERIFIED WITH: BULLINS, L. AT 1851 ON 11/18/2015 BY AGUNDIZ, E.    Culture   Final    ESCHERICHIA COLI SUSCEPTIBILITIES PERFORMED ON PREVIOUS CULTURE WITHIN THE LAST 5 DAYS. Performed at [facility]    Report Status 11/20/2015 FINAL  Final  MRSA PCR Screening     Status: Abnormal   Collection Time: 11/18/15  5:12 AM  Result Value Ref Range Status   MRSA by PCR POSITIVE (A) NEGATIVE Final    Comment:        The GeneXpert MRSA Assay (FDA approved for NASAL specimens only), is one component of a comprehensive MRSA colonization surveillance program. It is not intended to diagnose MRSA infection nor to guide or monitor treatment for MRSA infections. RESULT CALLED TO, READ BACK BY AND VERIFIED WITH: BULLINS L. AT 1014A ON [id number] BY THOMPSON S.      Labs: Basic Metabolic Panel:  Recent Labs Lab 11/17/15 1910 11/17/15 2315 11/18/15 0702 11/19/15 0704 11/20/15 0920  NA 139 141  --  138 136  K 3.4* 3.9  --  4.2 4.6  CL 103 104  --  109 108  CO2 24 24  --  22 22  GLUCOSE 178* 184*  --  157* 134*  BUN 35* 39*  --  24* 21*  CREATININE  1.48* 1.87*  --  1.09* 1.04*  CALCIUM 8.7* 9.0  --  8.4* 8.3*  MG  --   --  1.5*  --   --  Liver Function Tests:  Recent Labs Lab 11/17/15 1910 11/17/15 2315 11/19/15 0704 11/20/15 0920  AST 216* 194* 61* 38  ALT 140* 136* 74* 54  ALKPHOS 135* 139* 138* 145*  BILITOT 2.7* 2.6* 1.6* 0.8  PROT 7.6 7.2 6.4* 6.2*  ALBUMIN 3.5 3.3* 2.8* 2.6*    Recent Labs Lab 11/18/15 0702  LIPASE 17   CBC:  Recent Labs Lab 11/17/15 1910 11/17/15 2315 11/18/15 0702 11/19/15 0704 11/20/15 0920  WBC 32.7* 28.7* 19.1* 13.0* 9.6  NEUTROABS 29.8* 25.4* 16.5*  --   --   HGB 12.7 12.3 11.1* 10.9* 10.4*  HCT 39.3 38.1 34.5* 33.3* 32.3*  MCV 88.7 88.0 88.7 87.9 89.0  PLT 202 185 162 152 158   Cardiac Enzymes:  Recent Labs Lab 11/17/15 2315 11/18/15 0702  TROPONINI 0.06* 0.05*    CBG:  Recent Labs Lab 11/20/15 1644 11/20/15 2025 11/21/15 0010 11/21/15 0449 11/21/15 0745  GLUCAP 94 87 90 93 104*    Principal Problem:   Bacteremia Active Problems:   Essential hypertension, benign   Type 2 diabetes with nephropathy (HCC)   Chronic diastolic heart failure (HCC)   Chronic kidney disease (CKD), stage IV (severe) (HCC)   Prolonged Q-T interval on ECG   Loss of weight   Renal mass, right   UTI (lower urinary tract infection)   Protein-calorie malnutrition, severe   Elevated LFTs   Cholelithiasis   Choledocholithiasis   Time coordinating discharge: 35 minutes  Signed:  Murray Hodgkins, MD [facility] 11/21/2015, 9:55 AM    By signing my name below, I, Rennis Harding attest that this documentation has been prepared under the direction and in the presence of Murray Hodgkins, MD Electronically signed: Rennis Harding  11/21/2015 9:35am   I personally performed the services described in this documentation. All medical record entries made by the scribe were at my direction. I have reviewed the chart and agree that the record reflects my personal performance  and is accurate and complete. Murray Hodgkins, MD

## 2015-11-20 NOTE — Progress Notes (Signed)
PROGRESS NOTE  [name] [id number] DOB: 1922/01/22 DOA: 11/17/2015 PCP: Cyndee Brightly, MD  Summary: 82 year old woman complex past medical history resident of skilled nursing facility presented with history fever, acute confusion, aspiration, multiple allergies including cephalosporins penicillins amoxicillin, carbopenems. Initial evaluation revealed significant leukocytosis, elevated lactic acid, bilirubin. Consider for sepsis secondary to UTI.  Assessment/Plan: 1. E. Coli bacteremia secondary to UTI. Lactic acid cleared with fluids. CT abd/pelvis no acute features. CXR no acute disease.  2. E. Coli UTI. Daughter bedside reports frequent UTIs. 3. Elevated LFTs, mixed pattern. Asymptomatic, no abdominal pain. Trending downwards. Gallbladder shows cholelithiasis. Suspect transient choledocholithiasis. 4. AKI superimposed on CKD stage IV, resolved 5. Elevated troponin, suspect elevation with AKI. No further evaluation suggested. 6. Acute encephalopathy superimposed on dementia with behavioral disturbance. Acute component resolved. At baseline. 7. COPD, remains stable 8. DM type 2 with nephropathy, stable. 9. Chronic diastolic CHF, compensated 10. Prolonged QT 11. Right renal mass concerning for RCC. Discussed in detail with daughter. She desires no further investigation. She understands that this likely represents cancer. 12. PMH breast cancer, stroke, pulmonary HTN 13. Weight loss likely multifactorial including poor oral intake. 14. Severe malnutrition, in the context of chronic illness. Nutrition following.    Appears much improved. Alert and conversing today.  Will transition to oral abx. Discussed with pharmacy, the patient has tolerated cephalosporins in the past. Change to Rocephin.  Discussed in detail with daughter the results of the gallbladder ultrasound and suspicion for choledocholithiasis. We discussed that this can recur and cholangitis can develop  which can be deadly. She understands this but desires no further investigation or surgery.  Code Status: DNR DVT prophylaxis: Heparin Family Communication: Daughter, Pat bedside. Disposition Plan: Likely return to skilled nursing facility 3/21  Murray Hodgkins, MD  [facility]  Direct contact number: see [email or web address]; password [email or web address] If 7PM-7AM, please contact night-coverage at [email or web address] 11/20/2015, 8:16 AM  LOS: 2 days   Consultants:  None  Procedures:  None  Antibiotics:  Aztreonam 3/18>> 3/20  vancomycin 3/18 >>3/20  HPI/Subjective: Feeling better. No reports of chest pain, shortness of breath, n/v/d, or abd pain. Per daughter she is improving. She ate a small breakfast.   Objective: Filed Vitals:   11/19/15 1456 11/19/15 2051 11/19/15 2159 11/20/15 0619  BP:  157/72 143/59 165/76  Pulse:  95 87 87  Temp:  99.2 F (37.3 C) 98.7 F (37.1 C) 97.5 F (36.4 C)  TempSrc:  Oral Oral Oral  Resp:  18 18 18   Height:      Weight:      SpO2: 94% 98% 96% 95%    Intake/Output Summary (Last 24 hours) at 11/20/15 0816 Last data filed at 11/19/15 1435  Gross per 24 hour  Intake    123 ml  Output      0 ml  Net    123 ml     Filed Weights   11/17/15 2249 11/18/15 0437  Weight: 90.719 kg (200 lb) 81.4 kg (179 lb 7.3 oz)    Exam: General:  Appears comfortable, calm. She is alert and conversing. Cardiovascular: Regular rate and rhythm, no murmur, rub or gallop. No lower extremity edema. Respiratory: No wheezes, rales or rhonchi. Normal respiratory effort. Some upper airway noise.Marland Kitchen Psychiatric: speech fluent and appropriate  New data reviewed:  CBG stable  LFTs trending down  WBC wnl   CBC and BMP unremarkable   Scheduled Meds: . allopurinol  150 mg Oral Daily  . aspirin  325 mg  Oral Daily  . aztreonam  1 g Intravenous 3 times per day  . divalproex  125 mg Oral BID  . famotidine  20 mg Oral BID  . feeding supplement (ENSURE ENLIVE)  237 mL  Oral BID BM  . guaiFENesin  600 mg Oral BID  . heparin  5,000 Units Subcutaneous 3 times per day  . insulin aspart  0-15 Units Subcutaneous 6 times per day  . PARoxetine  30 mg Oral Daily  . polyvinyl alcohol  1 drop Both Eyes BID  . potassium chloride SA  40 mEq Oral BID  . simvastatin  10 mg Oral QHS  . sodium chloride flush  3 mL Intravenous Q12H   Continuous Infusions: . 0.9 % NaCl with KCl 40 mEq / L 100 mL/hr (11/19/15 2340)    Principal Problem:   Bacteremia Active Problems:   Essential hypertension, benign   Type 2 diabetes with nephropathy (HCC)   Chronic diastolic heart failure (HCC)   Chronic kidney disease (CKD), stage IV (severe) (HCC)   Prolonged Q-T interval on ECG   Loss of weight   Renal mass, right   UTI (lower urinary tract infection)   Protein-calorie malnutrition, severe   Elevated LFTs   Cholelithiasis   Choledocholithiasis   Time spent 20 minutes   By signing my name below, I, Rennis Harding attest that this documentation has been prepared under the direction and in the presence of Murray Hodgkins, MD Electronically signed: Rennis Harding  11/20/2015 12:18pm    I personally performed the services described in this documentation. All medical record entries made by the scribe were at my direction. I have reviewed the chart and agree that the record reflects my personal performance and is accurate and complete. Murray Hodgkins, MD

## 2015-11-21 ENCOUNTER — Non-Acute Institutional Stay (SKILLED_NURSING_FACILITY): Payer: Medicare HMO | Admitting: Internal Medicine

## 2015-11-21 ENCOUNTER — Inpatient Hospital Stay
Admission: RE | Admit: 2015-11-21 | Discharge: 2015-12-11 | Disposition: A | Discharge status: Nursing Home (Includes ICF) | Payer: Medicare HMO | Source: Ambulatory Visit | Attending: Internal Medicine | Admitting: Internal Medicine

## 2015-11-21 ENCOUNTER — Encounter: Payer: Self-pay | Admitting: Internal Medicine

## 2015-11-21 DIAGNOSIS — I4581 Long QT syndrome: Secondary | ICD-10-CM

## 2015-11-21 DIAGNOSIS — N390 Urinary tract infection, site not specified: Secondary | ICD-10-CM | POA: Diagnosis not present

## 2015-11-21 DIAGNOSIS — R9431 Abnormal electrocardiogram [ECG] [EKG]: Secondary | ICD-10-CM

## 2015-11-21 DIAGNOSIS — K8020 Calculus of gallbladder without cholecystitis without obstruction: Secondary | ICD-10-CM | POA: Diagnosis not present

## 2015-11-21 DIAGNOSIS — E1121 Type 2 diabetes mellitus with diabetic nephropathy: Secondary | ICD-10-CM

## 2015-11-21 DIAGNOSIS — R7881 Bacteremia: Secondary | ICD-10-CM | POA: Diagnosis not present

## 2015-11-21 DIAGNOSIS — N2889 Other specified disorders of kidney and ureter: Secondary | ICD-10-CM | POA: Diagnosis not present

## 2015-11-21 LAB — GLUCOSE, CAPILLARY
GLUCOSE-CAPILLARY: 108 mg/dL — AB (ref 65–99)
GLUCOSE-CAPILLARY: 90 mg/dL (ref 65–99)
GLUCOSE-CAPILLARY: 93 mg/dL (ref 65–99)
Glucose-Capillary: 104 mg/dL — ABNORMAL HIGH (ref 65–99)

## 2015-11-21 MED ORDER — CEFUROXIME AXETIL 250 MG PO TABS: 500.0000 mg | ORAL_TABLET | Freq: Two times a day (BID) | ORAL | Status: DC

## 2015-11-21 MED ORDER — CEFUROXIME AXETIL 500 MG PO TABS: 500.0000 mg | ORAL_TABLET | Freq: Two times a day (BID) | ORAL | Status: DC

## 2015-11-21 NOTE — Assessment & Plan Note (Signed)
Continue Ceftin until 11/27/15 Note: She has a history of allergy or intolerance to cephalosporins: She has not had a documented reaction to the cephalosporin or others as per pharmacy review.

## 2015-11-21 NOTE — Assessment & Plan Note (Signed)
Trial off Glargine as A1c so low

## 2015-11-21 NOTE — Progress Notes (Signed)
PROGRESS NOTE  Taylor COINER [id number] DOB: 03/24/1922 DOA: 11/17/2015 PCP: Cyndee Brightly, MD  Summary: 82 year old woman complex past medical history resident of skilled nursing facility presented with history fever, acute confusion, aspiration, multiple allergies including cephalosporins penicillins amoxicillin, carbopenems. Initial evaluation revealed significant leukocytosis, elevated lactic acid, bilirubin. Consider for sepsis secondary to UTI.  Assessment/Plan: 1. E. Coli bacteremia secondary to UTI. Clinically resolved. Afebrile greater than 24 hours. Lactic acid cleared with fluids. CT abd/pelvis no acute features.  2. E. Coli UTI. Stable. History of frequent UTIs. 3. Elevated LFTs, mixed pattern. Asymptomatic, no abdominal pain. Trending downwards. Gallbladder shows cholelithiasis. Suspect transient choledocholithiasis. 4. AKI superimposed on CKD stage IV, resolved 5. Elevated troponin, suspect elevation with AKI. No further evaluation suggested. 6. Acute encephalopathy superimposed on dementia with behavioral disturbance. Acute component resolved. Remains at baseline. 7. COPD, stable 8. DM type 2 with nephropathy, stable. 9. Chronic diastolic CHF, compensated 10. Prolonged QT 11. Right renal mass concerning for RCC. Discussed in detail with daughter. She desires no further investigation. She understands that this likely represents cancer. 12. PMH breast cancer, stroke, pulmonary HTN 13. Weight loss likely multifactorial including poor oral intake. 14. Severe malnutrition, in the context of chronic illness. Nutrition following.    Overall appears better. Change to oral abx and transfer back to SNF.   Continue Ceftin until 3/27  Code Status: DNR DVT prophylaxis: Heparin Family Communication: No family bedside.  Disposition Plan:  Discharge back to SNF  Murray Hodgkins, MD  [facility]  Direct contact number: see [email or web address]; password [email or web address]  7PM-7AM, please contact night-coverage at [email or web address] 11/21/2015, 8:22 AM  LOS: 3 days   Consultants:  None  Procedures:  None  Antibiotics:  Aztreonam 3/18>> 3/20  vancomycin 3/18 >>3/20  Rocephin 3/20>>3/21  Ceftin 3/21>>3/27  HPI/Subjective: Denies any pain and her breathing is fine. Did not eat this morning.    Objective: Filed Vitals:   11/20/15 0619 11/20/15 1116 11/20/15 2028 11/21/15 0449  BP: 165/76  148/61 164/71  Pulse: 87  95 99  Temp: 97.5 F (36.4 C)  98.2 F (36.8 C) 98.1 F (36.7 C)  TempSrc: Oral  Oral Oral  Resp: 18  20 18   Height:      Weight:      SpO2: 95% 93% 94% 97%   No intake or output data in the 24 hours ending 11/21/15 0822   Filed Weights   11/17/15 2249 11/18/15 0437  Weight: 90.719 kg (200 lb) 81.4 kg (179 lb 7.3 oz)    Exam: General: Appears calm and comfortable. Answers simple questions. More conversant today. Cardiovascular: 2/6 holosystolic murmur, RUSB no rub or gallop.   Respiratory: CTA bilaterally, no w/r/r. Normal respiratory effort. Abdomen: soft, ntnd Psychiatric: Appears at baseline. Follows simple commands.   New data reviewed:  CBG stable  Scheduled Meds: . allopurinol  150 mg Oral Daily  . aspirin  325 mg Oral Daily  . cefTRIAXone (ROCEPHIN)  IV  2 g Intravenous Q24H  . divalproex  125 mg Oral BID  . famotidine  20 mg Oral BID  . feeding supplement (ENSURE ENLIVE)  237 mL Oral BID BM  . guaiFENesin  600 mg Oral BID  . heparin  5,000 Units Subcutaneous 3 times per day  . insulin aspart  0-15 Units Subcutaneous 6 times per day  . PARoxetine  30 mg Oral Daily  . polyvinyl alcohol  1 drop Both Eyes BID  . potassium chloride SA  40 mEq Oral  BID  . simvastatin  10 mg Oral QHS  . sodium chloride flush  3 mL Intravenous Q12H  . torsemide  20 mg Oral Daily   Continuous Infusions:    Principal Problem:   Bacteremia Active Problems:   Essential hypertension, benign   Type 2 diabetes with nephropathy  (HCC)   Chronic diastolic heart failure (HCC)   Chronic kidney disease (CKD), stage IV (severe) (HCC)   Prolonged Q-T interval on ECG   Loss of weight   Renal mass, right   UTI (lower urinary tract infection)   Protein-calorie malnutrition, severe   Elevated LFTs   Cholelithiasis   Choledocholithiasis    By signing my name below, I, Rennis Harding attest that this documentation has been prepared under the direction and in the presence of Murray Hodgkins, MD Electronically signed: Rennis Harding  11/21/2015 9:35am   I personally performed the services described in this documentation. All medical record entries made by the scribe were at my direction. I have reviewed the chart and agree that the record reflects my personal performance and is accurate and complete. Murray Hodgkins, MD

## 2015-11-21 NOTE — Progress Notes (Signed)
   Subjective:    Patient ID: [name], female    DOB: 1921-11-28, 82 y.o.   MRN: [id number]  HPI This is a nursing facility follow up for [facility] readmission within 30 days with update of medical problem list and diagnoses with formulation of appropriate care plan as noted. Interim medical record and care plan; diagnostic studies; and change in clinical status since last visit are documented as follows.  HPI: She was hospitalized 3/17-3/21/17 with urinary tract infection associated with bacteremia. Both aerobic and anaerobic cultures drawn 3/18 were positive for gram-negative rods. This is in the context of a history of recurrent urinary tract infections. She was to continue Ceftin until 3/27 for documented Escherichia coli urinary tract infection. Significant is that she has a history of penicillin and cephalosporin "allergy"; but she has been documented to have tolerated multiple cephalosporins in the past as she is presently doing as well. As noted final urine culture documented Escherichia coli. The urinary tract infection was associated with acute kidney injury superimposed on chronic kidney disease, stage III. An incidental finding was a right renal mass concerning for renal cell carcinoma. This has been discussed in detail by the hospitalist staff with the daughter. No further investigation was requested as per the family in view of the patient's advanced age of 45 and severe dementia. Additionally she was found to have elevated liver enzymes possibly related to cholelithiasis The urinary tract infection and bacteremia was associated with acute encephalopathy superimposed on baseline dementia with behavioral disturbance. She is known to have diabetes type 2 with nephropathy. She also has a past medical history of chronic diastolic congestive heart failure. Labs included elevated troponin; this was attributed to the acute kidney injury. 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   Review of Systems  Patient would not answer questions as to review of systems.     Objective:   Physical Exam  Pertinent or positive findings include: She was initially sleeping but did arouse when I spoke to her and attempted to perform an exam. At that point she became garrulous and demanded "get out of here; would you be quiet; and leave me alone" when I attempted to examine her eyes or mouth she closed her eyes tightly and refused to open her mouth. She has a grade 1 systolic murmur at the base. Bowel sounds are decreased but there is no ileus. The dorsalis pedis pulses are slightly decreased. Very strong posterior tibial pulses present. She has minimal trace edema at the lateral malleolar areas.  General appearance :adequately nourished; in no distress. Eyes: No conjunctival inflammation or scleral icterus is present based on limited exam as permitted by patient. Oral exam:  Lips and gums (again exam limited) are healthy appearing. Heart:  Normal rate and regular rhythm. S1 and S2 normal without gallop, click, rub or other extra sounds. Lungs:Chest clear to auscultation; no wheezes, rhonchi,rales ,or rubs present.No increased work of breathing.  Abdomen:  soft and non-tender without masses, organomegaly or hernias noted.  No guarding or rebound.  Skin:Warm & dry.  Intact without suspicious lesions or rashes ; no tenting or jaundice  Lymphatic: No lymphadenopathy is noted about the head, neck.Axillary exam not allowed.  Neuro: Strength, tone grossly normal.     Assessment & Plan:  Diagnoses: See problem list with updates and plan

## 2015-11-21 NOTE — Progress Notes (Signed)
Patient is medically stable for Discharge back to SNF: [facility] Call and message left for Kerri to alert of DC.  Family has been in room and part of care, patient is LTC resident and they are in agreement with DC back to [facility].  FL2 updated, Clinicals faxed to facility. RN to call report and move patient to [facility] when ready.  No other needs or interventions warranted by CSW. Patient DC to SNF.  Lane Hacker, MSW Clinical Social Work: Emergency Room [phone]

## 2015-11-21 NOTE — Assessment & Plan Note (Signed)
K+ was 4.6 @ discharge

## 2015-11-21 NOTE — Progress Notes (Signed)
Pt's IV catheter removed and intact. Pt's IV site clean dry and intact. Pt in stable condition and in no acute distress at time of discharge. Report called and given to [facility]. All questions were answered and no further questions at this time. Pt will be escorted by nurse tech.

## 2015-11-21 NOTE — Assessment & Plan Note (Signed)
Patient's daughter is aware of the probable diagnosis. No further evaluation was requested because the patient's advanced age of 37 and severe dementia with behavioral disorders

## 2015-11-21 NOTE — Care Management Note (Signed)
Case Management Note  Patient Details  Name: KRISTYANNA MCNEMAR MRN: [id number] Date of Birth: 27-Apr-1922  Subjective/Objective:                  Pt admitted with bacteriemia. Pt is from [facility] where she is a lont term resident. Pt plans to return to [facility] [place] is aware and has arranged for return to facility today.   Action/Plan: No CM needs.   Expected Discharge Date:  11/20/15               Expected Discharge Plan:  [place]  In-House Referral:  NA  Discharge planning Services  CM Consult  Post Acute Care Choice:  NA Choice offered to:  NA  DME Arranged:    DME Agency:     HH Arranged:    HH Agency:     Status of Service:  Completed, signed off  Medicare Important Message Given:  Yes Date Medicare IM Given:    Medicare IM give by:    Date Additional Medicare IM Given:    Additional Medicare Important Message give by:     If discussed at [place] of Stay Meetings, dates discussed:    Additional Comments:  Sherald Barge, RN 11/21/2015, 10:13 AM

## 2015-11-21 NOTE — Assessment & Plan Note (Signed)
Gallstones are asymptomatic; no further evaluation or treatment indicated in view of her advanced age and dementia. Clinical monitor for scleral icterus or frank jaundice or clinical signs of abdominal pain.

## 2015-11-21 NOTE — Care Management Important Message (Signed)
Important Message  Patient Details  Name: [name] MRN: [id number] Date of Birth: 1921/12/03   Medicare Important Message Given:  Yes    Sherald Barge, RN 11/21/2015, 10:13 AM

## 2015-11-22 LAB — CULTURE, BLOOD (ROUTINE X 2)

## 2015-11-22 NOTE — Patient Instructions (Signed)
Completed readmission note faxed to [facility].

## 2015-11-23 ENCOUNTER — Encounter: Payer: Self-pay | Admitting: Internal Medicine

## 2015-11-23 ENCOUNTER — Non-Acute Institutional Stay: Payer: Self-pay | Admitting: Internal Medicine

## 2015-11-23 ENCOUNTER — Non-Acute Institutional Stay (SKILLED_NURSING_FACILITY): Payer: Medicare HMO | Admitting: Internal Medicine

## 2015-11-23 DIAGNOSIS — N390 Urinary tract infection, site not specified: Secondary | ICD-10-CM | POA: Diagnosis not present

## 2015-11-23 DIAGNOSIS — R7881 Bacteremia: Secondary | ICD-10-CM | POA: Diagnosis not present

## 2015-11-23 NOTE — Progress Notes (Signed)
   Subjective:    Patient ID: [name], female    DOB: November 05, 1921, 82 y.o.   MRN: [id number]  Fever   [facility] follow up note of active problem. She continues on the Ceftin for Escherichia coli urinary tract infection. Reported blood cultures were positive for gram-negative rods. Temp max in the last 24 hours has been 99.7. Follow-up vital signs have been requested today.    Review of Systems  Constitutional: Positive for fever.       Objective:   Physical Exam  Pertinent or positive findings include: She is sleeping soundly with intermittent snoring. She has a grade 1 systolic murmur. General appearance :adequately nourished; in no distress.  Heart:  Normal rate and regular rhythm. S1 and S2 normal without gallop, click, rub or other extra sounds    Lungs:Chest clear to auscultation; no wheezes, rhonchi,rales ,or rubs present.No increased work of breathing.   Abdomen: bowel sounds normal, soft and non-tender without masses, organomegaly or hernias noted.  No guarding or rebound.  Vascular : all pulses equal ; no bruits present.  Skin:Warm & dry.  Intact without suspicious lesions or rashes ; no tenting or jaundice   .   Assessment & Plan:  #1  Escherichia coli urinary tract infection associated with bacteremia. Clinically at this time there does not appear to be any signs of sepsis. Additionally white count is now normal at 9600 #2 anemia, essentially stable Plan: Monitor closely for evidence of recurrent infection. Consider reculture urine after completion of antibiotics. If she does indeed have recurrent urinary tract infection; urology evaluation should be considered to rule out any predisposition to such. Also suppressive antibiotic therapy could be considered.

## 2015-11-23 NOTE — Assessment & Plan Note (Signed)
Clinically there is no evidence of active sepsis at this time; ongoing monitor will be continued. Urine will be recultured should she spike a temperature. White count has returned to normal and her anemia is essentially stable.

## 2015-11-23 NOTE — Progress Notes (Signed)
[place] follow up 11/23/15   Subjective:    Patient ID: [name], female    DOB: [birth year], 82 y.o.   MRN: [id number]  HPI  The patient is being followed after hospitalization for Escherichia coli urinary tract infection with bacteremia. Continues on oral Ceftin. Blood cultures were positive for gram-negative rods both aerobically and anaerobically. Because of her advanced dementia she is unable to give any history. Maximum temperature was 99.7 yesterday. Follow-up vital signs are pending and will be entered. White count is now 9600.    Review of Systems Patient unable to provide history as to symptoms    Objective:   Physical Exam  Pertinent or positive findings include: She is sleeping soundly and intermittently snoring. She has a grade 1 systolic murmur. General appearance :adequately nourished; in no distress.  Heart:  Normal rate and regular rhythm. S1 and S2 normal without gallop, click, rub or other extra sounds    Lungs:Chest clear to auscultation; no wheezes, rhonchi,rales ,or rubs presentNo increased work of breathing.   Abdomen: bowel sounds normal, soft and non-tender without masses, organomegaly or hernias noted.  No guarding or rebound.   Vascular : all pulses equal ; no bruits present.  Skin:Warm & dry.  Intact without suspicious lesions or rashes ; no tenting or jaundice         Assessment & Plan:  #1 Escherichia coli urinary tract infection associated with bacteremia. Clinically there is no evidence of decompensation. She's had minimal temperature elevation. White count has now returned to normal. Plan: Urine will be recultured should she spike a temperature. If she does prove to have recurrent urinary tract infections urology consultation would be considered to rule out any predisposition to urinary tract infections such as a bladder polyp or ulcer. Additionally suppressive antibody therapy could be considered.

## 2015-11-28 ENCOUNTER — Non-Acute Institutional Stay (SKILLED_NURSING_FACILITY): Payer: Medicare HMO | Admitting: Internal Medicine

## 2015-11-28 ENCOUNTER — Encounter: Payer: Self-pay | Admitting: Internal Medicine

## 2015-11-28 DIAGNOSIS — R5383 Other fatigue: Secondary | ICD-10-CM | POA: Diagnosis not present

## 2015-11-28 DIAGNOSIS — R7881 Bacteremia: Secondary | ICD-10-CM | POA: Diagnosis not present

## 2015-11-28 DIAGNOSIS — R627 Adult failure to thrive: Secondary | ICD-10-CM | POA: Diagnosis not present

## 2015-11-28 DIAGNOSIS — N390 Urinary tract infection, site not specified: Secondary | ICD-10-CM | POA: Diagnosis not present

## 2015-11-28 DIAGNOSIS — N2889 Other specified disorders of kidney and ureter: Secondary | ICD-10-CM

## 2015-11-28 NOTE — Progress Notes (Signed)
Patient ID: [name], female   DOB: 15-Nov-1921, 82 y.o.   MRN: [id number]  Location:  [facility]   Place of Service:  SNF [phone])   Taylor Cobble, MD  Patient Care Team: Hendricks Limes, MD as PCP - General (Internal Medicine) Satira Sark, MD as Consulting Physician (Cardiology)  Extended Emergency Contact Information Primary Emergency Contact: [name],[name] Address: [address]          [place], [place] [address] [name] of [place] Phone: [phone] Mobile Phone: [phone] Relation: Daughter Secondary Emergency Contact: Fox,Cindy  [place] Phone: [phone] Relation: Daughter  Goals of care: Advanced Directive information Advanced Directives 11/28/2015  Does patient have an advance directive? Yes  Type of Paramedic of [place];Living will;Out of facility DNR (pink MOST or yellow form)  Does patient want to make changes to advanced directive? No - Patient declined  Copy of advanced directive(s) in chart? Yes  Pre-existing out of facility DNR order (yellow form or pink MOST form) -     Chief Complaint  Patient presents with  . Acute Visit  Secondary to increased lethargy poor by mouth intake   HPI:  Pt is a 82 y.o. female seen today for an acute visit for Altered mental status some increased lethargy poor by mouth intake.  Patient was recently hospitalized for encephalopathy found to have an Escherichia coli UTI with bacteremia and was treated with antibiotic she has just finished a course of Ceftin.  She also had an incidental finding of a right renal mass family wishes conservative follow-up here since she is a poor candidate for any aggressive intervention.  She also had some elevated liver function tests thought to have cholelithiasis again this has been asymptomatic.  Apparently when patient returned to hospital she very well for the first day but since then her daughter  states she's been increasingly weak with poor by mouth intake-essentially just not in her head yes or no now instead of Foley speaking.  Patient has had periods where she appeared to have failure to thrive and then rebounded somewhat however the periods of lethargy appear to be increasing.  Her vital signs are stable-I spoke with her daughter extensively at bedside and daughter stated her mom does not want to go back to the hospital  and receive aggressive treatment-or for  that matter want any additional labs drawn. Apparently patient was quite agitated with lab draws in the hospital.  Appear she has lost a significant amount of weight about 13 pounds since the beginning of the month.  Per discussion with her daughter [name]-desire is essentially for comfort care here with no further hospitalization her labs drawn at this point encourage fluids encourage by mouth intake but no aggressive measures.       Past Medical History  Diagnosis Date  . Breast cancer ([place])   . Essential hypertension, benign   . Depression   . GERD (gastroesophageal reflux disease)   . Hypercholesteremia   . CKD (chronic kidney disease) stage 3, GFR 30-59 ml/min   . Pneumonia   . UTI (lower urinary tract infection)   . Arthritis   . Asthma   . Venous stasis   . Type 2 diabetes mellitus ([place])   . Chronic diastolic heart failure (H[facility])     LVEF 70%  . Mitral stenosis     Moderate  . Secondary pulmonary hypertension ([facility])     PASP 64 mmHg  . CHF (  congestive heart failure) ([place])   . Cholelithiasis 11/19/2015  . Choledocholithiasis 11/19/2015   Past Surgical History  Procedure Laterality Date  . Replacement total knee    . Mastectomy    . Total hip arthroplasty    . Carotid endarterectomy    . Cataracts      Allergies  Allergen Reactions  . Aleve [Naproxen Sodium]   . Carbapenems Other (See Comments)    unknown  . Cephalosporins Other (See Comments)    Unknown/ pt has had rocephin in the past and  tolerated  . Codeine Nausea Only  . Amoxicillin Rash  . Penicillins Rash   Medications.  Allopurinol 200 mg daily.  Aspirin 325 mg daily.  Demadex 80 mg every morning-60 mg every afternoon.  Depakote 125 mg twice a day.  Lopressor 12.5 mg twice a day.  Norvasc 10 mg daily.  Paxil 30 mg daily.  Potassium 40 mEq twice a day.  Ranitidine 150 mg daily.  UTI cranberry prophylactic daily.     Review of Systems--please see history of present illness very limited-patient states her head no when asked if she is having any pain or discomfort no cough or congestion has been noted  Immunization History  Administered Date(s) Administered  . Influenza Split 05/26/2013  . Influenza-Unspecified 06/08/2014  . PPD Test 09/21/2013   Pertinent  Health Maintenance Due  Topic Date Due  . OPHTHALMOLOGY EXAM  06/18/1934  . URINE MICROALBUMIN  06/18/1934  . DEXA SCAN  06/18/1989  . PNA vac Low Risk Adult (1 of 2 - PCV13) 06/18/1989  . FOOT EXAM  09/06/2014  . INFLUENZA VACCINE  04/03/2015  . HEMOGLOBIN A1C  02/08/2016   No flowsheet data found. Functional Status Survey:    Filed Vitals:   11/28/15 1511  BP: 143/95  Pulse: 78  Temp: 90.6 F (32.6 C)  TempSrc: Oral  Resp: 20  Height: 5\' 5"  (1.651 m)  Weight: 171 lb 3.2 oz (77.656 kg)   Body mass index is 28.49 kg/(m^2). Physical Exam   She is afebrile pulse 78 respirations 20 blood pressure 143/95-117/54 most recently weight is 171.2 again he appears to be a steady weight loss.  In general this is a frail appearing LE female in no distress lying in her recliner.  Her skin is warm and dry.  Eyes pupils do appear reactive to light visual acuity appears intact although she tends to shut her eyes quite a bit.  Oropharynx is clear mucous membranes at this point appear somewhat moist.  Chest there is poor respiratory effort but clear to auscultation no overt congestion no labored breathing.  Heart is regular irregular  rate and rhythm with a minimal systolic murmur-she has venous stasis changes-1-2 plus edema bilaterally TED hose are in place.  Abdomen is obese soft does not appear to be overtly tender to palpation bowel sounds are positive.  Muscle skeletal difficult exam since patient did not really follow verbal instructions well but grip strength appeared to be grossly intact able to move her extremities with significant lower extremity weakness.  Neurologic as noted above continues to be responsive but lethargic appearing.  She basically shakes her head yes or no when asked questions area  Psych as noted above.   Labs reviewed:  Recent Labs  11/17/15 2315 11/18/15 0702 11/19/15 0704 11/20/15 0920  NA 141  --  138 136  K 3.9  --  4.2 4.6  CL 104  --  109 108  CO2 24  --  22 22  GLUCOSE 184*  --  157* 134*  BUN 39*  --  24* 21*  CREATININE 1.87*  --  1.09* 1.04*  CALCIUM 9.0  --  8.4* 8.3*  MG  --  1.5*  --   --     Recent Labs  11/17/15 2315 11/19/15 0704 11/20/15 0920  AST 194* 61* 38  ALT 136* 74* 54  ALKPHOS 139* 138* 145*  BILITOT 2.6* 1.6* 0.8  PROT 7.2 6.4* 6.2*  ALBUMIN 3.3* 2.8* 2.6*    Recent Labs  11/17/15 1910 11/17/15 2315 11/18/15 0702 11/19/15 0704 11/20/15 0920  WBC 32.7* 28.7* 19.1* 13.0* 9.6  NEUTROABS 29.8* 25.4* 16.5*  --   --   HGB 12.7 12.3 11.1* 10.9* 10.4*  HCT 39.3 38.1 34.5* 33.3* 32.3*  MCV 88.7 88.0 88.7 87.9 89.0  PLT 202 185 162 152 158   Lab Results  Component Value Date   TSH 3.775 04/28/2015   Lab Results  Component Value Date   HGBA1C 6.6* 08/10/2015   Lab Results  Component Value Date   CHOL 157 09/10/2013   HDL 32* 09/10/2013   LDLCALC 90 09/10/2013   TRIG 176* 09/10/2013   CHOLHDL 4.9 09/10/2013    Significant Diagnostic Results in last 30 days:  Ct Abdomen Pelvis Wo Contrast  11/18/2015  CLINICAL DATA:  Acute onset of fever and leukocytosis. Confusion. Left lower quadrant abdominal pain. Initial encounter. EXAM:  CT ABDOMEN AND PELVIS WITHOUT CONTRAST TECHNIQUE: Multidetector CT imaging of the abdomen and pelvis was performed following the standard protocol without IV contrast. COMPARISON:  None. FINDINGS: Mild bibasilar atelectasis or scarring is noted. Calcification is noted at the mitral valve. The liver and spleen are unremarkable in appearance. Stones are noted dependently within the gallbladder. The gallbladder is otherwise unremarkable. The pancreas and adrenal glands are unremarkable. There appears be a somewhat complex 4.1 cm mass arising at the lower pole of the right kidney. This is concerning for renal cell carcinoma. A 3.0 cm cyst is noted near the upper pole of the right kidney. Nonspecific perinephric stranding is noted bilaterally. There is no evidence of hydronephrosis. No renal or ureteral stones are identified. No free fluid is identified. The small bowel is unremarkable in appearance. The stomach is within normal limits. No acute vascular abnormalities are seen. Scattered calcification is noted along the abdominal aorta and its branches. The appendix is not well characterized; there is no evidence of appendicitis. Mild diverticulosis is noted along the mid sigmoid colon. The colon is otherwise unremarkable. The bladder is mildly distended and grossly unremarkable. A mildly calcified uterine fibroid is seen. The uterus is otherwise unremarkable. No suspicious adnexal masses are seen. No inguinal lymphadenopathy is seen. No acute osseous abnormalities are identified. The patient's left hip arthroplasty is incompletely imaged but appears grossly unremarkable. Multilevel vacuum phenomenon is noted along the lumbar spine. IMPRESSION: 1. Apparent complex 4.1 cm mass at the lower pole of the right kidney. This is concerning for renal cell carcinoma. Renal ultrasound would be helpful for further evaluation, as deemed clinically appropriate. 2. Cholelithiasis.  Gallbladder otherwise unremarkable. 3. Mild bibasilar  atelectasis or scarring noted. 4. Calcification noted at the mitral valve. 5. Right renal cyst noted. 6. Scattered calcification along the abdominal aorta and its branches. 7. Mild diverticulosis along the mid sigmoid colon. 8. Mildly calcified uterine fibroid noted. 9. Mild degenerative change along the lumbar spine. Electronically Signed   By: Garald Balding M.D.   On: 11/18/2015 02:47  Dg Chest 2 View  11/17/2015  CLINICAL DATA:  Fever.  Altered mental status. EXAM: CHEST  2 VIEW COMPARISON:  09/05/2015. FINDINGS: Stable enlarged cardiac silhouette. Interval small amount of linear atelectasis in the left lower lung zone. Mild diffuse peribronchial thickening without significant change. Stable calcified mediastinal and right hilar lymph nodes. Thoracic spine degenerative changes. Left axillary surgical clips. IMPRESSION: 1. No acute abnormality. 2. Stable cardiomegaly and chronic bronchitic changes. Electronically Signed   By: Claudie Revering M.D.   On: 11/17/2015 20:08   US Renal  11/18/2015  CLINICAL DATA:  Right renal mass seen on CT scan. EXAM: RENAL / URINARY TRACT ULTRASOUND COMPLETE COMPARISON:  CT scan from earlier today FINDINGS: Right Kidney: Length: 10.9 cm. There is a complex, apparently solid mass off the medial lower right kidney also seen on today's CT scan. This mass measures 5 x 4 x 4.3 cm. A 4 cm cyst is seen in the upper pole. No hydronephrosis. Left Kidney: Length: 10.3 cm.  No suspicious masses or hydronephrosis. Bladder: The bladder is not well assessed due to poor distention. IMPRESSION: 1. The lower pole right renal mass seen on recent CT imaging is heterogeneous in appearance today and appears to have internal blood flow. This is highly suspicious for a renal cell carcinoma. A contrast-enhanced MRI or CT would be the most definitive imaging studies for complete evaluation. Electronically Signed   By: Dorise Bullion III M.D   On: 11/18/2015 12:17   Dg Chest Port 1 View  11/17/2015   CLINICAL DATA:  Acute onset of fever and shortness of breath. Code sepsis. Initial encounter. EXAM: PORTABLE CHEST 1 VIEW COMPARISON:  Chest radiograph performed earlier today at 7:48 p.m. FINDINGS: The lungs are mildly hypoexpanded. Mild peribronchial thickening is noted, with minimal bilateral atelectasis. There is no evidence of pleural effusion or pneumothorax. The cardiomediastinal silhouette is within normal limits. No acute osseous abnormalities are seen. Mild degenerative change is noted at the glenohumeral joints bilaterally. Clips are seen overlying the left axilla. IMPRESSION: Lungs mildly hypoexpanded. Mild chronic peribronchial thickening, with minimal bilateral atelectasis. Electronically Signed   By: Garald Balding M.D.   On: 11/17/2015 23:55   US Abdomen Limited Ruq  11/19/2015  CLINICAL DATA:  Elevated liver enzymes.  History of breast carcinoma EXAM: US ABDOMEN LIMITED - RIGHT UPPER QUADRANT COMPARISON:  CT abdomen and pelvis November 18, 2015 FINDINGS: Gallbladder: Within the gallbladder, there are multiple echogenic foci which move and shadow consistent with gallstones. Largest gallstone measures approximately 1 cm in length. There is no gallbladder wall thickening or pericholecystic fluid. No sonographic Murphy sign noted by sonographer. Common bile duct: Diameter: 4 mm. There is no intrahepatic or extrahepatic biliary duct dilatation. Liver: No focal lesion identified. Within normal limits in parenchymal echogenicity. IMPRESSION: Cholelithiasis.  Study otherwise unremarkable. Electronically Signed   By: Lowella Grip III M.D.   On: 11/19/2015 11:49    Assessment/Plan #1 failure to thrive with lethargy-as noted above I did have a fairly extensive discussion with her daughter at bedside-her daughter does not really want any more aggressive measures she has spoken with her mother about this--her mother has stated she does not want to go back to the hospital and does not want any further  labs.  I suspect getting her to take her medicines will be somewhat challenging-we will see how she does again at times she does perk up a bit although again these episodes of lethargy appear to be increasing in frequency and  this is now complicated with the discovery of a right renal mass which may be contributing to this as well.  At this point she does not appear to be in any pain at this will have to be monitored.  At some point her daughter is open to conferring with hospice again we will monitor how her mother does here and her daughter is in agreement with this.  In regards to UTI and bacteremia she has finished her antibiotic again there are no wishes for labs at this time for follow-up.  She does have history of diastolic CHF continues on Demadex with potassium --I don't believe she is really taking these to any great extent currently but will hold the secondary to poor by mouth intake for now--also will hold Depakote this is for mood stabilization but again she is quite lethargic     decline may be progressing which daughter understands as noted above. Again by mouth intake will have to be encouraged in fact she did drink some fluids and ate some soup when I rechecked her  [id number]-of note greater than 35 minutes spent assessing patient-reviewing her chart-labs-and coordinating and formal eating a plan of care-of note greater than 50% of time spent:coordinating  care with chart review and discussion with daughter

## 2015-11-28 NOTE — Progress Notes (Signed)
Patient ID: [name], female   DOB: Apr 25, 1922, 83 y.o.   MRN: [id number]

## 2015-12-07 NOTE — Progress Notes (Signed)
       Hendricks Limes, MD at 11/23/2015 9:05 AM     Status: Signed       Expand All Collapse All   [place] follow up 11/23/15   Subjective:    Patient ID: [name], female DOB: Apr 15, 1922, 82 y.o. MRN: [id number]  HPI  The patient is being followed after hospitalization for Escherichia coli urinary tract infection with bacteremia. Continues on oral Ceftin. Blood cultures were positive for gram-negative rods both aerobically and anaerobically. Because of her advanced dementia she is unable to give any history. Maximum temperature was 99.7 yesterday. Follow-up vital signs are pending and will be entered. White count is now 9600.    Review of Systems Patient unable to provide history as to symptoms    Objective:   Physical Exam  Pertinent or positive findings include: She is sleeping soundly and intermittently snoring. She has a grade 1 systolic murmur. General appearance :adequately nourished; in no distress.  Heart: Normal rate and regular rhythm. S1 and S2 normal without gallop, click, rub or other extra sounds   Lungs:Chest clear to auscultation; no wheezes, rhonchi,rales ,or rubs presentNo increased work of breathing.   Abdomen: bowel sounds normal, soft and non-tender without masses, organomegaly or hernias noted. No guarding or rebound.   Vascular : all pulses equal ; no bruits present.  Skin:Warm & dry. Intact without suspicious lesions or rashes ; no tenting or jaundice         Assessment & Plan:  #1 Escherichia coli urinary tract infection associated with bacteremia. Clinically there is no evidence of decompensation. She's had minimal temperature elevation. White count has now returned to normal. Plan: Urine will be recultured should she spike a temperature. If she does prove to have recurrent urinary tract infections urology consultation would be considered to rule out any predisposition to urinary tract infections  such as a bladder polyp or ulcer. Additionally suppressive antibody therapy could be considered.

## 2015-12-07 NOTE — Addendum Note (Signed)
Addended byUnice Cobble F on: 12/07/2015 02:04 PM   Modules accepted: Level of Service

## 2015-12-11 ENCOUNTER — Inpatient Hospital Stay (HOSPITAL_COMMUNITY): Payer: Medicare HMO

## 2015-12-11 ENCOUNTER — Encounter (HOSPITAL_COMMUNITY): Payer: Self-pay

## 2015-12-11 ENCOUNTER — Inpatient Hospital Stay (HOSPITAL_COMMUNITY)
Admission: EM | Admit: 2015-12-11 | Discharge: 2015-12-15 | DRG: 871 | Disposition: A | Discharge status: Skilled Nursing Facility | Payer: Medicare HMO | Attending: Internal Medicine | Admitting: Internal Medicine

## 2015-12-11 ENCOUNTER — Emergency Department (HOSPITAL_COMMUNITY): Payer: Medicare HMO

## 2015-12-11 DIAGNOSIS — E7800 Pure hypercholesterolemia, unspecified: Secondary | ICD-10-CM | POA: Diagnosis present

## 2015-12-11 DIAGNOSIS — E1122 Type 2 diabetes mellitus with diabetic chronic kidney disease: Secondary | ICD-10-CM | POA: Diagnosis present

## 2015-12-11 DIAGNOSIS — Z7189 Other specified counseling: Secondary | ICD-10-CM | POA: Insufficient documentation

## 2015-12-11 DIAGNOSIS — K810 Acute cholecystitis: Secondary | ICD-10-CM

## 2015-12-11 DIAGNOSIS — Z79899 Other long term (current) drug therapy: Secondary | ICD-10-CM | POA: Diagnosis not present

## 2015-12-11 DIAGNOSIS — I248 Other forms of acute ischemic heart disease: Secondary | ICD-10-CM | POA: Diagnosis present

## 2015-12-11 DIAGNOSIS — E1121 Type 2 diabetes mellitus with diabetic nephropathy: Secondary | ICD-10-CM | POA: Diagnosis present

## 2015-12-11 DIAGNOSIS — A4151 Sepsis due to Escherichia coli [E. coli]: Secondary | ICD-10-CM | POA: Diagnosis not present

## 2015-12-11 DIAGNOSIS — A419 Sepsis, unspecified organism: Secondary | ICD-10-CM | POA: Diagnosis present

## 2015-12-11 DIAGNOSIS — Z9010 Acquired absence of unspecified breast and nipple: Secondary | ICD-10-CM

## 2015-12-11 DIAGNOSIS — E872 Acidosis, unspecified: Secondary | ICD-10-CM

## 2015-12-11 DIAGNOSIS — B373 Candidiasis of vulva and vagina: Secondary | ICD-10-CM | POA: Diagnosis present

## 2015-12-11 DIAGNOSIS — B965 Pseudomonas (aeruginosa) (mallei) (pseudomallei) as the cause of diseases classified elsewhere: Secondary | ICD-10-CM | POA: Diagnosis present

## 2015-12-11 DIAGNOSIS — I272 Other secondary pulmonary hypertension: Secondary | ICD-10-CM | POA: Diagnosis present

## 2015-12-11 DIAGNOSIS — Z794 Long term (current) use of insulin: Secondary | ICD-10-CM

## 2015-12-11 DIAGNOSIS — Z515 Encounter for palliative care: Secondary | ICD-10-CM | POA: Insufficient documentation

## 2015-12-11 DIAGNOSIS — R6521 Severe sepsis with septic shock: Secondary | ICD-10-CM | POA: Diagnosis present

## 2015-12-11 DIAGNOSIS — N390 Urinary tract infection, site not specified: Secondary | ICD-10-CM | POA: Diagnosis present

## 2015-12-11 DIAGNOSIS — Z825 Family history of asthma and other chronic lower respiratory diseases: Secondary | ICD-10-CM

## 2015-12-11 DIAGNOSIS — N183 Chronic kidney disease, stage 3 (moderate): Secondary | ICD-10-CM | POA: Diagnosis present

## 2015-12-11 DIAGNOSIS — R410 Disorientation, unspecified: Secondary | ICD-10-CM

## 2015-12-11 DIAGNOSIS — I150 Renovascular hypertension: Secondary | ICD-10-CM | POA: Diagnosis present

## 2015-12-11 DIAGNOSIS — K819 Cholecystitis, unspecified: Secondary | ICD-10-CM | POA: Diagnosis not present

## 2015-12-11 DIAGNOSIS — C649 Malignant neoplasm of unspecified kidney, except renal pelvis: Secondary | ICD-10-CM | POA: Diagnosis present

## 2015-12-11 DIAGNOSIS — Z853 Personal history of malignant neoplasm of breast: Secondary | ICD-10-CM | POA: Diagnosis not present

## 2015-12-11 DIAGNOSIS — F0390 Unspecified dementia without behavioral disturbance: Secondary | ICD-10-CM | POA: Diagnosis present

## 2015-12-11 DIAGNOSIS — E785 Hyperlipidemia, unspecified: Secondary | ICD-10-CM | POA: Diagnosis present

## 2015-12-11 DIAGNOSIS — J45909 Unspecified asthma, uncomplicated: Secondary | ICD-10-CM | POA: Diagnosis present

## 2015-12-11 DIAGNOSIS — B9620 Unspecified Escherichia coli [E. coli] as the cause of diseases classified elsewhere: Secondary | ICD-10-CM

## 2015-12-11 DIAGNOSIS — R7881 Bacteremia: Secondary | ICD-10-CM | POA: Diagnosis not present

## 2015-12-11 DIAGNOSIS — R509 Fever, unspecified: Secondary | ICD-10-CM | POA: Diagnosis present

## 2015-12-11 DIAGNOSIS — Z87440 Personal history of urinary (tract) infections: Secondary | ICD-10-CM | POA: Diagnosis not present

## 2015-12-11 DIAGNOSIS — I130 Hypertensive heart and chronic kidney disease with heart failure and stage 1 through stage 4 chronic kidney disease, or unspecified chronic kidney disease: Secondary | ICD-10-CM | POA: Diagnosis present

## 2015-12-11 DIAGNOSIS — Z96642 Presence of left artificial hip joint: Secondary | ICD-10-CM | POA: Diagnosis present

## 2015-12-11 DIAGNOSIS — K219 Gastro-esophageal reflux disease without esophagitis: Secondary | ICD-10-CM | POA: Diagnosis present

## 2015-12-11 DIAGNOSIS — Z7982 Long term (current) use of aspirin: Secondary | ICD-10-CM | POA: Diagnosis not present

## 2015-12-11 DIAGNOSIS — R32 Unspecified urinary incontinence: Secondary | ICD-10-CM | POA: Diagnosis present

## 2015-12-11 DIAGNOSIS — I5032 Chronic diastolic (congestive) heart failure: Secondary | ICD-10-CM | POA: Diagnosis present

## 2015-12-11 DIAGNOSIS — Z66 Do not resuscitate: Secondary | ICD-10-CM | POA: Diagnosis present

## 2015-12-11 DIAGNOSIS — K8066 Calculus of gallbladder and bile duct with acute and chronic cholecystitis without obstruction: Secondary | ICD-10-CM | POA: Diagnosis present

## 2015-12-11 DIAGNOSIS — H9190 Unspecified hearing loss, unspecified ear: Secondary | ICD-10-CM | POA: Diagnosis present

## 2015-12-11 DIAGNOSIS — Z833 Family history of diabetes mellitus: Secondary | ICD-10-CM | POA: Diagnosis not present

## 2015-12-11 LAB — URINE MICROSCOPIC-ADD ON: Squamous Epithelial / LPF: NONE SEEN

## 2015-12-11 LAB — CBC WITH DIFFERENTIAL/PLATELET
BASOS ABS: 0 10*3/uL (ref 0.0–0.1)
BASOS PCT: 0 %
EOS ABS: 0 10*3/uL (ref 0.0–0.7)
EOS PCT: 0 %
HCT: 37.6 % (ref 36.0–46.0)
Hemoglobin: 12.2 g/dL (ref 12.0–15.0)
LYMPHS PCT: 1 %
Lymphs Abs: 0.3 10*3/uL — ABNORMAL LOW (ref 0.7–4.0)
MCH: 27.8 pg (ref 26.0–34.0)
MCHC: 32.4 g/dL (ref 30.0–36.0)
MCV: 85.6 fL (ref 78.0–100.0)
MONO ABS: 0.5 10*3/uL (ref 0.1–1.0)
Monocytes Relative: 2 %
Neutro Abs: 21.9 10*3/uL — ABNORMAL HIGH (ref 1.7–7.7)
Neutrophils Relative %: 97 %
PLATELETS: 317 10*3/uL (ref 150–400)
RBC: 4.39 MIL/uL (ref 3.87–5.11)
RDW: 14.9 % (ref 11.5–15.5)
WBC: 22.6 10*3/uL — AB (ref 4.0–10.5)

## 2015-12-11 LAB — COMPREHENSIVE METABOLIC PANEL
ALK PHOS: 327 U/L — AB (ref 38–126)
ALT: 54 U/L (ref 14–54)
AST: 145 U/L — AB (ref 15–41)
Albumin: 3.2 g/dL — ABNORMAL LOW (ref 3.5–5.0)
Anion gap: 12 (ref 5–15)
BILIRUBIN TOTAL: 2.5 mg/dL — AB (ref 0.3–1.2)
BUN: 22 mg/dL — AB (ref 6–20)
CALCIUM: 8.8 mg/dL — AB (ref 8.9–10.3)
CO2: 20 mmol/L — ABNORMAL LOW (ref 22–32)
Chloride: 102 mmol/L (ref 101–111)
Creatinine, Ser: 1.29 mg/dL — ABNORMAL HIGH (ref 0.44–1.00)
GFR calc Af Amer: 41 mL/min — ABNORMAL LOW (ref >60)
GFR, EST NON AFRICAN AMERICAN: 35 mL/min — AB (ref >60)
Glucose, Bld: 171 mg/dL — ABNORMAL HIGH (ref 65–99)
POTASSIUM: 4.1 mmol/L (ref 3.5–5.1)
Sodium: 134 mmol/L — ABNORMAL LOW (ref 135–145)
TOTAL PROTEIN: 8.2 g/dL — AB (ref 6.5–8.1)

## 2015-12-11 LAB — URINALYSIS, ROUTINE W REFLEX MICROSCOPIC
GLUCOSE, UA: NEGATIVE mg/dL
Ketones, ur: NEGATIVE mg/dL
Nitrite: POSITIVE — AB
PROTEIN: 100 mg/dL — AB
Specific Gravity, Urine: 1.01 (ref 1.005–1.030)
pH: 5.5 (ref 5.0–8.0)

## 2015-12-11 LAB — TROPONIN I
TROPONIN I: 0.09 ng/mL — AB (ref <0.031)
Troponin I: 0.06 ng/mL — ABNORMAL HIGH (ref <0.031)
Troponin I: 0.09 ng/mL — ABNORMAL HIGH (ref <0.031)

## 2015-12-11 LAB — LIPASE, BLOOD: LIPASE: 17 U/L (ref 11–51)

## 2015-12-11 LAB — GLUCOSE, CAPILLARY
Glucose-Capillary: 137 mg/dL — ABNORMAL HIGH (ref 65–99)
Glucose-Capillary: 121 mg/dL — ABNORMAL HIGH (ref 65–99)
Glucose-Capillary: 133 mg/dL — ABNORMAL HIGH (ref 65–99)

## 2015-12-11 LAB — LACTIC ACID, PLASMA
Lactic Acid, Venous: 4.1 mmol/L (ref 0.5–2.0)
Lactic Acid, Venous: 6.2 mmol/L (ref 0.5–2.0)

## 2015-12-11 MED ORDER — ALBUTEROL SULFATE (2.5 MG/3ML) 0.083% IN NEBU: 2.5000 mg | INHALATION_SOLUTION | Freq: Four times a day (QID) | RESPIRATORY_TRACT | Status: DC | PRN

## 2015-12-11 MED ORDER — ACETAMINOPHEN 325 MG PO TABS
650.0000 mg | ORAL_TABLET | Freq: Four times a day (QID) | ORAL | Status: DC | PRN
  Administered 2015-12-11: 650 mg via ORAL
  Filled 2015-12-11: qty 2

## 2015-12-11 MED ORDER — ENOXAPARIN SODIUM 40 MG/0.4ML ~~LOC~~ SOLN: 40.0000 mg | SUBCUTANEOUS | Status: DC

## 2015-12-11 MED ORDER — POLYETHYL GLYCOL-PROPYL GLYCOL 0.4-0.3 % OP GEL: Freq: Two times a day (BID) | OPHTHALMIC | Status: DC

## 2015-12-11 MED ORDER — POLYVINYL ALCOHOL 1.4 % OP SOLN
1.0000 [drp] | Freq: Two times a day (BID) | OPHTHALMIC | Status: DC
  Administered 2015-12-11 – 2015-12-12 (×3): 1 [drp] via OPHTHALMIC
  Filled 2015-12-11: qty 15

## 2015-12-11 MED ORDER — CETYLPYRIDINIUM CHLORIDE 0.05 % MT LIQD
7.0000 mL | Freq: Two times a day (BID) | OROMUCOSAL | Status: DC
  Administered 2015-12-11 – 2015-12-14 (×3): 7 mL via OROMUCOSAL

## 2015-12-11 MED ORDER — DEXTROSE 5 % IV SOLN
1.0000 g | Freq: Three times a day (TID) | INTRAVENOUS | Status: DC
  Administered 2015-12-11 – 2015-12-15 (×12): 1 g via INTRAVENOUS
  Filled 2015-12-11 (×21): qty 1

## 2015-12-11 MED ORDER — ASPIRIN 325 MG PO TABS
325.0000 mg | ORAL_TABLET | Freq: Every day | ORAL | Status: DC
  Administered 2015-12-11: 325 mg via ORAL
  Filled 2015-12-11 (×3): qty 1

## 2015-12-11 MED ORDER — ALLOPURINOL 100 MG PO TABS
200.0000 mg | ORAL_TABLET | Freq: Every day | ORAL | Status: DC
  Administered 2015-12-11: 200 mg via ORAL
  Filled 2015-12-11 (×3): qty 2

## 2015-12-11 MED ORDER — ACETAMINOPHEN 650 MG RE SUPP: 650.0000 mg | Freq: Four times a day (QID) | RECTAL | Status: DC | PRN

## 2015-12-11 MED ORDER — VANCOMYCIN HCL IN DEXTROSE 1-5 GM/200ML-% IV SOLN
1000.0000 mg | Freq: Once | INTRAVENOUS | Status: AC
  Administered 2015-12-11: 1000 mg via INTRAVENOUS
  Filled 2015-12-11: qty 200

## 2015-12-11 MED ORDER — DEXTROSE 5 % IV SOLN
2.0000 g | Freq: Once | INTRAVENOUS | Status: AC
  Administered 2015-12-11: 2 g via INTRAVENOUS
  Filled 2015-12-11: qty 2

## 2015-12-11 MED ORDER — INSULIN ASPART 100 UNIT/ML ~~LOC~~ SOLN
0.0000 [IU] | Freq: Three times a day (TID) | SUBCUTANEOUS | Status: DC
  Administered 2015-12-11 (×2): 1 [IU] via SUBCUTANEOUS

## 2015-12-11 MED ORDER — LIDOCAINE HCL (PF) 2 % IJ SOLN
10.0000 mL | Freq: Once | INTRAMUSCULAR | Status: AC
  Administered 2015-12-11: 10 mL
  Filled 2015-12-11: qty 10

## 2015-12-11 MED ORDER — ACETAMINOPHEN 650 MG RE SUPP
650.0000 mg | Freq: Once | RECTAL | Status: AC
  Administered 2015-12-11: 650 mg via RECTAL
  Filled 2015-12-11: qty 1

## 2015-12-11 MED ORDER — SODIUM CHLORIDE 0.9 % IV BOLUS (SEPSIS): 500.0000 mL | INTRAVENOUS | Status: AC

## 2015-12-11 MED ORDER — AMLODIPINE BESYLATE 5 MG PO TABS
10.0000 mg | ORAL_TABLET | Freq: Every day | ORAL | Status: DC
  Administered 2015-12-11: 10 mg via ORAL
  Filled 2015-12-11: qty 2

## 2015-12-11 MED ORDER — SODIUM CHLORIDE 0.9 % IV BOLUS (SEPSIS)
1000.0000 mL | Freq: Once | INTRAVENOUS | Status: AC
Start: 2015-12-11 — End: 2015-12-11
  Administered 2015-12-11: 1000 mL via INTRAVENOUS

## 2015-12-11 MED ORDER — PAROXETINE HCL 10 MG PO TABS
30.0000 mg | ORAL_TABLET | Freq: Every day | ORAL | Status: DC
  Administered 2015-12-11: 30 mg via ORAL
  Filled 2015-12-11 (×5): qty 3

## 2015-12-11 MED ORDER — METOPROLOL TARTRATE 25 MG PO TABS
12.5000 mg | ORAL_TABLET | Freq: Two times a day (BID) | ORAL | Status: DC
  Administered 2015-12-11: 12.5 mg via ORAL
  Filled 2015-12-11: qty 1

## 2015-12-11 MED ORDER — VANCOMYCIN HCL IN DEXTROSE 750-5 MG/150ML-% IV SOLN
750.0000 mg | INTRAVENOUS | Status: DC
  Filled 2015-12-11: qty 150

## 2015-12-11 MED ORDER — SODIUM CHLORIDE 0.9 % IV SOLN
INTRAVENOUS | Status: DC
  Administered 2015-12-11 – 2015-12-12 (×2): 1000 mL via INTRAVENOUS
  Administered 2015-12-12 – 2015-12-15 (×4): via INTRAVENOUS

## 2015-12-11 MED ORDER — ENOXAPARIN SODIUM 30 MG/0.3ML ~~LOC~~ SOLN
30.0000 mg | SUBCUTANEOUS | Status: DC
  Administered 2015-12-11 – 2015-12-12 (×2): 30 mg via SUBCUTANEOUS
  Filled 2015-12-11 (×3): qty 0.3

## 2015-12-11 MED ORDER — SODIUM CHLORIDE 0.9 % IV SOLN
Freq: Once | INTRAVENOUS | Status: AC
  Administered 2015-12-11: 500 mL via INTRAVENOUS

## 2015-12-11 NOTE — ED Notes (Signed)
CRITICAL VALUE ALERT  Critical value received:  Lactic 6.2  Date of notification:  12/11/15  Time of notification:  0810  Critical value read back: yes  Nurse who received alert:  J.Torres Hardenbrook RN  MD notified (1st page):  Zackowski  Time of first page:  [phone] MD notified (2nd page):  Time of second page:  Responding MD:  Rogene Houston  Time MD responded:  [phone]

## 2015-12-11 NOTE — ED Provider Notes (Signed)
CSN: [id number]     Arrival date & time 12/11/15  0414 History   First MD Initiated Contact with Patient 12/11/15 [phone]     Chief Complaint  Patient presents with  . Fever  . Emesis   Level V caveat for altered mental status  (Consider location/radiation/quality/duration/timing/severity/associated sxs/prior Treatment) HPI  patient was sent from the [facility] when they noted she was vomiting and running a fever this morning. Patient is slow to respond, she does not make eye contact and does not seem to know where voices are coming from. About the only thing she can say is she is not having pain.  PCP Dr Linna Darner  Patient is DO NOT RESUSCITATE  Past Medical History  Diagnosis Date  . Breast cancer ([place])   . Essential hypertension, benign   . Depression   . GERD (gastroesophageal reflux disease)   . Hypercholesteremia   . CKD (chronic kidney disease) stage 3, GFR 30-59 ml/min   . Pneumonia   . UTI (lower urinary tract infection)   . Arthritis   . Asthma   . Venous stasis   . Type 2 diabetes mellitus ([place])   . Chronic diastolic heart failure (HCC)     LVEF 70%  . Mitral stenosis     Moderate  . Secondary pulmonary hypertension (HCC)     PASP 64 mmHg  . CHF (congestive heart failure) ([place])   . Cholelithiasis 11/19/2015  . Choledocholithiasis 11/19/2015   Past Surgical History  Procedure Laterality Date  . Replacement total knee    . Mastectomy    . Total hip arthroplasty    . Carotid endarterectomy    . Cataracts     Family History  Problem Relation Age of Onset  . Asthma Other   . Diabetes Other    Social History  Substance Use Topics  . Smoking status: Never Smoker   . Smokeless tobacco: Never Used  . Alcohol Use: No   Lives in NH  OB History    No data available     Review of Systems  Unable to perform ROS: Mental status change      Allergies  Aleve; Carbapenems; Cephalosporins; Codeine; Amoxicillin; and Penicillins  Home Medications   Prior to  Admission medications   Medication Sig Start Date End Date Taking? Authorizing Provider  acetaminophen (TYLENOL) 325 MG tablet Take 650 mg by mouth every 6 (six) hours as needed for mild pain.     Historical Provider, MD  albuterol (PROVENTIL HFA;VENTOLIN HFA) 108 (90 BASE) MCG/ACT inhaler Inhale 2 puffs into the lungs every 6 (six) hours as needed for wheezing or shortness of breath.    Historical Provider, MD  albuterol (PROVENTIL) (2.5 MG/3ML) 0.083% nebulizer solution Take 2.5 mg by nebulization every 6 (six) hours as needed for wheezing or shortness of breath.    Historical Provider, MD  allopurinol (ZYLOPRIM) 100 MG tablet TAKE (2) TABLETS BY MOUTH ONCE DAILY. 08/07/13   Mikey Kirschner, MD  Alum & Mag Hydroxide-Simeth [id number] MG CHEW Chew by mouth. Take by mouth every 4 hours as needed for indigestion or heartburn    Historical Provider, MD  amLODipine (NORVASC) 10 MG tablet Take 10 mg by mouth daily.    Historical Provider, MD  aspirin 325 MG tablet Take 1 tablet (325 mg total) by mouth daily. 11/15/14   Kathie Dike, MD  Cranberry 475 MG CAPS Take 1 capsule by mouth every 12 (twelve) hours.    Historical Provider, MD  Cranberry-Vitamin  C-Inulin (UTI-STAT) LIQD Take 30 mLs by mouth daily.    Historical Provider, MD  diphenhydrAMINE (BENADRYL) 25 MG tablet Take 12.5 mg by mouth every 8 (eight) hours as needed for itching.    Historical Provider, MD  ENSURE (ENSURE) Take 237 mLs by mouth 2 (two) times daily between meals. Please mix with ice cream.    Historical Provider, MD  insulin lispro (HUMALOG) 100 UNIT/ML injection Inject 2-6 units into the skin 3 times  Daily before meals. Sliding scale as follows 200-250=2units, 251-300=4units,301-350=6units, If CBG greater than 350 give 6 units and call Provider.    Historical Provider, MD  magnesium hydroxide (MILK OF MAGNESIA) 400 MG/5ML suspension Take 30 mLs by mouth daily as needed for mild constipation.    Historical Provider, MD  metoprolol  tartrate (LOPRESSOR) 25 MG tablet Take 12.5 mg by mouth 2 (two) times daily. Take one-half tab equal 12.5 mg bid    Historical Provider, MD  PARoxetine (PAXIL) 30 MG tablet Take 30 mg by mouth daily.    Historical Provider, MD  phenol (CHLORASEPTIC) 1.4 % LIQD Use as directed 2 sprays in the mouth or throat as needed for throat irritation / pain.    Historical Provider, MD  Polyethyl Glycol-Propyl Glycol (SYSTANE OP) Place 1 drop into both eyes 2 (two) times daily.    Historical Provider, MD  potassium chloride SA (K-DUR,KLOR-CON) 20 MEQ tablet Take 40 mEq by mouth 2 (two) times daily.     Historical Provider, MD  ranitidine (ZANTAC) 150 MG tablet Take 150 mg by mouth at bedtime.    Historical Provider, MD  Spacer/Aero-Holding Chambers (AEROCHAMBER MV) inhaler by Other route. Use as instructed 2 puffs BID    Historical Provider, MD  torsemide (DEMADEX) 20 MG tablet Take 80 mg by mouth 2 (two) times daily. Takes  80 mg every morning 60 mg every afternoon    Historical Provider, MD   BP 166/71 mmHg  Pulse 92  Temp(Src) 102.7 F (39.3 C)  Resp 28  SpO2 95%  Vital signs normal Except for fever and tachypnea  Physical Exam  Constitutional: She appears well-developed and well-nourished.  Non-toxic appearance. She does not appear ill. No distress.  HENT:  Head: Normocephalic and atraumatic.  Right Ear: External ear normal.  Left Ear: External ear normal.  Nose: Nose normal. No mucosal edema or rhinorrhea.  Mouth/Throat: Oropharynx is clear and moist and mucous membranes are normal. No dental abscesses or uvula swelling.  Eyes: Conjunctivae and EOM are normal. Pupils are equal, round, and reactive to light.  Neck: Normal range of motion and full passive range of motion without pain. Neck supple.  Cardiovascular: Normal rate, regular rhythm and normal heart sounds.  Exam reveals no gallop and no friction rub.   No murmur heard. Pulmonary/Chest: Effort normal and breath sounds normal. No  respiratory distress. She has no wheezes. She has no rhonchi. She has no rales. She exhibits no tenderness and no crepitus.  Abdominal: Soft. Normal appearance and bowel sounds are normal. She exhibits no distension. There is no tenderness. There is no rebound and no guarding.  Musculoskeletal: Normal range of motion. She exhibits no edema or tenderness.  Moves all extremities well.   Neurological: She is alert. She has normal strength. No cranial nerve deficit.  Skin: Skin is warm, dry and intact. No rash noted. No erythema. No pallor.  Hot to touch  Psychiatric: Her speech is delayed. She is slowed.  Nursing note and vitals reviewed.   ED  Course  IO LINE INSERTION Date/Time: 12/11/2015 7:04 AM Performed by: Tomi Bamberger, Ermal Haberer Authorized by: Tomi Bamberger, Vernie Vinciguerra Consent: The procedure was performed in an emergent situation. Verbal consent not obtained. Written consent not obtained. Patient understanding: patient does not state understanding of the procedure being performed Relevant documents: relevant documents present and verified Site marked: the operative site was marked Imaging studies: imaging studies not available Patient identity confirmed: arm band and hospital-assigned identification number Time out: Immediately prior to procedure a "time out" was called to verify the correct patient, procedure, equipment, support staff and site/side marked as required. Indications: fluid administration Local anesthesia used: yes Anesthesia: local infiltration Local anesthetic: lidocaine 2% without epinephrine Patient sedated: no Insertion site: right proximal tibia Site preparation: chlorhexidine Insertion device: drill device Insertion: needle was inserted through the bony cortex Number of attempts: 1 Confirmation method: stability of the needle, easy infusion of fluids and aspiration of blood/marrow Secured with: protective shield Patient tolerance: Patient tolerated the procedure well with no immediate  complications   (including critical care time)  Medications  sodium chloride 0.9 % bolus 1,000 mL (1,000 mLs Intravenous New Bag/Given 12/11/15 0620)    Followed by  sodium chloride 0.9 % bolus 500 mL (not administered)  aztreonam (AZACTAM) 2 g in dextrose 5 % 50 mL IVPB (not administered)  vancomycin (VANCOCIN) IVPB 1000 mg/200 mL premix (not administered)  acetaminophen (TYLENOL) suppository 650 mg (650 mg Rectal Given 12/11/15 0614)  lidocaine (XYLOCAINE) 2 % injection 10 mL (10 mLs Other Given by Other 12/11/15 [id number])   Patient was started on IV fluids, she was given a Tylenol suppository for fever. Nursing staff had difficult time getting IV access in the ones they got did not remain intact.  Patient was started on antibiotics for unknown source since her laboratory results had not resulted. She is noted to have several antibiotic allergies. Due to her borderline prolonged QTc interval she could not be given Levaquin. She was started on vancomycin and Azactam.Patient had urine culture showing over 100,000 colonies of Escherichia coli from her urine on March 17 that was resistant to Cipro and Septra and intermediate to Unasyn and amoxicillin.  06:09 Dr Darrick Meigs, hospitalist, admit to step down  7 AM intraosseous needle was inserted.  Review of patient's records show she was admitted March 17 with sepsis from Escherichia coli from a urinary tract infection.  Labs Review Results for orders placed or performed during the hospital encounter of 12/11/15  Blood Culture (routine x 2)  Result Value Ref Range   Specimen Description RIGHT ANTECUBITAL    Special Requests      BOTTLES DRAWN AEROBIC AND ANAEROBIC 6CC DRAWN BY RN   Culture PENDING    Report Status PENDING   Blood Culture (routine x 2)  Result Value Ref Range   Specimen Description BLOOD RIGHT ARM    Special Requests BOTTLES DRAWN AEROBIC ONLY [place] DRAWN BY RN    Culture PENDING    Report Status PENDING   Comprehensive metabolic  panel  Result Value Ref Range   Sodium 134 (L) 135 - 145 mmol/L   Potassium 4.1 3.5 - 5.1 mmol/L   Chloride 102 101 - 111 mmol/L   CO2 20 (L) 22 - 32 mmol/L   Glucose, Bld 171 (H) 65 - 99 mg/dL   BUN 22 (H) 6 - 20 mg/dL   Creatinine, Ser 1.29 (H) 0.44 - 1.00 mg/dL   Calcium 8.8 (L) 8.9 - 10.3 mg/dL   Total Protein 8.2 (H) 6.5 -  8.1 g/dL   Albumin 3.2 (L) 3.5 - 5.0 g/dL   AST 145 (H) 15 - 41 U/L   ALT 54 14 - 54 U/L   Alkaline Phosphatase 327 (H) 38 - 126 U/L   Total Bilirubin 2.5 (H) 0.3 - 1.2 mg/dL   GFR calc non Af Amer 35 (L) >60 mL/min   GFR calc Af Amer 41 (L) >60 mL/min   Anion gap 12 5 - 15  CBC WITH DIFFERENTIAL  Result Value Ref Range   WBC 22.6 (H) 4.0 - 10.5 K/uL   RBC 4.39 3.87 - 5.11 MIL/uL   Hemoglobin 12.2 12.0 - 15.0 g/dL   HCT 37.6 36.0 - 46.0 %   MCV 85.6 78.0 - 100.0 fL   MCH 27.8 26.0 - 34.0 pg   MCHC 32.4 30.0 - 36.0 g/dL   RDW 14.9 11.5 - 15.5 %   Platelets 317 150 - 400 K/uL   Neutrophils Relative % 97 %   Neutro Abs 21.9 (H) 1.7 - 7.7 K/uL   Lymphocytes Relative 1 %   Lymphs Abs 0.3 (L) 0.7 - 4.0 K/uL   Monocytes Relative 2 %   Monocytes Absolute 0.5 0.1 - 1.0 K/uL   Eosinophils Relative 0 %   Eosinophils Absolute 0.0 0.0 - 0.7 K/uL   Basophils Relative 0 %   Basophils Absolute 0.0 0.0 - 0.1 K/uL  Urinalysis, Routine w reflex microscopic (not at [facility])  Result Value Ref Range   Color, Urine YELLOW YELLOW   APPearance CLOUDY (A) CLEAR   Specific Gravity, Urine 1.010 1.005 - 1.030   pH 5.5 5.0 - 8.0   Glucose, UA NEGATIVE NEGATIVE mg/dL   Hgb urine dipstick LARGE (A) NEGATIVE   Bilirubin Urine SMALL (A) NEGATIVE   Ketones, ur NEGATIVE NEGATIVE mg/dL   Protein, ur 100 (A) NEGATIVE mg/dL   Nitrite POSITIVE (A) NEGATIVE   Leukocytes, UA LARGE (A) NEGATIVE  Lactic acid, plasma  Result Value Ref Range   Lactic Acid, Venous 4.1 (HH) 0.5 - 2.0 mmol/L  Troponin I  Result Value Ref Range   Troponin I 0.06 (H) <0.031 ng/mL  Urine microscopic-add  on  Result Value Ref Range   Squamous Epithelial / LPF NONE SEEN NONE SEEN   WBC, UA TOO NUMEROUS TO COUNT 0 - 5 WBC/hpf   RBC / HPF TOO NUMEROUS TO COUNT 0 - 5 RBC/hpf   Bacteria, UA MANY (A) NONE SEEN   Urine-Other YEAST    Laboratory interpretation all normal except UTI, positive lactic acidosis, positive troponin, leukocytosis, stable renal insufficiency, normal anion gap  Specimen Description URINE, CATHETERIZED   Special Requests NONE   Culture >=100,000 COLONIES/mL ESCHERICHIA COLI  Performed at [facility]       Report Status 11/20/2015 FINAL   Organism ID, Bacteria ESCHERICHIA COLI   Resulting Agency [facility]    Culture & Susceptibility      ESCHERICHIA COLI     Antibiotic Sensitivity Microscan Status    AMPICILLIN Intermediate 16 INTERMEDIATE Final    Method: MIC    AMPICILLIN/SULBACTAM Intermediate 4 INTERMEDIATE Final    Method: MIC    CEFAZOLIN Sensitive <=4 SENSITIVE Final    Method: MIC    CEFTRIAXONE Sensitive <=1 SENSITIVE Final    Method: MIC    CIPROFLOXACIN Resistant >=4 RESISTANT Final    Method: MIC    GENTAMICIN Sensitive <=1 SENSITIVE Final    Method: MIC    IMIPENEM Sensitive <=0.25 SENSITIVE Final    Method: MIC  NITROFURANTOIN Sensitive <=16 SENSITIVE Final    Method: MIC    PIP/TAZO Sensitive <=4 SENSITIVE Final    Method: MIC    TRIMETH/SULFA Resistant >=320 RESISTANT Final    Method: MIC    Comments ESCHERICHIA COLI (MIC)    >=100,000 COLONIES/mL ESCHERICHIA COLI               Specimen Collected: 11/17/15 11:50 PM Last Resulted: 11/20/15 12:57 PM          Imaging Review Dg Chest 1 View  12/11/2015  CLINICAL DATA:  Nausea, vomiting and fever for 1 day. History of breast cancer, hypertension, chronic kidney disease and pneumonia. EXAM: CHEST 1 VIEW COMPARISON:  Chest radiograph November 17, 2015 FINDINGS: The cardiac silhouette is mildly enlarged unchanged. Patient  is rotated to the RIGHT, accentuating the mediastinum. Mediastinal and hilar calcified lymph nodes again noted. Mild prominence of the bronchovascular markings without pleural effusion or focal consolidation. Persistently mildly elevated RIGHT hemidiaphragm. No pneumothorax. Surgical clips in LEFT breast. Severe degenerative change of the RIGHT shoulder, moderate on the LEFT. IMPRESSION: Prominence of the bronchovascular markings can be seen with vascular congestion or bronchitis without focal consolidation. Mild cardiomegaly. Electronically Signed   By: Elon Alas M.D.   On: 12/11/2015 05:45     Ct Abdomen Pelvis Wo Contrast  11/18/2015  CLINICAL DATA:  Acute onset of fever and leukocytosis. Confusion. Left lower quadrant abdominal pain. Initial encounter.aged but appears grossly unremarkable. Multilevel vacuum phenomenon is noted along the lumbar spine. IMPRESSION: 1. Apparent complex 4.1 cm mass at the lower pole of the right kidney. This is concerning for renal cell carcinoma. Renal ultrasound would be helpful for further evaluation, as deemed clinically appropriate. 2. Cholelithiasis.  Gallbladder otherwise unremarkable. 3. Mild bibasilar atelectasis or scarring noted. 4. Calcification noted at the mitral valve. 5. Right renal cyst noted. 6. Scattered calcification along the abdominal aorta and its branches. 7. Mild diverticulosis along the mid sigmoid colon. 8. Mildly calcified uterine fibroid noted. 9. Mild degenerative change along the lumbar spine. Electronically Signed   By: Garald Balding M.D.   On: 11/18/2015 02:47   Dg Chest 2 View  11/17/2015  CLINICAL DATA:  Fever.  Altered mental status. . IMPRESSION: 1. No acute abnormality. 2. Stable cardiomegaly and chronic bronchitic changes. Electronically Signed   By: Claudie Revering M.D.   On: 11/17/2015 20:08   US Renal  11/18/2015  CLINICAL DATA:  Right renal mass seen on CT scan.. IMPRESSION: 1. The lower pole right renal mass seen on recent  CT imaging is heterogeneous in appearance today and appears to have internal blood flow. This is highly suspicious for a renal cell carcinoma. A contrast-enhanced MRI or CT would be the most definitive imaging studies for complete evaluation. Electronically Signed   By: Dorise Bullion III M.D   On: 11/18/2015 12:17   Dg Chest Port 1 View  11/17/2015  CLINICAL DATA:  Acute onset of fever and shortness of breath. Code sepsis. Initial encounter.IMPRESSION: Lungs mildly hypoexpanded. Mild chronic peribronchial thickening, with minimal bilateral atelectasis. Electronically Signed   By: Garald Balding M.D.   On: 11/17/2015 23:55   US Abdomen Limited Ruq  11/19/2015  CLINICAL DATA:  Elevated liver enzymes.  History of breast carcinoma  IMPRESSION: Cholelithiasis.  Study otherwise unremarkable. Electronically Signed   By: Lowella Grip III M.D.   On: 11/19/2015 11:49   I have personally reviewed and evaluated these images and lab results as part of my medical  decision-making.   EKG Interpretation   Date/Time:  Monday December 11 2015 04:57:47 EDT Ventricular Rate:  102 PR Interval:  145 QRS Duration: 93 QT Interval:  379 QTC Calculation: 494 R Axis:   65 Text Interpretation:  Sinus tachycardia Minimal ST depression,  anterolateral leads Borderline prolonged QT interval Since last tracing  rate faster (17 Nov 2015) Confirmed by Kiwanna Spraker  MD-I, Jasiyah Poland ([id number]) on  12/11/2015 5:44:04 AM      MDM   Final diagnoses:  Fever, unspecified fever cause  Confusion  UTI (lower urinary tract infection)  Lactic acidosis    Plan admission   Rolland Porter, MD, Barbette Or, MD 12/11/15 [phone]

## 2015-12-11 NOTE — Progress Notes (Signed)
CRITICAL VALUE ALERT  Critical value received:  Blood cultures positive for gram negative rods.  Date of notification:  12/11/2015  Time of notification:  [id number]  Critical value read back: yes  Nurse who received alert:  Collene Leyden, RN  MD notified (1st page):  Dr. Jerilee Hoh  Time of first page:  [phone]  MD notified (2nd page):  Time of second page:  Responding MD:  Dr. Jerilee Hoh  Time MD responded:  1800, MD to unit to see patient.

## 2015-12-11 NOTE — ED Notes (Signed)
Pt is a very hard stick. 1st IV was successful and then infiltrated. Pt was stuck multiple times finally getting a 24G 0.56 around 0605.

## 2015-12-11 NOTE — Progress Notes (Signed)
Nutrition Follow up   INTERVENTION:  Follow for healthcare decisions   NUTRITION DIAGNOSIS:  Increased nutrient needs related to chronic respiratory disease AEB est needs per nutrition guidelines  GOAL:  Honor pt wishes for progression of care     MONITOR:   meal intake, weights and acceptance of supplements   REASON FOR ASSESSMENT: Malnutrition Screen      ASSESSMENT: Pt is from [facility]. Recent hx of severe malnutrition (11/18/15) RD note. Pt has been declining over the past few months. In March she was hospitalized due to sepsis. Her weight has been up and down between 170-200# the past 4 months. Current weight shows a decrease of 4.9% in 30 days and 9.9% in 90 days.  Pt po intake of meals and supplements historically has been variable. She is been receiving Ensure Enlive BID and Dysphgia 3 /Consistent Cho diet. Pt is not responding to questions and is unable to participate in full nutrition focused exam-however she has mild-moderate loss of upper body muscle and fat mass.  Pt/POA choose to complete MOST form in late March (11/28/15) which included no hospitalization, no labs and comfort care. There is a follow-up palliative consult pending now.   Abnormal labs: lactic acid 6.2, Troponin I- 0.09, glucose 137.   Diet Order:  Diet NPO time specified  Skin:   intact  Last BM:   prior to admission  Height:   Ht Readings from Last 1 Encounters:  12/11/15 5\' 3"  (1.6 m)    Weight:   Wt Readings from Last 1 Encounters:  12/11/15 173 lb 1 oz (78.5 kg)    Ideal Body Weight:   52.2 kg  BMI:  Body mass index is 30.66 kg/(m^2). obese  Estimated Nutritional Needs:   Kcal:   [id number]  Protein:   72-83 gr  Fluid:   1.6-1.8 liters daily  EDUCATION NEEDS: none indicated at this time   Colman Cater MS,RD,CSG,LDN Office: [phone] Pager: [phone]

## 2015-12-11 NOTE — Progress Notes (Signed)
Pharmacy Antibiotic Note  [name] is a 82 y.o. female admitted on 12/11/2015 with sepsis.  Pharmacy has been consulted for [facility] AND AZTREONAM dosing.  Plan:  Vancomycin 750mg  IV q24hrs  Check trough at steady state  Aztreonam 1gm IV q8h  Monitor labs, renal fxn, progress and c/s  Deescalate ABX when appropriate  Height: 5\' 3"  (160 cm) Weight: 173 lb 1 oz (78.5 kg) IBW/kg (Calculated) : 52.4  Temp (24hrs), Avg:101.4 F (38.6 C), Min:98.3 F (36.8 C), Max:103.4 F (39.7 C)   Recent Labs Lab 12/11/15 0500 12/11/15 0745  WBC 22.6*  --   CREATININE 1.29*  --   LATICACIDVEN 4.1* 6.2*    Estimated Creatinine Clearance: 28.2 mL/min (by C-G formula based on Cr of 1.29).    Allergies  Allergen Reactions  . Aleve [Naproxen Sodium]   . Carbapenems Other (See Comments)    unknown  . Cephalosporins Other (See Comments)    Unknown/ pt has had rocephin in the past and tolerated  . Codeine Nausea Only  . Amoxicillin Rash  . Penicillins Rash    Antimicrobials this admission: Vancomycin 4/10 >>  Aztreonam 4/10 >>   Results for orders placed or performed during the hospital encounter of 12/11/15  Blood Culture (routine x 2)     Status: None (Preliminary result)   Collection Time: 12/11/15  5:00 AM  Result Value Ref Range Status   Specimen Description RIGHT ANTECUBITAL  Final   Special Requests   Final    BOTTLES DRAWN AEROBIC AND ANAEROBIC 6CC DRAWN BY RN   Culture NO GROWTH < 12 HOURS  Final   Report Status PENDING  Incomplete  Blood Culture (routine x 2)     Status: None (Preliminary result)   Collection Time: 12/11/15  5:05 AM  Result Value Ref Range Status   Specimen Description BLOOD RIGHT ARM  Final   Special Requests BOTTLES DRAWN AEROBIC ONLY [place] DRAWN BY RN  Final   Culture NO GROWTH < 12 HOURS  Final   Report Status PENDING  Incomplete   Thank you for allowing pharmacy to be a part of this patient's care.  Hart Robinsons A 12/11/2015 11:40  AM

## 2015-12-11 NOTE — ED Notes (Signed)
CRITICAL VALUE ALERT  Critical value received:  Lactic Acid Date of notification: 12/11/15 Time of notification:  [id number] Critical value read back:Yes.    Nurse who received alert:  GMP MD notified (1st page): Dr. Tomi Bamberger Time of first page:  0540 Responding MD:Dr. Tomi Bamberger Time MD responded:  [phone]

## 2015-12-11 NOTE — Consult Note (Addendum)
Reason for Consult: evaluate sepsis, gallstones elevated liver function tests.  Referring Physician: Isaac Bliss, MD  [place]   .  HPI: This is a 82 year old Caucasian female who is admitted to the internal medicine service with fever of 103, vomiting, elevated liver function tests and known gallstones.  She is a resident of [facility] skilled nursing and her health has been in decline over the past several months.  She has significant comorbidities including history of breast cancer, hypertension, depression, GERD, chronic kidney disease, recurrent urinary tract infections, venous stasis disease, diabetes, chronic diastolic heart failure, mitral stenosis, secondary pulmonary hypertension, known gallstones, and abnormal liver function tests.  She was hospitalized at [facility] about 3 weeks ago with sepsis, lactic acidosis, and mildly elevated liver function tests.  She was found to have Escherichia coli bacteremia secondary to Escherichia coli UTI.  Her notes at that time documented gallstones and the elevated liver function test were attributed to possibly passing a common duct stone but physical exams of the abdomen were unremarkable and no abdominal pain was reported.  She was noted to have a right renal mass consistent with renal cell carcinoma and the family decided to do nothing about that.  Following that hospitalization she was very weak and unable to feed herself.  She verbalized only occasionally.  She is incontinent.  She was sent to the hospital from the skilled nursing facility early this morning for fever of 103, heart rate 100, normotensive.  and vomiting.  She could not provide any history.  Lab work shows WBC 22,600, hemoglobin 12.2, lactic acid 6.2, total bilirubin 2.5, AST 145, ALT 54, alkaline phosphatase 327.      Ultrasound shows sludge and stones in the gallbladder.  Gallbladder wall is borderline thickened at 3-4 mm but no pericholecystic fluid and no  clear-cut diagnostic findings of acute inflammation.  Common bile duct is not dilated on a 4-5 mm diameter.  No focal lesion in the liver     2 of her daughters are here and a third one is driving in.  Consultations have been put into general surgery and palliative care medicine.  Past Medical History  Diagnosis Date  . Breast cancer ([place])   . Essential hypertension, benign   . Depression   . GERD (gastroesophageal reflux disease)   . Hypercholesteremia   . CKD (chronic kidney disease) stage 3, GFR 30-59 ml/min   . Pneumonia   . UTI (lower urinary tract infection)   . Arthritis   . Asthma   . Venous stasis   . Type 2 diabetes mellitus ([place])   . Chronic diastolic heart failure (HCC)     LVEF 70%  . Mitral stenosis     Moderate  . Secondary pulmonary hypertension (HCC)     PASP 64 mmHg  . CHF (congestive heart failure) ([place])   . Cholelithiasis 11/19/2015  . Choledocholithiasis 11/19/2015    Past Surgical History  Procedure Laterality Date  . Replacement total knee    . Mastectomy    . Total hip arthroplasty    . Carotid endarterectomy    . Cataracts      Family History  Problem Relation Age of Onset  . Asthma Other   . Diabetes Other     Social History:  reports that she has never smoked. She has never used smokeless tobacco. She reports that she does not drink alcohol or use illicit drugs.  Allergies:  Allergies  Allergen Reactions  . Aleve [  Naproxen Sodium]   . Carbapenems Other (See Comments)    unknown  . Cephalosporins Other (See Comments)    Unknown/ pt has had rocephin in the past and tolerated  . Codeine Nausea Only  . Amoxicillin Rash  . Penicillins Rash    Medications:  Results for orders placed or performed during the hospital encounter of 12/11/15 (from the past 48 hour(s))  Comprehensive metabolic panel     Status: Abnormal   Collection Time: 12/11/15  5:00 AM  Result Value Ref Range   Sodium 134 (L) 135 - 145 mmol/L   Potassium 4.1 3.5 - 5.1  mmol/L   Chloride 102 101 - 111 mmol/L   CO2 20 (L) 22 - 32 mmol/L   Glucose, Bld 171 (H) 65 - 99 mg/dL   BUN 22 (H) 6 - 20 mg/dL   Creatinine, Ser 1.29 (H) 0.44 - 1.00 mg/dL   Calcium 8.8 (L) 8.9 - 10.3 mg/dL   Total Protein 8.2 (H) 6.5 - 8.1 g/dL   Albumin 3.2 (L) 3.5 - 5.0 g/dL   AST 145 (H) 15 - 41 U/L   ALT 54 14 - 54 U/L   Alkaline Phosphatase 327 (H) 38 - 126 U/L   Total Bilirubin 2.5 (H) 0.3 - 1.2 mg/dL   GFR calc non Af Amer 35 (L) >60 mL/min   GFR calc Af Amer 41 (L) >60 mL/min    Comment: (NOTE) The eGFR has been calculated using the CKD EPI equation. This calculation has not been validated in all clinical situations. eGFR's persistently <60 mL/min signify possible Chronic Kidney Disease.    Anion gap 12 5 - 15  CBC WITH DIFFERENTIAL     Status: Abnormal   Collection Time: 12/11/15  5:00 AM  Result Value Ref Range   WBC 22.6 (H) 4.0 - 10.5 K/uL   RBC 4.39 3.87 - 5.11 MIL/uL   Hemoglobin 12.2 12.0 - 15.0 g/dL   HCT 37.6 36.0 - 46.0 %   MCV 85.6 78.0 - 100.0 fL   MCH 27.8 26.0 - 34.0 pg   MCHC 32.4 30.0 - 36.0 g/dL   RDW 14.9 11.5 - 15.5 %   Platelets 317 150 - 400 K/uL   Neutrophils Relative % 97 %   Neutro Abs 21.9 (H) 1.7 - 7.7 K/uL   Lymphocytes Relative 1 %   Lymphs Abs 0.3 (L) 0.7 - 4.0 K/uL   Monocytes Relative 2 %   Monocytes Absolute 0.5 0.1 - 1.0 K/uL   Eosinophils Relative 0 %   Eosinophils Absolute 0.0 0.0 - 0.7 K/uL   Basophils Relative 0 %   Basophils Absolute 0.0 0.0 - 0.1 K/uL  Blood Culture (routine x 2)     Status: None (Preliminary result)   Collection Time: 12/11/15  5:00 AM  Result Value Ref Range   Specimen Description RIGHT ANTECUBITAL    Special Requests      BOTTLES DRAWN AEROBIC AND ANAEROBIC 6CC DRAWN BY RN   Culture NO GROWTH < 12 HOURS    Report Status PENDING   Lactic acid, plasma     Status: Abnormal   Collection Time: 12/11/15  5:00 AM  Result Value Ref Range   Lactic Acid, Venous 4.1 (HH) 0.5 - 2.0 mmol/L    Comment:  CRITICAL RESULT CALLED TO, READ BACK BY AND VERIFIED WITH: PRUITT,G AT 5:40AM ON 12/11/15 BY FESTERMAN,C   Troponin I     Status: Abnormal   Collection Time: 12/11/15  5:00 AM  Result  Value Ref Range   Troponin I 0.06 (H) <0.031 ng/mL    Comment:        PERSISTENTLY INCREASED TROPONIN VALUES IN THE RANGE OF 0.04-0.49 ng/mL CAN BE SEEN IN:       -UNSTABLE ANGINA       -CONGESTIVE HEART FAILURE       -MYOCARDITIS       -CHEST TRAUMA       -ARRYHTHMIAS       -LATE PRESENTING MYOCARDIAL INFARCTION       -COPD   CLINICAL FOLLOW-UP RECOMMENDED.   Blood Culture (routine x 2)     Status: None (Preliminary result)   Collection Time: 12/11/15  5:05 AM  Result Value Ref Range   Specimen Description BLOOD RIGHT ARM    Special Requests BOTTLES DRAWN AEROBIC ONLY [place] DRAWN BY RN    Culture NO GROWTH < 12 HOURS    Report Status PENDING   Urinalysis, Routine w reflex microscopic (not at [facility])     Status: Abnormal   Collection Time: 12/11/15  6:27 AM  Result Value Ref Range   Color, Urine YELLOW YELLOW   APPearance CLOUDY (A) CLEAR   Specific Gravity, Urine 1.010 1.005 - 1.030   pH 5.5 5.0 - 8.0   Glucose, UA NEGATIVE NEGATIVE mg/dL   Hgb urine dipstick LARGE (A) NEGATIVE   Bilirubin Urine SMALL (A) NEGATIVE   Ketones, ur NEGATIVE NEGATIVE mg/dL   Protein, ur 100 (A) NEGATIVE mg/dL   Nitrite POSITIVE (A) NEGATIVE   Leukocytes, UA LARGE (A) NEGATIVE  Urine microscopic-add on     Status: Abnormal   Collection Time: 12/11/15  6:27 AM  Result Value Ref Range   Squamous Epithelial / LPF NONE SEEN NONE SEEN   WBC, UA TOO NUMEROUS TO COUNT 0 - 5 WBC/hpf   RBC / HPF TOO NUMEROUS TO COUNT 0 - 5 RBC/hpf   Bacteria, UA MANY (A) NONE SEEN   Urine-Other YEAST   Lactic acid, plasma     Status: Abnormal   Collection Time: 12/11/15  7:45 AM  Result Value Ref Range   Lactic Acid, Venous 6.2 (HH) 0.5 - 2.0 mmol/L    Comment: CRITICAL RESULT CALLED TO, READ BACK BY AND VERIFIED WITH: YOUNG,J AT  8:10AM ON 12/11/15 BY FESTERMAN,C   Lipase, blood     Status: None   Collection Time: 12/11/15  7:45 AM  Result Value Ref Range   Lipase 17 11 - 51 U/L  Troponin I (q 6hr x 3)     Status: Abnormal   Collection Time: 12/11/15  9:27 AM  Result Value Ref Range   Troponin I 0.09 (H) <0.031 ng/mL    Comment:        PERSISTENTLY INCREASED TROPONIN VALUES IN THE RANGE OF 0.04-0.49 ng/mL CAN BE SEEN IN:       -UNSTABLE ANGINA       -CONGESTIVE HEART FAILURE       -MYOCARDITIS       -CHEST TRAUMA       -ARRYHTHMIAS       -LATE PRESENTING MYOCARDIAL INFARCTION       -COPD   CLINICAL FOLLOW-UP RECOMMENDED.   Glucose, capillary     Status: Abnormal   Collection Time: 12/11/15 11:31 AM  Result Value Ref Range   Glucose-Capillary 137 (H) 65 - 99 mg/dL   Comment 1 Notify RN     Dg Chest 1 View  12/11/2015  CLINICAL DATA:  Nausea, vomiting and fever  for 1 day. History of breast cancer, hypertension, chronic kidney disease and pneumonia. EXAM: CHEST 1 VIEW COMPARISON:  Chest radiograph November 17, 2015 FINDINGS: The cardiac silhouette is mildly enlarged unchanged. Patient is rotated to the RIGHT, accentuating the mediastinum. Mediastinal and hilar calcified lymph nodes again noted. Mild prominence of the bronchovascular markings without pleural effusion or focal consolidation. Persistently mildly elevated RIGHT hemidiaphragm. No pneumothorax. Surgical clips in LEFT breast. Severe degenerative change of the RIGHT shoulder, moderate on the LEFT. IMPRESSION: Prominence of the bronchovascular markings can be seen with vascular congestion or bronchitis without focal consolidation. Mild cardiomegaly. Electronically Signed   By: Elon Alas M.D.   On: 12/11/2015 05:45   US Abdomen Limited Ruq  12/11/2015  CLINICAL DATA:  Vomiting and fever with known gallstones. EXAM: US ABDOMEN LIMITED - RIGHT UPPER QUADRANT COMPARISON:  11/19/2015 FINDINGS: Gallbladder: Layering sludge noted in the lumen of the  gallbladder with shadowing stones evident, measuring up to 1.5 cm diameter. Gallbladder wall is mildly thickened at 3-4 mm. No pericholecystic fluid. Common bile duct: Diameter: 4-5 mm Liver: No focal lesion identified. Within normal limits in parenchymal echogenicity. IMPRESSION: Gallbladder sludge with cholelithiasis. Gallbladder wall appears mildly thickened at 3-4 mm. Acute cholecystitis is a consideration. If clinical picture is equivocal, nuclear scintigraphy may prove helpful to further evaluate. Electronically Signed   By: Misty Stanley M.D.   On: 12/11/2015 08:29    ROS Blood pressure 111/57, pulse 91, temperature 99.5 F (37.5 C), temperature source Oral, resp. rate 22, height _0  (1.6 m), weight 78.5 kg (173 lb 1 oz), SpO2 93 %. Physical Exam   General: Elderly woman lying in bed.  Eyes are shut.  Unlabored breathing.  Does not verbalize.  Her daughters can get her to set up a little and answer yes or no but I cannot. Vital signs: Temp 99.5.  Was 103.4.  Heart rate 91.  Respirations 22.  BP 111/57.  SPO2 93%. Head: Normocephalic.  No trauma. Mouth: Mucous membranes moist. Neck supple.  No rigidity.  No pathologic mass Heart: Regular rate and rhythm.  No ectopy.  No murmur Lungs: Basically clear to auscultation anteriorly.  No wheeze Abdomen: Soft and nondistended.  She does seem to guard and there does seem to be a fullness in the right upper quadrant.  No discrete mass.  Left upper quadrant is soft.  Difficult to evaluate because of mental status. Neurological: Obtunded.  Arouses to vigorous stimuli.  Does not follow commands. Extremities: Reasonable passive range of motion.  Does not follow commands or perform any active range of motion to command   Assessment:    Sepsis.  Fever, lactic acidosis, leukocytosis Source not clear.  Acute cholecystitis possible.  Recurrent urinary tract infection possible. Agree with antibiotics and resuscitation in the  short-term  Cholelithiasis and elevated LFTs.  Acute cholecystitis possible.  Cholangitis with choledocholithiasis possible.  Physical exam more suggestive of acute cholecystitis than cholangitis, but her gallbladder ultrasound is not dramatically abnormal. Bowel rest Antibiotics  Dementia History multiple TIAs Right renal mass, suspect renal cell carcinoma Chronic diastolic congestive heart failure Pulmonary hypertension Diabetes mellitus  DNR   Recommendation: I had a very long talk with 2 of the 3 daughters and the third daughter was listening in on the phone.  They are aware that acute cholecystitis or cholangitis  is a possible cause of her sepsis, and we had a long talk about options as well as goals of care.  They are aware that  comfort care/ palliative care  is one reasonable option considering her ongoing health decline.  They are aware that her abdominal pain and tenderness might be due to acute cholecystitis and I discussed the option of percutaneous cholecystostomy tube which can be performed by interventional radiology, but only if her nuclear medicine biliary scan shows nonvisualization of the gallbladder, confirming acute cholecystitis.  I told them that I was unsure what that would make a significant difference in the short-term, and certainly would not make any long-term difference in her quality of life.  I told them that she is not a candidate for general anesthesia and cholecystectomy.      They are aware that if she has gangrenous cholecystitis, even percutaneous drainage is unlikely to help .      At this point in time they are still struggling with decision as to how far to go.  They are very reasonable and are just trying to do the right thing.  They do not want me to do the biliary scan at this point in time, which is appropriate.  I would only do that to clarify her diagnosis and as a  prelude to a percutaneous gallbladder drainage.      I think that the next step in her  care is a family conversation with palliative care and internal medicine.     I will be available as needed.   Edsel Petrin. Dalbert Batman, M.D., [facility] General and Minimally invasive Surgery Breast and Colorectal Surgery Office:   [phone] Pager:   [phone]  12/11/2015, 3:42 PM

## 2015-12-11 NOTE — ED Notes (Signed)
Pt's only IV has infiltrated. EDP aware as well as hospitalist.

## 2015-12-11 NOTE — H&P (Addendum)
PCP:   [name], MD   Chief Complaint:  Fever, vomiting  HPI: 82 year old female who   has a past medical history of Breast cancer ([place]); Essential hypertension, benign; Depression; GERD (gastroesophageal reflux disease); Hypercholesteremia; CKD (chronic kidney disease) stage 3, GFR 30-59 ml/min; Pneumonia; UTI (lower urinary tract infection); Arthritis; Asthma; Venous stasis; Type 2 diabetes mellitus ([place]); Chronic diastolic heart failure ([place]); Mitral stenosis; Secondary pulmonary hypertension (HCC); CHF (congestive heart failure) ([place]); Cholelithiasis (11/19/2015); and Choledocholithiasis (11/19/2015). Today was sent to the hospital from skilled nursing facility for fever and vomiting. Patient unable to provide any history. She was recently discharged from the hospital on 11/20/2015 after she was treated for UTI and bacteremia. At that time patient also had abnormal LFTs which improved suggesting transient choledocholethiasis. Today AST is 145 and total bili 2.5. In the ED patient was found to have WBC 22,000, lactic acid 4.1, troponin 0.06. UA and culture are pending at this time.   Allergies:   Allergies  Allergen Reactions  . Aleve [Naproxen Sodium]   . Carbapenems Other (See Comments)    unknown  . Cephalosporins Other (See Comments)    Unknown/ pt has had rocephin in the past and tolerated  . Codeine Nausea Only  . Amoxicillin Rash  . Penicillins Rash      Past Medical History  Diagnosis Date  . Breast cancer ([place])   . Essential hypertension, benign   . Depression   . GERD (gastroesophageal reflux disease)   . Hypercholesteremia   . CKD (chronic kidney disease) stage 3, GFR 30-59 ml/min   . Pneumonia   . UTI (lower urinary tract infection)   . Arthritis   . Asthma   . Venous stasis   . Type 2 diabetes mellitus ([place])   . Chronic diastolic heart failure (HCC)     LVEF 70%  . Mitral stenosis     Moderate  . Secondary pulmonary hypertension (HCC)     PASP 64  mmHg  . CHF (congestive heart failure) ([place])   . Cholelithiasis 11/19/2015  . Choledocholithiasis 11/19/2015    Past Surgical History  Procedure Laterality Date  . Replacement total knee    . Mastectomy    . Total hip arthroplasty    . Carotid endarterectomy    . Cataracts      Prior to Admission medications   Medication Sig Start Date End Date Taking? Authorizing Provider  acetaminophen (TYLENOL) 325 MG tablet Take 650 mg by mouth every 6 (six) hours as needed for mild pain.     Historical Provider, MD  albuterol (PROVENTIL HFA;VENTOLIN HFA) 108 (90 BASE) MCG/ACT inhaler Inhale 2 puffs into the lungs every 6 (six) hours as needed for wheezing or shortness of breath.    Historical Provider, MD  albuterol (PROVENTIL) (2.5 MG/3ML) 0.083% nebulizer solution Take 2.5 mg by nebulization every 6 (six) hours as needed for wheezing or shortness of breath.    Historical Provider, MD  allopurinol (ZYLOPRIM) 100 MG tablet TAKE (2) TABLETS BY MOUTH ONCE DAILY. 08/07/13   Mikey Kirschner, MD  Alum & Mag Hydroxide-Simeth [id number] MG CHEW Chew by mouth. Take by mouth every 4 hours as needed for indigestion or heartburn    Historical Provider, MD  amLODipine (NORVASC) 10 MG tablet Take 10 mg by mouth daily.    Historical Provider, MD  aspirin 325 MG tablet Take 1 tablet (325 mg total) by mouth daily. 11/15/14   Kathie Dike, MD  Cranberry 475 MG CAPS Take  1 capsule by mouth every 12 (twelve) hours.    Historical Provider, MD  Cranberry-Vitamin C-Inulin (UTI-STAT) LIQD Take 30 mLs by mouth daily.    Historical Provider, MD  diphenhydrAMINE (BENADRYL) 25 MG tablet Take 12.5 mg by mouth every 8 (eight) hours as needed for itching.    Historical Provider, MD  ENSURE (ENSURE) Take 237 mLs by mouth 2 (two) times daily between meals. Please mix with ice cream.    Historical Provider, MD  insulin lispro (HUMALOG) 100 UNIT/ML injection Inject 2-6 units into the skin 3 times  Daily before meals. Sliding scale as  follows 200-250=2units, 251-300=4units,301-350=6units, If CBG greater than 350 give 6 units and call Provider.    Historical Provider, MD  magnesium hydroxide (MILK OF MAGNESIA) 400 MG/5ML suspension Take 30 mLs by mouth daily as needed for mild constipation.    Historical Provider, MD  metoprolol tartrate (LOPRESSOR) 25 MG tablet Take 12.5 mg by mouth 2 (two) times daily. Take one-half tab equal 12.5 mg bid    Historical Provider, MD  PARoxetine (PAXIL) 30 MG tablet Take 30 mg by mouth daily.    Historical Provider, MD  phenol (CHLORASEPTIC) 1.4 % LIQD Use as directed 2 sprays in the mouth or throat as needed for throat irritation / pain.    Historical Provider, MD  Polyethyl Glycol-Propyl Glycol (SYSTANE OP) Place 1 drop into both eyes 2 (two) times daily.    Historical Provider, MD  potassium chloride SA (K-DUR,KLOR-CON) 20 MEQ tablet Take 40 mEq by mouth 2 (two) times daily.     Historical Provider, MD  ranitidine (ZANTAC) 150 MG tablet Take 150 mg by mouth at bedtime.    Historical Provider, MD  Spacer/Aero-Holding Chambers (AEROCHAMBER MV) inhaler by Other route. Use as instructed 2 puffs BID    Historical Provider, MD  torsemide (DEMADEX) 20 MG tablet Take 80 mg by mouth 2 (two) times daily. Takes  80 mg every morning 60 mg every afternoon    Historical Provider, MD    Social History:  reports that she has never smoked. She has never used smokeless tobacco. She reports that she does not drink alcohol or use illicit drugs.  Family History  Problem Relation Age of Onset  . Asthma Other   . Diabetes Other       Review of Systems:  Unobtainable as patient    Physical Exam: Blood pressure 166/71, pulse 92, temperature 103.4 F (39.7 C), temperature source Rectal, resp. rate 28, SpO2 95 %. Constitutional:   Patient is a well-developed and well-nourished  in no acute distress and cooperative with exam. Head: Normocephalic and atraumatic Mouth: Mucus membranes moist Neck: Supple, No  Thyromegaly Cardiovascular: RRR, S1 normal, S2 normal Pulmonary/Chest: CTAB, no wheezes, rales, or rhonchi Abdominal: Soft. Non-tender, non-distended, bowel sounds are normal, no masses, organomegaly, or guarding present.  Neurological: Alert but not oriented  Extremities : No Cyanosis, Clubbing or Edema  Labs on Admission:  Basic Metabolic Panel:  Recent Labs Lab 12/11/15 0500  NA 134*  K 4.1  CL 102  CO2 20*  GLUCOSE 171*  BUN 22*  CREATININE 1.29*  CALCIUM 8.8*   Liver Function Tests:  Recent Labs Lab 12/11/15 0500  AST 145*  ALT 54  ALKPHOS 327*  BILITOT 2.5*  PROT 8.2*  ALBUMIN 3.2*   CBC:  Recent Labs Lab 12/11/15 0500  WBC 22.6*  NEUTROABS 21.9*  HGB 12.2  HCT 37.6  MCV 85.6  PLT 317   Cardiac Enzymes:  Recent  Labs Lab 12/11/15 0500  TROPONINI 0.06*     Radiological Exams on Admission: Dg Chest 1 View  12/11/2015  CLINICAL DATA:  Nausea, vomiting and fever for 1 day. History of breast cancer, hypertension, chronic kidney disease and pneumonia. EXAM: CHEST 1 VIEW COMPARISON:  Chest radiograph November 17, 2015 FINDINGS: The cardiac silhouette is mildly enlarged unchanged. Patient is rotated to the RIGHT, accentuating the mediastinum. Mediastinal and hilar calcified lymph nodes again noted. Mild prominence of the bronchovascular markings without pleural effusion or focal consolidation. Persistently mildly elevated RIGHT hemidiaphragm. No pneumothorax. Surgical clips in LEFT breast. Severe degenerative change of the RIGHT shoulder, moderate on the LEFT. IMPRESSION: Prominence of the bronchovascular markings can be seen with vascular congestion or bronchitis without focal consolidation. Mild cardiomegaly. Electronically Signed   By: Elon Alas M.D.   On: 12/11/2015 05:45    EKG: Independently reviewed. Sinus tachycardia   Assessment/Plan Active Problems:   Type 2 diabetes with nephropathy ([place])   Benign renovascular hypertension   Fever    Sepsis ([place])   Sepsis Patient presenting with fever, lactic acidosis, leukocytosis ,sepsis, no clear source at this time. Continue vancomycin and Azactam Follow UA and culture results  Transaminitis / ? Cholecystitis Patient was diagnosed with cholelithiasis and previous admission, and had transient choledocholithiasis Today again AST is 145 with total bili 2.5. Alkaline phosphatase 327 Will keep patient nothing by mouth Follow CMP in a.m. Will get abdominal ultrasound.  Elevated troponin Patient has mild elevation of troponin 0.06 likely from demand ischemia. Will cycle troponin the hospital  Diabetes mellitus *Sliding-scale insulin with NovoLog Keep patient nothing by mouth   Code status: DO NOT RESUSCITATE  Family discussion: No family present at bedside   Time Spent on Admission: 60 min  [facility] Pager: [phone] 12/11/2015, 6:34 AM  If 7PM-7AM, please contact night-coverage  [email or web address]  Password [email or web address]

## 2015-12-11 NOTE — Progress Notes (Addendum)
Patient seen and examined, database reviewed. Discussed with daughter Jeannene Patella at bedside. She says her mother has lived a good life and has greatly decompensated over the past few months. They don't want her in pain anymore. I have recommended a palliative care consultation which she has agreed to. Both she and her younger sister share HCPOA. Will continue to follow. Continue Aztreonam for now, however will discontinue vancomycin as unlikely to have gram-positive organism causing sepsis in the GI tract.  Domingo Mend, MD [facility] Pager: [phone]

## 2015-12-11 NOTE — Progress Notes (Signed)
Prolonged family conversation with patient's 3 daughters [name]. Since I saw patient this morning she has decompensated, blood pressure is now in the 60s over 30s, she is growing gram-negative rods in her blood. She has become lethargic. There is a lot of difficult family dynamics that is impairing the medical decision making process. Fraser Din and Jenny Reichmann who are the patient's healthcare power of attorney's, confirm patient's DO NOT RESUSCITATE state, they would not want IV fluids or pressors given to increase blood pressure. They do want to continue antibiotics for now. Morphine to be used only for extreme discomfort as Vaughan Basta has some reservations about the use of morphine as she believes this is what killed her father. Have explained to them that patient is critically ill and in her current state I do not expect her to survive this hospitalization.  Domingo Mend, MD [facility] Pager: [phone]

## 2015-12-11 NOTE — ED Notes (Signed)
Dr. Darrick Meigs also aware of pt not having any IV access. Order for PICC line received.

## 2015-12-11 NOTE — ED Notes (Signed)
EDP aware that pt's IV has infiltrated. EDP states she is going to place an IO on this pt.

## 2015-12-11 NOTE — ED Notes (Signed)
Report given to [place], RN at this time for 203

## 2015-12-11 NOTE — ED Notes (Signed)
[facility] reports that patient was vomiting and running a fever.  Reported that the emesis was dark brown.  Did not medicate her for her fever.  Stated she was lethargic.

## 2015-12-11 NOTE — Consult Note (Signed)
Consultation Note Date: 12/11/2015   Patient Name: [name]  DOB: [birth year]  MRN: [id number]  Age / Sex: 82 y.o., female  PCP: No primary care provider on file. Referring Physician: Mikki Harbor*  Reason for Consultation: Establishing goals of care, Psychosocial/spiritual support and Terminal Care   Clinical Assessment/Narrative: [name] is lying quietly in bed with daughters [name] and [name] at bedside.  They share that sister, [name] will be coming from [place].  [name] states that her sister, [name], feels that Palliative medicine "will give an injection and they will die in one day".  They share that their father died with hospice. He had "cancer all over" and asked to go home to die. [name] was unable to accept her father's request for hospice, did not participate in his care, and blames her sisters for his death.   We talk about their mother's current health conditions and problems. We also talk about the HIDA scan and the perc drain and what this would look like. [name] shares that her mother had asked to not come into the hospital this time, and asks why she has IVs, stating she does not want them.  [name] shares her mothers recent decline, and we talk about the changes that occur as we come close to passing. Sleeping more, eating less, and interacting less both daughters nod and state that this is what's happening now.  [name] shares that her mother enjoyed eating and even this is coming from her now.  We talk about the concepts of do not treat the next infection, do not re-hospitalize, allow a natural death. That this is not illegal or unethical, but some people have a moral problem with not doing everything. I share with [name] and [name] that they have the right and the responsibility to say what is right for their mother during this time. [name] shares that she feels that some of her sister's  reluctance to transition to comfort care's faith-based. I share that I will follow-up with them tomorrow and we will continue discussing their goals for their mother's care.  Contacts/Participants in Discussion:  Daughters Designer, multimedia and [name] Primary Decision Maker: Daughters Designer, multimedia and [name]   Relationship to Patient Daughters  HCPOA: yes  Daughters Designer, multimedia and [name]  SUMMARY OF RECOMMENDATIONS  Code Status/Advance Care Planning: DNR- we discuss the concepts of allow a natural death, let nature take it's course.  Do not treat the next infection, do not re-hospitalize.     Code Status Orders        Start     Ordered   12/11/15 0901  Do not attempt resuscitation (DNR)   Continuous    Question Answer Comment  In the event of cardiac or respiratory ARREST Do not call a "code blue"   In the event of cardiac or respiratory ARREST Do not perform Intubation, CPR, defibrillation or ACLS   In the event of cardiac or respiratory ARREST Use medication by any route, position, wound care, and other measures to relive pain and suffering. May use oxygen, suction and manual treatment of airway obstruction as needed for comfort.      12/11/15 0900    Code Status History    Date Active Date Inactive Code Status Order ID Comments User Context   11/18/2015  6:09 AM 11/21/2015  2:37 PM DNR [id number]  Vianne Bulls, MD Inpatient   11/13/2014  6:53 PM 11/15/2014  6:46 PM DNR [id number]  Kathie Dike, MD Inpatient   04/15/2014  3:30 AM 04/20/2014  6:43 PM DNR [id number]  Truett Mainland, DO Inpatient   12/10/2013  2:38 PM 12/14/2013  8:11 PM DNR [id number]  Radene Gunning, NP Inpatient   12/13/2011  2:22 PM 12/14/2011  2:17 PM DNR [id number]  Jonetta Osgood, MD Inpatient    Advance Directive Documentation        Most Recent Value   Type of Advance Directive  Healthcare Power of [place], Out of facility DNR (pink MOST or yellow form)   Pre-existing out of facility DNR order (yellow form  or pink MOST form)  Yellow form placed in chart (order not valid for inpatient use)   "MOST" Form in Place?        Other Directives:None  Symptom Management:   Per hospitalist.   Palliative Prophylaxis:   Oral Care and Turn Reposition  Additional Recommendations (Limitations, Scope, Preferences):  Family is trying to decide if they will have HIDA scan and perc drain.   Psycho-social/Spiritual:  Support System: Strong Desire for further Chaplaincy support:no Additional Recommendations: Caregiving  Support/Resources, Education on Hospice and Grief/Bereavement Support  Prognosis: < 4 weeks likely d/t repeat hospitalizations, and frailty, cancer, and weight loss.   Discharge Planning: Undecided at this time.    Chief Complaint/ Primary Diagnoses: Present on Admission:  . Fever . Sepsis ([place]) . Type 2 diabetes with nephropathy ([place]) . Benign renovascular hypertension  I have reviewed the medical record, interviewed the patient and family, and examined the patient. The following aspects are pertinent.  Past Medical History  Diagnosis Date  . Breast cancer ([place])   . Essential hypertension, benign   . Depression   . GERD (gastroesophageal reflux disease)   . Hypercholesteremia   . CKD (chronic kidney disease) stage 3, GFR 30-59 ml/min   . Pneumonia   . UTI (lower urinary tract infection)   . Arthritis   . Asthma   . Venous stasis   . Type 2 diabetes mellitus ([place])   . Chronic diastolic heart failure (H[facility])     LVEF 70%  . Mitral stenosis     Moderate  . Secondary pulmonary hypertension (H[facility])     PASP 64 mmHg  . CHF (congestive heart failure) ([place])   . Cholelithiasis 11/19/2015  . Choledocholithiasis 11/19/2015   Social History   Social History  . Marital Status: Widowed    Spouse Name: N/A  . Number of Children: N/A  . Years of Education: N/A   Social History Main Topics  . Smoking status: Never Smoker   . Smokeless tobacco: Never Used  . Alcohol Use: No    . Drug Use: No  . Sexual Activity: No   Other Topics Concern  . None   Social History Narrative   Family History  Problem Relation Age of Onset  . Asthma Other   . Diabetes Other    Scheduled Meds: . allopurinol  200 mg Oral Daily  . amLODipine  10 mg Oral Daily  . antiseptic oral rinse  7 mL Mouth Rinse BID  . aspirin  325 mg Oral Daily  . aztreonam  1 g Intravenous Q8H  . enoxaparin (LOVENOX) injection  30 mg Subcutaneous Q24H  . insulin aspart  0-9 Units Subcutaneous TID WC  . metoprolol tartrate  12.5 mg Oral BID  . PARoxetine  30 mg Oral Daily  . polyvinyl alcohol  1 drop Both Eyes BID   Continuous Infusions: . sodium chloride 75  mL/hr at 12/11/15 1000   PRN Meds:.acetaminophen **OR** acetaminophen, albuterol Medications Prior to Admission:  Prior to Admission medications   Medication Sig Start Date End Date Taking? Authorizing Provider  acetaminophen (TYLENOL) 325 MG tablet Take 650 mg by mouth every 6 (six) hours as needed for mild pain.    Yes Historical Provider, MD  albuterol (PROVENTIL HFA;VENTOLIN HFA) 108 (90 BASE) MCG/ACT inhaler Inhale 2 puffs into the lungs every 6 (six) hours as needed for wheezing or shortness of breath.   Yes Historical Provider, MD  albuterol (PROVENTIL) (2.5 MG/3ML) 0.083% nebulizer solution Take 2.5 mg by nebulization every 6 (six) hours as needed for wheezing or shortness of breath.   Yes Historical Provider, MD  allopurinol (ZYLOPRIM) 100 MG tablet TAKE (2) TABLETS BY MOUTH ONCE DAILY. 08/07/13  Yes Mikey Kirschner, MD  amLODipine (NORVASC) 10 MG tablet Take 10 mg by mouth daily.   Yes Historical Provider, MD  aspirin 325 MG tablet Take 1 tablet (325 mg total) by mouth daily. 11/15/14  Yes Kathie Dike, MD  Cranberry 475 MG CAPS Take 1 capsule by mouth every 12 (twelve) hours.   Yes Historical Provider, MD  Cranberry-Vitamin C-Inulin (UTI-STAT) LIQD Take 30 mLs by mouth daily.   Yes Historical Provider, MD  diphenhydrAMINE (BENADRYL)  25 MG tablet Take 12.5 mg by mouth every 8 (eight) hours as needed for itching.   Yes Historical Provider, MD  ENSURE (ENSURE) Take 237 mLs by mouth 2 (two) times daily between meals. Please mix with ice cream.   Yes Historical Provider, MD  magnesium hydroxide (MILK OF MAGNESIA) 400 MG/5ML suspension Take 30 mLs by mouth daily as needed for mild constipation.   Yes Historical Provider, MD  metoprolol tartrate (LOPRESSOR) 25 MG tablet Take 12.5 mg by mouth 2 (two) times daily. Take one-half tab equal 12.5 mg bid   Yes Historical Provider, MD  PARoxetine (PAXIL) 30 MG tablet Take 30 mg by mouth daily.   Yes Historical Provider, MD  phenol (CHLORASEPTIC) 1.4 % LIQD Use as directed 2 sprays in the mouth or throat as needed for throat irritation / pain.   Yes Historical Provider, MD  Polyethyl Glycol-Propyl Glycol (SYSTANE OP) Place 1 drop into both eyes 2 (two) times daily.   Yes Historical Provider, MD  ranitidine (ZANTAC) 150 MG tablet Take 150 mg by mouth at bedtime.   Yes Historical Provider, MD  Spacer/Aero-Holding Chambers (AEROCHAMBER MV) inhaler by Other route. Use as instructed 2 puffs BID   Yes Historical Provider, MD   Allergies  Allergen Reactions  . Aleve [Naproxen Sodium]   . Carbapenems Other (See Comments)    unknown  . Cephalosporins Other (See Comments)    Unknown/ pt has had rocephin in the past and tolerated  . Codeine Nausea Only  . Amoxicillin Rash  . Penicillins Rash    Review of Systems  Unable to perform ROS: Acuity of condition    Physical Exam  Nursing note and vitals reviewed. Constitutional: No distress.  HENT:  Head: Normocephalic and atraumatic.  Cardiovascular: Normal rate and regular rhythm.   Respiratory: No respiratory distress.  GI: Soft. She exhibits no distension. There is no guarding.  Neurological:  Lethargic, opens eyes to command. Hard of hearing  Skin: Skin is warm and dry.    Vital Signs: BP 92/42 mmHg  Pulse 96  Temp(Src) 99.5 F  (37.5 C) (Oral)  Resp 23  Ht 5\' 3"  (1.6 m)  Wt 78.5 kg (173 lb 1 oz)  BMI 30.66 kg/m2  SpO2 95%  SpO2: SpO2: 95 % O2 Device:SpO2: 95 % O2 Flow Rate: .   IO: Intake/output summary:  Intake/Output Summary (Last 24 hours) at 12/11/15 1626 Last data filed at 12/11/15 1000  Gross per 24 hour  Intake    150 ml  Output      0 ml  Net    150 ml    LBM:   Baseline Weight: Weight: 78.5 kg (173 lb 1 oz) Most recent weight: Weight: 78.5 kg (173 lb 1 oz)      Palliative Assessment/Data:  Flowsheet Rows        Most Recent Value   Intake Tab    Referral Department  Hospitalist   Unit at Time of Referral  ICU   Palliative Care Primary Diagnosis  Cancer   Date Notified  12/11/15   Palliative Care Type  New Palliative care   Reason for referral  Clarify Goals of Care   Date of Admission  12/11/15   Date first seen by Palliative Care  12/11/15   # of days Palliative referral response time  0 Day(s)   # of days IP prior to Palliative referral  0   Clinical Assessment    Palliative Performance Scale Score  20%   Pain Max last 24 hours  Not able to report   Pain Min Last 24 hours  Not able to report   Dyspnea Max Last 24 Hours  Not able to report   Dyspnea Min Last 24 hours  Not able to report   Psychosocial & Spiritual Assessment    Palliative Care Outcomes    Patient/Family meeting held?  Yes   Who was at the meeting?  Daughters Designer, multimedia and [place] Outcomes  Clarified goals of care, Provided psychosocial or spiritual support   Patient/Family wishes: Interventions discontinued/not started   Mechanical Ventilation   Palliative Care follow-up planned  -- [follow up at [facility]]      Additional Data Reviewed:  CBC:    Component Value Date/Time   WBC 22.6* 12/11/2015 0500   HGB 12.2 12/11/2015 0500   HCT 37.6 12/11/2015 0500   PLT 317 12/11/2015 0500   MCV 85.6 12/11/2015 0500   NEUTROABS 21.9* 12/11/2015 0500   LYMPHSABS 0.3* 12/11/2015 0500   MONOABS  0.5 12/11/2015 0500   EOSABS 0.0 12/11/2015 0500   BASOSABS 0.0 12/11/2015 0500   Comprehensive Metabolic Panel:    Component Value Date/Time   NA 134* 12/11/2015 0500   NA 139 09/13/2015   K 4.1 12/11/2015 0500   CL 102 12/11/2015 0500   CO2 20* 12/11/2015 0500   BUN 22* 12/11/2015 0500   BUN 65* 09/13/2015   CREATININE 1.29* 12/11/2015 0500   CREATININE 1.3* 09/13/2015   CREATININE 1.35* 09/10/2013 0911   GLUCOSE 171* 12/11/2015 0500   CALCIUM 8.8* 12/11/2015 0500   AST 145* 12/11/2015 0500   ALT 54 12/11/2015 0500   ALKPHOS 327* 12/11/2015 0500   BILITOT 2.5* 12/11/2015 0500   PROT 8.2* 12/11/2015 0500   ALBUMIN 3.2* 12/11/2015 0500     Time In: 1500 Time Out: 1545 Time Total: 45 minutes Greater than 50%  of this time was spent counseling and coordinating care related to the above assessment and plan.  Signed by: Drue Novel, NP  Drue Novel, NP  12/11/2015, 4:26 PM  Please contact Palliative Medicine Team phone at [phone] for questions and concerns.

## 2015-12-12 DIAGNOSIS — N390 Urinary tract infection, site not specified: Secondary | ICD-10-CM

## 2015-12-12 DIAGNOSIS — B9620 Unspecified Escherichia coli [E. coli] as the cause of diseases classified elsewhere: Secondary | ICD-10-CM

## 2015-12-12 DIAGNOSIS — R7881 Bacteremia: Secondary | ICD-10-CM

## 2015-12-12 LAB — GLUCOSE, CAPILLARY
GLUCOSE-CAPILLARY: 116 mg/dL — AB (ref 65–99)
GLUCOSE-CAPILLARY: 97 mg/dL (ref 65–99)

## 2015-12-12 NOTE — Plan of Care (Signed)
Problem: Skin Integrity: Goal: Risk for impaired skin integrity will decrease Outcome: Progressing Skin care provided to sacral area, pink foam sacral dressing changed.

## 2015-12-12 NOTE — Progress Notes (Signed)
Daily Progress Note   Patient Name: [name]       Date: 12/12/2015 DOB: 17-Nov-1921  Age: 82 y.o. MRN#: [id number] Attending Physician: Mikki Harbor* Primary Care Physician: No primary care provider on file. Admit Date: 12/11/2015  Reason for Consultation/Follow-up: Establishing goals of care and Psychosocial/spiritual support  Subjective: [name] is lying quietly in bed, her daughter's Fraser Din and [place] at bedside.  She is able to look at me as I enter and gives small smile.  We talk about [name]'s sepsis, E. coli infection, and continuing antibiotic treatments for the next day or two.  Fraser Din shares that they thought they would lose her last night due to low blood pressure but that she looks better today. She does go on to talk about "cycling" with urinary tract infections and that she expects her mother to continue cycling with this gallbladder problem.  I share the chronic illness trajectory.   Fraser Din shares that her mother is been refusing medications, lab draws, and blood sugar checks at times. I ask [name] how she feels about this, and she shares that she's not sure that her mother knows what's happening. I share that I do believe Mrs. [name] realizes that she's very ill and in the hospital and that these are her choices at this time. Fraser Din goes on to share that her mother told her, while in the nursing home, "I'm really sick".  We talk about continuing treatments for a few more days to see if Ms. [name] is able to improve. I review her albumin levels which are normal low at this time.  Chaplain Joya Gaskins comes to visit as I leave.  Length of Stay: 1 day  Current Medications: Scheduled Meds:  . allopurinol  200 mg Oral Daily  . antiseptic oral rinse  7 mL Mouth Rinse BID  . aspirin   325 mg Oral Daily  . aztreonam  1 g Intravenous Q8H  . enoxaparin (LOVENOX) injection  30 mg Subcutaneous Q24H  . insulin aspart  0-9 Units Subcutaneous TID WC  . PARoxetine  30 mg Oral Daily  . polyvinyl alcohol  1 drop Both Eyes BID    Continuous Infusions: . sodium chloride 1,000 mL (12/12/15 0731)    PRN Meds: acetaminophen **OR** acetaminophen, albuterol  Physical Exam: Physical Exam  Constitutional: No distress.  HENT:  Head: Normocephalic and atraumatic.  Cardiovascular: Normal rate and regular rhythm.   Pulmonary/Chest: Effort normal. No respiratory distress.  Abdominal: Soft. She exhibits no distension. There is no guarding.  Neurological: She is alert.                Vital Signs: BP 118/65 mmHg  Pulse 87  Temp(Src) 97 F (36.1 C) (Axillary)  Resp 17  Ht 5\' 3"  (1.6 m)  Wt 80.5 kg (177 lb 7.5 oz)  BMI 31.45 kg/m2  SpO2 95% SpO2: SpO2: 95 % O2 Device: O2 Device: Not Delivered O2 Flow Rate:    Intake/output summary:  Intake/Output Summary (Last 24 hours) at 12/12/15 1538 Last data filed at 12/12/15 0500  Gross per 24 hour  Intake   1300 ml  Output      0 ml  Net   1300 ml   LBM:   Baseline Weight: Weight: 78.5 kg (173 lb 1 oz) Most recent weight: Weight: 80.5 kg (177 lb 7.5 oz)       Palliative Assessment/Data: Flowsheet Rows        Most Recent Value   Intake Tab    Referral Department  Hospitalist   Unit at Time of Referral  ICU   Palliative Care Primary Diagnosis  Cancer   Date Notified  12/11/15   Palliative Care Type  New Palliative care   Reason for referral  Clarify Goals of Care   Date of Admission  12/11/15   Date first seen by Palliative Care  12/11/15   # of days Palliative referral response time  0 Day(s)   # of days IP prior to Palliative referral  0   Clinical Assessment    Palliative Performance Scale Score  20%   Pain Max last 24 hours  Not able to report   Pain Min Last 24 hours  Not able to report   Dyspnea Max Last 24 Hours   Not able to report   Dyspnea Min Last 24 hours  Not able to report   Psychosocial & Spiritual Assessment    Palliative Care Outcomes    Patient/Family meeting held?  Yes   Who was at the meeting?  Daughters Designer, multimedia and [place] Outcomes  Clarified goals of care, Provided psychosocial or spiritual support   Patient/Family wishes: Interventions discontinued/not started   Mechanical Ventilation   Palliative Care follow-up planned  -- [follow up at [facility]]      Additional Data Reviewed: CBC    Component Value Date/Time   WBC 22.6* 12/11/2015 0500   RBC 4.39 12/11/2015 0500   HGB 12.2 12/11/2015 0500   HCT 37.6 12/11/2015 0500   PLT 317 12/11/2015 0500   MCV 85.6 12/11/2015 0500   MCH 27.8 12/11/2015 0500   MCHC 32.4 12/11/2015 0500   RDW 14.9 12/11/2015 0500   LYMPHSABS 0.3* 12/11/2015 0500   MONOABS 0.5 12/11/2015 0500   EOSABS 0.0 12/11/2015 0500   BASOSABS 0.0 12/11/2015 0500    CMP     Component Value Date/Time   NA 134* 12/11/2015 0500   NA 139 09/13/2015   K 4.1 12/11/2015 0500   CL 102 12/11/2015 0500   CO2 20* 12/11/2015 0500   GLUCOSE 171* 12/11/2015 0500   BUN 22* 12/11/2015 0500   BUN 65* 09/13/2015   CREATININE 1.29* 12/11/2015 0500   CREATININE 1.3* 09/13/2015   CREATININE 1.35* 09/10/2013 0911   CALCIUM 8.8* 12/11/2015 0500  PROT 8.2* 12/11/2015 0500   ALBUMIN 3.2* 12/11/2015 0500   AST 145* 12/11/2015 0500   ALT 54 12/11/2015 0500   ALKPHOS 327* 12/11/2015 0500   BILITOT 2.5* 12/11/2015 0500   GFRNONAA 35* 12/11/2015 0500   GFRAA 41* 12/11/2015 0500       Problem List:  Patient Active Problem List   Diagnosis Date Noted  . E coli bacteremia 12/12/2015  . UTI (urinary tract infection) 12/12/2015  . Fever 12/11/2015  . Severe sepsis with septic shock ([place]) 12/11/2015  . Acute cholecystitis   . Cholecystitis   . Palliative care encounter   . DNR (do not resuscitate) discussion   . Lethargy 11/28/2015  .  Cholelithiasis 11/19/2015  . Choledocholithiasis 11/19/2015  . Bacteremia 11/19/2015  . Prolonged Q-T interval on ECG 11/18/2015  . Renal mass, right 11/18/2015  . Protein-calorie malnutrition, severe 11/18/2015  . Elevated LFTs 11/18/2015  . UTI (lower urinary tract infection)   . FTT (failure to thrive) in adult 04/26/2015  . Encephalopathy 11/13/2014  . Chronic kidney disease (CKD), stage IV (severe) ([place]) 04/16/2014  . COPD exacerbation ([place]) 04/15/2014  . Acute on chronic diastolic CHF (congestive heart failure) ([place]) 04/15/2014  . Acute renal insufficiency 04/15/2014  . Acute respiratory failure with hypoxia ([place]) 04/15/2014  . Benign renovascular hypertension 04/14/2014  . Mitral stenosis 02/03/2014  . Chronic diastolic heart failure ([place]) 12/10/2013  . Dysphagia, pharyngoesophageal phase 10/03/2013  . Stress incontinence 09/06/2013  . Type 2 diabetes with nephropathy ([place]) 03/28/2013  . Asthma, chronic 03/28/2013  . Arthritis 12/11/2011  . Essential hypertension, benign 12/11/2011  . Dyslipidemia 12/11/2011     Palliative Care Assessment & Plan    1.Code Status:  DNR    Code Status Orders        Start     Ordered   12/11/15 0901  Do not attempt resuscitation (DNR)   Continuous    Question Answer Comment  In the event of cardiac or respiratory ARREST Do not call a "code blue"   In the event of cardiac or respiratory ARREST Do not perform Intubation, CPR, defibrillation or ACLS   In the event of cardiac or respiratory ARREST Use medication by any route, position, wound care, and other measures to relive pain and suffering. May use oxygen, suction and manual treatment of airway obstruction as needed for comfort.      12/11/15 0900    Code Status History    Date Active Date Inactive Code Status Order ID Comments User Context   11/18/2015  6:09 AM 11/21/2015  2:37 PM DNR [id number]  Vianne Bulls, MD Inpatient   11/13/2014  6:53 PM 11/15/2014  6:46 PM DNR [id number]   Kathie Dike, MD Inpatient   04/15/2014  3:30 AM 04/20/2014  6:43 PM DNR [id number]  Truett Mainland, DO Inpatient   12/10/2013  2:38 PM 12/14/2013  8:11 PM DNR [id number]  Radene Gunning, NP Inpatient   12/13/2011  2:22 PM 12/14/2011  2:17 PM DNR [id number]  Jonetta Osgood, MD Inpatient    Advance Directive Documentation        Most Recent Value   Type of Advance Directive  Healthcare Power of [place], Out of facility DNR (pink MOST or yellow form)   Pre-existing out of facility DNR order (yellow form or pink MOST form)  Yellow form placed in chart (order not valid for inpatient use)   "MOST" Form in Place?         2. Goals  of Care/Additional Recommendations:  Continue IV antibiotics at this time, daughters Fraser Din and [name] are unwilling to place a perc drain at this time.  Limitations on Scope of Treatment: As above  Desire for further Chaplaincy support:yes  Psycho-social Needs: Caregiving  Support/Resources and Grief/Bereavement Support  3. Symptom Management:      1. Per hospitalist  4. Palliative Prophylaxis:   Frequent Pain Assessment, Oral Care and Turn Reposition  5. Prognosis: < 4 weeks likely based on sepsis, gram negative rods, failure to thrive.   6. Discharge Planning:  based on outcomes.    Care plan was discussed with nursing staff, CM, SW, and Dr. Jerilee Hoh  Thank you for allowing the Palliative Medicine Team to assist in the care of this patient.   Time In: 1450 Time Out: 1515 Total Time 25 minutes Prolonged Time Billed  no         Drue Novel, NP  12/12/2015, 3:38 PM  Please contact Palliative Medicine Team phone at [phone] for questions and concerns.

## 2015-12-12 NOTE — Progress Notes (Signed)
Pt is refusing accucheck sticks. Pt family is aware and ok with that.

## 2015-12-12 NOTE — Progress Notes (Signed)
[facility] PROGRESS NOTE  CARINE GUILLET [id number] DOB: 1921-09-28 DOA: 12/11/2015 PCP: No primary care provider on file.  Assessment/Plan: Severe sepsis with septic shock -So far urine cultures growing Pseudomonas and 2 out of 2 blood cultures are growing Escherichia coli. -Blood pressure has improved, we'll repeat lactic acid. -Poor long-term prognosis. -Main source of infection is believed to be the gallbladder. -They want her to be a DO NOT RESUSCITATE, they would not like to use pressors.  Escherichia coli bacteremia -Source is believed to be acute cholecystitis. -Surgery and palliative care have been on board. -There are difficult family dynamics between patient's 3 children. I believe they will eventually opt for palliative care. -4 now elect to continue antibiotics but they want to be conservative in her care. She would be a poor surgical candidate for a cholecystectomy and surgery has validated this. Surgery has given them the option of a percutaneous cholecystostomy, however with her critical illness and hemodynamic instability as of yesterday she was not deemed a candidate. -For now will need to continue with palliative care discussions.  Pseudomonas UTI -Continue Aztreonam pending culture data  . Elevated troponin   -likely due to demand ischemia in the presence of severe sepsis with shock.  -no cardiac workup is anticipated.  Code Status: DO NOT RESUSCITATE Family Communication: Daughter's Fraser Din and Jenny Reichmann at bedside updated on plan of care  Disposition Plan: Anticipate assuming more of a comfort care approach over the next 24-48 hours, suspect patient may not survive this hospitalization   Consultants:  Surgery   Antibiotics:  Aztreonam   Subjective: Resting comfortably, opens eyes but is not able to make eye contact or participate in meaningful conversation  Objective: Filed Vitals:   12/12/15 0900 12/12/15 1000 12/12/15 1100 12/12/15  1147  BP: 109/92 115/56 118/65   Pulse: 86 84 87   Temp:    97 F (36.1 C)  TempSrc:    Axillary  Resp: 16 16 17    Height:      Weight:      SpO2: 96% 95% 95%     Intake/Output Summary (Last 24 hours) at 12/12/15 1454 Last data filed at 12/12/15 0500  Gross per 24 hour  Intake   1300 ml  Output      0 ml  Net   1300 ml   Filed Weights   12/11/15 0939 12/12/15 0500  Weight: 78.5 kg (173 lb 1 oz) 80.5 kg (177 lb 7.5 oz)    Exam:   General:  Drowsy  Cardiovascular: Tachycardic, regular  Respiratory: Clear to auscultation  Abdomen: Soft, nontender, nondistended, positive bowel sounds  Extremities: No clubbing, cyanosis or edema   Neurologic:  Unable to fully assess given current mental state  Data Reviewed: Basic Metabolic Panel:  Recent Labs Lab 12/11/15 0500  NA 134*  K 4.1  CL 102  CO2 20*  GLUCOSE 171*  BUN 22*  CREATININE 1.29*  CALCIUM 8.8*   Liver Function Tests:  Recent Labs Lab 12/11/15 0500  AST 145*  ALT 54  ALKPHOS 327*  BILITOT 2.5*  PROT 8.2*  ALBUMIN 3.2*    Recent Labs Lab 12/11/15 0745  LIPASE 17   No results for input(s): AMMONIA in the last 168 hours. CBC:  Recent Labs Lab 12/11/15 0500  WBC 22.6*  NEUTROABS 21.9*  HGB 12.2  HCT 37.6  MCV 85.6  PLT 317   Cardiac Enzymes:  Recent Labs Lab 12/11/15 0500 12/11/15 0927 12/11/15 1517  TROPONINI 0.06*  0.09* 0.09*   BNP (last 3 results) No results for input(s): BNP in the last 8760 hours.  ProBNP (last 3 results) No results for input(s): PROBNP in the last 8760 hours.  CBG:  Recent Labs Lab 12/11/15 1131 12/11/15 1632 12/11/15 2056 12/12/15 0721 12/12/15 1139  GLUCAP 137* 121* 133* 116* 97    Recent Results (from the past 240 hour(s))  Blood Culture (routine x 2)     Status: Abnormal (Preliminary result)   Collection Time: 12/11/15  5:00 AM  Result Value Ref Range Status   Specimen Description BLOOD RIGHT ANTECUBITAL DRAWN BY RN  Final    Special Requests BOTTLES DRAWN AEROBIC AND ANAEROBIC 6CC  Final   Culture  Setup Time   Final    GRAM NEGATIVE RODS RECOVERED FROM BOTH BOTTLES Gram Stain Report Called to,Read Back By and Verified With: DAVIS,K. RN AT [id number] ON 12/11/2015 BY Elza Rafter. Performed at [facility]    Culture (A)  Final    ESCHERICHIA COLI SUSCEPTIBILITIES TO FOLLOW Performed at [facility]    Report Status PENDING  Incomplete  Blood Culture (routine x 2)     Status: Abnormal (Preliminary result)   Collection Time: 12/11/15  5:05 AM  Result Value Ref Range Status   Specimen Description BLOOD RIGHT ARM DRAWN BY RN  Final   Special Requests BOTTLES DRAWN AEROBIC ONLY [place]  Final   Culture  Setup Time   Final    GRAM NEGATIVE RODS RECOVERED FROM THE AEROBIC BOTTLE. Gram Stain Report Called to,Read Back By and Verified With: DAVIS,K. RN AT [id number] ON 12/11/2015 BY Elza Rafter. Performed at [place] (A)  Final    ESCHERICHIA COLI SUSCEPTIBILITIES TO FOLLOW Performed at [facility]    Report Status PENDING  Incomplete  Urine culture     Status: Abnormal (Preliminary result)   Collection Time: 12/11/15  6:30 AM  Result Value Ref Range Status   Specimen Description URINE, CATHETERIZED  Final   Special Requests NONE  Final   Culture >=100,000 COLONIES/mL PSEUDOMONAS AERUGINOSA (A)  Final   Report Status PENDING  Incomplete     Studies: Dg Chest 1 View  12/11/2015  CLINICAL DATA:  Nausea, vomiting and fever for 1 day. History of breast cancer, hypertension, chronic kidney disease and pneumonia. EXAM: CHEST 1 VIEW COMPARISON:  Chest radiograph November 17, 2015 FINDINGS: The cardiac silhouette is mildly enlarged unchanged. Patient is rotated to the RIGHT, accentuating the mediastinum. Mediastinal and hilar calcified lymph nodes again noted. Mild prominence of the bronchovascular markings without pleural effusion or focal consolidation. Persistently mildly elevated RIGHT  hemidiaphragm. No pneumothorax. Surgical clips in LEFT breast. Severe degenerative change of the RIGHT shoulder, moderate on the LEFT. IMPRESSION: Prominence of the bronchovascular markings can be seen with vascular congestion or bronchitis without focal consolidation. Mild cardiomegaly. Electronically Signed   By: Elon Alas M.D.   On: 12/11/2015 05:45   US Abdomen Limited Ruq  12/11/2015  CLINICAL DATA:  Vomiting and fever with known gallstones. EXAM: US ABDOMEN LIMITED - RIGHT UPPER QUADRANT COMPARISON:  11/19/2015 FINDINGS: Gallbladder: Layering sludge noted in the lumen of the gallbladder with shadowing stones evident, measuring up to 1.5 cm diameter. Gallbladder wall is mildly thickened at 3-4 mm. No pericholecystic fluid. Common bile duct: Diameter: 4-5 mm Liver: No focal lesion identified. Within normal limits in parenchymal echogenicity. IMPRESSION: Gallbladder sludge with cholelithiasis. Gallbladder wall appears mildly thickened at 3-4 mm. Acute cholecystitis is a  consideration. If clinical picture is equivocal, nuclear scintigraphy may prove helpful to further evaluate. Electronically Signed   By: Misty Stanley M.D.   On: 12/11/2015 08:29    Scheduled Meds: . allopurinol  200 mg Oral Daily  . antiseptic oral rinse  7 mL Mouth Rinse BID  . aspirin  325 mg Oral Daily  . aztreonam  1 g Intravenous Q8H  . enoxaparin (LOVENOX) injection  30 mg Subcutaneous Q24H  . insulin aspart  0-9 Units Subcutaneous TID WC  . PARoxetine  30 mg Oral Daily  . polyvinyl alcohol  1 drop Both Eyes BID   Continuous Infusions: . sodium chloride 1,000 mL (12/12/15 0731)    Active Problems:   Type 2 diabetes with nephropathy (HCC)   Benign renovascular hypertension   Fever   Severe sepsis with septic shock (HCC)   Acute cholecystitis   Cholecystitis   Palliative care encounter   DNR (do not resuscitate) discussion   E coli bacteremia   UTI (urinary tract infection)    Time spent: 45 minutes.  Greater than 50% of this time was spent in direct contact with the patient coordinating care.    Lelon Frohlich  [facility] Pager [phone]  If 7PM-7AM, please contact night-coverage at [email or web address], password [email or web address] 12/12/2015, 2:54 PM  LOS: 1 day

## 2015-12-13 DIAGNOSIS — R6521 Severe sepsis with septic shock: Secondary | ICD-10-CM

## 2015-12-13 LAB — URINE CULTURE

## 2015-12-13 LAB — CULTURE, BLOOD (ROUTINE X 2)

## 2015-12-13 LAB — GLUCOSE, CAPILLARY
GLUCOSE-CAPILLARY: 108 mg/dL — AB (ref 65–99)
Glucose-Capillary: 108 mg/dL — ABNORMAL HIGH (ref 65–99)

## 2015-12-13 LAB — HEMOGLOBIN A1C
Hgb A1c MFr Bld: 6.4 % — ABNORMAL HIGH (ref 4.8–5.6)
Mean Plasma Glucose: 137 mg/dL

## 2015-12-13 MED ORDER — MORPHINE SULFATE (PF) 2 MG/ML IV SOLN: 2.0000 mg | INTRAVENOUS | Status: DC | PRN

## 2015-12-13 MED ORDER — ONDANSETRON HCL 4 MG/2ML IJ SOLN
4.0000 mg | Freq: Four times a day (QID) | INTRAMUSCULAR | Status: DC | PRN
  Administered 2015-12-13: 4 mg via INTRAVENOUS
  Filled 2015-12-13: qty 2

## 2015-12-13 NOTE — Care Management Note (Signed)
Case Management Note  Patient Details  Name: NATAYLA TUSSING MRN: [id number] Date of Birth: May 24, 1922  Subjective/Objective:      Patient being followed by palliative care and social work. Continue to follow patient for discharge planning purposes and changing needs. Anticipate hospice.               Action/Plan: Hospice.   Expected Discharge Date:                  Expected Discharge Plan:     In-House Referral:  Hospice / Palliative Care, Clinical Social Work  Discharge planning Services  CM Consult  Post Acute Care Choice:    Choice offered to:     DME Arranged:    DME Agency:     HH Arranged:    HH Agency:     Status of Service:  In process, will continue to follow  Medicare Important Message Given:    Date Medicare IM Given:    Medicare IM give by:    Date Additional Medicare IM Given:    Additional Medicare Important Message give by:     If discussed at [place] of Stay Meetings, dates discussed:    Additional Comments:  Alvie Heidelberg, RN 12/13/2015, 1:19 PM

## 2015-12-13 NOTE — Progress Notes (Signed)
[facility] PROGRESS NOTE  [name] [id number] DOB: 08-20-1922    PCP:   No primary care provider on file.   HPI: [name] is an 82 y.o. female admitted into the hospital on December 11, 2015 by Dr Darrick Meigs for E Coli septic shock, suspicious of biliary source, and was given antibiotics.  She has hx of breast CA, CKD stage III, recurrent UTI, HTN, GERD, asthma, HLD, and has been DNR, with palliative care consultation with current POC is to continue antibiotics, no invasive procedure, and mostly supportive care.  She appears weak, but denied any specific needs when I saw her this am.   Rewiew of Systems:  Not reliable.    Past Medical History  Diagnosis Date  . Breast cancer ([place])   . Essential hypertension, benign   . Depression   . GERD (gastroesophageal reflux disease)   . Hypercholesteremia   . CKD (chronic kidney disease) stage 3, GFR 30-59 ml/min   . Pneumonia   . UTI (lower urinary tract infection)   . Arthritis   . Asthma   . Venous stasis   . Type 2 diabetes mellitus ([place])   . Chronic diastolic heart failure (HCC)     LVEF 70%  . Mitral stenosis     Moderate  . Secondary pulmonary hypertension (HCC)     PASP 64 mmHg  . CHF (congestive heart failure) ([place])   . Cholelithiasis 11/19/2015  . Choledocholithiasis 11/19/2015    Past Surgical History  Procedure Laterality Date  . Replacement total knee    . Mastectomy    . Total hip arthroplasty    . Carotid endarterectomy    . Cataracts      Medications:  HOME MEDS: Prior to Admission medications   Medication Sig Start Date End Date Taking? Authorizing Provider  acetaminophen (TYLENOL) 325 MG tablet Take 650 mg by mouth every 6 (six) hours as needed for mild pain.    Yes Historical Provider, MD  albuterol (PROVENTIL HFA;VENTOLIN HFA) 108 (90 BASE) MCG/ACT inhaler Inhale 2 puffs into the lungs every 6 (six) hours as needed for wheezing or shortness of breath.   Yes Historical Provider, MD   albuterol (PROVENTIL) (2.5 MG/3ML) 0.083% nebulizer solution Take 2.5 mg by nebulization every 6 (six) hours as needed for wheezing or shortness of breath.   Yes Historical Provider, MD  allopurinol (ZYLOPRIM) 100 MG tablet TAKE (2) TABLETS BY MOUTH ONCE DAILY. 08/07/13  Yes Mikey Kirschner, MD  amLODipine (NORVASC) 10 MG tablet Take 10 mg by mouth daily.   Yes Historical Provider, MD  aspirin 325 MG tablet Take 1 tablet (325 mg total) by mouth daily. 11/15/14  Yes Kathie Dike, MD  Cranberry 475 MG CAPS Take 1 capsule by mouth every 12 (twelve) hours.   Yes Historical Provider, MD  Cranberry-Vitamin C-Inulin (UTI-STAT) LIQD Take 30 mLs by mouth daily.   Yes Historical Provider, MD  diphenhydrAMINE (BENADRYL) 25 MG tablet Take 12.5 mg by mouth every 8 (eight) hours as needed for itching.   Yes Historical Provider, MD  ENSURE (ENSURE) Take 237 mLs by mouth 2 (two) times daily between meals. Please mix with ice cream.   Yes Historical Provider, MD  magnesium hydroxide (MILK OF MAGNESIA) 400 MG/5ML suspension Take 30 mLs by mouth daily as needed for mild constipation.   Yes Historical Provider, MD  metoprolol tartrate (LOPRESSOR) 25 MG tablet Take 12.5 mg by mouth 2 (two) times daily. Take one-half tab equal 12.5 mg bid  Yes Historical Provider, MD  PARoxetine (PAXIL) 30 MG tablet Take 30 mg by mouth daily.   Yes Historical Provider, MD  phenol (CHLORASEPTIC) 1.4 % LIQD Use as directed 2 sprays in the mouth or throat as needed for throat irritation / pain.   Yes Historical Provider, MD  Polyethyl Glycol-Propyl Glycol (SYSTANE OP) Place 1 drop into both eyes 2 (two) times daily.   Yes Historical Provider, MD  ranitidine (ZANTAC) 150 MG tablet Take 150 mg by mouth at bedtime.   Yes Historical Provider, MD  Spacer/Aero-Holding Chambers (AEROCHAMBER MV) inhaler by Other route. Use as instructed 2 puffs BID   Yes Historical Provider, MD     Allergies:  Allergies  Allergen Reactions  . Aleve  [Naproxen Sodium]   . Carbapenems Other (See Comments)    unknown  . Cephalosporins Other (See Comments)    Unknown/ pt has had rocephin in the past and tolerated  . Codeine Nausea Only  . Amoxicillin Rash  . Penicillins Rash    Social History:   reports that she has never smoked. She has never used smokeless tobacco. She reports that she does not drink alcohol or use illicit drugs.  Family History: Family History  Problem Relation Age of Onset  . Asthma Other   . Diabetes Other      Physical Exam: Filed Vitals:   12/13/15 0300 12/13/15 0400 12/13/15 0500 12/13/15 0725  BP: 138/60 140/59    Pulse: 92 94 94   Temp:  97.4 F (36.3 C)  97.8 F (36.6 C)  TempSrc:  Axillary  Axillary  Resp: 15 16 17    Height:      Weight:  80.9 kg (178 lb 5.6 oz)    SpO2: 99% 95% 94%    Blood pressure 140/59, pulse 94, temperature 97.8 F (36.6 C), temperature source Axillary, resp. rate 17, height 5\' 3"  (1.6 m), weight 80.9 kg (178 lb 5.6 oz), SpO2 94 %.  GEN:  Pleasant  patient lying in the stretcher in no acute distress; cooperative with exam. PSYCH:  alert and confused.  does not appear anxious or depressed; affect is appropriate. HEENT: Mucous membranes pink and anicteric; PERRLA; EOM intact; no cervical lymphadenopathy nor thyromegaly or carotid bruit; no JVD; There were no stridor. Neck is very supple. Breasts:: Not examined CHEST WALL: No tenderness CHEST: Normal respiration, clear to auscultation bilaterally.  HEART: Regular rate and rhythm.  There are no murmur, rub, or gallops.   BACK: No kyphosis or scoliosis; no CVA tenderness ABDOMEN: soft and non-tender; no masses, no organomegaly, normal abdominal bowel sounds; no pannus; no intertriginous candida. There is no rebound and no distention. Rectal Exam: Not done EXTREMITIES: No bone or joint deformity; age-appropriate arthropathy of the hands and knees; no edema; no ulcerations.  There is no calf tenderness. Genitalia: not  examined PULSES: 2+ and symmetric SKIN: Normal hydration no rash or ulceration CNS: Cranial nerves 2-12 grossly intact no focal lateralizing neurologic deficit.  Speech is fluent; uvula elevated with phonation, facial symmetry and tongue midline. DTR are normal bilaterally, cerebella exam is intact, barbinski is negative and strengths are equaled bilaterally.  No sensory loss.   Labs on Admission:  Basic Metabolic Panel:  Recent Labs Lab 12/11/15 0500  NA 134*  K 4.1  CL 102  CO2 20*  GLUCOSE 171*  BUN 22*  CREATININE 1.29*  CALCIUM 8.8*   Liver Function Tests:  Recent Labs Lab 12/11/15 0500  AST 145*  ALT 54  ALKPHOS 327*  BILITOT 2.5*  PROT 8.2*  ALBUMIN 3.2*    Recent Labs Lab 12/11/15 0745  LIPASE 17    Recent Labs Lab 12/11/15 0500  WBC 22.6*  NEUTROABS 21.9*  HGB 12.2  HCT 37.6  MCV 85.6  PLT 317   Cardiac Enzymes:  Recent Labs Lab 12/11/15 0500 12/11/15 0927 12/11/15 1517  TROPONINI 0.06* 0.09* 0.09*    CBG:  Recent Labs Lab 12/11/15 1632 12/11/15 2056 12/12/15 0721 12/12/15 1139 12/13/15 0719  GLUCAP 121* 133* 116* 97 108*    Assessment/Plan Present on Admission:  . Fever . Severe sepsis with septic shock ([place]) . Type 2 diabetes with nephropathy ([place]) . Benign renovascular hypertension  PLAN: Severe sepsis with septic shock -So far urine cultures growing Pseudomonas and 2 out of 2 blood cultures are growing Escherichia coli. -Blood pressure has improved -Poor long-term prognosis. -Main source of infection is believed to be the gallbladder. -They want her to be a DO NOT RESUSCITATE, they would not like to use pressors.  Escherichia coli bacteremia -Source is believed to be acute cholecystitis. -Surgery and palliative care have been on board. -There are difficult family dynamics between patient's 3 children. I believe they will eventually opt for palliative care. -4 now elect to continue antibiotics but they want to be  conservative in her care. She would be a poor surgical candidate for a cholecystectomy and surgery has validated this. Surgery has given them the option of a percutaneous cholecystostomy, however with her critical illness and hemodynamic instability as of yesterday she was not deemed a candidate. -For now will need to continue with palliative care discussions.  Pseudomonas UTI -Continue Aztreonam pending culture data  . Elevated troponin  -likely due to demand ischemia in the presence of severe sepsis with shock.  -no cardiac workup is anticipated.  Code Status: DO NOT RESUSCITATE Family Communication: Daughter's Fraser Din and Jenny Reichmann at bedside updated on plan of care  Disposition Plan: Anticipate assuming more of a comfort care approach over the next 24-48 hours, suspect patient may not survive this hospitalization   Orvan Falconer, MD.  Rosalita Chessman [facility] Pager [phone] 7pm to 7am.  12/13/2015, 8:35 AM

## 2015-12-13 NOTE — Progress Notes (Signed)
Daily Progress Note   Patient Name: [name]       Date: 12/13/2015 DOB: 09-Oct-1921  Age: 82 y.o. MRN#: [id number] Attending Physician: Orvan Falconer, MD Primary Care Physician: No primary care provider on file. Admit Date: 12/11/2015  Reason for Consultation/Follow-up: Establishing goals of care and Psychosocial/spiritual support  Subjective: "[name]" is lying quietly in bed, her daughters Fraser Din and Jenny Reichmann are at bedside.  Fraser Din shares her last 24 hours, "improvements", "more sleepy today".   She tells me that she is waiting to talk to hospitalist, Dr. Marin Comment.  Her question is how long are the antibiotics to be administered.  I review pharmacy note from 4/10.    Fraser Din asks about length of stay in the hospital.  She also asks about comfort measures, whether in the hospital or the skilled nursing home. I share the most form with her, and she shares that she will has reviewed this in the past but will look at it again, so that providers know what they do and do not want for their mother.  Length of Stay: 2 days  Current Medications: Scheduled Meds:  . allopurinol  200 mg Oral Daily  . antiseptic oral rinse  7 mL Mouth Rinse BID  . aspirin  325 mg Oral Daily  . aztreonam  1 g Intravenous Q8H  . enoxaparin (LOVENOX) injection  30 mg Subcutaneous Q24H  . insulin aspart  0-9 Units Subcutaneous TID WC  . PARoxetine  30 mg Oral Daily  . polyvinyl alcohol  1 drop Both Eyes BID    Continuous Infusions: . sodium chloride 75 mL/hr at 12/13/15 1237    PRN Meds: acetaminophen **OR** acetaminophen, albuterol, ondansetron (ZOFRAN) IV  Physical Exam: Physical Exam  Constitutional: No distress.  HENT:  Head: Atraumatic.  Cardiovascular: Normal rate and regular rhythm.   Pulmonary/Chest: Effort  normal. No respiratory distress.  Abdominal: Soft. There is no guarding.  Neurological:  Lethargic, opens eyes to touch. Unable to determine orientation  Skin: Skin is warm and dry.  Nursing note and vitals reviewed.               Vital Signs: BP 146/75 mmHg  Pulse 99  Temp(Src) 97.6 F (36.4 C) (Axillary)  Resp 14  Ht 5\' 3"  (1.6 m)  Wt 80.9 kg (178 lb  5.6 oz)  BMI 31.60 kg/m2  SpO2 96% SpO2: SpO2: 96 % O2 Device: O2 Device: Not Delivered O2 Flow Rate:    Intake/output summary:  Intake/Output Summary (Last 24 hours) at 12/13/15 1525 Last data filed at 12/13/15 0900  Gross per 24 hour  Intake   2375 ml  Output      0 ml  Net   2375 ml   LBM: Last BM Date: 12/12/15 Baseline Weight: Weight: 78.5 kg (173 lb 1 oz) Most recent weight: Weight: 80.9 kg (178 lb 5.6 oz)       Palliative Assessment/Data: Flowsheet Rows        Most Recent Value   Intake Tab    Referral Department  Hospitalist   Unit at Time of Referral  ICU   Palliative Care Primary Diagnosis  Cancer   Date Notified  12/11/15   Palliative Care Type  New Palliative care   Reason for referral  Clarify Goals of Care   Date of Admission  12/11/15   Date first seen by Palliative Care  12/11/15   # of days Palliative referral response time  0 Day(s)   # of days IP prior to Palliative referral  0   Clinical Assessment    Palliative Performance Scale Score  20%   Pain Max last 24 hours  Not able to report   Pain Min Last 24 hours  Not able to report   Dyspnea Max Last 24 Hours  Not able to report   Dyspnea Min Last 24 hours  Not able to report   Psychosocial & Spiritual Assessment    Palliative Care Outcomes    Patient/Family meeting held?  Yes   Who was at the meeting?  Daughters Designer, multimedia and [place] Outcomes  Clarified goals of care, Provided psychosocial or spiritual support   Patient/Family wishes: Interventions discontinued/not started   Mechanical Ventilation   Palliative Care  follow-up planned  -- [follow up at [facility]]      Additional Data Reviewed: CBC    Component Value Date/Time   WBC 22.6* 12/11/2015 0500   RBC 4.39 12/11/2015 0500   HGB 12.2 12/11/2015 0500   HCT 37.6 12/11/2015 0500   PLT 317 12/11/2015 0500   MCV 85.6 12/11/2015 0500   MCH 27.8 12/11/2015 0500   MCHC 32.4 12/11/2015 0500   RDW 14.9 12/11/2015 0500   LYMPHSABS 0.3* 12/11/2015 0500   MONOABS 0.5 12/11/2015 0500   EOSABS 0.0 12/11/2015 0500   BASOSABS 0.0 12/11/2015 0500    CMP     Component Value Date/Time   NA 134* 12/11/2015 0500   NA 139 09/13/2015   K 4.1 12/11/2015 0500   CL 102 12/11/2015 0500   CO2 20* 12/11/2015 0500   GLUCOSE 171* 12/11/2015 0500   BUN 22* 12/11/2015 0500   BUN 65* 09/13/2015   CREATININE 1.29* 12/11/2015 0500   CREATININE 1.3* 09/13/2015   CREATININE 1.35* 09/10/2013 0911   CALCIUM 8.8* 12/11/2015 0500   PROT 8.2* 12/11/2015 0500   ALBUMIN 3.2* 12/11/2015 0500   AST 145* 12/11/2015 0500   ALT 54 12/11/2015 0500   ALKPHOS 327* 12/11/2015 0500   BILITOT 2.5* 12/11/2015 0500   GFRNONAA 35* 12/11/2015 0500   GFRAA 41* 12/11/2015 0500       Problem List:  Patient Active Problem List   Diagnosis Date Noted  . E coli bacteremia 12/12/2015  . UTI (urinary tract infection) 12/12/2015  . Fever 12/11/2015  .  Severe sepsis with septic shock ([place]) 12/11/2015  . Acute cholecystitis   . Cholecystitis   . Palliative care encounter   . DNR (do not resuscitate) discussion   . Lethargy 11/28/2015  . Cholelithiasis 11/19/2015  . Choledocholithiasis 11/19/2015  . Bacteremia 11/19/2015  . Prolonged Q-T interval on ECG 11/18/2015  . Renal mass, right 11/18/2015  . Protein-calorie malnutrition, severe 11/18/2015  . Elevated LFTs 11/18/2015  . UTI (lower urinary tract infection)   . FTT (failure to thrive) in adult 04/26/2015  . Encephalopathy 11/13/2014  . Chronic kidney disease (CKD), stage IV (severe) ([place]) 04/16/2014  . COPD exacerbation  ([place]) 04/15/2014  . Acute on chronic diastolic CHF (congestive heart failure) ([place]) 04/15/2014  . Acute renal insufficiency 04/15/2014  . Acute respiratory failure with hypoxia ([place]) 04/15/2014  . Benign renovascular hypertension 04/14/2014  . Mitral stenosis 02/03/2014  . Chronic diastolic heart failure ([place]) 12/10/2013  . Dysphagia, pharyngoesophageal phase 10/03/2013  . Stress incontinence 09/06/2013  . Type 2 diabetes with nephropathy ([place]) 03/28/2013  . Asthma, chronic 03/28/2013  . Arthritis 12/11/2011  . Essential hypertension, benign 12/11/2011  . Dyslipidemia 12/11/2011     Palliative Care Assessment & Plan    1.Code Status:  DNR    Code Status Orders        Start     Ordered   12/11/15 0901  Do not attempt resuscitation (DNR)   Continuous    Question Answer Comment  In the event of cardiac or respiratory ARREST Do not call a "code blue"   In the event of cardiac or respiratory ARREST Do not perform Intubation, CPR, defibrillation or ACLS   In the event of cardiac or respiratory ARREST Use medication by any route, position, wound care, and other measures to relive pain and suffering. May use oxygen, suction and manual treatment of airway obstruction as needed for comfort.      12/11/15 0900    Code Status History    Date Active Date Inactive Code Status Order ID Comments User Context   11/18/2015  6:09 AM 11/21/2015  2:37 PM DNR [id number]  Vianne Bulls, MD Inpatient   11/13/2014  6:53 PM 11/15/2014  6:46 PM DNR [id number]  Kathie Dike, MD Inpatient   04/15/2014  3:30 AM 04/20/2014  6:43 PM DNR [id number]  Truett Mainland, DO Inpatient   12/10/2013  2:38 PM 12/14/2013  8:11 PM DNR [id number]  Radene Gunning, NP Inpatient   12/13/2011  2:22 PM 12/14/2011  2:17 PM DNR [id number]  Jonetta Osgood, MD Inpatient    Advance Directive Documentation        Most Recent Value   Type of Advance Directive  Healthcare Power of [place], Out of facility DNR (pink MOST or yellow form)    Pre-existing out of facility DNR order (yellow form or pink MOST form)  Yellow form placed in chart (order not valid for inpatient use)   "MOST" Form in Place?         2. Goals of Care/Additional Recommendations:  Continue to treat the treatable. Considering comfort care.   Limitations on Scope of Treatment: No aggressive measures at this time  Desire for further Chaplaincy support: yes ongoing  Psycho-social Needs: Education on Hospice and Grief/Bereavement Support  3. Symptom Management:      1. Per hospitalist  4. Palliative Prophylaxis:   Aspiration, Frequent Pain Assessment, Oral Care and Turn Reposition  5. Prognosis: < 4 weeks likely based on sepsis, gram negative rods, failure  to thrive.    6. Discharge Planning:  based on outcomes   Care plan was discussed with Nursing staff, case manager, social worker, and Dr. Marin Comment on next rounds.  Thank you for allowing the Palliative Medicine Team to assist in the care of this patient.   Time In: 1440 Time Out: 1505 Total Time 25 minutes Prolonged Time Billed  no         Drue Novel, NP  12/13/2015, 3:25 PM  Please contact Palliative Medicine Team phone at [phone] for questions and concerns.

## 2015-12-13 NOTE — Progress Notes (Signed)
I had a lengthy conversation with her 2 daughters who shared POA about current therapy and prognosis. They had spoken with PMT as well, and now would like to have comfort care only, except to continue the antibiotics for another 2 days. Will implement CMO, except continuation of IV antibiotics as requested.  No labs, diagnostic tests will be performed.  Patient would like to be able to eat.  Will order diet as tolerated for pleasure feeding.  Start IV Morphine at 2-4 mg IVP every 1 hours as needed.   Orvan Falconer  MD  FACP. Hospital Medicine.

## 2015-12-13 NOTE — Clinical Social Work Note (Signed)
CSW spoke with [facility] and provided an update.  Keri advised that if the family chose to they could have Hospice in the facility.    Kimberlyann Hollar, Clydene Pugh, LCSW

## 2015-12-14 DIAGNOSIS — K810 Acute cholecystitis: Secondary | ICD-10-CM

## 2015-12-14 DIAGNOSIS — Z7189 Other specified counseling: Secondary | ICD-10-CM

## 2015-12-14 DIAGNOSIS — I150 Renovascular hypertension: Secondary | ICD-10-CM

## 2015-12-14 DIAGNOSIS — K819 Cholecystitis, unspecified: Secondary | ICD-10-CM

## 2015-12-14 MED ORDER — FLUCONAZOLE IN SODIUM CHLORIDE 100-0.9 MG/50ML-% IV SOLN
100.0000 mg | Freq: Once | INTRAVENOUS | Status: AC
  Administered 2015-12-14: 100 mg via INTRAVENOUS
  Filled 2015-12-14: qty 50

## 2015-12-14 NOTE — Progress Notes (Signed)
Called report to RN on 300. Patient will be going to 333 and on comfort care.

## 2015-12-14 NOTE — Progress Notes (Signed)
Daily Progress Note   Patient Name: [name]       Date: 12/14/2015 DOB: September 12, 1921  Age: 82 y.o. MRN#: [id number] Attending Physician: Orvan Falconer, MD Primary Care Physician: No primary care provider on file. Admit Date: 12/11/2015  Reason for Consultation/Follow-up: Disposition, Non pain symptom management, Psychosocial/spiritual support and Terminal Care  Subjective: "[name]" is resting quietly in bed. Her daughter [name] is at bedside. We talk about her transitioning to comfort care, but keeping antibiotics for another day. [name] asks about returning to [facility], and getting support for symptom management there. We talk about pain nausea and fever management.  She talks about her mother's desire to taste pizza. I share that at this time it's important for to have any food that she desires.  Daughter [name] arrives, and we also talk about symptom management in the skilled nursing home.  Length of Stay: 3 days  Current Medications: Scheduled Meds:  . antiseptic oral rinse  7 mL Mouth Rinse BID  . aztreonam  1 g Intravenous Q8H    Continuous Infusions: . sodium chloride 75 mL/hr at 12/14/15 0500    PRN Meds: acetaminophen **OR** acetaminophen, albuterol, morphine injection, ondansetron (ZOFRAN) IV  Physical Exam: Physical Exam  Constitutional: No distress.  Cardiovascular: Normal rate and regular rhythm.   Pulmonary/Chest: Effort normal. No respiratory distress.  Abdominal: Soft. There is no guarding.  Neurological:  Lethargic  Skin: Skin is warm and dry.  Nursing note and vitals reviewed.               Vital Signs: BP 145/76 mmHg  Pulse 96  Temp(Src) 98.4 F (36.9 C) (Axillary)  Resp 17  Ht 5\' 3"  (1.6 m)  Wt 81.6 kg (179 lb 14.3 oz)  BMI 31.88 kg/m2  SpO2 97% SpO2:  SpO2: 97 % O2 Device: O2 Device: Nasal Cannula O2 Flow Rate: O2 Flow Rate (L/min): 2 L/min  Intake/output summary:  Intake/Output Summary (Last 24 hours) at 12/14/15 1305 Last data filed at 12/14/15 0800  Gross per 24 hour  Intake   1995 ml  Output      0 ml  Net   1995 ml   LBM: Last BM Date: 12/12/15 Baseline Weight: Weight: 78.5 kg (173 lb 1 oz) Most recent weight: Weight: 81.6 kg (179 lb 14.3 oz)  Palliative Assessment/Data: Flowsheet Rows        Most Recent Value   Intake Tab    Referral Department  Hospitalist   Unit at Time of Referral  ICU   Palliative Care Primary Diagnosis  Cancer   Date Notified  12/11/15   Palliative Care Type  New Palliative care   Reason for referral  Clarify Goals of Care   Date of Admission  12/11/15   Date first seen by Palliative Care  12/11/15   # of days Palliative referral response time  0 Day(s)   # of days IP prior to Palliative referral  0   Clinical Assessment    Palliative Performance Scale Score  20%   Pain Max last 24 hours  Not able to report   Pain Min Last 24 hours  Not able to report   Dyspnea Max Last 24 Hours  Not able to report   Dyspnea Min Last 24 hours  Not able to report   Psychosocial & Spiritual Assessment    Palliative Care Outcomes    Patient/Family meeting held?  Yes   Who was at the meeting?  Daughters Designer, multimedia and [place] Outcomes  Clarified goals of care, Provided psychosocial or spiritual support   Patient/Family wishes: Interventions discontinued/not started   Mechanical Ventilation   Palliative Care follow-up planned  -- [follow up at [facility]]      Additional Data Reviewed: CBC    Component Value Date/Time   WBC 22.6* 12/11/2015 0500   RBC 4.39 12/11/2015 0500   HGB 12.2 12/11/2015 0500   HCT 37.6 12/11/2015 0500   PLT 317 12/11/2015 0500   MCV 85.6 12/11/2015 0500   MCH 27.8 12/11/2015 0500   MCHC 32.4 12/11/2015 0500   RDW 14.9 12/11/2015 0500   LYMPHSABS 0.3*  12/11/2015 0500   MONOABS 0.5 12/11/2015 0500   EOSABS 0.0 12/11/2015 0500   BASOSABS 0.0 12/11/2015 0500    CMP     Component Value Date/Time   NA 134* 12/11/2015 0500   NA 139 09/13/2015   K 4.1 12/11/2015 0500   CL 102 12/11/2015 0500   CO2 20* 12/11/2015 0500   GLUCOSE 171* 12/11/2015 0500   BUN 22* 12/11/2015 0500   BUN 65* 09/13/2015   CREATININE 1.29* 12/11/2015 0500   CREATININE 1.3* 09/13/2015   CREATININE 1.35* 09/10/2013 0911   CALCIUM 8.8* 12/11/2015 0500   PROT 8.2* 12/11/2015 0500   ALBUMIN 3.2* 12/11/2015 0500   AST 145* 12/11/2015 0500   ALT 54 12/11/2015 0500   ALKPHOS 327* 12/11/2015 0500   BILITOT 2.5* 12/11/2015 0500   GFRNONAA 35* 12/11/2015 0500   GFRAA 41* 12/11/2015 0500       Problem List:  Patient Active Problem List   Diagnosis Date Noted  . E coli bacteremia 12/12/2015  . UTI (urinary tract infection) 12/12/2015  . Fever 12/11/2015  . Severe sepsis with septic shock ([place]) 12/11/2015  . Acute cholecystitis   . Cholecystitis   . Palliative care encounter   . DNR (do not resuscitate) discussion   . Lethargy 11/28/2015  . Cholelithiasis 11/19/2015  . Choledocholithiasis 11/19/2015  . Bacteremia 11/19/2015  . Prolonged Q-T interval on ECG 11/18/2015  . Renal mass, right 11/18/2015  . Protein-calorie malnutrition, severe 11/18/2015  . Elevated LFTs 11/18/2015  . UTI (lower urinary tract infection)   . FTT (failure to thrive) in adult 04/26/2015  . Encephalopathy 11/13/2014  . Chronic kidney disease (CKD), stage IV (  severe) ([place]) 04/16/2014  . COPD exacerbation ([place]) 04/15/2014  . Acute on chronic diastolic CHF (congestive heart failure) ([place]) 04/15/2014  . Acute renal insufficiency 04/15/2014  . Acute respiratory failure with hypoxia ([place]) 04/15/2014  . Benign renovascular hypertension 04/14/2014  . Mitral stenosis 02/03/2014  . Chronic diastolic heart failure ([place]) 12/10/2013  . Dysphagia, pharyngoesophageal phase 10/03/2013  .  Stress incontinence 09/06/2013  . Type 2 diabetes with nephropathy ([place]) 03/28/2013  . Asthma, chronic 03/28/2013  . Arthritis 12/11/2011  . Essential hypertension, benign 12/11/2011  . Dyslipidemia 12/11/2011     Palliative Care Assessment & Plan    1.Code Status:  DNR    Code Status Orders        Start     Ordered   12/11/15 0901  Do not attempt resuscitation (DNR)   Continuous    Question Answer Comment  In the event of cardiac or respiratory ARREST Do not call a "code blue"   In the event of cardiac or respiratory ARREST Do not perform Intubation, CPR, defibrillation or ACLS   In the event of cardiac or respiratory ARREST Use medication by any route, position, wound care, and other measures to relive pain and suffering. May use oxygen, suction and manual treatment of airway obstruction as needed for comfort.      12/11/15 0900    Code Status History    Date Active Date Inactive Code Status Order ID Comments User Context   11/18/2015  6:09 AM 11/21/2015  2:37 PM DNR [id number]  Vianne Bulls, MD Inpatient   11/13/2014  6:53 PM 11/15/2014  6:46 PM DNR [id number]  Kathie Dike, MD Inpatient   04/15/2014  3:30 AM 04/20/2014  6:43 PM DNR [id number]  Truett Mainland, DO Inpatient   12/10/2013  2:38 PM 12/14/2013  8:11 PM DNR [id number]  Radene Gunning, NP Inpatient   12/13/2011  2:22 PM 12/14/2011  2:17 PM DNR [id number]  Jonetta Osgood, MD Inpatient    Advance Directive Documentation        Most Recent Value   Type of Advance Directive  Healthcare Power of [place], Out of facility DNR (pink MOST or yellow form)   Pre-existing out of facility DNR order (yellow form or pink MOST form)  Yellow form placed in chart (order not valid for inpatient use)   "MOST" Form in Place?         2. Goals of Care/Additional Recommendations:  Full comfort care.  Limitations on Scope of Treatment: Full Comfort Care  Desire for further Chaplaincy support:yes, ongoing  Psycho-social Needs:  Caregiving  Support/Resources and Grief/Bereavement Support  3. Symptom Management:      1. Per hospitalist  4. Palliative Prophylaxis:   Frequent Pain Assessment, Oral Care and Turn Reposition  5. Prognosis: < 4 weeks likely due to recurrent infection, frailty, and families desire to focus on comfort measures only.  6. Discharge Planning:  [place] with Hospice   Care plan was discussed with Case manager, social worker, and Dr. Marin Comment on next rounds.  Thank you for allowing the Palliative Medicine Team to assist in the care of this patient.   Time In: 1100 Time Out: 1125 Total Time 25 minutes  Prolonged Time Billed  no         Drue Novel, NP  12/14/2015, 1:05 PM  Please contact Palliative Medicine Team phone at [phone] for questions and concerns.

## 2015-12-14 NOTE — Progress Notes (Signed)
[facility] PROGRESS NOTE  ARADIA TORMEY [id number] DOB: 11/22/1921    PCP:   No primary care provider on file.   HPI:  [name] is an 82 y.o. female admitted into the hospital on December 11, 2015 by Dr Darrick Meigs for E Coli septic shock, suspicious of biliary source, and was given antibiotics. She has hx of breast CA, CKD stage III, recurrent UTI, HTN, GERD, asthma, HLD, and has been DNR, with palliative care consultation with current POC is to continue antibiotics, no invasive procedures, and mostly supportive care. She appears weak, but denied any specific needs.  Family wishes that she is CMO, except they would like to continue antibiotics until tomorrow.   Rewiew of Systems:  Unable.   Past Medical History  Diagnosis Date  . Breast cancer ([place])   . Essential hypertension, benign   . Depression   . GERD (gastroesophageal reflux disease)   . Hypercholesteremia   . CKD (chronic kidney disease) stage 3, GFR 30-59 ml/min   . Pneumonia   . UTI (lower urinary tract infection)   . Arthritis   . Asthma   . Venous stasis   . Type 2 diabetes mellitus ([place])   . Chronic diastolic heart failure (HCC)     LVEF 70%  . Mitral stenosis     Moderate  . Secondary pulmonary hypertension (HCC)     PASP 64 mmHg  . CHF (congestive heart failure) ([place])   . Cholelithiasis 11/19/2015  . Choledocholithiasis 11/19/2015    Past Surgical History  Procedure Laterality Date  . Replacement total knee    . Mastectomy    . Total hip arthroplasty    . Carotid endarterectomy    . Cataracts      Medications:  HOME MEDS: Prior to Admission medications   Medication Sig Start Date End Date Taking? Authorizing Provider  acetaminophen (TYLENOL) 325 MG tablet Take 650 mg by mouth every 6 (six) hours as needed for mild pain.    Yes Historical Provider, MD  albuterol (PROVENTIL HFA;VENTOLIN HFA) 108 (90 BASE) MCG/ACT inhaler Inhale 2 puffs into the lungs every 6 (six) hours as needed  for wheezing or shortness of breath.   Yes Historical Provider, MD  albuterol (PROVENTIL) (2.5 MG/3ML) 0.083% nebulizer solution Take 2.5 mg by nebulization every 6 (six) hours as needed for wheezing or shortness of breath.   Yes Historical Provider, MD  allopurinol (ZYLOPRIM) 100 MG tablet TAKE (2) TABLETS BY MOUTH ONCE DAILY. 08/07/13  Yes Mikey Kirschner, MD  amLODipine (NORVASC) 10 MG tablet Take 10 mg by mouth daily.   Yes Historical Provider, MD  aspirin 325 MG tablet Take 1 tablet (325 mg total) by mouth daily. 11/15/14  Yes Kathie Dike, MD  Cranberry 475 MG CAPS Take 1 capsule by mouth every 12 (twelve) hours.   Yes Historical Provider, MD  Cranberry-Vitamin C-Inulin (UTI-STAT) LIQD Take 30 mLs by mouth daily.   Yes Historical Provider, MD  diphenhydrAMINE (BENADRYL) 25 MG tablet Take 12.5 mg by mouth every 8 (eight) hours as needed for itching.   Yes Historical Provider, MD  ENSURE (ENSURE) Take 237 mLs by mouth 2 (two) times daily between meals. Please mix with ice cream.   Yes Historical Provider, MD  magnesium hydroxide (MILK OF MAGNESIA) 400 MG/5ML suspension Take 30 mLs by mouth daily as needed for mild constipation.   Yes Historical Provider, MD  metoprolol tartrate (LOPRESSOR) 25 MG tablet Take 12.5 mg by mouth 2 (two) times daily.  Take one-half tab equal 12.5 mg bid   Yes Historical Provider, MD  PARoxetine (PAXIL) 30 MG tablet Take 30 mg by mouth daily.   Yes Historical Provider, MD  phenol (CHLORASEPTIC) 1.4 % LIQD Use as directed 2 sprays in the mouth or throat as needed for throat irritation / pain.   Yes Historical Provider, MD  Polyethyl Glycol-Propyl Glycol (SYSTANE OP) Place 1 drop into both eyes 2 (two) times daily.   Yes Historical Provider, MD  ranitidine (ZANTAC) 150 MG tablet Take 150 mg by mouth at bedtime.   Yes Historical Provider, MD  Spacer/Aero-Holding Chambers (AEROCHAMBER MV) inhaler by Other route. Use as instructed 2 puffs BID   Yes Historical Provider, MD      Allergies:  Allergies  Allergen Reactions  . Aleve [Naproxen Sodium]   . Carbapenems Other (See Comments)    unknown  . Cephalosporins Other (See Comments)    Unknown/ pt has had rocephin in the past and tolerated  . Codeine Nausea Only  . Amoxicillin Rash  . Penicillins Rash    Social History:   reports that she has never smoked. She has never used smokeless tobacco. She reports that she does not drink alcohol or use illicit drugs.  Family History: Family History  Problem Relation Age of Onset  . Asthma Other   . Diabetes Other      Physical Exam: Filed Vitals:   12/14/15 0400 12/14/15 0500 12/14/15 0600 12/14/15 0700  BP: 138/69     Pulse: 98 96 95 99  Temp: 96.8 F (36 C)     TempSrc: Axillary     Resp: 14 14 13 13   Height:      Weight:  81.6 kg (179 lb 14.3 oz)    SpO2: 98% 96% 98% 97%   Blood pressure 138/69, pulse 99, temperature 96.8 F (36 C), temperature source Axillary, resp. rate 13, height 5\' 3"  (1.6 m), weight 81.6 kg (179 lb 14.3 oz), SpO2 97 %.  GEN:  Pleasant  patient lying in the stretcher in no acute distress; cooperative with exam. PSYCH:  alert and oriented x4; does not appear anxious or depressed; affect is appropriate. HEENT: Mucous membranes pink and anicteric; PERRLA; EOM intact; no cervical lymphadenopathy nor thyromegaly or carotid bruit; no JVD; There were no stridor. Neck is very supple. Breasts:: Not examined CHEST WALL: No tenderness CHEST: Normal respiration, clear to auscultation bilaterally.  HEART: Regular rate and rhythm.  There are no murmur, rub, or gallops.   BACK: No kyphosis or scoliosis; no CVA tenderness ABDOMEN: soft and non-tender; no masses, no organomegaly, normal abdominal bowel sounds; no pannus; no intertriginous candida. There is no rebound and no distention. Rectal Exam: Not done EXTREMITIES: No bone or joint deformity; age-appropriate arthropathy of the hands and knees; no edema; no ulcerations.  There is no  calf tenderness. Genitalia: not examined PULSES: 2+ and symmetric SKIN: Normal hydration no rash or ulceration CNS: Cranial nerves 2-12 grossly intact no focal lateralizing neurologic deficit.  Speech is fluent; uvula elevated with phonation, facial symmetry and tongue midline. DTR are normal bilaterally, cerebella exam is intact, barbinski is negative and strengths are equaled bilaterally.  No sensory loss.   Labs on Admission:  Basic Metabolic Panel:  Recent Labs Lab 12/11/15 0500  NA 134*  K 4.1  CL 102  CO2 20*  GLUCOSE 171*  BUN 22*  CREATININE 1.29*  CALCIUM 8.8*   Liver Function Tests:  Recent Labs Lab 12/11/15 0500  AST 145*  ALT 54  ALKPHOS 327*  BILITOT 2.5*  PROT 8.2*  ALBUMIN 3.2*    Recent Labs Lab 12/11/15 0745  LIPASE 17   CBC:  Recent Labs Lab 12/11/15 0500  WBC 22.6*  NEUTROABS 21.9*  HGB 12.2  HCT 37.6  MCV 85.6  PLT 317   Cardiac Enzymes:  Recent Labs Lab 12/11/15 0500 12/11/15 0927 12/11/15 1517  TROPONINI 0.06* 0.09* 0.09*    CBG:  Recent Labs Lab 12/11/15 2056 12/12/15 0721 12/12/15 1139 12/13/15 0719 12/13/15 1109  GLUCAP 133* 116* 97 108* 108*   Assessment/Plan Present on Admission:  . Fever . Severe sepsis with septic shock ([place]) . Type 2 diabetes with nephropathy ([place]) . Benign renovascular hypertension  PLAN:Severe sepsis with septic shock -So far urine cultures growing Pseudomonas and 2 out of 2 blood cultures are growing Escherichia coli. -Blood pressure has improved -Poor long-term prognosis.  Patient is now CMO, with no labs or intervention except IVF and IV antibiotics.  -Will transfer to the floor today.    Code Status: DO NOT RESUSCITATE, COMFORT MEASURE ONLY. Family Communication: Daughter's Fraser Din and Jenny Reichmann at bedside updated on plan of care     Nakkia Mackiewicz, MD.  FACP [facility] Pager [phone] 7pm to 7am.  12/14/2015, 7:30 AM

## 2015-12-15 ENCOUNTER — Inpatient Hospital Stay
Admission: RE | Admit: 2015-12-15 | Discharge: 2016-02-01 | Disposition: E | Payer: Medicare HMO | Source: Ambulatory Visit | Attending: Internal Medicine | Admitting: Internal Medicine

## 2015-12-15 DIAGNOSIS — R7881 Bacteremia: Secondary | ICD-10-CM

## 2015-12-15 LAB — BASIC METABOLIC PANEL
GLUCOSE: 126 mg/dL
GLUCOSE: 134 mg/dL

## 2015-12-15 MED ORDER — BISACODYL 10 MG RE SUPP
10.0000 mg | Freq: Once | RECTAL | Status: AC
  Administered 2015-12-15: 10 mg via RECTAL
  Filled 2015-12-15: qty 1

## 2015-12-15 MED ORDER — MORPHINE SULFATE (CONCENTRATE) 10 MG /0.5 ML PO SOLN: 10.0000 mg | ORAL | Status: DC | PRN

## 2015-12-15 NOTE — Care Management Important Message (Signed)
Important Message  Patient Details  Name: [name] MRN: [id number] Date of Birth: [birth year]   Medicare Important Message Given:  Yes    Alvie Heidelberg, RN 12/26/2015, 11:21 AM

## 2015-12-15 NOTE — Clinical Social Work Note (Signed)
CSW facilitated discharge.    CSW notified Tami at [facility] that patient was discharging.  CSW notified patient's daughter, Conley Rolls, of patient's discharge and that she would be transported to [facility] by hospital staff.  CSW signing off  Ihor Gully, [place] [phone]

## 2015-12-15 NOTE — Discharge Summary (Signed)
Physician Discharge Summary  [name] [id number] DOB: 07/30/1922 DOA: 12/11/2015  PCP: No primary care provider on file.  Admit date: 12/11/2015 Discharge date: 12/26/2015  Time spent: 35 minutes  Recommendations for Outpatient Follow-up:  1. Follow up with PCP in one week.    Discharge Diagnoses:  Active Problems:   Type 2 diabetes with nephropathy (HCC)   Benign renovascular hypertension   Fever   Severe sepsis with septic shock (HCC)   Acute cholecystitis   Cholecystitis   Palliative care encounter   DNR (do not resuscitate) discussion   E coli bacteremia   UTI (urinary tract infection)   Discharge Condition: slightly improved.   Diet recommendation: Pleasure eating, as tolerated.   Filed Weights   12/12/15 0500 12/13/15 0400 12/14/15 0500  Weight: 80.5 kg (177 lb 7.5 oz) 80.9 kg (178 lb 5.6 oz) 81.6 kg (179 lb 14.3 oz)    History of present illness: Patient was once again admitted for sepsis by Dr [name] on December 11, 2015.  As per his H and P:  " 82 year old female who  has a past medical history of Breast cancer ([place]); Essential hypertension, benign; Depression; GERD (gastroesophageal reflux disease); Hypercholesteremia; CKD (chronic kidney disease) stage 3, GFR 30-59 ml/min; Pneumonia; UTI (lower urinary tract infection); Arthritis; Asthma; Venous stasis; Type 2 diabetes mellitus ([place]); Chronic diastolic heart failure ([place]); Mitral stenosis; Secondary pulmonary hypertension (HCC); CHF (congestive heart failure) ([place]); Cholelithiasis (11/19/2015); and Choledocholithiasis (11/19/2015). Today was sent to the hospital from skilled nursing facility for fever and vomiting. Patient unable to provide any history. She was recently discharged from the hospital on 11/20/2015 after she was treated for UTI and bacteremia. At that time patient also had abnormal LFTs which improved suggesting transient choledocholethiasis. Today AST is 145 and total bili 2.5. In the ED patient  was found to have WBC 22,000, lactic acid 4.1, troponin 0.06. UA and culture are pending at this time.   Hospital Course: [name] is an 82 y.o. female admitted into the hospital on December 11, 2015 by Dr [name] for E Coli septic shock, suspicious of biliary source, and was given antibiotics. She has hx of breast CA, CKD stage III, recurrent UTI, HTN, GERD, asthma, HLD, and has been DNR.  She was seen in consultation with surgery, who did not feel that she is a candidate for gallbladder surgery, and was in consideration for IR biliary drainage placement.  Family and patient did not want to have any further intervention at this time, and therefore, she was manage convervatively with IV Atreonam.  Palliative care consultation was again requested, and I spoke with her and her family as well, and it was determined that she would like to receive comfort care only.  Therefore, her medical regimen was taylored.  Her antibiotic will be discontinued at this time, and she will be given concentrate morphine.  During her stay, she did become more alert with treatment, and she was able to voice that she has itchiness.  She was therefore given one dose of IV Diflucan for vaginal candidiasis.  She is now transferred to her SNF for comfort care and hospice care.  She will likely become septic again, but will not be a candidate for rehospitalization.  Thank you for allowing me to participate in her care.  Good Day.   Consultations:  Surgery  Palliative care medicine.   Discharge Exam: Filed Vitals:   12/14/15 2141 12/28/2015 0525  BP: 132/74 127/64  Pulse: 88 72  Temp: 98 F (36.7 C) 98.9 F (37.2 C)  Resp: 16 16     Discharge Instructions    Increase activity slowly    Complete by:  As directed           Current Discharge Medication List    START taking these medications   Details  Morphine Sulfate (MORPHINE CONCENTRATE) 10 mg / 0.5 ml concentrated solution Take 0.5 mLs (10 mg total) by mouth  every 3 (three) hours as needed for severe pain. Qty: 30 mL, Refills: 0      CONTINUE these medications which have NOT CHANGED   Details  acetaminophen (TYLENOL) 325 MG tablet Take 650 mg by mouth every 6 (six) hours as needed for mild pain.     albuterol (PROVENTIL HFA;VENTOLIN HFA) 108 (90 BASE) MCG/ACT inhaler Inhale 2 puffs into the lungs every 6 (six) hours as needed for wheezing or shortness of breath.    diphenhydrAMINE (BENADRYL) 25 MG tablet Take 12.5 mg by mouth every 8 (eight) hours as needed for itching.    ENSURE (ENSURE) Take 237 mLs by mouth 2 (two) times daily between meals. Please mix with ice cream.    magnesium hydroxide (MILK OF MAGNESIA) 400 MG/5ML suspension Take 30 mLs by mouth daily as needed for mild constipation.    phenol (CHLORASEPTIC) 1.4 % LIQD Use as directed 2 sprays in the mouth or throat as needed for throat irritation / pain.    Polyethyl Glycol-Propyl Glycol (SYSTANE OP) Place 1 drop into both eyes 2 (two) times daily.      STOP taking these medications     albuterol (PROVENTIL) (2.5 MG/3ML) 0.083% nebulizer solution      allopurinol (ZYLOPRIM) 100 MG tablet      amLODipine (NORVASC) 10 MG tablet      aspirin 325 MG tablet      Cranberry 475 MG CAPS      Cranberry-Vitamin C-Inulin (UTI-STAT) LIQD      metoprolol tartrate (LOPRESSOR) 25 MG tablet      PARoxetine (PAXIL) 30 MG tablet      ranitidine (ZANTAC) 150 MG tablet      Spacer/Aero-Holding Chambers (AEROCHAMBER MV) inhaler        Allergies  Allergen Reactions  . Aleve [Naproxen Sodium]   . Carbapenems Other (See Comments)    unknown  . Cephalosporins Other (See Comments)    Unknown/ pt has had rocephin in the past and tolerated  . Codeine Nausea Only  . Amoxicillin Rash  . Penicillins Rash      The results of significant diagnostics from this hospitalization (including imaging, microbiology, ancillary and laboratory) are listed below for reference.    Significant  Diagnostic Studies: Ct Abdomen Pelvis Wo Contrast  11/18/2015  CLINICAL DATA:  Acute onset of fever and leukocytosis. Confusion. Left lower quadrant abdominal pain. Initial encounter. EXAM: CT ABDOMEN AND PELVIS WITHOUT CONTRAST TECHNIQUE: Multidetector CT imaging of the abdomen and pelvis was performed following the standard protocol without IV contrast. COMPARISON:  None. FINDINGS: Mild bibasilar atelectasis or scarring is noted. Calcification is noted at the mitral valve. The liver and spleen are unremarkable in appearance. Stones are noted dependently within the gallbladder. The gallbladder is otherwise unremarkable. The pancreas and adrenal glands are unremarkable. There appears be a somewhat complex 4.1 cm mass arising at the lower pole of the right kidney. This is concerning for renal cell carcinoma. A 3.0 cm cyst is noted near the upper pole of the right kidney. Nonspecific perinephric stranding  is noted bilaterally. There is no evidence of hydronephrosis. No renal or ureteral stones are identified. No free fluid is identified. The small bowel is unremarkable in appearance. The stomach is within normal limits. No acute vascular abnormalities are seen. Scattered calcification is noted along the abdominal aorta and its branches. The appendix is not well characterized; there is no evidence of appendicitis. Mild diverticulosis is noted along the mid sigmoid colon. The colon is otherwise unremarkable. The bladder is mildly distended and grossly unremarkable. A mildly calcified uterine fibroid is seen. The uterus is otherwise unremarkable. No suspicious adnexal masses are seen. No inguinal lymphadenopathy is seen. No acute osseous abnormalities are identified. The patient's left hip arthroplasty is incompletely imaged but appears grossly unremarkable. Multilevel vacuum phenomenon is noted along the lumbar spine. IMPRESSION: 1. Apparent complex 4.1 cm mass at the lower pole of the right kidney. This is concerning  for renal cell carcinoma. Renal ultrasound would be helpful for further evaluation, as deemed clinically appropriate. 2. Cholelithiasis.  Gallbladder otherwise unremarkable. 3. Mild bibasilar atelectasis or scarring noted. 4. Calcification noted at the mitral valve. 5. Right renal cyst noted. 6. Scattered calcification along the abdominal aorta and its branches. 7. Mild diverticulosis along the mid sigmoid colon. 8. Mildly calcified uterine fibroid noted. 9. Mild degenerative change along the lumbar spine. Electronically Signed   By: Garald Balding M.D.   On: 11/18/2015 02:47   Dg Chest 1 View  12/11/2015  CLINICAL DATA:  Nausea, vomiting and fever for 1 day. History of breast cancer, hypertension, chronic kidney disease and pneumonia. EXAM: CHEST 1 VIEW COMPARISON:  Chest radiograph November 17, 2015 FINDINGS: The cardiac silhouette is mildly enlarged unchanged. Patient is rotated to the RIGHT, accentuating the mediastinum. Mediastinal and hilar calcified lymph nodes again noted. Mild prominence of the bronchovascular markings without pleural effusion or focal consolidation. Persistently mildly elevated RIGHT hemidiaphragm. No pneumothorax. Surgical clips in LEFT breast. Severe degenerative change of the RIGHT shoulder, moderate on the LEFT. IMPRESSION: Prominence of the bronchovascular markings can be seen with vascular congestion or bronchitis without focal consolidation. Mild cardiomegaly. Electronically Signed   By: Elon Alas M.D.   On: 12/11/2015 05:45   Dg Chest 2 View  11/17/2015  CLINICAL DATA:  Fever.  Altered mental status. EXAM: CHEST  2 VIEW COMPARISON:  09/05/2015. FINDINGS: Stable enlarged cardiac silhouette. Interval small amount of linear atelectasis in the left lower lung zone. Mild diffuse peribronchial thickening without significant change. Stable calcified mediastinal and right hilar lymph nodes. Thoracic spine degenerative changes. Left axillary surgical clips. IMPRESSION: 1. No  acute abnormality. 2. Stable cardiomegaly and chronic bronchitic changes. Electronically Signed   By: Claudie Revering M.D.   On: 11/17/2015 20:08   US Renal  11/18/2015  CLINICAL DATA:  Right renal mass seen on CT scan. EXAM: RENAL / URINARY TRACT ULTRASOUND COMPLETE COMPARISON:  CT scan from earlier today FINDINGS: Right Kidney: Length: 10.9 cm. There is a complex, apparently solid mass off the medial lower right kidney also seen on today's CT scan. This mass measures 5 x 4 x 4.3 cm. A 4 cm cyst is seen in the upper pole. No hydronephrosis. Left Kidney: Length: 10.3 cm.  No suspicious masses or hydronephrosis. Bladder: The bladder is not well assessed due to poor distention. IMPRESSION: 1. The lower pole right renal mass seen on recent CT imaging is heterogeneous in appearance today and appears to have internal blood flow. This is highly suspicious for a renal cell carcinoma. A  contrast-enhanced MRI or CT would be the most definitive imaging studies for complete evaluation. Electronically Signed   By: Dorise Bullion III M.D   On: 11/18/2015 12:17   Dg Chest Port 1 View  11/17/2015  CLINICAL DATA:  Acute onset of fever and shortness of breath. Code sepsis. Initial encounter. EXAM: PORTABLE CHEST 1 VIEW COMPARISON:  Chest radiograph performed earlier today at 7:48 p.m. FINDINGS: The lungs are mildly hypoexpanded. Mild peribronchial thickening is noted, with minimal bilateral atelectasis. There is no evidence of pleural effusion or pneumothorax. The cardiomediastinal silhouette is within normal limits. No acute osseous abnormalities are seen. Mild degenerative change is noted at the glenohumeral joints bilaterally. Clips are seen overlying the left axilla. IMPRESSION: Lungs mildly hypoexpanded. Mild chronic peribronchial thickening, with minimal bilateral atelectasis. Electronically Signed   By: Garald Balding M.D.   On: 11/17/2015 23:55   US Abdomen Limited Ruq  12/11/2015  CLINICAL DATA:  Vomiting and fever  with known gallstones. EXAM: US ABDOMEN LIMITED - RIGHT UPPER QUADRANT COMPARISON:  11/19/2015 FINDINGS: Gallbladder: Layering sludge noted in the lumen of the gallbladder with shadowing stones evident, measuring up to 1.5 cm diameter. Gallbladder wall is mildly thickened at 3-4 mm. No pericholecystic fluid. Common bile duct: Diameter: 4-5 mm Liver: No focal lesion identified. Within normal limits in parenchymal echogenicity. IMPRESSION: Gallbladder sludge with cholelithiasis. Gallbladder wall appears mildly thickened at 3-4 mm. Acute cholecystitis is a consideration. If clinical picture is equivocal, nuclear scintigraphy may prove helpful to further evaluate. Electronically Signed   By: Misty Stanley M.D.   On: 12/11/2015 08:29   US Abdomen Limited Ruq  11/19/2015  CLINICAL DATA:  Elevated liver enzymes.  History of breast carcinoma EXAM: US ABDOMEN LIMITED - RIGHT UPPER QUADRANT COMPARISON:  CT abdomen and pelvis November 18, 2015 FINDINGS: Gallbladder: Within the gallbladder, there are multiple echogenic foci which move and shadow consistent with gallstones. Largest gallstone measures approximately 1 cm in length. There is no gallbladder wall thickening or pericholecystic fluid. No sonographic Murphy sign noted by sonographer. Common bile duct: Diameter: 4 mm. There is no intrahepatic or extrahepatic biliary duct dilatation. Liver: No focal lesion identified. Within normal limits in parenchymal echogenicity. IMPRESSION: Cholelithiasis.  Study otherwise unremarkable. Electronically Signed   By: Lowella Grip III M.D.   On: 11/19/2015 11:49    Microbiology: Recent Results (from the past 240 hour(s))  Blood Culture (routine x 2)     Status: Abnormal   Collection Time: 12/11/15  5:00 AM  Result Value Ref Range Status   Specimen Description BLOOD RIGHT ANTECUBITAL DRAWN BY RN  Final   Special Requests BOTTLES DRAWN AEROBIC AND ANAEROBIC 6CC  Final   Culture  Setup Time   Final    GRAM NEGATIVE RODS  RECOVERED FROM BOTH BOTTLES Gram Stain Report Called to,Read Back By and Verified With: DAVIS,K. RN AT [id number] ON 12/11/2015 BY Elza Rafter. Performed at [place] [facility] (A)  Final   Report Status 12/13/2015 FINAL  Final   Organism ID, Bacteria ESCHERICHIA COLI  Final      Susceptibility   Escherichia coli - MIC*    AMPICILLIN >=32 RESISTANT Resistant     CEFAZOLIN 8 SENSITIVE Sensitive     CEFEPIME <=1 SENSITIVE Sensitive     CEFTAZIDIME <=1 SENSITIVE Sensitive     CEFTRIAXONE <=1 SENSITIVE Sensitive     CIPROFLOXACIN 2 INTERMEDIATE Intermediate     GENTAMICIN <=1 SENSITIVE Sensitive     IMIPENEM <=  0.25 SENSITIVE Sensitive     TRIMETH/SULFA >=320 RESISTANT Resistant     AMPICILLIN/SULBACTAM 16 INTERMEDIATE Intermediate     PIP/TAZO <=4 SENSITIVE Sensitive     * ESCHERICHIA COLI  Blood Culture (routine x 2)     Status: Abnormal   Collection Time: 12/11/15  5:05 AM  Result Value Ref Range Status   Specimen Description BLOOD RIGHT ARM DRAWN BY RN  Final   Special Requests BOTTLES DRAWN AEROBIC ONLY [place]  Final   Culture  Setup Time   Final    GRAM NEGATIVE RODS RECOVERED FROM THE AEROBIC BOTTLE. Gram Stain Report Called to,Read Back By and Verified With: DAVIS,K. RN AT [id number] ON 12/11/2015 BY Elza Rafter. Performed at [facility]    Culture (A)  Final    ESCHERICHIA COLI SUSCEPTIBILITIES PERFORMED ON PREVIOUS CULTURE WITHIN THE LAST 5 DAYS. Performed at [facility]    Report Status 12/13/2015 FINAL  Final  Urine culture     Status: Abnormal   Collection Time: 12/11/15  6:30 AM  Result Value Ref Range Status   Specimen Description URINE, CATHETERIZED  Final   Special Requests NONE  Final   Culture >=100,000 COLONIES/mL PSEUDOMONAS AERUGINOSA (A)  Final   Report Status 12/13/2015 FINAL  Final   Organism ID, Bacteria PSEUDOMONAS AERUGINOSA (A)  Final      Susceptibility   Pseudomonas aeruginosa - MIC*    CEFTAZIDIME 2 SENSITIVE Sensitive      CIPROFLOXACIN >=4 RESISTANT Resistant     GENTAMICIN <=1 SENSITIVE Sensitive     IMIPENEM 2 SENSITIVE Sensitive     PIP/TAZO <=4 SENSITIVE Sensitive     CEFEPIME <=1 SENSITIVE Sensitive     * >=100,000 COLONIES/mL PSEUDOMONAS AERUGINOSA     Labs: Basic Metabolic Panel:  Recent Labs Lab 12/11/15 0500  NA 134*  K 4.1  CL 102  CO2 20*  GLUCOSE 171*  BUN 22*  CREATININE 1.29*  CALCIUM 8.8*   Liver Function Tests:  Recent Labs Lab 12/11/15 0500  AST 145*  ALT 54  ALKPHOS 327*  BILITOT 2.5*  PROT 8.2*  ALBUMIN 3.2*    Recent Labs Lab 12/11/15 0745  LIPASE 17   No results for input(s): AMMONIA in the last 168 hours. CBC:  Recent Labs Lab 12/11/15 0500  WBC 22.6*  NEUTROABS 21.9*  HGB 12.2  HCT 37.6  MCV 85.6  PLT 317   Cardiac Enzymes:  Recent Labs Lab 12/11/15 0500 12/11/15 0927 12/11/15 1517  TROPONINI 0.06* 0.09* 0.09*   BNP: BNP (last 3 results) No results for input(s): BNP in the last 8760 hours.  ProBNP (last 3 results) No results for input(s): PROBNP in the last 8760 hours.  CBG:  Recent Labs Lab 12/11/15 2056 12/12/15 0721 12/12/15 1139 12/13/15 0719 12/13/15 1109  GLUCAP 133* 116* 97 108* 108*    Signed:  Atlanta Pelto MD.  [facility] 12/13/2015, 10:30 AM

## 2015-12-15 NOTE — Progress Notes (Signed)
Pt transferred to [facility], with all belongings, and prescriptions.

## 2015-12-15 NOTE — Progress Notes (Signed)
Called report to [facility] nurse.  

## 2015-12-15 NOTE — Progress Notes (Signed)
Pharmacy Antibiotic Note  [name] is a 82 y.o. female admitted on 12/11/2015 with sepsis.  Pharmacy has been consulted for  AZTREONAM dosing.  Plan:  Aztreonam 1gm IV q8h  Pharmacy will sign off as she is now comfort care.  Height: 5\' 3"  (160 cm) Weight: 179 lb 14.3 oz (81.6 kg) IBW/kg (Calculated) : 52.4  Temp (24hrs), Avg:98.5 F (36.9 C), Min:98 F (36.7 C), Max:98.9 F (37.2 C)   Recent Labs Lab 12/11/15 0500 12/11/15 0745  WBC 22.6*  --   CREATININE 1.29*  --   LATICACIDVEN 4.1* 6.2*    Estimated Creatinine Clearance: 28.7 mL/min (by C-G formula based on Cr of 1.29).    Allergies  Allergen Reactions  . Aleve [Naproxen Sodium]   . Carbapenems Other (See Comments)    unknown  . Cephalosporins Other (See Comments)    Unknown/ pt has had rocephin in the past and tolerated  . Codeine Nausea Only  . Amoxicillin Rash  . Penicillins Rash    Antimicrobials this admission: Vancomycin 4/10 >>  Aztreonam 4/10 >>   Results for orders placed or performed during the hospital encounter of 12/11/15  Blood Culture (routine x 2)     Status: Abnormal   Collection Time: 12/11/15  5:00 AM  Result Value Ref Range Status   Specimen Description BLOOD RIGHT ANTECUBITAL DRAWN BY RN  Final   Special Requests BOTTLES DRAWN AEROBIC AND ANAEROBIC 6CC  Final   Culture  Setup Time   Final    GRAM NEGATIVE RODS RECOVERED FROM BOTH BOTTLES Gram Stain Report Called to,Read Back By and Verified With: DAVIS,K. RN AT [id number] ON 12/11/2015 BY BAUGHAM,M. Performed at [place] (A)  Final   Report Status 12/13/2015 FINAL  Final   Organism ID, Bacteria ESCHERICHIA COLI  Final      Susceptibility   Escherichia coli - MIC*    AMPICILLIN >=32 RESISTANT Resistant     CEFAZOLIN 8 SENSITIVE Sensitive     CEFEPIME <=1 SENSITIVE Sensitive     CEFTAZIDIME <=1 SENSITIVE Sensitive     CEFTRIAXONE <=1 SENSITIVE Sensitive     CIPROFLOXACIN 2 INTERMEDIATE  Intermediate     GENTAMICIN <=1 SENSITIVE Sensitive     IMIPENEM <=0.25 SENSITIVE Sensitive     TRIMETH/SULFA >=320 RESISTANT Resistant     AMPICILLIN/SULBACTAM 16 INTERMEDIATE Intermediate     PIP/TAZO <=4 SENSITIVE Sensitive     * ESCHERICHIA COLI  Blood Culture (routine x 2)     Status: Abnormal   Collection Time: 12/11/15  5:05 AM  Result Value Ref Range Status   Specimen Description BLOOD RIGHT ARM DRAWN BY RN  Final   Special Requests BOTTLES DRAWN AEROBIC ONLY [place]  Final   Culture  Setup Time   Final    GRAM NEGATIVE RODS RECOVERED FROM THE AEROBIC BOTTLE. Gram Stain Report Called to,Read Back By and Verified With: DAVIS,K. RN AT [id number] ON 12/11/2015 BY Elza Rafter. Performed at [facility]    Culture (A)  Final    ESCHERICHIA COLI SUSCEPTIBILITIES PERFORMED ON PREVIOUS CULTURE WITHIN THE LAST 5 DAYS. Performed at [facility]    Report Status 12/13/2015 FINAL  Final  Urine culture     Status: Abnormal   Collection Time: 12/11/15  6:30 AM  Result Value Ref Range Status   Specimen Description URINE, CATHETERIZED  Final   Special Requests NONE  Final   Culture >=100,000 COLONIES/mL PSEUDOMONAS AERUGINOSA (A)  Final  Report Status 12/13/2015 FINAL  Final   Organism ID, Bacteria PSEUDOMONAS AERUGINOSA (A)  Final      Susceptibility   Pseudomonas aeruginosa - MIC*    CEFTAZIDIME 2 SENSITIVE Sensitive     CIPROFLOXACIN >=4 RESISTANT Resistant     GENTAMICIN <=1 SENSITIVE Sensitive     IMIPENEM 2 SENSITIVE Sensitive     PIP/TAZO <=4 SENSITIVE Sensitive     CEFEPIME <=1 SENSITIVE Sensitive     * >=100,000 COLONIES/mL PSEUDOMONAS AERUGINOSA   Thank you for allowing pharmacy to be a part of this patient's care.  Excell Seltzer [place] 12/12/2015 8:28 AM

## 2015-12-16 LAB — BASIC METABOLIC PANEL
GLUCOSE: 136 mg/dL
GLUCOSE: 136 mg/dL
Glucose: 116 mg/dL
Glucose: 89 mg/dL

## 2015-12-17 LAB — BASIC METABOLIC PANEL
Glucose: 119 mg/dL
Glucose: 116 mg/dL
Glucose: 145 mg/dL
Glucose: 109 mg/dL

## 2015-12-18 LAB — BASIC METABOLIC PANEL
GLUCOSE: 1356 mg/dL
Glucose: 129 mg/dL
Glucose: 135 mg/dL
Glucose: 147 mg/dL
Glucose: 99 mg/dL

## 2015-12-19 ENCOUNTER — Non-Acute Institutional Stay (SKILLED_NURSING_FACILITY): Payer: Medicare HMO | Admitting: Internal Medicine

## 2015-12-19 ENCOUNTER — Encounter: Payer: Self-pay | Admitting: Internal Medicine

## 2015-12-19 ENCOUNTER — Encounter: Payer: Self-pay | Admitting: Primary Care

## 2015-12-19 DIAGNOSIS — F319 Bipolar disorder, unspecified: Secondary | ICD-10-CM | POA: Insufficient documentation

## 2015-12-19 DIAGNOSIS — N390 Urinary tract infection, site not specified: Secondary | ICD-10-CM

## 2015-12-19 DIAGNOSIS — R6521 Severe sepsis with septic shock: Secondary | ICD-10-CM | POA: Diagnosis not present

## 2015-12-19 DIAGNOSIS — A419 Sepsis, unspecified organism: Secondary | ICD-10-CM

## 2015-12-19 DIAGNOSIS — K810 Acute cholecystitis: Secondary | ICD-10-CM | POA: Diagnosis not present

## 2015-12-19 LAB — BASIC METABOLIC PANEL: GLUCOSE: 100 mg/dL

## 2015-12-19 NOTE — Progress Notes (Unsigned)
Patient ID: [name], female   DOB: June 26, 1922, 82 y.o.   MRN: [id number] "[name]" is resting quietly in bed. She opens her eyes easily as I speak to her and touch her. She denies pain at this point by shaking her head no, but after a brief pause, nods "yes" when I ask if she has anxiety or nervousness.  Conference with [place], regarding anxiolytics.  Call from daughter Marlise Eves, requesting return call or meeting today. I returned to these SNF and daughter Jenny Reichmann is at bedside. We talk at length about the use of morphine and anxiolytics for symptom management.  Conference with Dr. Linna Darner. I will continue to follow [name] to assist this family during this difficult time.

## 2015-12-19 NOTE — Assessment & Plan Note (Signed)
Comfort care measures;DNR

## 2015-12-19 NOTE — Patient Instructions (Signed)
See summary under each active problem in the Problem List with associated updated therapeutic plan. Completed document transmitted to [facility]. 

## 2015-12-19 NOTE — Progress Notes (Signed)
Patient ID: [name], female   DOB: 08-Dec-1921, 82 y.o.   MRN: [id number]    This is a nursing facility follow up for [name] readmission within 30 days. Interim medical record and care since last [facility] visit was updated with review of diagnostic studies and change in clinical status since last visit were documented.  Family and social history are not pertinent to this readmission.  HPI: She was hospitalized in March with sepsis; aerobic and anaerobic blood cultures 3/18 were positive for gram-negative rods. Urine culture revealed Escherichia coli. She was discharged on Ceftin but had to be readmitted with recurrence of acute severe sepsis and septic shock 4/10-4/14 . Blood cultures were positive for Escherichia coli. Urine culture 4/12 was positive for pseudomonas aeruginosa. CT scan of abdomen 11/18/15 had revealed a 4.1 cm mass at the lower pole of the right kidney worrisome for renal cell carcinoma. The family had declined further evaluation. At that time cholelithiasis was present. Imaging was not repeated at the second admission;but clinically she was felt to have acute cholecystitis. She was NOT felt to be a surgical candidate. Family declined IR biliary  tract drainage.  Palliative care consult was obtained and comfort measures introduced.    Comprehensive review of systems: She is nonverbal; she shakes her head no in reference to chest pain,shortness of breath or abdominal pain.  Physical exam:  Pertinent or positive findings: she is very lethargic but will open her eyes to questions. She does not focus on the examiner. She shakes her head slowly side to side indicating a negative reply to questions. Mouth is surprisingly moist. She has a few remaining teeth with good hygiene. Heart sounds are distant and slightly irregular. She has a grade [id number] systolic murmur at the left base. Breath sounds are somewhat decreased. She has very faint homogenous rales.  Bowel sounds are decreased. There is no tenderness even over the right upper abdomen. She has slight lipedema of the ankles but no edema. Pedal pulses are surprisingly strong but irregular.   General appearance:Adequately nourished; no acute distress , increased work of breathing is present.   Lymphatic: No lymphadenopathy about the head, neck, axilla . Eyes: No conjunctival inflammation or lid edema is present. There is no scleral icterus. Ears:  External ear exam shows no significant lesions or deformities.   Nose:  External nasal examination shows no deformity or inflammation. Nasal mucosa are pink and moist without lesions ,exudates Oral exam: lips and gums are healthy appearing.There is no oropharyngeal erythema or exudate . Neck:  No thyromegaly, masses, tenderness noted.    Heart:   S1 and S2 normal without gallop click, rub .  Abdomen:no organomegaly, hernias,masses. GU: deferred as previously addressed. Extremities:  No cyanosis, clubbing,edema  No significant lesions or rash.    See summary under each active problem in the Problem List with associated updated therapeutic plan. As per family wishes comfort care measures will be pursued

## 2015-12-21 ENCOUNTER — Other Ambulatory Visit: Payer: Self-pay | Admitting: *Deleted

## 2015-12-21 MED ORDER — MORPHINE SULFATE (CONCENTRATE) 10 MG /0.5 ML PO SOLN: 10.0000 mg | ORAL | Status: DC | PRN

## 2016-01-01 DEATH — deceased

## 2016-01-10 ENCOUNTER — Non-Acute Institutional Stay (SKILLED_NURSING_FACILITY): Payer: Medicare HMO | Admitting: Internal Medicine

## 2016-01-10 ENCOUNTER — Encounter: Payer: Self-pay | Admitting: Internal Medicine

## 2016-01-10 DIAGNOSIS — J9601 Acute respiratory failure with hypoxia: Secondary | ICD-10-CM

## 2016-01-10 DIAGNOSIS — F0390 Unspecified dementia without behavioral disturbance: Secondary | ICD-10-CM | POA: Diagnosis not present

## 2016-01-10 DIAGNOSIS — R627 Adult failure to thrive: Secondary | ICD-10-CM

## 2016-01-10 DIAGNOSIS — K810 Acute cholecystitis: Secondary | ICD-10-CM

## 2016-01-10 NOTE — Progress Notes (Signed)
Patient ID: ARIAH HERNANDEZPEREZ, female   DOB: 1921/12/06, 82 y.o.   MRN: [id number]   Location:  [facility] Room Number: 138 Place of Service:  SNF [phone]) Provider:  Leeanne Deed, MD  Patient Care Team: Hendricks Limes, MD as PCP - General (Internal Medicine) Satira Sark, MD as Consulting Physician (Cardiology)  Extended Emergency Contact Information Primary Emergency Contact: [name],Pat Address: [address]          [place], [place] [address] [name] of [place] Phone: [phone] Mobile Phone: [phone] Relation: Daughter Secondary Emergency Contact: Fox,Cindy  [place] Phone: [phone] Relation: Daughter  Code Status:  DNR Goals of care: Advanced Directive information Advanced Directives 01/10/2016  Does patient have an advance directive? Yes  Type of Advance Directive Out of facility DNR (pink MOST or yellow form)  Does patient want to make changes to advanced directive? No - Patient declined  Copy of advanced directive(s) in chart? Yes  Pre-existing out of facility DNR order (yellow form or pink MOST form) Yellow form placed in chart (order not valid for inpatient use)     Chief Complaint  Patient presents with  . Medical Management of Chronic Issues    Medical Management of Chronic Issues    HPI:  Pt is a 82 y.o. female seen today for Medical management of chronic issues-she is is essentially under comfort care with history of failure to thrive-she has had recent hospitalizations back in March for sepsis with blood cultures positive for gram-negative rods urine culture showed Escherichia coli.  She was discharged on Ceftin but had to be readmitted with severe sepsis and shock in April.  Blood cultures were positive for Escherichia coli urine culture positive for Pseudomonas .  CT scan back in March showed a mass lower pole the right kidney that was worrisome for renal cell  carcinoma.  Family did not want further evaluation-at that time cold with the ASIS was present and is also.  Imaging was not pursued second admission because of desires for conservative care she was thought to have acute cholecystitis.  She was not a surgical candidate.  Palliative care consult was comes all did during the most recent hospital admission comfort care measures were introduced.  Her medications have been minimize-she appears to be stable she has apparently better days where she eats and drinks better and is more responsive and other days where she basically apparently is nonverbal and does not eat and drink well.  Family understands this and they want again comfort measures and she does appear to be comfortable today.  Vital signs actually appear to be stable.     Past Medical History  Diagnosis Date  . Breast cancer ([facility])   . Essential hypertension, benign   . Depression   . GERD (gastroesophageal reflux disease)   . Hypercholesteremia   . CKD (chronic kidney disease) stage 3, GFR 30-59 ml/min   . Pneumonia   . UTI (lower urinary tract infection)   . Arthritis   . Asthma   . Venous stasis   . Type 2 diabetes mellitus ([facility])   . Chronic diastolic heart failure (HCC)     LVEF 70%  . Mitral stenosis     Moderate  . Secondary pulmonary hypertension (HCC)     PASP 64 mmHg  . CHF (congestive heart failure) ([facility])   . Choledocholithiasis 11/19/2015   Past Surgical History  Procedure Laterality Date  .  Replacement total knee    . Mastectomy    . Total hip arthroplasty    . Carotid endarterectomy    . Cataracts      Allergies  Allergen Reactions  . Aleve [Naproxen Sodium]   . Carbapenems Other (See Comments)    unknown  . Cephalosporins Other (See Comments)    Unknown/ pt has had rocephin in the past and tolerated  . Codeine Nausea Only  . Amoxicillin Rash  . Penicillins Rash      Medication List    Notice    This visit is during an admission.  Changes to the med list made in this visit will be reflected in the After Visit Summary of the admission.     Outpatient Encounter Prescriptions as of 01/10/2016  Medication Sig  . acetaminophen (TYLENOL) 325 MG tablet Take 325 mg by mouth every 6 (six) hours as needed for mild pain.   Marland Kitchen albuterol (PROVENTIL HFA;VENTOLIN HFA) 108 (90 BASE) MCG/ACT inhaler Inhale 2 puffs into the lungs every 6 (six) hours as needed for wheezing or shortness of breath.  . diphenhydrAMINE (BENADRYL) 25 MG tablet Take 12.5 mg by mouth every 8 (eight) hours as needed for itching.  Marland Kitchen LORazepam (ATIVAN) 0.5 MG tablet Take one tablet by mouth every 8 hours as needed  . magnesium hydroxide (MILK OF MAGNESIA) 400 MG/5ML suspension Take 30 mLs by mouth daily as needed for mild constipation.  . Morphine Sulfate (MORPHINE CONCENTRATE) 10 mg / 0.5 ml concentrated solution Take 0.5 mLs (10 mg total) by mouth every 3 (three) hours as needed for severe pain.  Marland Kitchen nystatin cream (MYCOSTATIN) Apply topical to perineal area twice daily and as needed for rash  . phenol (CHLORASEPTIC) 1.4 % LIQD Use as directed 2 sprays in the mouth or throat as needed for throat irritation / pain.  Vladimir Faster Glycol-Propyl Glycol (SYSTANE OP) Place 1 drop into both eyes 2 (two) times daily.  Marland Kitchen zinc oxide (BALMEX) 11.3 % CREA cream Apply every shift as needed  . [DISCONTINUED] ENSURE (ENSURE) Take 237 mLs by mouth 2 (two) times daily between meals. Please mix with ice cream.  . [DISCONTINUED] Wound Dressings (ALLEVYN AG NON-ADHESIVE) PADS Apply to buttocks as needed and change daily   No facility-administered encounter medications on file as of 01/10/2016.    Review of Systems   This is essentially unattainable-please see above-apparently she has days or she eats other days or she doesn't do so well-she does complain at times of some buttocks pain and arm pain-this appears to be controlled with the Roxanol as needed.    Immunization History    Administered Date(s) Administered  . Influenza Split 05/26/2013  . Influenza-Unspecified 06/08/2014, 06/03/2015  . PPD Test 09/21/2013  . Pneumococcal-Unspecified 06/03/2015   Pertinent  Health Maintenance Due  Topic Date Due  . OPHTHALMOLOGY EXAM  06/18/1934  . URINE MICROALBUMIN  06/18/1934  . DEXA SCAN  06/18/1989  . FOOT EXAM  09/06/2014  . INFLUENZA VACCINE  04/02/2016  . PNA vac Low Risk Adult (2 of 2 - PCV13) 06/02/2016  . HEMOGLOBIN A1C  06/11/2016   Fall Risk  01/10/2016  Falls in the past year? Exclusion - non ambulatory   Functional Status Survey:    Filed Vitals:   01/10/16 1355  BP: 131/54  Pulse: 84  Temp: 98.8 F (37.1 C)  TempSrc: Oral  Height: 5\' 5"  (1.651 m)  Weight: 166 lb 12.8 oz (75.66 kg)   Body mass index is  27.76 kg/(m^2). 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  She is alert responsive although is not speaking much her eyes are open she does make eye contact.  Continues to shake her head at times to indicate yes or no.  Her skin is warm and dry she does have numerous seborrheic keratosis I did not note any open lesions of the buttocks there is minimal erythema and wound care is applying cream to this.  Eyes pupils appear reactive light visual acuity appears grossly intact.  Oropharynx mucous membranes actually appears fairly moist.  Chest is clear to auscultation with reduced breath sounds.  Heart is regular rate and rhythm with a height say 2/6 systolic murmur she has trace pedal edema this actually appears somewhat better than I have seen in the past.  Abdomen is soft does not appear to be tender to palpation there are active bowel sounds.  Musculoskeletal does move all her extremities 4 with lower extremity weakness does have some pain with movement of her shoulder area on the right but this is not new I do not note any  deformities.  Neurologic she is speaking some although not much she does make eye contact cranial nerves appear grossly intact I do not see any lateralizing findings.  Psych again she has significant dementia failure to thrive but she is not agitated today.    Labs reviewed:  Recent Labs  11/18/15 0702 11/19/15 0704 11/20/15 0920 12/11/15 0500  NA  --  138 136 134*  K  --  4.2 4.6 4.1  CL  --  109 108 102  CO2  --  22 22 20*  GLUCOSE  --  157* 134* 171*  BUN  --  24* 21* 22*  CREATININE  --  1.09* 1.04* 1.29*  CALCIUM  --  8.4* 8.3* 8.8*  MG 1.5*  --   --   --     Recent Labs  11/19/15 0704 11/20/15 0920 12/11/15 0500  AST 61* 38 145*  ALT 74* 54 54  ALKPHOS 138* 145* 327*  BILITOT 1.6* 0.8 2.5*  PROT 6.4* 6.2* 8.2*  ALBUMIN 2.8* 2.6* 3.2*    Recent Labs  11/17/15 2315 11/18/15 0702 11/19/15 0704 11/20/15 0920 12/11/15 0500  WBC 28.7* 19.1* 13.0* 9.6 22.6*  NEUTROABS 25.4* 16.5*  --   --  21.9*  HGB 12.3 11.1* 10.9* 10.4* 12.2  HCT 38.1 34.5* 33.3* 32.3* 37.6  MCV 88.0 88.7 87.9 89.0 85.6  PLT 185 162 152 158 317   Lab Results  Component Value Date   TSH 3.775 04/28/2015   Lab Results  Component Value Date   HGBA1C 6.4* 12/11/2015   Lab Results  Component Value Date   CHOL 157 09/10/2013   HDL 32* 09/10/2013   LDLCALC 90 09/10/2013   TRIG 176* 09/10/2013   CHOLHDL 4.9 09/10/2013    Significant Diagnostic Results in last 30 days:  No results found.  Assessment/Plan  #1-history of failure to thrive-patient is essentially under comfort measures with minimal medications-for pain she does receive Roxanol as needed and apparently this is effective she does not appear to be uncomfortable today.  She has good days and not so good days-but generally appears to be stable-.  #2 history of cholecystitis-this appears to be relatively asymptomatic again emphasis is on comfort care no aggressive workup desired.  #3 history of sepsis with UTIs and  bacteremia in the past-she has been afebrile does not give a septic presentation today again family very conservative with any treatment here.  #4 history of COPD she does continue on when necessary nebulizers apparently this is been relatively stable certainly remains at risk here as well as-again family does not desire any further rehospitalization  . #5 history diabetes type 2 currently on no medications CBGs have been in the low 100s per nursing she was on a minimal doselong acting insulin previously but with poor appetite certainly would be concerned about hypoglycemic  #6 history dementia-continues with supportive care she does have Ativan as needed for situational anxiety    Clinically patient appears to be stable by mouth intake has been encouraged   [phone]

## 2016-01-16 ENCOUNTER — Encounter: Payer: Self-pay | Admitting: Internal Medicine

## 2016-01-16 ENCOUNTER — Non-Acute Institutional Stay (SKILLED_NURSING_FACILITY): Payer: Medicare HMO | Admitting: Internal Medicine

## 2016-01-16 DIAGNOSIS — R627 Adult failure to thrive: Secondary | ICD-10-CM | POA: Diagnosis not present

## 2016-01-16 DIAGNOSIS — F0390 Unspecified dementia without behavioral disturbance: Secondary | ICD-10-CM

## 2016-01-16 NOTE — Progress Notes (Signed)
Location:  [facility] Room Number: 138 Place of Service:  SNF [phone]) Provider:  Leeanne Deed, MD  Patient Care Team: Hendricks Limes, MD as PCP - General (Internal Medicine) Satira Sark, MD as Consulting Physician (Cardiology)  Extended Emergency Contact Information Primary Emergency Contact: [name],Pat Address: [address]          [place], [place] [address] [name] of [place] Phone: [phone] Mobile Phone: [phone] Relation: Daughter Secondary Emergency Contact: Fox,Cindy  [place] Phone: [phone] Relation: Daughter  Code Status:  DNR Goals of care: Advanced Directive information Advanced Directives 01/16/2016  Does patient have an advance directive? Yes  Type of Advance Directive Out of facility DNR (pink MOST or yellow form)  Does patient want to make changes to advanced directive? No - Patient declined  Copy of advanced directive(s) in chart? Yes  Pre-existing out of facility DNR order (yellow form or pink MOST form) -     Chief Complaint  Patient presents with  . Acute Visit   Family conference in regards to end-of-life care  Pt is a 82 y.o. female seen today for an acute visit for  discussion of end-of-life care with patient's daughter.  Family requested I evaluate patient to assess discomfort status.  Per family patient appears to be stable she really uses her morphine per daughter has taken it about 2 times recently.  This is been done mainly for pain and respiratory issue she does have a history COPD he has when necessary nebulizers on board if needed.  According to her daughter the morphine has been effective with relieving symptoms not overly sedating her mother.  Family does not really express any other concerns other than they have noticed some left wrist discomfort at times.  Patient cannot really give any review of systems she does attempt to speak at times  this is more so when she recognizes her daughter.  Vital signs appear to be stable  Patient has multiple medical issues including progressing dementia-COPD with history of respiratory failure pneumonia-also has a renal mass that family has desired no workup again comfort care essentially   Past Medical History  Diagnosis Date  . Breast cancer ([facility])   . Essential hypertension, benign   . Depression   . GERD (gastroesophageal reflux disease)   . Hypercholesteremia   . CKD (chronic kidney disease) stage 3, GFR 30-59 ml/min   . Pneumonia   . UTI (lower urinary tract infection)   . Arthritis   . Asthma   . Venous stasis   . Type 2 diabetes mellitus ([facility])   . Chronic diastolic heart failure (HCC)     LVEF 70%  . Mitral stenosis     Moderate  . Secondary pulmonary hypertension (HCC)     PASP 64 mmHg  . CHF (congestive heart failure) ([facility])   . Choledocholithiasis 11/19/2015   Past Surgical History  Procedure Laterality Date  . Replacement total knee    . Mastectomy    . Total hip arthroplasty    . Carotid endarterectomy    . Cataracts      Allergies  Allergen Reactions  . Aleve [Naproxen Sodium]   . Carbapenems Other (See Comments)    unknown  . Cephalosporins Other (See Comments)    Unknown/ pt has had rocephin in the past and tolerated  . Codeine Nausea Only  . Amoxicillin Rash  . Penicillins Rash    Current Outpatient Prescriptions  on File Prior to Visit  Medication Sig Dispense Refill  . acetaminophen (TYLENOL) 325 MG tablet Take 325 mg by mouth every 6 (six) hours as needed for mild pain.     Marland Kitchen albuterol (PROVENTIL HFA;VENTOLIN HFA) 108 (90 BASE) MCG/ACT inhaler Inhale 2 puffs into the lungs every 6 (six) hours as needed for wheezing or shortness of breath.    . diphenhydrAMINE (BENADRYL) 25 MG tablet Take 12.5 mg by mouth every 8 (eight) hours as needed for itching.    Marland Kitchen LORazepam (ATIVAN) 0.5 MG tablet Take one tablet by mouth every 8 hours as needed    .  magnesium hydroxide (MILK OF MAGNESIA) 400 MG/5ML suspension Take 30 mLs by mouth daily as needed for mild constipation.    . Morphine Sulfate (MORPHINE CONCENTRATE) 10 mg / 0.5 ml concentrated solution Take 0.5 mLs (10 mg total) by mouth every 3 (three) hours as needed for severe pain. 30 mL 0  . nystatin cream (MYCOSTATIN) Apply topical to perineal area twice daily and as needed for rash    . phenol (CHLORASEPTIC) 1.4 % LIQD Use as directed 2 sprays in the mouth or throat as needed for throat irritation / pain.    Vladimir Faster Glycol-Propyl Glycol (SYSTANE OP) Place 1 drop into both eyes 2 (two) times daily.    Marland Kitchen zinc oxide (BALMEX) 11.3 % CREA cream Apply every shift as needed     No current facility-administered medications on file prior to visit.     Review of Systems----essentially unattainable please see history of present illness   Immunization History  Administered Date(s) Administered  . Influenza Split 05/26/2013  . Influenza-Unspecified 06/08/2014, 06/03/2015  . PPD Test 09/21/2013  . Pneumococcal-Unspecified 06/03/2015   Pertinent  Health Maintenance Due  Topic Date Due  . FOOT EXAM  01/15/2017 (Originally 09/06/2014)  . OPHTHALMOLOGY EXAM  01/15/2017 (Originally 06/18/1934)  . URINE MICROALBUMIN  01/15/2017 (Originally 06/18/1934)  . DEXA SCAN  01/15/2017 (Originally 06/18/1989)  . INFLUENZA VACCINE  04/02/2016  . PNA vac Low Risk Adult (2 of 2 - PCV13) 06/02/2016  . HEMOGLOBIN A1C  06/11/2016   Fall Risk  01/10/2016  Falls in the past year? Exclusion - non ambulatory   Functional Status Survey:    Filed Vitals:   01/16/16 1607  BP: 150/74  Pulse: 100  Temp: 98.2 F (36.8 C)  TempSrc: Oral  Resp: 20  Height: 5\' 5"  (1.651 m)  Weight: 166 lb 12.8 oz (75.66 kg)   Body mass index is 27.76 kg/(m^2). Physical Exam   In general this is a frail elderly female in no distress she is resting currently in bed with her eyes closed but will open her eyes to verbal  commands an attempt to speak at times.  Her skin is warm and dry.  Eyes appear reactive to light.  Oropharynx mucous membranes are slightly dry oropharynx is clear.  Chest she has shallow air entry at could not appreciate any labored breathing or overt congestion.  Heart is regular rate and rhythm without murmur gallop or rub she has trace lower extremity edema which appears to be severely reduced from her baseline.  Her abdomen is obese soft nontender with positive bowel sounds.  Musculoskeletal is extremely weak but is able to move her extremities there is some pain with palpation of her left wrist when it is not palpated however she does not appear to be really uncomfortable at this point there is a positive radial pulse I do not note  any increased erythema or edema.  Neurologic she is responsive to verbal stimuli will attempt speak to her daughter often has a somewhat staring into space looked but does respond.    Labs reviewed:  Recent Labs  11/18/15 0702 11/19/15 0704 11/20/15 0920 12/11/15 0500  NA  --  138 136 134*  K  --  4.2 4.6 4.1  CL  --  109 108 102  CO2  --  22 22 20*  GLUCOSE  --  157* 134* 171*  BUN  --  24* 21* 22*  CREATININE  --  1.09* 1.04* 1.29*  CALCIUM  --  8.4* 8.3* 8.8*  MG 1.5*  --   --   --     Recent Labs  11/19/15 0704 11/20/15 0920 12/11/15 0500  AST 61* 38 145*  ALT 74* 54 54  ALKPHOS 138* 145* 327*  BILITOT 1.6* 0.8 2.5*  PROT 6.4* 6.2* 8.2*  ALBUMIN 2.8* 2.6* 3.2*    Recent Labs  11/17/15 2315 11/18/15 0702 11/19/15 0704 11/20/15 0920 12/11/15 0500  WBC 28.7* 19.1* 13.0* 9.6 22.6*  NEUTROABS 25.4* 16.5*  --   --  21.9*  HGB 12.3 11.1* 10.9* 10.4* 12.2  HCT 38.1 34.5* 33.3* 32.3* 37.6  MCV 88.0 88.7 87.9 89.0 85.6  PLT 185 162 152 158 317   Lab Results  Component Value Date   TSH 3.775 04/28/2015   Lab Results  Component Value Date   HGBA1C 6.4* 12/11/2015   Lab Results  Component Value Date   CHOL 157  09/10/2013   HDL 32* 09/10/2013   LDLCALC 90 09/10/2013   TRIG 176* 09/10/2013   CHOLHDL 4.9 09/10/2013    Significant Diagnostic Results in last 30 days:  No results found.  Assessment/Plan #1 failure to thrive end-of-life care-at this point patient appears to be stable and comfortable-she does have when necessary morphine as needed although this is rarely used at this point-she also has as needed Ativan-apparently both these have been effective in keeping patient comfortable at this time-respiratory wise she appears to be stable still is when necessary nebulizers but appears to morphine has been effective for any respiratory issues along with the Ativan.  As far as left wrist pain Will monitor this again is a risk does not move she does not appear to be in discomfort will encourage elevation of the wrist and see if this helps.  This all was discussed with family is comfortable with current plan of care again essentially comfort care.  [id number] note greater than 25 minutes spent assessing patient-discussing her status with nursing staff and daughter in the [facility] coordinating plan of care with family and nursing input      Oralia Manis, [place]

## 2016-01-19 ENCOUNTER — Encounter: Payer: Self-pay | Admitting: Internal Medicine

## 2016-01-19 ENCOUNTER — Non-Acute Institutional Stay (SKILLED_NURSING_FACILITY): Payer: Medicare HMO | Admitting: Internal Medicine

## 2016-01-19 DIAGNOSIS — J9601 Acute respiratory failure with hypoxia: Secondary | ICD-10-CM

## 2016-01-19 DIAGNOSIS — M25532 Pain in left wrist: Secondary | ICD-10-CM

## 2016-01-19 DIAGNOSIS — R627 Adult failure to thrive: Secondary | ICD-10-CM | POA: Diagnosis not present

## 2016-01-19 NOTE — Progress Notes (Signed)
Location:  [facility] Room Number: 138 Place of Service:  SNF [phone]) Provider:  Leeanne Deed, MD  Patient Care Team: Hendricks Limes, MD as PCP - General (Internal Medicine) Satira Sark, MD as Consulting Physician (Cardiology)  Extended Emergency Contact Information Primary Emergency Contact: [name],Pat Address: [address]          [place], [place] [address] [name] of [place] Phone: [phone] Mobile Phone: [phone] Relation: Daughter Secondary Emergency Contact: Fox,Cindy  [place] Phone: [phone] Relation: Daughter   Goals of care: Advanced Directive information Advanced Directives 01/19/2016  Does patient have an advance directive? Yes  Type of Advance Directive Out of facility DNR (pink MOST or yellow form)  Does patient want to make changes to advanced directive? No - Patient declined  Copy of advanced directive(s) in chart? Yes     Chief complaint-acute visit secondary to question aspiration  HPI:  Pt is a 82 y.o. female seen today for an acute visit for concerns patient aspirated while eating a hot all.  She was being fed a hot dog via nurse tach when apparently she started coughing and gagging .-I was called--by the time I got to the room patient appeared to be resting comfortably there was no sign of distress--she appeared to be essentially at her baseline.  Her lung sounds were clear although quite shallow with poor respiratory effort.  Patient was seen earlier this week and her medications substantially minimize secondary to comfort care status-she is essentially on nebulizers as needed she is also on Ativan as needed and Roxanol as needed for pain.  She also had complaints of left wrist pain when I saw her earlier but this appears to have largely resolved nursing staff is elevating her arm and this appears to be helping.  Family does not report any other acute  concerns-they are in the room and are very attentive.     Past Medical History  Diagnosis Date  . Breast cancer ([facility])   . Essential hypertension, benign   . Depression   . GERD (gastroesophageal reflux disease)   . Hypercholesteremia   . CKD (chronic kidney disease) stage 3, GFR 30-59 ml/min   . Pneumonia   . UTI (lower urinary tract infection)   . Arthritis   . Asthma   . Venous stasis   . Type 2 diabetes mellitus ([facility])   . Chronic diastolic heart failure (H[facility])     LVEF 70%  . Mitral stenosis     Moderate  . Secondary pulmonary hypertension (H[facility])     PASP 64 mmHg  . CHF (congestive heart failure) ([facility])   . Choledocholithiasis 11/19/2015   Past Surgical History  Procedure Laterality Date  . Replacement total knee    . Mastectomy    . Total hip arthroplasty    . Carotid endarterectomy    . Cataracts      Allergies  Allergen Reactions  . Aleve [Naproxen Sodium]   . Carbapenems Other (See Comments)    unknown  . Cephalosporins Other (See Comments)    Unknown/ pt has had rocephin in the past and tolerated  . Codeine Nausea Only  . Amoxicillin Rash  . Penicillins Rash    Current Outpatient Prescriptions on File Prior to Visit  Medication Sig Dispense Refill  . acetaminophen (TYLENOL) 325 MG tablet Take 325 mg by mouth every 6 (six) hours as needed for mild pain.     Marland Kitchen  albuterol (PROVENTIL HFA;VENTOLIN HFA) 108 (90 BASE) MCG/ACT inhaler Inhale 2 puffs into the lungs every 6 (six) hours as needed for wheezing or shortness of breath.    . diphenhydrAMINE (BENADRYL) 25 MG tablet Take 12.5 mg by mouth every 8 (eight) hours as needed for itching.    Marland Kitchen LORazepam (ATIVAN) 0.5 MG tablet Take one tablet by mouth every 8 hours as needed    . magnesium hydroxide (MILK OF MAGNESIA) 400 MG/5ML suspension Take 30 mLs by mouth daily as needed for mild constipation.    . Morphine Sulfate (MORPHINE CONCENTRATE) 10 mg / 0.5 ml concentrated solution Take 0.5 mLs (10 mg total) by mouth  every 3 (three) hours as needed for severe pain. 30 mL 0  . nystatin cream (MYCOSTATIN) Apply topical to perineal area twice daily and as needed for rash    . phenol (CHLORASEPTIC) 1.4 % LIQD Use as directed 2 sprays in the mouth or throat as needed for throat irritation / pain. Every shift PRN    . Polyethyl Glycol-Propyl Glycol (SYSTANE OP) Place 1 drop into both eyes 2 (two) times daily.    Marland Kitchen zinc oxide (BALMEX) 11.3 % CREA cream Apply every shift as needed     No current facility-administered medications on file prior to visit.     Review of Systems   Very limited secondary to dementia please see history of present illness again at this point I do not see signs of distress pain apparently is controlled with morphine as needed although I don't believe this is very often  Immunization History  Administered Date(s) Administered  . Influenza Split 05/26/2013  . Influenza-Unspecified 06/08/2014, 06/03/2015  . PPD Test 09/21/2013  . Pneumococcal-Unspecified 06/03/2015   Pertinent  Health Maintenance Due  Topic Date Due  . FOOT EXAM  01/15/2017 (Originally 09/06/2014)  . OPHTHALMOLOGY EXAM  01/15/2017 (Originally 06/18/1932)  . URINE MICROALBUMIN  01/15/2017 (Originally 06/18/1932)  . DEXA SCAN  01/15/2017 (Originally 06/18/1989)  . INFLUENZA VACCINE  04/02/2016  . PNA vac Low Risk Adult (2 of 2 - PCV13) 06/02/2016  . HEMOGLOBIN A1C  06/11/2016   Fall Risk  01/10/2016  Falls in the past year? Exclusion - non ambulatory   Functional Status Survey:    There were no vitals filed for this visit. There is no weight on file to calculate BMI. Physical Exam   In general this is a frail elderly female in no distress resting comfortably in bed appears to be sleeping but easily arousable and will put her eyes and actually responded verbal commands with some prompting.  Her skin is warm and dry she is not diaphoretic.  Oropharynx is clear mucous membranes actually appear fairly  moist.  Chest she has shallow air entry but I could not really appreciate any overt congestion there is no sign of labored breathing.  Heart is regular rate and rhythm without murmur gallop or rub.  Abdomen soft nontender positive bowel sounds.  Muscle-skeletal has significant weakness of upper t and lower extremities she appears to be declining-her edema appears to be significantly improved lower extremities  Neurologic-patient appears to be somewhat lethargic but this has been somewhat more common recently this is no different than previous exams she is easily arousable does respond to verbal commands.  Psych as noted above.    Labs reviewed:  Recent Labs  11/18/15 0702 11/19/15 0704 11/20/15 0920 12/11/15 0500  NA  --  138 136 134*  K  --  4.2 4.6 4.1  CL  --  109 108 102  CO2  --  22 22 20*  GLUCOSE  --  157* 134* 171*  BUN  --  24* 21* 22*  CREATININE  --  1.09* 1.04* 1.29*  CALCIUM  --  8.4* 8.3* 8.8*  MG 1.5*  --   --   --     Recent Labs  11/19/15 0704 11/20/15 0920 12/11/15 0500  AST 61* 38 145*  ALT 74* 54 54  ALKPHOS 138* 145* 327*  BILITOT 1.6* 0.8 2.5*  PROT 6.4* 6.2* 8.2*  ALBUMIN 2.8* 2.6* 3.2*    Recent Labs  11/17/15 2315 11/18/15 0702 11/19/15 0704 11/20/15 0920 12/11/15 0500  WBC 28.7* 19.1* 13.0* 9.6 22.6*  NEUTROABS 25.4* 16.5*  --   --  21.9*  HGB 12.3 11.1* 10.9* 10.4* 12.2  HCT 38.1 34.5* 33.3* 32.3* 37.6  MCV 88.0 88.7 87.9 89.0 85.6  PLT 185 162 152 158 317   Lab Results  Component Value Date   TSH 3.775 04/28/2015   Lab Results  Component Value Date   HGBA1C 6.4* 12/11/2015   Lab Results  Component Value Date   CHOL 157 09/10/2013   HDL 32* 09/10/2013   LDLCALC 90 09/10/2013   TRIG 176* 09/10/2013   CHOLHDL 4.9 09/10/2013    Significant Diagnostic Results in last 30 days:  No results found.  Assessment/Plan 1 question aspiration-I do not really see evidence of that at bedside at this point continue to  monitor-per discussion with nursing and family she does not appear to tolerate this regular diet very well anymore I suspect this will need to be downgraded-will consult with speech therapies about downgrading this-at this point monitor.  In regards failure to thrive she does have Roxanol Ativan as needed which I feel is appropriate she also has when necessary nebulizers on hand if needed- does not appear to be in any distress or discomfort currently.  In regards to left wrist pain and this actually appears to be improved with the elevation.  [place], [place], [place]

## 2016-01-22 ENCOUNTER — Other Ambulatory Visit: Payer: Self-pay | Admitting: *Deleted

## 2016-01-22 DIAGNOSIS — I519 Heart disease, unspecified: Secondary | ICD-10-CM | POA: Diagnosis not present

## 2016-01-22 DIAGNOSIS — J449 Chronic obstructive pulmonary disease, unspecified: Secondary | ICD-10-CM | POA: Diagnosis not present

## 2016-01-22 DIAGNOSIS — R630 Anorexia: Secondary | ICD-10-CM | POA: Diagnosis not present

## 2016-01-22 DIAGNOSIS — E119 Type 2 diabetes mellitus without complications: Secondary | ICD-10-CM | POA: Diagnosis not present

## 2016-01-22 DIAGNOSIS — M1380 Other specified arthritis, unspecified site: Secondary | ICD-10-CM | POA: Diagnosis not present

## 2016-01-22 DIAGNOSIS — R1310 Dysphagia, unspecified: Secondary | ICD-10-CM | POA: Diagnosis not present

## 2016-01-22 DIAGNOSIS — R531 Weakness: Secondary | ICD-10-CM | POA: Diagnosis not present

## 2016-01-22 DIAGNOSIS — I10 Essential (primary) hypertension: Secondary | ICD-10-CM | POA: Diagnosis not present

## 2016-01-22 DIAGNOSIS — K219 Gastro-esophageal reflux disease without esophagitis: Secondary | ICD-10-CM | POA: Diagnosis not present

## 2016-01-22 DIAGNOSIS — E7800 Pure hypercholesterolemia, unspecified: Secondary | ICD-10-CM | POA: Diagnosis not present

## 2016-01-22 DIAGNOSIS — N183 Chronic kidney disease, stage 3 (moderate): Secondary | ICD-10-CM | POA: Diagnosis not present

## 2016-01-22 MED ORDER — LORAZEPAM 1 MG PO TABS: 1.0000 mg | ORAL_TABLET | ORAL | Status: AC | PRN

## 2016-01-23 ENCOUNTER — Other Ambulatory Visit: Payer: Self-pay

## 2016-01-23 MED ORDER — MORPHINE SULFATE (CONCENTRATE) 10 MG /0.5 ML PO SOLN: ORAL | Status: AC

## 2016-01-23 NOTE — Telephone Encounter (Signed)
RX faxed to [facility] @ [phone]. Phone number [phone]  

## 2016-01-25 ENCOUNTER — Other Ambulatory Visit: Payer: Self-pay | Admitting: *Deleted

## 2016-01-25 MED ORDER — MORPHINE SULFATE (CONCENTRATE) 20 MG/ML PO SOLN: ORAL | Status: AC

## 2016-01-25 NOTE — Telephone Encounter (Signed)
[facility]  

## 2016-01-26 ENCOUNTER — Non-Acute Institutional Stay (SKILLED_NURSING_FACILITY): Payer: Medicare Other | Admitting: Internal Medicine

## 2016-01-26 DIAGNOSIS — R627 Adult failure to thrive: Secondary | ICD-10-CM

## 2016-01-26 DIAGNOSIS — Z515 Encounter for palliative care: Secondary | ICD-10-CM

## 2016-01-26 NOTE — Progress Notes (Signed)
Patient ID: [name], female   DOB: 07-02-1922, 82 y.o.   MRN: [id number]  Location:  [facility]   Place of Service:  SNF [phone]) Provider:  [place] Hamilton  Taylor Cobble, MD  Patient Care Team: Hendricks Limes, MD as PCP - General (Internal Medicine) Taylor Sark, MD as Consulting Physician (Cardiology)  Extended Emergency Contact Information Primary Emergency Contact: [name],[name] Address: [address]          [place], [place] [address] [name] of [place] Phone: [phone] Mobile Phone: [phone] Relation: Daughter Secondary Emergency Contact: [name],[name]  [place] Phone: [phone] Relation: Daughter   Goals of care: Advanced Directive information Advanced Directives 01/19/2016  Does patient have an advance directive? Yes  Type of Advance Directive Out of facility DNR (pink MOST or yellow form)  Does patient want to make changes to advanced directive? No - Patient declined  Copy of advanced directive(s) in chart? Yes     Chief complaint-acute visit Follow-up failure to thrive-end-of-life care  HPI:  Pt is a 82 y.o. female seen today for an acute visit for follow-up of an L5 care with a history of failure to thrive.  Patient has been a long-term resident of this facility has had a gradual decline precipitated by recurrent pneumonia UTIs complicated with progressing dementia.  She is now on minimal medications basically morphine as well as Ativan and oxygen as needed.  Family thought she was having some increased distress earlier today-and the morphine has been made routine every 4 hours as well as every 2 hours when necessary and this appears to be helping significantly-she also has Ativan as needed.  Currently she does not have oxygen on her O2 saturations actually are in the 90s she appears comfortable nonetheless although oxygen will have to be available if deemed necessary.    .     Past Medical  History  Diagnosis Date  . Breast cancer ([place])   . Essential hypertension, benign   . Depression   . GERD (gastroesophageal reflux disease)   . Hypercholesteremia   . CKD (chronic kidney disease) stage 3, GFR 30-59 ml/min   . Pneumonia   . UTI (lower urinary tract infection)   . Arthritis   . Asthma   . Venous stasis   . Type 2 diabetes mellitus ([place])   . Chronic diastolic heart failure (HCC)     LVEF 70%  . Mitral stenosis     Moderate  . Secondary pulmonary hypertension (HCC)     PASP 64 mmHg  . CHF (congestive heart failure) ([place])   . Choledocholithiasis 11/19/2015   Past Surgical History  Procedure Laterality Date  . Replacement total knee    . Mastectomy    . Total hip arthroplasty    . Carotid endarterectomy    . Cataracts      Allergies  Allergen Reactions  . Aleve [Naproxen Sodium]   . Carbapenems Other (See Comments)    unknown  . Cephalosporins Other (See Comments)    Unknown/ pt has had rocephin in the past and tolerated  . Codeine Nausea Only  . Amoxicillin Rash  . Penicillins Rash    Current Outpatient Prescriptions on File Prior to Visit  Medication Sig Dispense Refill  . acetaminophen (TYLENOL) 325 MG tablet Take 325 mg by mouth every 6 (six) hours as needed for mild pain.     Marland Kitchen albuterol (PROVENTIL HFA;VENTOLIN HFA) 108 (90 BASE) MCG/ACT inhaler Inhale 2 puffs  into the lungs every 6 (six) hours as needed for wheezing or shortness of breath.    . diphenhydrAMINE (BENADRYL) 25 MG tablet Take 12.5 mg by mouth every 8 (eight) hours as needed for itching.    Marland Kitchen LORazepam (ATIVAN) 0.5 MG tablet Take one tablet by mouth every 8 hours as needed    . LORazepam (ATIVAN) 1 MG tablet Take 1 tablet (1 mg total) by mouth every 2 (two) hours as needed for anxiety. 60 tablet 0  . magnesium hydroxide (MILK OF MAGNESIA) 400 MG/5ML suspension Take 30 mLs by mouth daily as needed for mild constipation.    Marland Kitchen morphine (ROXANOL) 20 MG/ML concentrated solution Give 0.9ml by  mouth every 2 hours as needed for apin 240 mL 0  . Morphine Sulfate (MORPHINE CONCENTRATE) 10 mg / 0.5 ml concentrated solution Give 0.5 mL  (10mg ) by mouth every 2 hours as needed 30 mL 0  . nystatin cream (MYCOSTATIN) Apply topical to perineal area twice daily and as needed for rash    . phenol (CHLORASEPTIC) 1.4 % LIQD Use as directed 2 sprays in the mouth or throat as needed for throat irritation / pain. Every shift PRN    . Polyethyl Glycol-Propyl Glycol (SYSTANE OP) Place 1 drop into both eyes 2 (two) times daily.    Marland Kitchen zinc oxide (BALMEX) 11.3 % CREA cream Apply every shift as needed     No current facility-administered medications on file prior to visit.     Review of Systems    Essentially unattainable please see history of present illness  Immunization History  Administered Date(s) Administered  . Influenza Split 05/26/2013  . Influenza-Unspecified 06/08/2014, 06/03/2015  . PPD Test 09/21/2013  . Pneumococcal-Unspecified 06/03/2015   Pertinent  Health Maintenance Due  Topic Date Due  . FOOT EXAM  01/15/2017 (Originally 09/06/2014)  . OPHTHALMOLOGY EXAM  01/15/2017 (Originally 06/18/1932)  . URINE MICROALBUMIN  01/15/2017 (Originally [birth year]18/1932)  . DEXA SCAN  01/15/2017 (Originally 06/18/1989)  . INFLUENZA VACCINE  04/02/2016  . PNA vac Low Risk Adult (2 of 2 - PCV13) 06/02/2016  . HEMOGLOBIN A1C  06/11/2016   Fall Risk  01/10/2016  Falls in the past year? Exclusion - non ambulatory   Functional Status Survey:    Filed Vitals:   01/28/16 2018  BP: 120/69  Pulse: 72  Resp: 12    Physical Exam   In general this is a frail elderly female in no distress resting comfortablyShe appears increasingly weak is not really responsive has somewhat of a blank stareand at times will close her eyes.  Her skin is warm and dry she is not diaphoretic  Chest she has shallow air entry but I could not really appreciate any overt congestion there is no sign of labored breathing--she  has irregular respiratory rate.  Heart is regular rate and rhythm with occasional irregular beats-- without murmur gallop or rub.  Abdomen soft nontender positive bowel sounds.  Muscle-skeletal has significant weakness of upper t and lower extremities she appears to be declining-her edema appears to be significantly improved lower extremities  Neurologic-p As noted above does not really acknowledge her daughter anymore has a somewhat blank stare into space alternating with closing her eyes  Psych as noted above.    Labs reviewed:  Recent Labs  11/18/15 0702 11/19/15 0704 11/20/15 0920 12/11/15 0500  NA  --  138 136 134*  K  --  4.2 4.6 4.1  CL  --  109 108 102  CO2  --  22 22 20*  GLUCOSE  --  157* 134* 171*  BUN  --  24* 21* 22*  CREATININE  --  1.09* 1.04* 1.29*  CALCIUM  --  8.4* 8.3* 8.8*  MG 1.5*  --   --   --     Recent Labs  11/19/15 0704 11/20/15 0920 12/11/15 0500  AST 61* 38 145*  ALT 74* 54 54  ALKPHOS 138* 145* 327*  BILITOT 1.6* 0.8 2.5*  PROT 6.4* 6.2* 8.2*  ALBUMIN 2.8* 2.6* 3.2*    Recent Labs  11/17/15 2315 11/18/15 0702 11/19/15 0704 11/20/15 0920 12/11/15 0500  WBC 28.7* 19.1* 13.0* 9.6 22.6*  NEUTROABS 25.4* 16.5*  --   --  21.9*  HGB 12.3 11.1* 10.9* 10.4* 12.2  HCT 38.1 34.5* 33.3* 32.3* 37.6  MCV 88.0 88.7 87.9 89.0 85.6  PLT 185 162 152 158 317   Lab Results  Component Value Date   TSH 3.775 04/28/2015   Lab Results  Component Value Date   HGBA1C 6.4* 12/11/2015   Lab Results  Component Value Date   CHOL 157 09/10/2013   HDL 32* 09/10/2013   LDLCALC 90 09/10/2013   TRIG 176* 09/10/2013   CHOLHDL 4.9 09/10/2013    Significant Diagnostic Results in last 30 days:  No results found.  Assessment/Plan  Failure to thrive-end-of-life care-at this point patient appears comfortable I agree with making the morphine routine every 4 hours and every 2 hours when necessary this is 5 mg-she is also on Ativan when necessary as  needed.  Currently not on oxygen this will have to be monitored I do not see signs of any respiratory distress or pain certainly but certainly the Ativan as well as oxygen will have to be available if needed.  I did discuss this extensively with her daughter at bedside who is in agreement and is comfortable at this point with what is happening and is at peace with it  .  [place], PA-C [phone]

## 2016-01-28 DIAGNOSIS — Z515 Encounter for palliative care: Secondary | ICD-10-CM | POA: Insufficient documentation

## 2016-02-01 DEATH — deceased

## 2016-04-14 IMAGING — CR DG CHEST 1V PORT
1 series · 1 of 1 positions shown · non-contrast
Comparison: 04/15/2014.

CLINICAL DATA: Pneumonia.

EXAM:
PORTABLE CHEST - 1 VIEW

[portable]
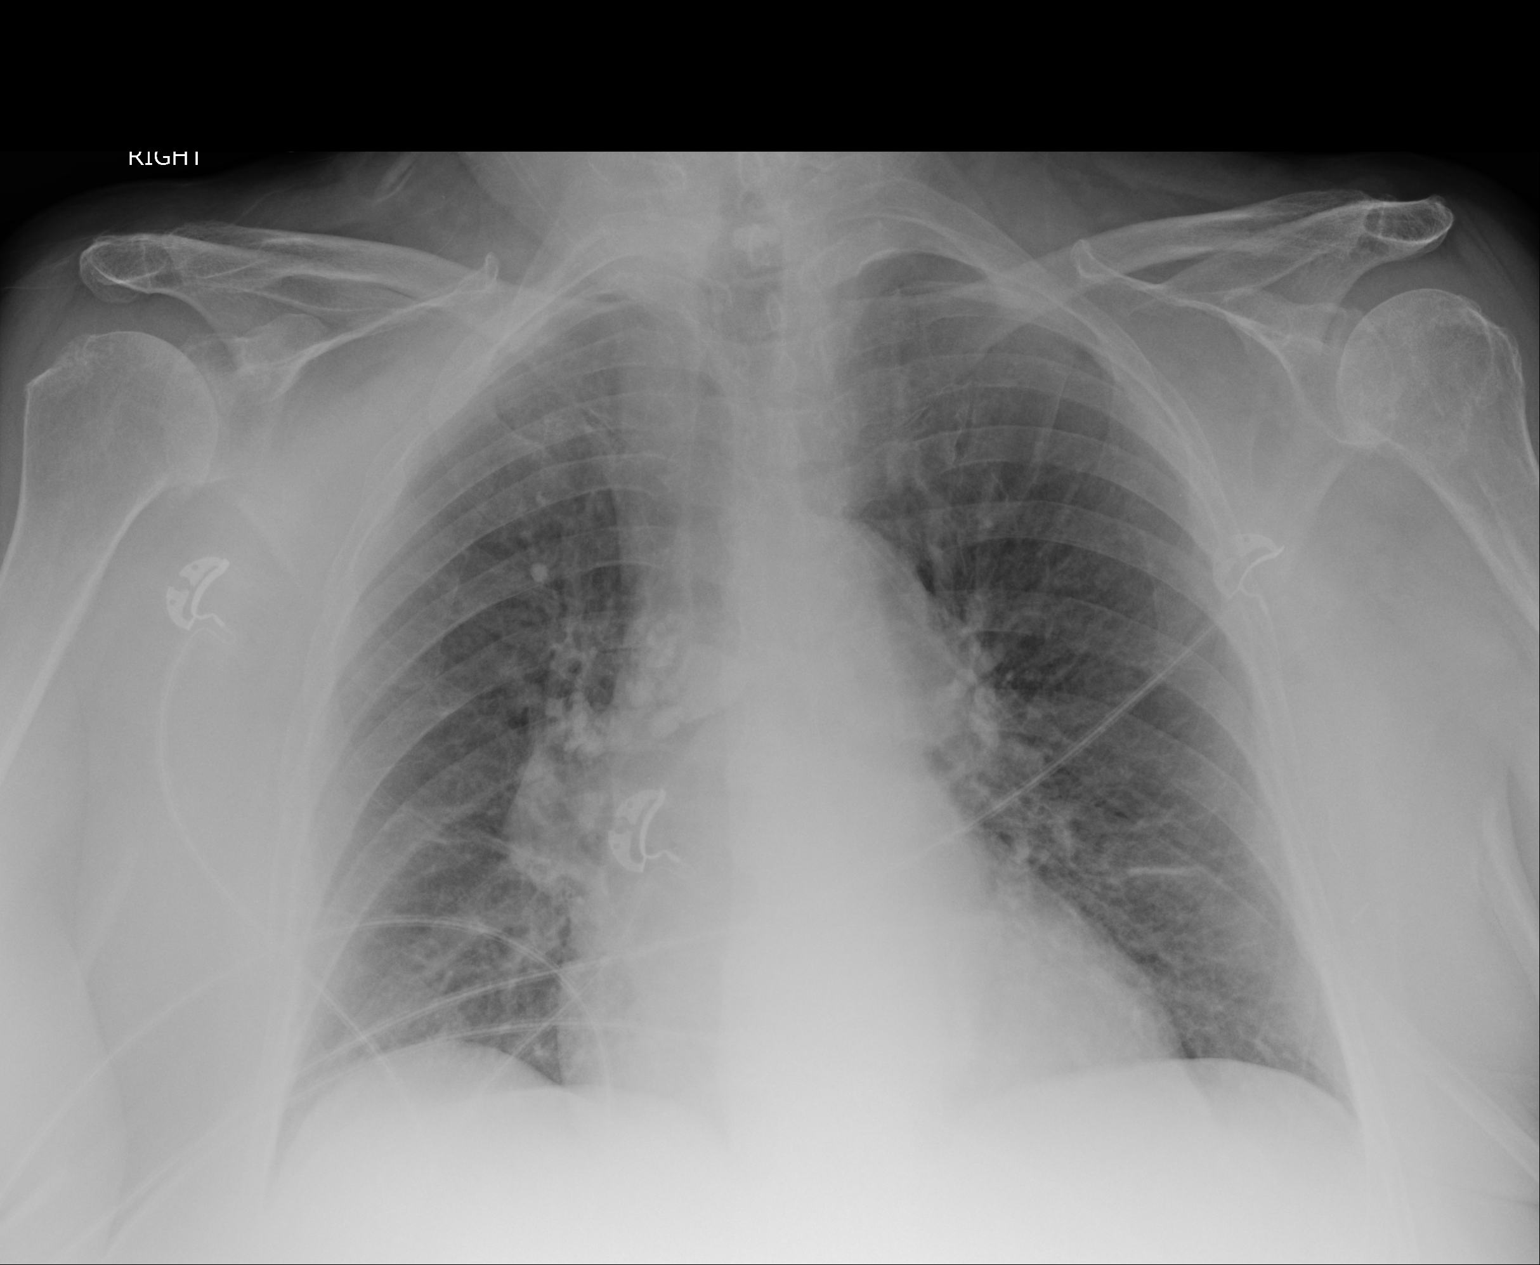

[1 of 1 positions shown; findings below may reference images not displayed]

FINDINGS: Low lung volumes with mild vascular congestion. Platelike scarring
or atelectasis LEFT midlung zone. Unchanged calcified hilar and
mediastinal and lymph nodes. Increased cardiomediastinal silhouette.
IMPRESSION: Little change from prior radiograph. No focal areas of consolidation
to suggest pneumonia. Mild vascular congestion.

## 2016-09-15 IMAGING — CR DG CHEST 2V
2 series · 2 of 2 positions shown · non-contrast
Comparison: None.

CLINICAL DATA: Both cough, shortness of breath, history
hypertension, breast cancer, asthma, type 2 diabetes, chronic
diastolic heart failure 07/20/2014

EXAM:
CHEST  2 VIEW

[view not recorded (1 of 2)]
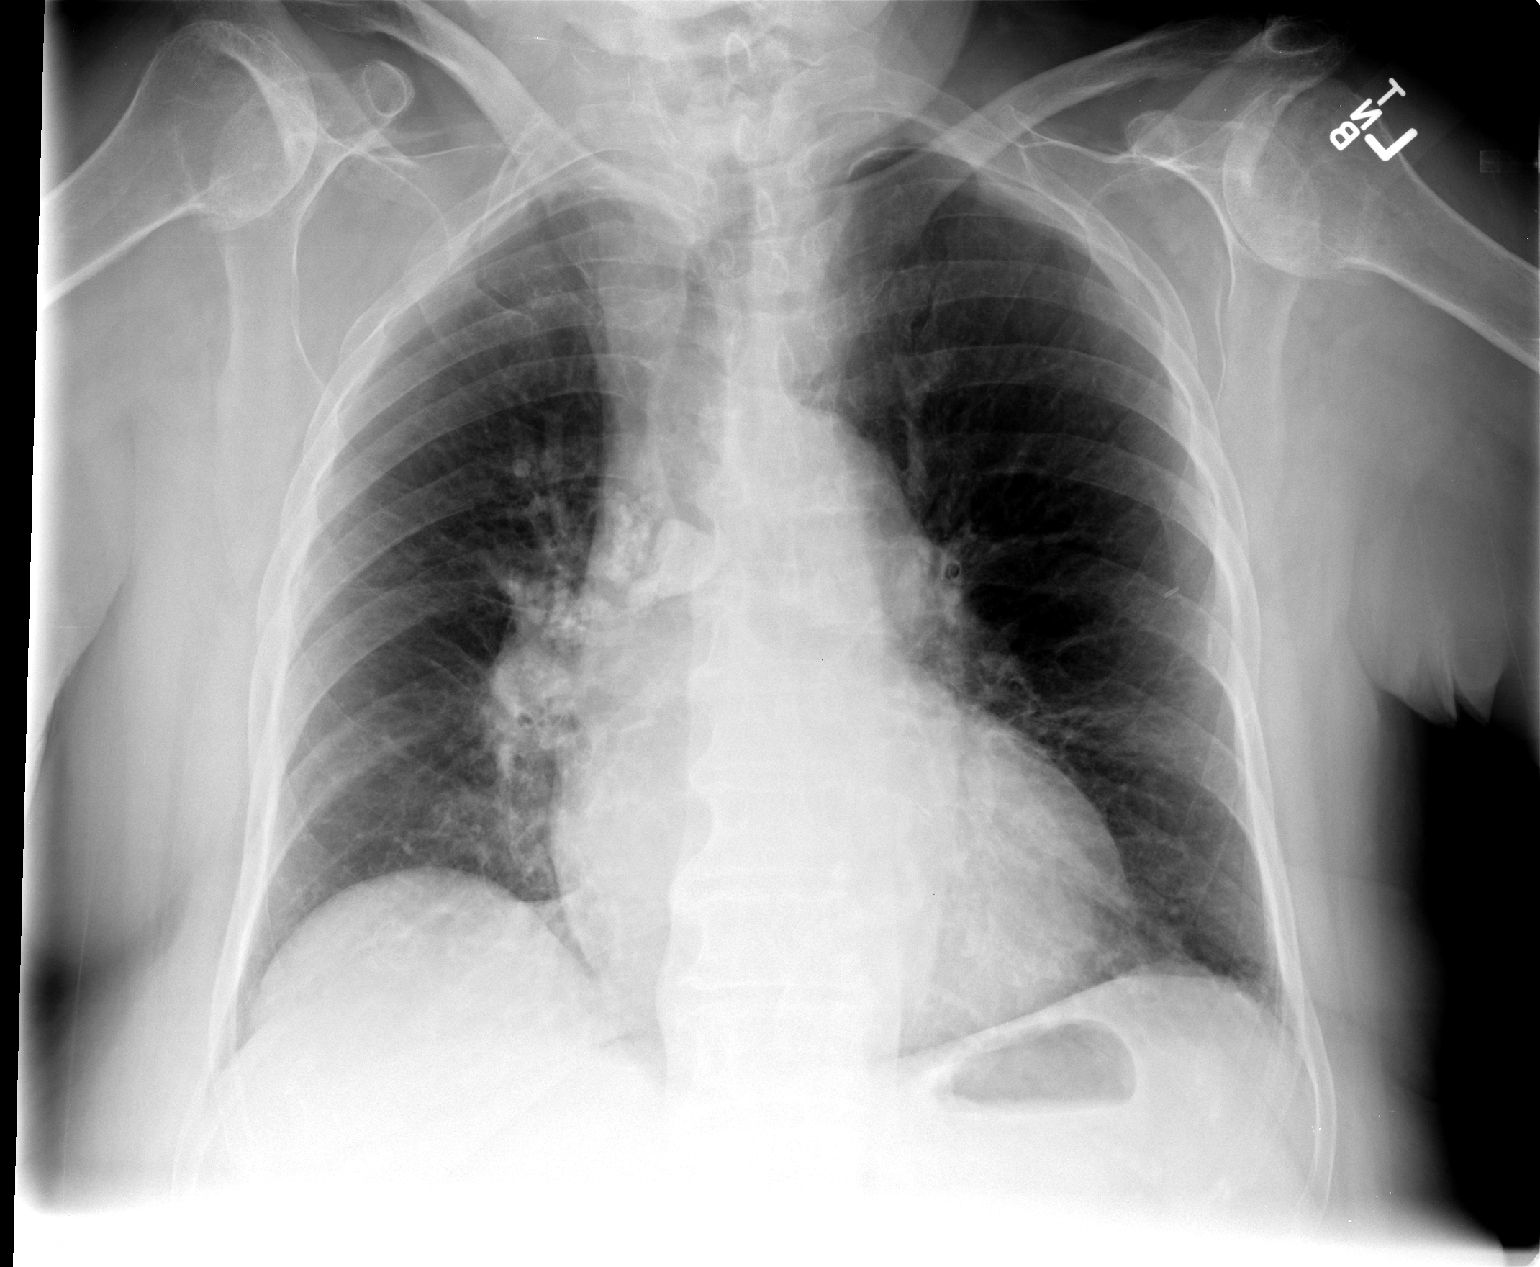

[view not recorded (2 of 2)]
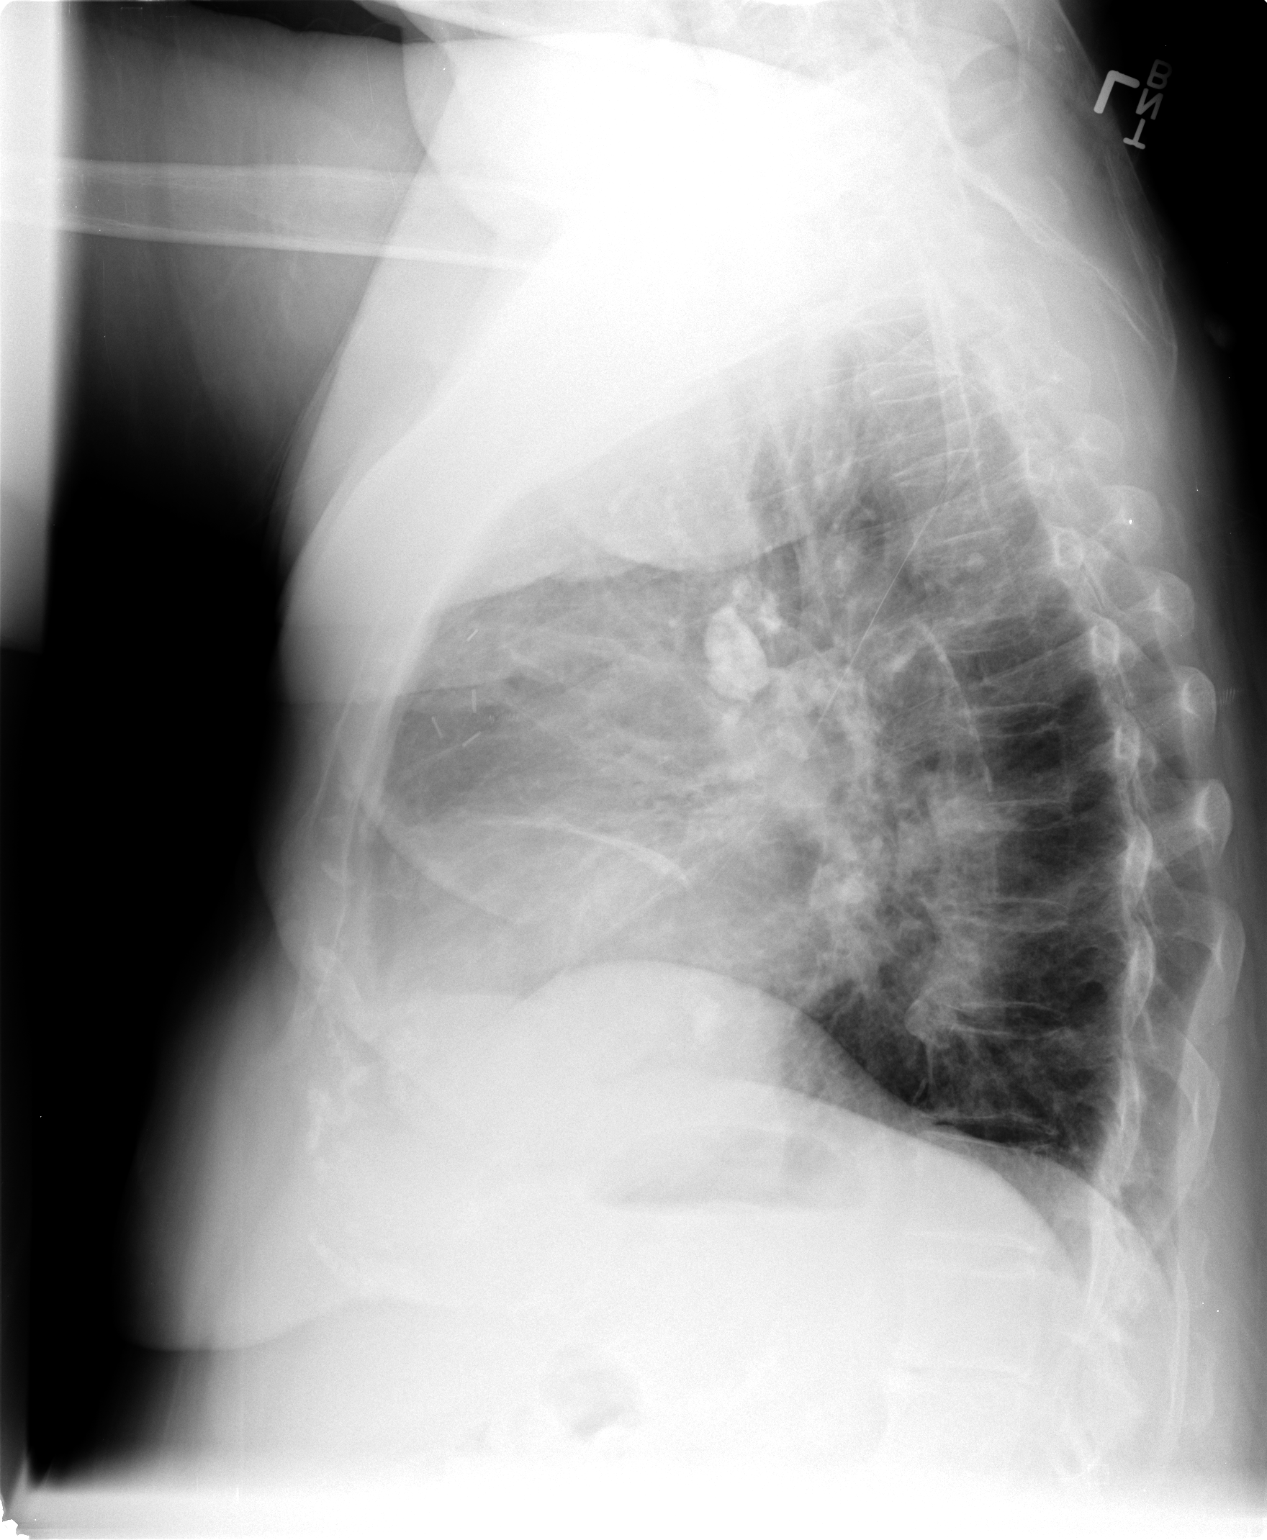

[2 of 2 positions shown; findings below may reference images not displayed]

FINDINGS: Enlargement of cardiac silhouette.

Tortuosity of thoracic aorta.

Stable mediastinal contours.

Calcified adenopathy at RIGHT hilum and RIGHT peritracheal.

Calcified granuloma RIGHT upper lobe.

Linear scarring at anterior lung bases on lateral view unchanged
since 05/06/2014

Bronchitic changes without infiltrate, pleural effusion or
pneumothorax.

Bones demineralized with BILATERAL glenohumeral degenerative
changes.
IMPRESSION: Old granulomatous disease and anterior basilar scarring.

Enlargement of cardiac silhouette.

No acute abnormalities.

## 2016-11-12 IMAGING — MR MR HEAD W/O CM
7 of 10 series · 22 of 48 positions shown · non-contrast
Comparison: Head CT 11/13/2014 and previous

CLINICAL DATA: Altered mental status. Confusion. Personal history
of breast cancer.

EXAM:
MRI HEAD WITHOUT CONTRAST
TECHNIQUE: Multiplanar, multiecho pulse sequences of the brain and surrounding
structures were obtained without intravenous contrast.

[Series 3: T1 · sagittal · 5.0mm · 0.43mm/px · 1 of 21 slices shown (1 of 2)]
[im 1/21]
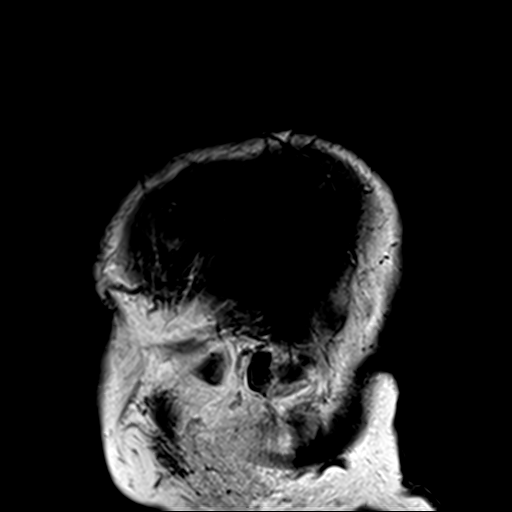

[Series 6: T2 · axial · 5.0mm · 0.47mm/px · z∈[-17,+123]mm · 3 of 23 slices shown (1 of 2)]
[im 1/23]
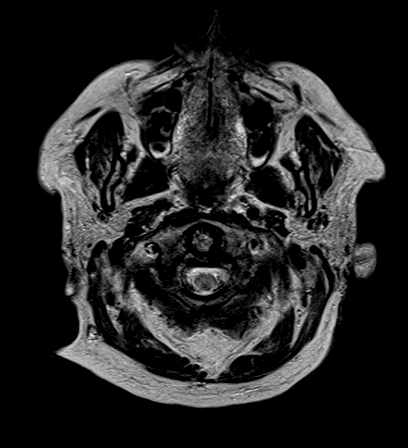
[im 12/23]
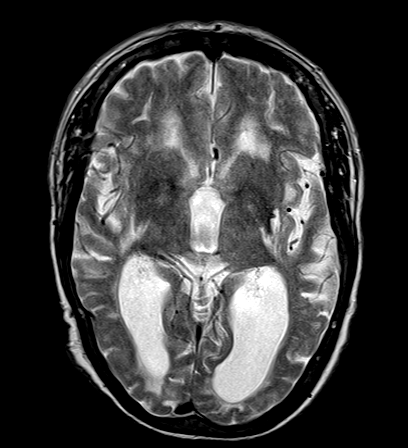
[im 23/23]
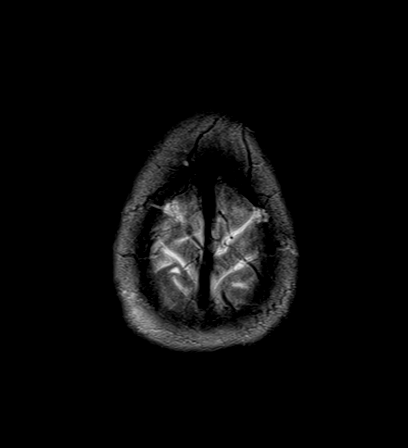

[Series 7: FLAIR · axial · 5.0mm · 0.34mm/px · z∈[-17,+123]mm · 3 of 23 slices shown]
[im 1/23]
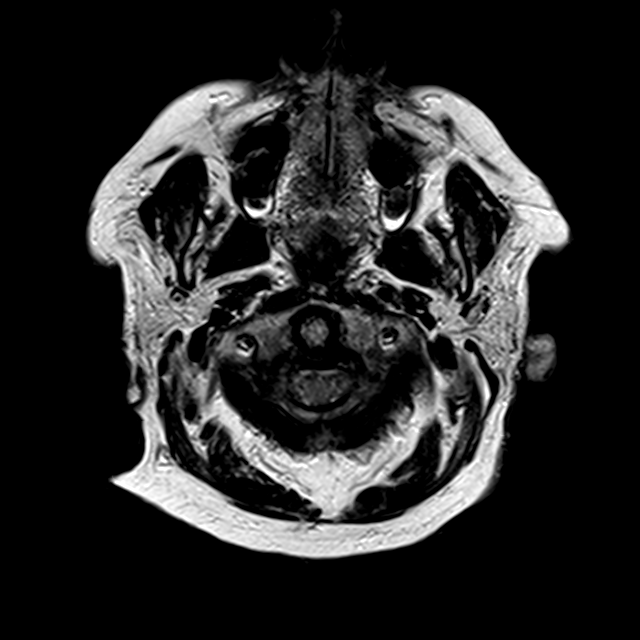
[im 12/23]
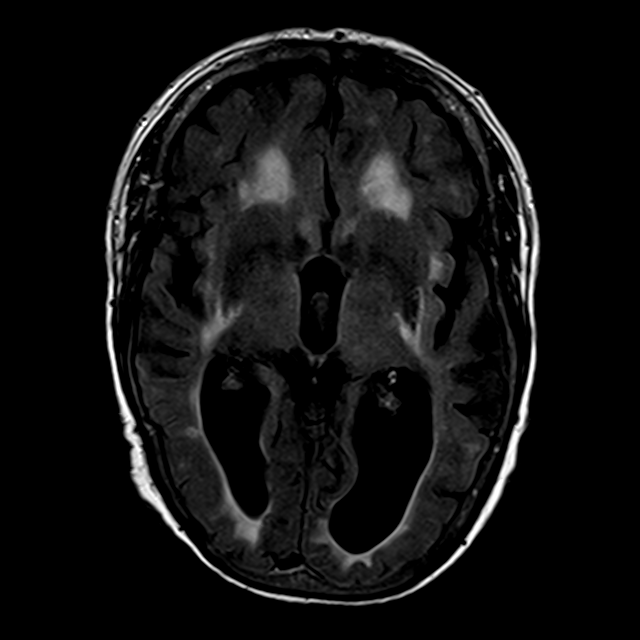
[im 23/23]
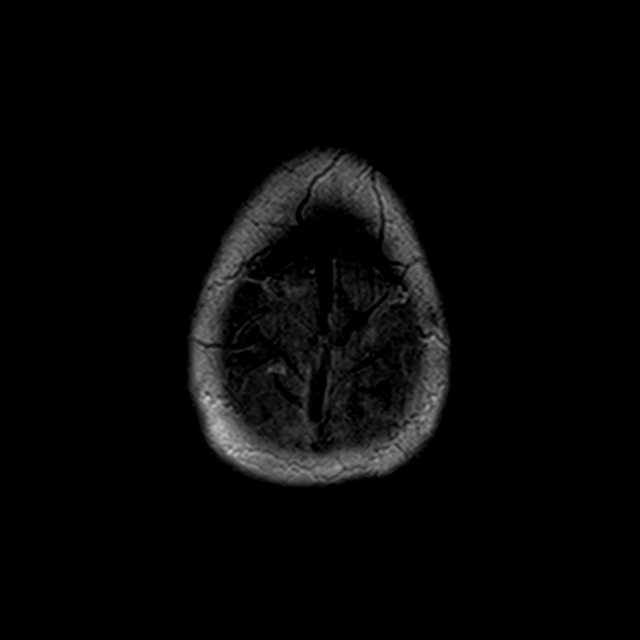

[Series 8: T1 · axial · 2.0mm · 0.42mm/px · z∈[-19,+134]mm · 8 of 79 slices shown (2 of 2)]
[im 1/79]
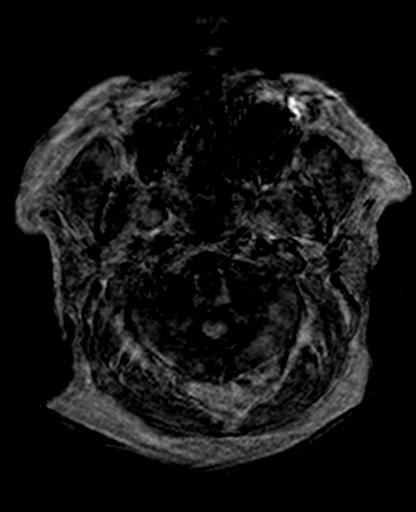
[im 10/79]
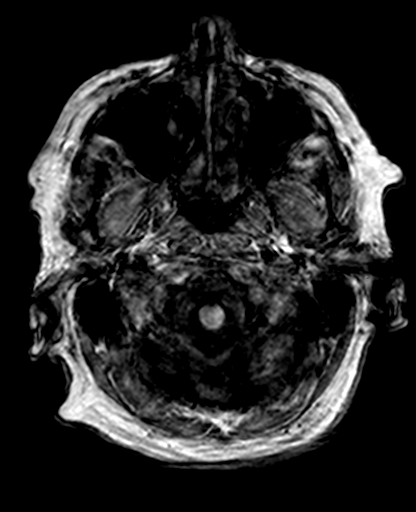
[im 20/79]
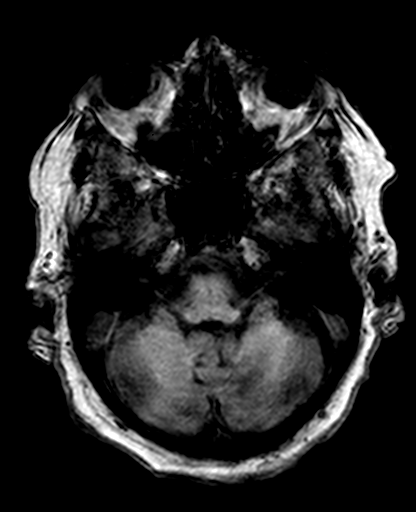
[im 30/79]
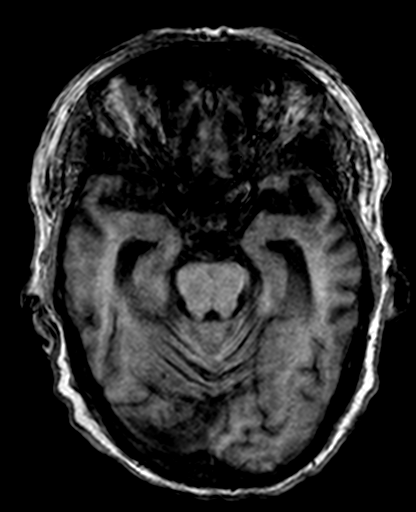
[im 49/79]
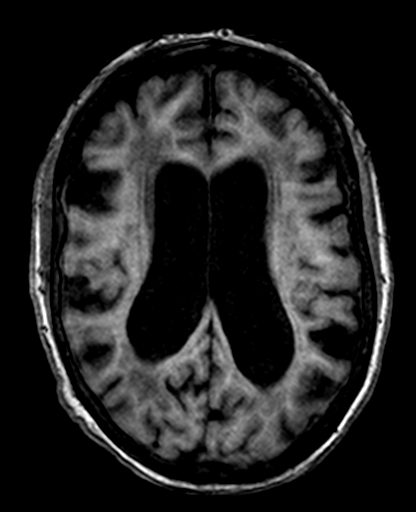
[im 59/79]
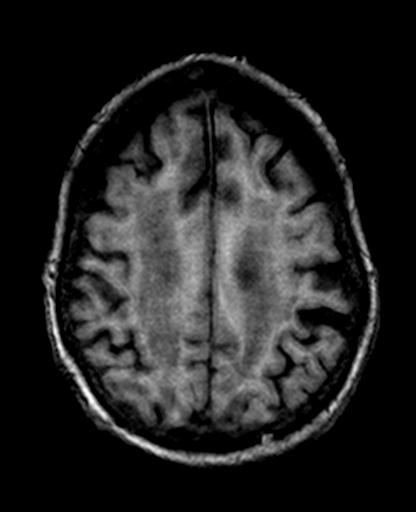
[im 69/79]
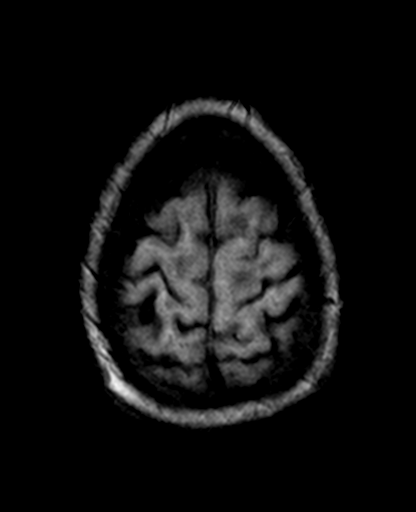
[im 79/79]
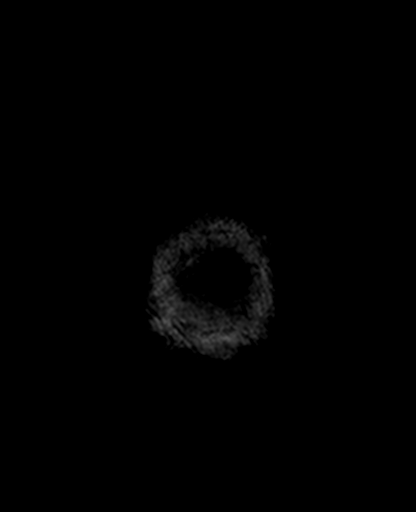

[Series 9: trauma axial · axial · 5.0mm · 0.40mm/px · z∈[-16,+124]mm · 3 of 23 slices shown]
[im 1/23]
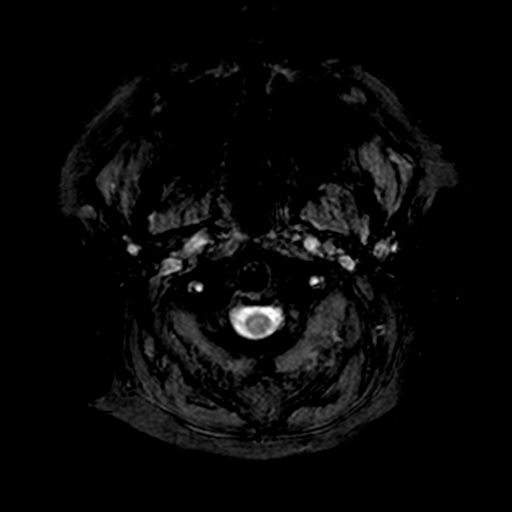
[im 12/23]
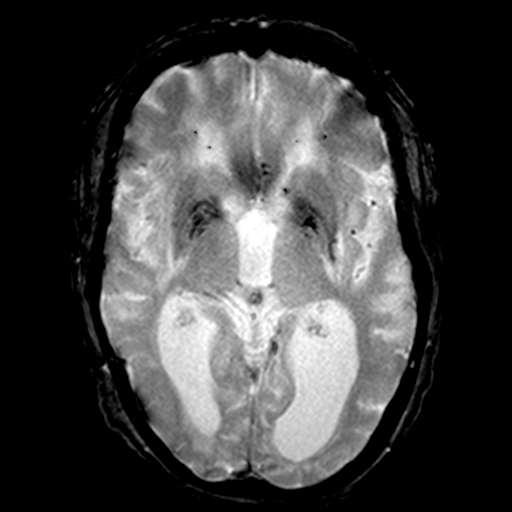
[im 23/23]
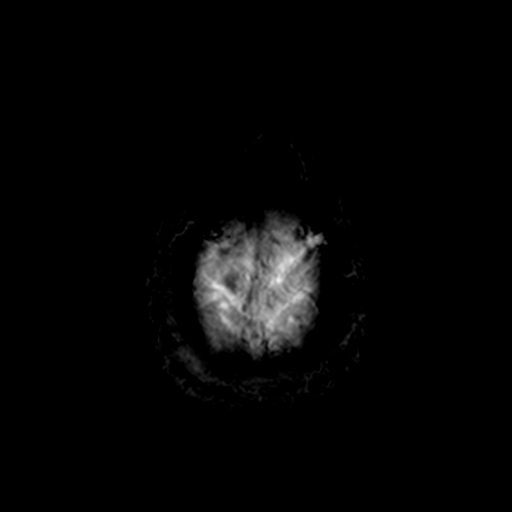

[Series 10: T2 · coronal · 5.0mm · 0.41mm/px · 3 of 28 slices shown (2 of 2)]
[im 1/28]
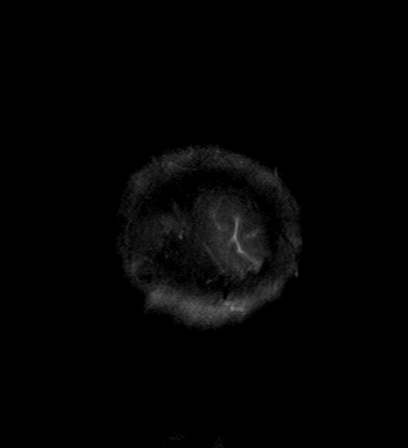
[im 14/28]
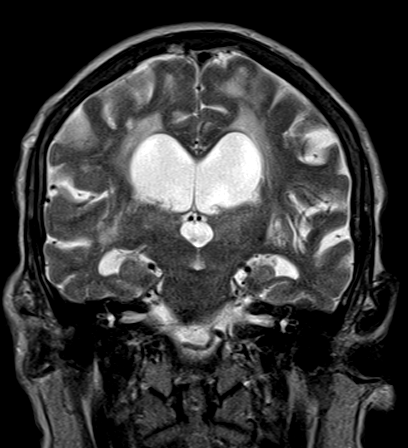
[im 28/28]
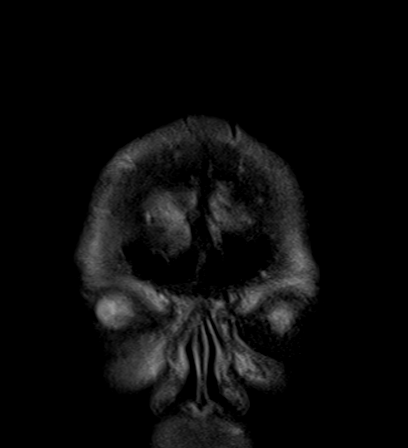

[Series 100: <mpr thick range> · axial · 3.0mm · 0.73mm/px · 1 of 50 slices shown]
[im 1/50]
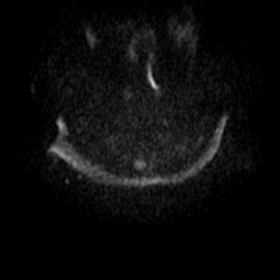

[22 of 48 positions shown; findings below may reference images not displayed]

FINDINGS: There is a background pattern of advanced generalized brain atrophy.
There is chronic ventriculomegaly related to deep white matter
volume loss. There are multiple old infarctions throughout the
brain. Chronic small-vessel changes are present throughout the pons.
There is an old inferior cerebellar infarction on the left. There
are extensive chronic small vessel ischemic changes throughout the
hemispheric deep and subcortical white matter. There are small
cortical and subcortical infarctions in the right frontal region in
the left occipital region. There are chronic small-vessel ischemic
changes affecting the thalami, basal ganglia and major white matter
tracts. There is an acute 4 mm infarction in the left anterior body
of the caudate. No other acute infarction. No evidence of mass
lesion, hemorrhage or extra-axial collection. No pituitary mass. No
inflammatory sinus disease. No skull or skullbase lesion.
IMPRESSION: 4 mm acute infarction in the left anterior body of the caudate. No
other acute infarction.

Background pattern of advanced brain atrophy and widespread chronic
ischemic changes as outlined above.

Ventriculomegaly, consistent with central atrophy and in proportion
to the degree of generalized brain volume loss.
